# Patient Record
Sex: Female | Born: 1950 | Race: Black or African American | Hispanic: No | Smoking: Former smoker
Health system: Southern US, Community
[De-identification: ages and names within clinical notes are randomized; demographics above are authoritative.]

## PROBLEM LIST (undated history)

## (undated) DIAGNOSIS — M810 Age-related osteoporosis without current pathological fracture: Secondary | ICD-10-CM

## (undated) DIAGNOSIS — F329 Major depressive disorder, single episode, unspecified: Secondary | ICD-10-CM

## (undated) DIAGNOSIS — R29898 Other symptoms and signs involving the musculoskeletal system: Secondary | ICD-10-CM

## (undated) DIAGNOSIS — G8929 Other chronic pain: Secondary | ICD-10-CM

## (undated) DIAGNOSIS — N183 Chronic kidney disease, stage 3 unspecified: Secondary | ICD-10-CM

## (undated) DIAGNOSIS — D472 Monoclonal gammopathy: Secondary | ICD-10-CM

## (undated) DIAGNOSIS — R569 Unspecified convulsions: Secondary | ICD-10-CM

## (undated) DIAGNOSIS — R51 Headache: Secondary | ICD-10-CM

## (undated) DIAGNOSIS — J069 Acute upper respiratory infection, unspecified: Secondary | ICD-10-CM

## (undated) DIAGNOSIS — I10 Essential (primary) hypertension: Secondary | ICD-10-CM

## (undated) DIAGNOSIS — F32A Depression, unspecified: Secondary | ICD-10-CM

## (undated) DIAGNOSIS — J984 Other disorders of lung: Secondary | ICD-10-CM

## (undated) DIAGNOSIS — E785 Hyperlipidemia, unspecified: Secondary | ICD-10-CM

## (undated) DIAGNOSIS — G9340 Encephalopathy, unspecified: Secondary | ICD-10-CM

## (undated) HISTORY — DX: Monoclonal gammopathy: D47.2

## (undated) HISTORY — DX: Other chronic pain: G89.29

## (undated) HISTORY — PX: REDUCTION MAMMAPLASTY: SUR839

## (undated) HISTORY — PX: CHOLECYSTECTOMY: SHX55

## (undated) HISTORY — PX: BREAST REDUCTION SURGERY: SHX8

## (undated) HISTORY — DX: Depression, unspecified: F32.A

## (undated) HISTORY — DX: Headache: R51

## (undated) HISTORY — PX: LUNG LOBECTOMY: SHX167

## (undated) HISTORY — DX: Major depressive disorder, single episode, unspecified: F32.9

## (undated) HISTORY — DX: Acute upper respiratory infection, unspecified: J06.9

## (undated) HISTORY — PX: ABDOMINAL HYSTERECTOMY: SHX81

## (undated) HISTORY — DX: Age-related osteoporosis without current pathological fracture: M81.0

## (undated) HISTORY — PX: APPENDECTOMY: SHX54

## (undated) HISTORY — DX: Unspecified convulsions: R56.9

---

## 2000-11-28 HISTORY — PX: BACK SURGERY: SHX140

## 2010-03-04 ENCOUNTER — Encounter: Payer: Self-pay | Admitting: Cardiology

## 2010-04-12 ENCOUNTER — Encounter: Payer: Self-pay | Admitting: Cardiology

## 2010-04-15 ENCOUNTER — Encounter (INDEPENDENT_AMBULATORY_CARE_PROVIDER_SITE_OTHER): Payer: Self-pay | Admitting: *Deleted

## 2010-04-21 ENCOUNTER — Ambulatory Visit: Payer: Self-pay | Admitting: Cardiology

## 2010-04-21 DIAGNOSIS — R0600 Dyspnea, unspecified: Secondary | ICD-10-CM | POA: Insufficient documentation

## 2010-04-21 DIAGNOSIS — R079 Chest pain, unspecified: Secondary | ICD-10-CM | POA: Insufficient documentation

## 2010-04-21 DIAGNOSIS — I10 Essential (primary) hypertension: Secondary | ICD-10-CM | POA: Insufficient documentation

## 2010-04-21 DIAGNOSIS — R0609 Other forms of dyspnea: Secondary | ICD-10-CM

## 2010-04-22 ENCOUNTER — Telehealth (INDEPENDENT_AMBULATORY_CARE_PROVIDER_SITE_OTHER): Payer: Self-pay | Admitting: *Deleted

## 2010-04-22 LAB — CONVERTED CEMR LAB
BUN: 25 mg/dL — ABNORMAL HIGH (ref 6–23)
CO2: 19 meq/L (ref 19–32)
Calcium: 9.4 mg/dL (ref 8.4–10.5)
Chloride: 108 meq/L (ref 96–112)
Creatinine, Ser: 1.6 mg/dL — ABNORMAL HIGH (ref 0.4–1.2)
GFR calc non Af Amer: 35.1 mL/min (ref >60)
Glucose, Bld: 102 mg/dL — ABNORMAL HIGH (ref 70–99)
Potassium: 4.9 meq/L (ref 3.5–5.1)
Sodium: 136 meq/L (ref 135–145)

## 2010-04-27 LAB — CONVERTED CEMR LAB: Pro B Natriuretic peptide (BNP): 18.8 pg/mL (ref 0.0–100.0)

## 2010-04-30 ENCOUNTER — Ambulatory Visit: Payer: Self-pay | Admitting: Cardiology

## 2010-04-30 LAB — CONVERTED CEMR LAB
BUN: 27 mg/dL — ABNORMAL HIGH (ref 6–23)
CO2: 25 meq/L (ref 19–32)
Calcium: 9.2 mg/dL (ref 8.4–10.5)
Chloride: 109 meq/L (ref 96–112)
Creatinine, Ser: 1.3 mg/dL — ABNORMAL HIGH (ref 0.4–1.2)
GFR calc non Af Amer: 43.07 mL/min (ref >60)
Glucose, Bld: 85 mg/dL (ref 70–99)
Potassium: 4.7 meq/L (ref 3.5–5.1)
Sodium: 142 meq/L (ref 135–145)

## 2010-05-26 ENCOUNTER — Encounter (INDEPENDENT_AMBULATORY_CARE_PROVIDER_SITE_OTHER): Payer: Self-pay | Admitting: *Deleted

## 2010-12-19 ENCOUNTER — Encounter: Payer: Self-pay | Admitting: Cardiovascular Disease

## 2010-12-28 NOTE — Letter (Signed)
Summary: New Patient letter  [facility]  [place], [place] [address]   Phone: [phone]  Fax: [phone]       04/15/2010 MRN: [id number]  [place], [place]  [address]  Dear Ms. CLAYBROOK,  Welcome to the Gastroenterology Division at [facility].    You are scheduled to see Dr.  Ardis Hughs on  05-28-10 at [place] on the 3rd floor at [facility], [place] [employer].  We ask that you try to arrive at our office 15 minutes prior to your appointment time to allow for check-in.  We would like you to complete the enclosed self-administered evaluation form prior to your visit and bring it with you on the day of your appointment.  We will review it with you.  Also, please bring a complete list of all your medications or, if you prefer, bring the medication bottles and we will list them.  Please bring your insurance card so that we may make a copy of it.  If your insurance requires a referral to see a specialist, please bring your referral form from your primary care physician.  Co-payments are due at the time of your visit and may be paid by cash, check or credit card.     Your office visit will consist of a consult with your physician (includes a physical exam), any laboratory testing he/she may order, scheduling of any necessary diagnostic testing (e.g. x-ray, ultrasound, CT-scan), and scheduling of a procedure (e.g. Endoscopy, Colonoscopy) if required.  Please allow enough time on your schedule to allow for any/all of these possibilities.    If you cannot keep your appointment, please call [phone] to cancel or reschedule prior to your appointment date.  This allows [place] the opportunity to schedule an appointment for another patient in need of care.  If you do not cancel or reschedule by 5 p.m. the business day prior to your appointment date, you will be charged a $50.00 late cancellation/no-show fee.    Thank you for choosing [facility] for your medical needs.  We appreciate the opportunity to care for you.  Please visit [place] at our website  to learn more about our practice.                     Sincerely,                                                             The Gastroenterology Division

## 2010-12-28 NOTE — Progress Notes (Signed)
  Phone Note Outgoing Call   Call placed by: Joelyn Oms RN,  Apr 22, 2010 5:05 PM Call placed to: Patient Details for Reason: lab results  Follow-up for Phone Call        I spoke with Ms. [name] about lab results and Dr. Aundra Dubin orders to decrease Chlorthalidone to 25mg  daily.  She will repeat a BMET on Friday 6/3. Joelyn Oms RN

## 2010-12-28 NOTE — Assessment & Plan Note (Signed)
Summary: np6/angina/jml   Referring Provider:  Ronnald Collum Primary Provider:  Mercie Eon  CC:  new patient with chest pain and heaviness on chest.  .  History of Present Illness: 60 yo with history of HTN, hyperlipidemia, and CVA presented for evaluation of chest heaviness and exertional dyspnea.  Patient gets episodes of chest heaviness 3-4 times a week for the last 2 years.  It will "hit" her at random times though often is related to emotional stress.  Sometimes occurs while she is exerting herself but also sometimes occurs at rest.  Heaviness will resolve after 2-3 minutes of relaxation.  It is not associated with meals.  Paitent also has had significant dyspnea with exertion for around 2 years.  She gets some shortness of breath walking around her house.  She is very short of breath with steps.  She may have some orthopnea as she is not comfortable lying flat on her back (sleeps on side with head raised).  She does have some residual right-sided weakness from her stroke so she walks with a cane.   ECG: NSR, possible LAE  Labs (11/10): LDL 53, HDL 76  Current Medications (verified): 1)  Chlorthalidone 50 Mg Tabs (Chlorthalidone) .... Take One Tablet Once Daily 2)  Plavix 75 Mg Tabs (Clopidogrel Bisulfate) .... Once Daily 3)  Zolpidem Tartrate 10 Mg Tabs (Zolpidem Tartrate) .... Take One Tablet Once Daily 4)  Pantoprazole Sodium 40 Mg Tbec (Pantoprazole Sodium) .... Once Daily 5)  Simvastatin 10 Mg Tabs (Simvastatin) .... Once Daily 6)  Lorazepam 1 Mg Tabs (Lorazepam) .... Take One Tablet To 3 Tablets Daily As Needed For Anxiety 7)  Promethazine Hcl 25 Mg Tabs (Promethazine Hcl) .... As Needed For Nausea 8)  Aspirin 81 Mg Tabs (Aspirin) .... Once Daily 9)  Amlodipine Besylate 5 Mg Tabs (Amlodipine Besylate) .... Once Daily 10)  Vit D-2 1.25 Mg .... One Capsule Twice A Week 11)  Levetiracetam 250 Mg Tabs (Levetiracetam) .... Take One Tablet Two Times A Day 12)  Cymbalta 60 Mg Cpep  (Duloxetine Hcl) .... Take One Capsule Once Daily  Allergies (verified): 1)  ! Sulfa 2)  ! Pcn  Past History:  Past Medical History: 1. HTN 2. Seizures 3. Hyperlipidemia 4. Depression 5. CVA: Residual right-sided weakness 6. GERD 7. Partial right lung resection in 1978 in [place] for "cyst in lung."  8. History of back surgery, chronic back pain  Family History: CAD in mother (PCI in her 68s), father with "heart problems"  Social History: Disabled former Psychologist, counselling.  Quit tobacco around 2007 (1 ppd before).  Lives with her aunt.    Review of Systems       All systems reviewed and negative except as per HPI.   Vital Signs:  Patient profile:   60 year old female Height:      67 inches Weight:      209 pounds BMI:     32.85 Pulse rate:   93 / minute Pulse rhythm:   regular BP sitting:   124 / 88  (left arm) Cuff size:   regular  Vitals Entered By: Doug Sou CMA (Apr 21, 2010 3:00 PM)  Physical Exam  General:  Well developed, well nourished, in no acute distress. Head:  normocephalic and atraumatic Nose:  no deformity, discharge, inflammation, or lesions Mouth:  Teeth, gums and palate normal. Oral mucosa normal. Neck:  Neck supple, no JVD. No masses, thyromegaly or abnormal cervical nodes. Lungs:  Clear bilaterally to auscultation and  percussion. Heart:  Non-displaced PMI, chest non-tender; regular rate and rhythm, S1, S2 without rubs or gallops. 1/6 SEM upper sternal border.  Carotid upstroke normal, no bruit.  1+ DP on right, trace DP on left. No edema, no varicosities. Abdomen:  Bowel sounds positive; abdomen soft and non-tender without masses, organomegaly, or hernias noted. No hepatosplenomegaly. Extremities:  No clubbing or cyanosis. Neurologic:  Alert and oriented x 3. Skin:  Intact without lesions or rashes. Psych:  Normal affect.   Impression & Recommendations:  Problem # 1:  CHEST PAIN-UNSPECIFIED (ICD-786.50) Patient has atypical chest  pain that has been a chronic pattern.  She does have quite significant exertional dyspnea.  She has a number of risk factors for CAD including known vascular disease (history of CVA).  I will enroll her in the PROMISE study for coronary CTA or Lexiscan myoview.  Continue Plavix, ASA and statin given known vascular disease (history of stroke).    Problem # 2:  SHORTNESS OF BREATH (ICD-786.05) Patient gets exertional dyspnea with moderate activity.  She does not appear volume overloaded on exam.  ? obesity/deconditioning.  I will get an echocardiogram to assess LV systolic function and to look at the heart valves.  I will get a BNP.   Problem # 3:  HYPERTENSION, UNSPECIFIED (ICD-401.9) Good control.   Other Orders: Echocardiogram (Echo) TLB-BNP (B-Natriuretic Peptide) (83880-BNPR) Cardiac CTA (Cardiac CTA) TLB-BMP (Basic Metabolic Panel-BMET) (99991111)  Patient Instructions: 1)  Research Nurse will talk with you today about the Promise Study. 2)  Your physician has requested that you have an echocardiogram.  Echocardiography is a painless test that uses sound waves to create images of your heart. It provides your doctor with information about the size and shape of your heart and how well your heart's chambers and valves are working.  This procedure takes approximately one hour. There are no restrictions for this procedure. 3)  Your physician recommends that have lab today---BNP 786.09 4)  Your physician recommends that you schedule a follow-up appointment in about 3 weeks with Dr Aundra Dubin.   Appended Document: np6/angina/jml per Dr Orpah Greek to take Metoprolol 25mg  the night before the CCTA and 50mg  the morning of the CCTA--   Clinical Lists Changes  Medications: Added new medication of METOPROLOL TARTRATE 25 MG TABS (METOPROLOL TARTRATE) one tablet the night before the CTA and  two tablets the morning of the CTA - Signed Rx of METOPROLOL TARTRATE 25 MG TABS (METOPROLOL TARTRATE) one  tablet the night before the CTA and  two tablets the morning of the CTA;  #3 x 0;  Signed;  Entered by: Desiree Lucy, RN, BSN;  Authorized by: Loralie Champagne, MD;  Method used: Electronically to [facility] [place] [address], [place], [place]  [address], Ph: [phone] or [phone], Fax: [phone]    Prescriptions: METOPROLOL TARTRATE 25 MG TABS (METOPROLOL TARTRATE) one tablet the night before the CTA and  two tablets the morning of the CTA  #3 x 0   Entered by:   Desiree Lucy, RN, BSN   Authorized by:   Loralie Champagne, MD   Signed by:   Desiree Lucy, RN, BSN on 04/27/2010   Method used:   Electronically to        [facility] (retail)       [address]. [place], [place]  [address]       Ph: [phone] or [phone]       Fax: [phone]  [id number]

## 2010-12-28 NOTE — Progress Notes (Signed)
Summary: [name] Family Office Note  [facility] Family Office Note   Imported By: Sallee Provencal 05/07/2010 12:16:52  _____________________________________________________________________  External Attachment:    Type:   Image     Comment:   External Document

## 2010-12-28 NOTE — Letter (Signed)
Summary: New Patient letter  [facility]  [place], [place] [address]   Phone: [phone]  Fax: [phone]       05/26/2010 MRN: [id number]  [name] [place], [place]  [address]  Dear Ms. CLAYBROOK,  Welcome to the Gastroenterology Division at [facility].    You are scheduled to see Dr.  Ardis Hughs on 07-06-10 at 2:00p.m. on the 3rd floor at [facility], [place] [employer].  We ask that you try to arrive at our office 15 minutes prior to your appointment time to allow for check-in.  We would like you to complete the enclosed self-administered evaluation form prior to your visit and bring it with you on the day of your appointment.  We will review it with you.  Also, please bring a complete list of all your medications or, if you prefer, bring the medication bottles and we will list them.  Please bring your insurance card so that we may make a copy of it.  If your insurance requires a referral to see a specialist, please bring your referral form from your primary care physician.  Co-payments are due at the time of your visit and may be paid by cash, check or credit card.     Your office visit will consist of a consult with your physician (includes a physical exam), any laboratory testing he/she may order, scheduling of any necessary diagnostic testing (e.g. x-ray, ultrasound, CT-scan), and scheduling of a procedure (e.g. Endoscopy, Colonoscopy) if required.  Please allow enough time on your schedule to allow for any/all of these possibilities.    If you cannot keep your appointment, please call [phone] to cancel or reschedule prior to your appointment date.  This allows [place] the opportunity to schedule an appointment for another patient in need of care.  If you do not cancel or reschedule by 5 p.m. the business day prior to your appointment date, you will be charged a $50.00 late cancellation/no-show fee.    Thank you for choosing  [facility] for your medical needs.  We appreciate the opportunity to care for you.  Please visit [place] at our website  to learn more about our practice.                     Sincerely,                                                             The Gastroenterology Division

## 2012-01-31 ENCOUNTER — Ambulatory Visit: Payer: Medicare Other | Attending: Family Medicine | Admitting: Physical Therapy

## 2012-06-13 ENCOUNTER — Encounter: Payer: Self-pay | Admitting: Gastroenterology

## 2012-07-17 ENCOUNTER — Telehealth: Payer: Self-pay | Admitting: *Deleted

## 2012-07-17 NOTE — Telephone Encounter (Signed)
Pt no show for previsit.  No answer on home number.  Sent no show letter.

## 2012-07-26 ENCOUNTER — Encounter: Payer: Self-pay | Admitting: Gastroenterology

## 2012-07-26 ENCOUNTER — Other Ambulatory Visit: Payer: Self-pay | Admitting: Family Medicine

## 2012-07-26 DIAGNOSIS — N289 Disorder of kidney and ureter, unspecified: Secondary | ICD-10-CM

## 2012-07-31 ENCOUNTER — Ambulatory Visit (HOSPITAL_COMMUNITY)
Admission: RE | Admit: 2012-07-31 | Discharge: 2012-07-31 | Disposition: A | Discharge status: Home / Self Care | Payer: Medicare Other | Source: Ambulatory Visit | Attending: Family Medicine | Admitting: Family Medicine

## 2012-07-31 DIAGNOSIS — N289 Disorder of kidney and ureter, unspecified: Secondary | ICD-10-CM

## 2012-11-01 ENCOUNTER — Other Ambulatory Visit: Payer: Self-pay | Admitting: Family Medicine

## 2012-11-01 DIAGNOSIS — Z9889 Other specified postprocedural states: Secondary | ICD-10-CM

## 2012-11-01 DIAGNOSIS — L905 Scar conditions and fibrosis of skin: Secondary | ICD-10-CM

## 2012-11-05 ENCOUNTER — Encounter (HOSPITAL_COMMUNITY): Payer: Medicare Other | Attending: Oncology | Admitting: Oncology

## 2012-11-05 ENCOUNTER — Encounter (HOSPITAL_COMMUNITY): Payer: Self-pay | Admitting: Oncology

## 2012-11-05 VITALS — BP 116/78 | HR 58 | Temp 98.1°F | Resp 20 | Ht 66.0 in | Wt 185.0 lb

## 2012-11-05 DIAGNOSIS — I10 Essential (primary) hypertension: Secondary | ICD-10-CM | POA: Insufficient documentation

## 2012-11-05 DIAGNOSIS — E7800 Pure hypercholesterolemia, unspecified: Secondary | ICD-10-CM | POA: Insufficient documentation

## 2012-11-05 DIAGNOSIS — R803 Bence Jones proteinuria: Secondary | ICD-10-CM

## 2012-11-05 DIAGNOSIS — G40909 Epilepsy, unspecified, not intractable, without status epilepticus: Secondary | ICD-10-CM | POA: Insufficient documentation

## 2012-11-05 DIAGNOSIS — N289 Disorder of kidney and ureter, unspecified: Secondary | ICD-10-CM | POA: Insufficient documentation

## 2012-11-05 DIAGNOSIS — R809 Proteinuria, unspecified: Secondary | ICD-10-CM | POA: Insufficient documentation

## 2012-11-05 NOTE — Progress Notes (Signed)
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 Lowanda Foster. She is referred here for workup of Bence-Jones proteinuria. 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 This dates from 08/02/2012. She denies nausea or vomiting but has a history of worsening back pain for the last 2 years. She had back surgery 11 years ago with nice improvement for a number of years. She thinks she had an MRI of her back 3 years ago at [facility] when she was seen for evaluation of seizures. She started losing her appetite 11-10 months ago. She weighed approximately 250-270 pounds at that time. She desires no food. She is not having fevers, chills, etc. She is not aware of any lumps anywhere. She is due for mammography in the near future. She has 3 children, 2 sons and one daughter. One son she states has significant medical issues. Her parents are deceased. She is presently living with a cousin. She has been married but is divorced. She is on disability since her back operation uses a 4 pronged cane daily.  BP 116/78  Pulse 58  Temp 98.1 F (36.7 C) (Oral)  Resp 20  Ht 5\' 6"  (1.676 m)  Wt 185 lb (83.915 kg)  BMI 29.86 kg/m2  She is  alert and oriented. She is in no acute distress. She has no lymphadenopathy. Shows no thyromegaly. Breast surgical changes are obvious without masses. Right chest scar is well healed. Lungs are clear. She did smoke at one time for proximal be 22 years one half pack of cigarettes a day. Heart shows a regular rhythm and rate. There is no S3 gallop or murmur. Numerous teeth are missing. Tongue is normal in the midline. Pupils are equally round and reactive to light and she appears to have cataracts bilaterally. Abdomen is soft and nontender without hepatosplenomegaly. She does have a scar which is well-healed. Shows no leg edema no arm edema. She has no abnormal skin lesions. Pulses are 1+ and symmetrical. She is right handed. She follows all commands easily.  She needs blood work, repeat urine for 24-hours, bone survey, bone marrow aspiration and biopsy. We will see her back right after that.

## 2012-11-05 NOTE — Patient Instructions (Addendum)
[facility]  Discharge Instructions  RECOMMENDATIONS MADE BY THE CONSULTANT AND ANY TEST RESULTS WILL BE SENT TO YOUR REFERRING DOCTOR.   EXAM FINDINGS BY MD TODAY AND SIGNS AND SYMPTOMS TO REPORT TO CLINIC OR PRIMARY MD:  Exam today per Dr. Tressie Stalker  Bone marrow biopsy and aspirate to be done by interventional radiology. I will call you with the date and time  INSTRUCTIONS GIVEN AND DISCUSSED: You will need to collect your urine for 24 hrs then bring it in to [place] on Wednesday the 18th when you come for your mammogram. Come to 4th floor at 9am and have lab drawn then to xray for mammogram and bone survey.   SPECIAL INSTRUCTIONS/FOLLOW-UP: Dr. Tressie Stalker will see you back 2 weeks after the bone marrow biopsy.    I acknowledge that I have been informed and understand all the instructions given to me and received a copy. I do not have any more questions at this time, but understand that I may call the Specialty Clinic at [facility] at [phone] during business hours should I have any further questions or need assistance in obtaining follow-up care.    __________________________________________  _____________  __________ Signature of Patient or Authorized Representative            Date                   Time    __________________________________________ Nurse's Signature

## 2012-11-07 ENCOUNTER — Encounter (HOSPITAL_COMMUNITY): Payer: Self-pay | Admitting: Pharmacy Technician

## 2012-11-12 ENCOUNTER — Other Ambulatory Visit: Payer: Self-pay | Admitting: Radiology

## 2012-11-13 ENCOUNTER — Ambulatory Visit (HOSPITAL_COMMUNITY)
Admission: RE | Admit: 2012-11-13 | Discharge: 2012-11-13 | Disposition: A | Discharge status: Home / Self Care | Payer: Medicare Other | Source: Ambulatory Visit | Attending: Oncology | Admitting: Oncology

## 2012-11-13 ENCOUNTER — Other Ambulatory Visit (HOSPITAL_COMMUNITY): Payer: Self-pay | Admitting: Oncology

## 2012-11-13 ENCOUNTER — Encounter (HOSPITAL_COMMUNITY): Payer: Self-pay

## 2012-11-13 ENCOUNTER — Encounter (HOSPITAL_COMMUNITY): Payer: Medicare Other | Admitting: Oncology

## 2012-11-13 DIAGNOSIS — R803 Bence Jones proteinuria: Secondary | ICD-10-CM

## 2012-11-13 DIAGNOSIS — Z79899 Other long term (current) drug therapy: Secondary | ICD-10-CM | POA: Insufficient documentation

## 2012-11-13 DIAGNOSIS — I10 Essential (primary) hypertension: Secondary | ICD-10-CM | POA: Insufficient documentation

## 2012-11-13 DIAGNOSIS — R809 Proteinuria, unspecified: Secondary | ICD-10-CM | POA: Insufficient documentation

## 2012-11-13 LAB — CBC
HCT: 37.5 % (ref 36.0–46.0)
Hemoglobin: 12.1 g/dL (ref 12.0–15.0)
MCH: 30.6 pg (ref 26.0–34.0)
MCHC: 32.3 g/dL (ref 30.0–36.0)
MCV: 94.9 fL (ref 78.0–100.0)
Platelets: 374 10*3/uL (ref 150–400)
RBC: 3.95 MIL/uL (ref 3.87–5.11)
RDW: 12.5 % (ref 11.5–15.5)
WBC: 5.9 10*3/uL (ref 4.0–10.5)

## 2012-11-13 LAB — PROTIME-INR
INR: 1.09 (ref 0.00–1.49)
Prothrombin Time: 14 seconds (ref 11.6–15.2)

## 2012-11-13 LAB — APTT: aPTT: 40 seconds — ABNORMAL HIGH (ref 24–37)

## 2012-11-13 MED ORDER — SODIUM CHLORIDE 0.9 % IV SOLN
INTRAVENOUS | Status: DC
  Administered 2012-11-13: 09:00:00 via INTRAVENOUS

## 2012-11-13 MED ORDER — FENTANYL CITRATE 0.05 MG/ML IJ SOLN
INTRAMUSCULAR | Status: AC | PRN
  Administered 2012-11-13: 100 ug via INTRAVENOUS

## 2012-11-13 MED ORDER — MIDAZOLAM HCL 2 MG/2ML IJ SOLN
INTRAMUSCULAR | Status: AC | PRN
  Administered 2012-11-13: 2 mg via INTRAVENOUS

## 2012-11-13 MED ORDER — FENTANYL CITRATE 0.05 MG/ML IJ SOLN
INTRAMUSCULAR | Status: AC
  Filled 2012-11-13: qty 4

## 2012-11-13 MED ORDER — MIDAZOLAM HCL 2 MG/2ML IJ SOLN
INTRAMUSCULAR | Status: AC
  Filled 2012-11-13: qty 4

## 2012-11-13 NOTE — Procedures (Signed)
CT bone marrow Bx No comp

## 2012-11-13 NOTE — Progress Notes (Signed)
Patient ambulated in hallway and used the restroom. Patient denied SOB, pain and dizziness.

## 2012-11-13 NOTE — Procedures (Signed)
Successful 4Fr micropuncture IV to Rt brachial vein. Ready for use.

## 2012-11-13 NOTE — H&P (Signed)
Chief Complaint: "I'm here for a bone marrow biopsy" Referring Physician:Neijstrom HPI: [name] is an 61 y.o. female who is in process of workup after some abnormal blood work. She has Bence-Jones proteinuria and is referred for a bone marrow biopsy to rule out myeloma. She has never had one before. PMHx and meds reviewed as below.   Past Medical History:  Past Medical History  Diagnosis Date  . Renal insufficiency   . Seizures   . Chronic headaches   . Hypertension     Past Surgical History:  Past Surgical History  Procedure Date  . Breast reduction surgery   . Abdominal hysterectomy   . Lung lobectomy     rt lung-cyst   . Appendectomy   . Cholecystectomy   . Back surgery 2002    Family History: History reviewed. No pertinent family history.  Social History:  reports that she quit smoking today. Her smoking use included Cigarettes. She has a 30 pack-year smoking history. She has never used smokeless tobacco. She reports that she does not drink alcohol or use illicit drugs.  Allergies:  Allergies  Allergen Reactions  . Penicillins Hives  . Sulfonamide Derivatives Hives    Medications: amLODipine (NORVASC) 5 MG tablet (Taking) Sig - Route: Take 5 mg by mouth every morning. - Oral Class: Historical Med Number of times this order has been changed since signing: 3 [address] aspirin 81 MG tablet (Taking) Sig - Route: Take 81 mg by mouth daily. - Oral Class: Historical Med Number of times this order has been changed since signing: 2 [address] cholecalciferol (VITAMIN D) 1000 UNITS tablet (Taking) Sig - Route: Take 1,000 Units by mouth daily. - Oral Class: Historical Med Number of times this order has been changed since signing: 2 [address] DULoxetine (CYMBALTA) 30 MG capsule (Taking) Sig - Route: Take 90 mg by mouth every morning. - Oral Class: Historical Med Number of times this order has been changed since signing: 4 Order [address] levETIRAcetam  (KEPPRA) 250 MG tablet (Taking) Sig - Route: Take 250 mg by mouth every 12 (twelve) hours. - Oral Class: Historical Med Number of times this order has been changed since signing: 2 [address] pantoprazole (PROTONIX) 40 MG tablet (Taking) Sig - Route: Take 40 mg by mouth daily. - Oral Class: Historical Med Number of times this order has been changed since signing: 2 Order [address] simvastatin (ZOCOR) 10 MG tablet (Taking) Sig - Route: Take 10 mg by mouth at bedtime. - Oral Class: Historical Med Number of times this order has been changed since signing: 2 Order [address] topiramate (TOPAMAX) 100 MG tablet (Taking) Sig - Route: Take 100 mg by mouth 2 (two) times daily. - Oral Class: Historical Med Number of times this order has been changed since signing: 2 Order [address] traMADol (ULTRAM) 50 MG tablet (Taking) Sig - Route: Take 50 mg by mouth every 6 (six) hours as needed. - Oral Class: Historical Med Number of times this order has been changed since signing: 2 Order [address] zolpidem (AMBIEN) 10 MG tablet (Taking) Sig - Route: Take 10 mg by mouth at bedtime as needed. For sleep    Please HPI for pertinent positives, otherwise complete 10 system ROS negative.  Physical Exam: Blood pressure 129/58, pulse 78, temperature 98.4 F (36.9 C), temperature source Oral, resp. rate 16, height 5\' 6"  (1.676 m), weight 185 lb (83.915 kg), SpO2 99.00%. Body mass index is 29.86 kg/(m^2).  General Appearance:  Alert, cooperative, no distress, appears stated age  Head:  Normocephalic, without obvious abnormality, atraumatic  ENT: Unremarkable  Neck: Supple, symmetrical, trachea midline, no adenopathy, thyroid: not enlarged, symmetric, no tenderness/mass/nodules  Lungs:   Clear to auscultation bilaterally, no w/r/r, respirations unlabored without use of accessory muscles.  Chest Wall:  No tenderness or deformity  Heart:  Regular rate and rhythm, S1, S2 normal, no murmur, rub or gallop. Carotids 2+  without bruit.  Neurologic: Normal affect, no gross deficits.   No results found for this or any previous visit (from the past 48 hour(s)). No results found.  Assessment/Plan Bence-Jones proteinuria For CT guided BM biopsy with sedation Discussed procedure and risks Labs pending Consent signed in chart  Ascencion Dike PA-C 11/13/2012, 8:26 AM

## 2012-11-14 ENCOUNTER — Encounter (HOSPITAL_COMMUNITY): Payer: Medicare Other

## 2012-11-14 ENCOUNTER — Ambulatory Visit (HOSPITAL_COMMUNITY)
Admission: RE | Admit: 2012-11-14 | Discharge: 2012-11-14 | Disposition: A | Discharge status: Home / Self Care | Payer: Medicare Other | Source: Ambulatory Visit | Attending: Oncology | Admitting: Oncology

## 2012-11-14 ENCOUNTER — Other Ambulatory Visit (HOSPITAL_COMMUNITY): Payer: Self-pay | Admitting: Oncology

## 2012-11-14 ENCOUNTER — Encounter (HOSPITAL_BASED_OUTPATIENT_CLINIC_OR_DEPARTMENT_OTHER): Payer: Medicare Other

## 2012-11-14 DIAGNOSIS — M47817 Spondylosis without myelopathy or radiculopathy, lumbosacral region: Secondary | ICD-10-CM | POA: Insufficient documentation

## 2012-11-14 DIAGNOSIS — R803 Bence Jones proteinuria: Secondary | ICD-10-CM

## 2012-11-14 DIAGNOSIS — R809 Proteinuria, unspecified: Secondary | ICD-10-CM | POA: Insufficient documentation

## 2012-11-14 DIAGNOSIS — M47814 Spondylosis without myelopathy or radiculopathy, thoracic region: Secondary | ICD-10-CM | POA: Insufficient documentation

## 2012-11-14 DIAGNOSIS — M47812 Spondylosis without myelopathy or radiculopathy, cervical region: Secondary | ICD-10-CM | POA: Insufficient documentation

## 2012-11-14 LAB — COMPREHENSIVE METABOLIC PANEL
ALT: 18 U/L (ref 0–35)
AST: 15 U/L (ref 0–37)
Albumin: 3.8 g/dL (ref 3.5–5.2)
Alkaline Phosphatase: 127 U/L — ABNORMAL HIGH (ref 39–117)
BUN: 29 mg/dL — ABNORMAL HIGH (ref 6–23)
CO2: 22 mEq/L (ref 19–32)
Calcium: 9.2 mg/dL (ref 8.4–10.5)
Chloride: 107 mEq/L (ref 96–112)
Creatinine, Ser: 1.41 mg/dL — ABNORMAL HIGH (ref 0.50–1.10)
GFR calc Af Amer: 46 mL/min — ABNORMAL LOW (ref >90)
GFR calc non Af Amer: 39 mL/min — ABNORMAL LOW (ref >90)
Glucose, Bld: 110 mg/dL — ABNORMAL HIGH (ref 70–99)
Potassium: 4.5 mEq/L (ref 3.5–5.1)
Sodium: 137 mEq/L (ref 135–145)
Total Bilirubin: 0.2 mg/dL — ABNORMAL LOW (ref 0.3–1.2)
Total Protein: 7.7 g/dL (ref 6.0–8.3)

## 2012-11-14 LAB — CBC WITH DIFFERENTIAL/PLATELET
Basophils Absolute: 0.1 10*3/uL (ref 0.0–0.1)
Basophils Relative: 1 % (ref 0–1)
Eosinophils Absolute: 0.4 10*3/uL (ref 0.0–0.7)
Eosinophils Relative: 8 % — ABNORMAL HIGH (ref 0–5)
HCT: 37.1 % (ref 36.0–46.0)
Hemoglobin: 11.9 g/dL — ABNORMAL LOW (ref 12.0–15.0)
Lymphocytes Relative: 23 % (ref 12–46)
Lymphs Abs: 1.3 10*3/uL (ref 0.7–4.0)
MCH: 31.2 pg (ref 26.0–34.0)
MCHC: 32.1 g/dL (ref 30.0–36.0)
MCV: 97.4 fL (ref 78.0–100.0)
Monocytes Absolute: 0.3 10*3/uL (ref 0.1–1.0)
Monocytes Relative: 6 % (ref 3–12)
Neutro Abs: 3.5 10*3/uL (ref 1.7–7.7)
Neutrophils Relative %: 62 % (ref 43–77)
Platelets: 346 10*3/uL (ref 150–400)
RBC: 3.81 MIL/uL — ABNORMAL LOW (ref 3.87–5.11)
RDW: 12.8 % (ref 11.5–15.5)
WBC: 5.6 10*3/uL (ref 4.0–10.5)

## 2012-11-14 LAB — PROTEIN, URINE, 24 HOUR
Collection Interval-UPROT: 24 hours
Protein, 24H Urine: 60 mg/d (ref 50–100)
Protein, Urine: 8 mg/dL
Urine Total Volume-UPROT: 750 mL

## 2012-11-14 LAB — CREATININE CLEARANCE, URINE, 24 HOUR
Collection Interval-CRCL: 24 hours
Creatinine Clearance: 41 mL/min — ABNORMAL LOW (ref 75–115)
Creatinine, 24H Ur: 841 mg/d (ref 700–1800)
Creatinine, Urine: 112.09 mg/dL
Creatinine: 1.41 mg/dL — ABNORMAL HIGH (ref 0.50–1.10)
Urine Total Volume-CRCL: 750 mL

## 2012-11-14 MED ORDER — SODIUM CHLORIDE 0.9 % IJ SOLN
INTRAMUSCULAR | Status: AC
  Filled 2012-11-14: qty 20

## 2012-11-14 NOTE — Progress Notes (Signed)
[name] presented for midline catheter line lab draw and flush. No blood return noted from midline catheter in rt arm. Peripheral stick lt arm . Good blood return present. mid line flushed with 81ml NS and d/c ed per protocol. Procedure without incident. Patient tolerated procedure well.

## 2012-11-14 NOTE — Progress Notes (Signed)
Labs drawn today for Kllc,Visc,mm panel,B2Mic,cbc/diff,cmp,24 hour protein elect,crea clearance

## 2012-11-15 LAB — KAPPA/LAMBDA LIGHT CHAINS
Kappa free light chain: 5.16 mg/dL — ABNORMAL HIGH (ref 0.33–1.94)
Kappa, lambda light chain ratio: 1.91 — ABNORMAL HIGH (ref 0.26–1.65)
Lambda free light chains: 2.7 mg/dL — ABNORMAL HIGH (ref 0.57–2.63)

## 2012-11-15 LAB — BETA 2 MICROGLOBULIN, SERUM: Beta-2 Microglobulin: 3.13 mg/L — ABNORMAL HIGH (ref 1.01–1.73)

## 2012-11-16 LAB — UIFE/LIGHT CHAINS/TP QN, 24-HR UR
Albumin, U: DETECTED
Alpha 1, Urine: DETECTED — AB
Alpha 2, Urine: DETECTED — AB
Beta, Urine: DETECTED — AB
Free Kappa Lt Chains,Ur: 6.02 mg/dL — ABNORMAL HIGH (ref 0.14–2.42)
Free Kappa/Lambda Ratio: 12.54 ratio — ABNORMAL HIGH (ref 2.04–10.37)
Free Lambda Excretion/Day: 3.6 mg/d
Free Lambda Lt Chains,Ur: 0.48 mg/dL (ref 0.02–0.67)
Free Lt Chn Excr Rate: 45.15 mg/d
Gamma Globulin, Urine: DETECTED — AB
Time: 24 hours
Total Protein, Urine-Ur/day: 56 mg/d (ref 10–140)
Total Protein, Urine: 7.4 mg/dL
Volume, Urine: 750 mL

## 2012-11-16 LAB — VISCOSITY, SERUM: Viscosity, Serum: 1.6 rel to H2O (ref 1.5–1.9)

## 2012-11-19 LAB — MULTIPLE MYELOMA PANEL, SERUM
Albumin ELP: 54.1 % — ABNORMAL LOW (ref 55.8–66.1)
Alpha-1-Globulin: 4.5 % (ref 2.9–4.9)
Alpha-2-Globulin: 10.3 % (ref 7.1–11.8)
Beta 2: 5.9 % (ref 3.2–6.5)
Beta Globulin: 5.9 % (ref 4.7–7.2)
Gamma Globulin: 19.3 % — ABNORMAL HIGH (ref 11.1–18.8)
IgA: 279 mg/dL (ref 69–380)
IgG (Immunoglobin G), Serum: 1520 mg/dL (ref 690–1700)
IgM, Serum: 218 mg/dL (ref 52–322)
M-Spike, %: 0.52 g/dL
Total Protein: 7.3 g/dL (ref 6.0–8.3)

## 2012-11-23 ENCOUNTER — Ambulatory Visit (HOSPITAL_COMMUNITY)
Admission: RE | Admit: 2012-11-23 | Discharge: 2012-11-23 | Disposition: A | Discharge status: Home / Self Care | Payer: Medicare Other | Source: Ambulatory Visit | Attending: Family Medicine | Admitting: Family Medicine

## 2012-11-23 ENCOUNTER — Other Ambulatory Visit: Payer: Self-pay | Admitting: Family Medicine

## 2012-11-23 DIAGNOSIS — Z1231 Encounter for screening mammogram for malignant neoplasm of breast: Secondary | ICD-10-CM | POA: Insufficient documentation

## 2012-11-23 DIAGNOSIS — L905 Scar conditions and fibrosis of skin: Secondary | ICD-10-CM

## 2012-11-23 DIAGNOSIS — Z9889 Other specified postprocedural states: Secondary | ICD-10-CM

## 2012-11-30 ENCOUNTER — Ambulatory Visit (HOSPITAL_COMMUNITY): Payer: Medicare Other | Admitting: Oncology

## 2012-12-04 ENCOUNTER — Encounter (HOSPITAL_COMMUNITY): Payer: Medicare Other | Attending: Oncology | Admitting: Oncology

## 2012-12-04 VITALS — BP 124/62 | HR 57 | Temp 97.6°F | Resp 16 | Wt 188.8 lb

## 2012-12-04 DIAGNOSIS — D472 Monoclonal gammopathy: Secondary | ICD-10-CM

## 2012-12-04 NOTE — Progress Notes (Signed)
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 She had only 4% plasma cells with normal cytogenetics on her bone marrow aspiration and biopsy. She therefore just needs to be observed. We will see her 6 months for blood work and a 24 urine for protein and creatinine clearance protein electrophoresis She will not need x-rays until 1-2 years now my opinion. We will in detail over this disorder and her aunt was with her. I have asked her to keep her weight is at least stable or going down. She has trouble exercising she states with her back but I have asked her to discuss this with her back Dr. and her PCP. We will see her in 6 months after the lab work.

## 2012-12-04 NOTE — Patient Instructions (Signed)
[facility] Discharge Instructions  RECOMMENDATIONS MADE BY THE CONSULTANT AND ANY TEST RESULTS WILL BE SENT TO YOUR REFERRING PHYSICIAN.  EXAM FINDINGS BY THE PHYSICIAN TODAY AND SIGNS OR SYMPTOMS TO REPORT TO CLINIC OR PRIMARY PHYSICIAN: Exam findings as discussed by Dr. Tressie Stalker.  SPECIAL INSTRUCTIONS/FOLLOW-UP: 1.  Please keep your scheduled appointments for a 6 month return for labs and to see T. Kefalas, PA-C.  Call us sooner should you have questions.  Thank you for choosing [facility] to provide your oncology and hematology care.  To afford each patient quality time with our providers, please arrive at least 15 minutes before your scheduled appointment time.  With your help, our goal is to use those 15 minutes to complete the necessary work-up to ensure our physicians have the information they need to help with your evaluation and healthcare recommendations.    Effective January 1st, 2014, we ask that you re-schedule your appointment with our physicians should you arrive 10 or more minutes late for your appointment.  We strive to give you quality time with our providers, and arriving late affects you and other patients whose appointments are after yours.    Again, thank you for choosing [facility].  Our hope is that these requests will decrease the amount of time that you wait before being seen by our physicians.       _____________________________________________________________  Should you have questions after your visit to [facility], please contact our office at [phone] between the hours of 8:30 a.m. and 5:00 p.m.  Voicemails left after 4:30 p.m. will not be returned until the following business day.  For prescription refill requests, have your pharmacy contact our office with your prescription refill request.

## 2012-12-21 ENCOUNTER — Encounter: Payer: Self-pay | Admitting: Oncology

## 2012-12-28 ENCOUNTER — Encounter: Payer: Self-pay | Admitting: Oncology

## 2012-12-29 ENCOUNTER — Emergency Department (HOSPITAL_COMMUNITY)
Admission: EM | Admit: 2012-12-29 | Discharge: 2012-12-29 | Disposition: A | Discharge status: Home / Self Care | Payer: Medicare Other | Attending: Emergency Medicine | Admitting: Emergency Medicine

## 2012-12-29 ENCOUNTER — Emergency Department (HOSPITAL_COMMUNITY): Payer: Medicare Other

## 2012-12-29 ENCOUNTER — Encounter (HOSPITAL_COMMUNITY): Payer: Self-pay | Admitting: *Deleted

## 2012-12-29 DIAGNOSIS — M79609 Pain in unspecified limb: Secondary | ICD-10-CM | POA: Insufficient documentation

## 2012-12-29 DIAGNOSIS — Z87448 Personal history of other diseases of urinary system: Secondary | ICD-10-CM | POA: Insufficient documentation

## 2012-12-29 DIAGNOSIS — M545 Low back pain, unspecified: Secondary | ICD-10-CM | POA: Insufficient documentation

## 2012-12-29 DIAGNOSIS — Z7982 Long term (current) use of aspirin: Secondary | ICD-10-CM | POA: Insufficient documentation

## 2012-12-29 DIAGNOSIS — G40909 Epilepsy, unspecified, not intractable, without status epilepticus: Secondary | ICD-10-CM | POA: Insufficient documentation

## 2012-12-29 DIAGNOSIS — Z902 Acquired absence of lung [part of]: Secondary | ICD-10-CM | POA: Insufficient documentation

## 2012-12-29 DIAGNOSIS — R531 Weakness: Secondary | ICD-10-CM

## 2012-12-29 DIAGNOSIS — G8929 Other chronic pain: Secondary | ICD-10-CM | POA: Insufficient documentation

## 2012-12-29 DIAGNOSIS — I10 Essential (primary) hypertension: Secondary | ICD-10-CM | POA: Insufficient documentation

## 2012-12-29 DIAGNOSIS — Z79899 Other long term (current) drug therapy: Secondary | ICD-10-CM | POA: Insufficient documentation

## 2012-12-29 DIAGNOSIS — R51 Headache: Secondary | ICD-10-CM | POA: Insufficient documentation

## 2012-12-29 DIAGNOSIS — Z87891 Personal history of nicotine dependence: Secondary | ICD-10-CM | POA: Insufficient documentation

## 2012-12-29 DIAGNOSIS — M79606 Pain in leg, unspecified: Secondary | ICD-10-CM

## 2012-12-29 DIAGNOSIS — Z9889 Other specified postprocedural states: Secondary | ICD-10-CM | POA: Insufficient documentation

## 2012-12-29 DIAGNOSIS — Z9089 Acquired absence of other organs: Secondary | ICD-10-CM | POA: Insufficient documentation

## 2012-12-29 LAB — CBC WITH DIFFERENTIAL/PLATELET
Basophils Absolute: 0.1 10*3/uL (ref 0.0–0.1)
Basophils Relative: 2 % — ABNORMAL HIGH (ref 0–1)
Eosinophils Absolute: 0.2 10*3/uL (ref 0.0–0.7)
Eosinophils Relative: 5 % (ref 0–5)
HCT: 39.2 % (ref 36.0–46.0)
Hemoglobin: 12.8 g/dL (ref 12.0–15.0)
Lymphocytes Relative: 37 % (ref 12–46)
Lymphs Abs: 1.6 10*3/uL (ref 0.7–4.0)
MCH: 31.7 pg (ref 26.0–34.0)
MCHC: 32.7 g/dL (ref 30.0–36.0)
MCV: 97 fL (ref 78.0–100.0)
Monocytes Absolute: 0.3 10*3/uL (ref 0.1–1.0)
Monocytes Relative: 7 % (ref 3–12)
Neutro Abs: 2.1 10*3/uL (ref 1.7–7.7)
Neutrophils Relative %: 49 % (ref 43–77)
Platelets: 327 10*3/uL (ref 150–400)
RBC: 4.04 MIL/uL (ref 3.87–5.11)
RDW: 12.3 % (ref 11.5–15.5)
WBC: 4.2 10*3/uL (ref 4.0–10.5)

## 2012-12-29 MED ORDER — OXYCODONE-ACETAMINOPHEN 5-325 MG PO TABS: 1.0000 | ORAL_TABLET | Freq: Three times a day (TID) | ORAL | Status: DC | PRN

## 2012-12-29 MED ORDER — OXYCODONE-ACETAMINOPHEN 5-325 MG PO TABS
1.0000 | ORAL_TABLET | Freq: Once | ORAL | Status: AC
  Administered 2012-12-29: 1 via ORAL
  Filled 2012-12-29: qty 1

## 2012-12-29 MED ORDER — SODIUM CHLORIDE 0.9 % IV BOLUS (SEPSIS)
1000.0000 mL | Freq: Once | INTRAVENOUS | Status: AC
  Administered 2012-12-29: 1000 mL via INTRAVENOUS

## 2012-12-29 NOTE — ED Provider Notes (Signed)
History   This chart was scribed for Vanessa Muskrat, MD by Shona Needles, ED Scribe. The patient was seen in room APA07/APA07. Patient's care was started at 1248.  CSN: [id number]  Arrival date & time 12/29/12  1248   First MD Initiated Contact with Patient 12/29/12 1324      Chief Complaint  Patient presents with  . Fall  . Weakness    The history is provided by the patient. No language interpreter was used.    Machaela Gratton is a 62 y.o. female with a h/o leg length discrepancy, HTN, and renal insufficiency, who presents to the Emergency Department complaining of 2 months of new, gradual onset, progressive, constant, moderate right leg pain. Pain is 10/10, worse when bearing weight, and alleviated by nothing. Pt reports no relief to pain despite taking Tramadol. No loss sensation, incontinance, or rash. Surgical Hx includes back surgery, cholecystectomy, lung lobectomy,    Past Medical History  Diagnosis Date  . Renal insufficiency   . Seizures   . Chronic headaches   . Hypertension     Past Surgical History  Procedure Date  . Breast reduction surgery   . Abdominal hysterectomy   . Lung lobectomy     rt lung-cyst   . Appendectomy   . Cholecystectomy   . Back surgery 2002    No family history on file.  History  Substance Use Topics  . Smoking status: Former Smoker -- 1.0 packs/day for 30 years    Types: Cigarettes    Quit date: 11/13/2012  . Smokeless tobacco: Never Used  . Alcohol Use: No   Review of Systems  Constitutional:       Per HPI, otherwise negative  HENT:       Per HPI, otherwise negative  Eyes: Negative.   Respiratory:       Per HPI, otherwise negative  Cardiovascular:       Per HPI, otherwise negative  Gastrointestinal: Negative for vomiting.  Genitourinary: Negative.   Musculoskeletal:       Per HPI, otherwise negative  Skin: Negative.   Neurological: Positive for weakness. Negative for syncope.    Allergies  Penicillins and  Sulfonamide derivatives  Home Medications   Current Outpatient Rx  Name  Route  Sig  Dispense  Refill  . AMLODIPINE BESYLATE 5 MG PO TABS   Oral   Take 5 mg by mouth every morning.          . ASPIRIN 81 MG PO TABS   Oral   Take 81 mg by mouth daily.         . ATENOLOL 100 MG PO TABS   Oral   Take 100 mg by mouth at bedtime.         [name] VITAMIN D 1000 UNITS PO TABS   Oral   Take 1,000 Units by mouth daily.         . DULOXETINE HCL 30 MG PO CPEP   Oral   Take 90 mg by mouth every morning.          [name] LEVETIRACETAM 250 MG PO TABS   Oral   Take 250 mg by mouth every 12 (twelve) hours.         [name] LIDOCAINE HCL 4 % EX SOLN   Topical   Apply 2 mLs topically daily as needed. Apply a cotton ball amount to migraine area         . PANTOPRAZOLE SODIUM 40 MG PO TBEC   Oral  Take 40 mg by mouth daily.         [name] SIMVASTATIN 10 MG PO TABS   Oral   Take 10 mg by mouth at bedtime.         . TOPIRAMATE 100 MG PO TABS   Oral   Take 100 mg by mouth 2 (two) times daily.         . TRAMADOL HCL 50 MG PO TABS   Oral   Take 50 mg by mouth every 6 (six) hours as needed.         [name] ZOLPIDEM TARTRATE 10 MG PO TABS   Oral   Take 10 mg by mouth at bedtime as needed. For sleep           BP 114/51  Pulse 61  Temp 97.9 F (36.6 C) (Oral)  Resp 16  Ht 5\' 6"  (1.676 m)  Wt 187 lb (84.823 kg)  BMI 30.18 kg/m2  SpO2 100%  Physical Exam  Nursing note and vitals reviewed. Constitutional: She is oriented to person, place, and time. She appears well-developed and well-nourished. No distress.  HENT:  Head: Normocephalic and atraumatic.  Eyes: Conjunctivae normal and EOM are normal.  Cardiovascular: Normal rate and regular rhythm.   Pulmonary/Chest: Effort normal and breath sounds normal. No stridor. No respiratory distress.  Abdominal: She exhibits no distension.  Musculoskeletal: She exhibits no edema.       Right paraspinal, upper lumbar pain. Right hip  tenderness to palpation. Anterior interior iliac crest pain. Strength is 4/5 knee extension and flexion. Symmetric strength bilaterally. Good reflexes distally.  Neurological: She is alert and oriented to person, place, and time. No cranial nerve deficit.  Skin: Skin is warm and dry.  Psychiatric: She has a normal mood and affect.    ED Course  Procedures DIAGNOSTIC STUDIES: Oxygen Saturation is 100% on room air, normal by my interpretation.    COORDINATION OF CARE: 13:31- Evaluated Pt. Pt is awake, alert, and without distress. 13:37- Patient understand and agree with initial ED impression and plan with expectations set for ED visit.   Labs Reviewed - No data to display No results found.   No diagnosis found.  3:01 PM Patient informed of XR results.  On chart review, it seems as though the patient has myeloma - blood work ordered.  3:33 PM Patient now describes weakness throughout her R side for the past week, with no speech difficulty.  She also endorses HA for the same amount of time.  MDM   I personally performed the services described in this documentation, which was scribed in my presence. The recorded information has been reviewed and is accurate.   This pleasant elderly female with multiple medical problems, including recently diagnosed myeloma, early in evaluation, now presents with right leg pain, generalized weakness, and throughout this encounter has several other new complaints.  On exam the patient is mentating clearly, with no focal deficits.  Though she is deconditioned, and clearly has illness, her evaluation here was not remarkable for finding acute changes.  I discussed at length, with the patient, her hips, her daughter, all findings, the need for prompt followup for continued evaluation and management of her condition.  Vanessa Muskrat, MD 12/29/12 [phone]

## 2012-12-29 NOTE — ED Notes (Signed)
Pt states her right leg has been giving out a lot lately, causing falls. Pt states 5 falls in the past week. States weakness only to the leg. Also states lower back pain.

## 2013-02-14 ENCOUNTER — Emergency Department (HOSPITAL_COMMUNITY)
Admission: EM | Admit: 2013-02-14 | Discharge: 2013-02-14 | Disposition: A | Discharge status: Home / Self Care | Payer: Medicare Other | Attending: Emergency Medicine | Admitting: Emergency Medicine

## 2013-02-14 ENCOUNTER — Emergency Department (HOSPITAL_COMMUNITY): Payer: Medicare Other

## 2013-02-14 ENCOUNTER — Encounter (HOSPITAL_COMMUNITY): Payer: Self-pay | Admitting: *Deleted

## 2013-02-14 DIAGNOSIS — S0083XA Contusion of other part of head, initial encounter: Secondary | ICD-10-CM | POA: Insufficient documentation

## 2013-02-14 DIAGNOSIS — R5383 Other fatigue: Secondary | ICD-10-CM | POA: Insufficient documentation

## 2013-02-14 DIAGNOSIS — S0003XA Contusion of scalp, initial encounter: Secondary | ICD-10-CM | POA: Insufficient documentation

## 2013-02-14 DIAGNOSIS — Y9289 Other specified places as the place of occurrence of the external cause: Secondary | ICD-10-CM | POA: Insufficient documentation

## 2013-02-14 DIAGNOSIS — Y939 Activity, unspecified: Secondary | ICD-10-CM | POA: Insufficient documentation

## 2013-02-14 DIAGNOSIS — R5381 Other malaise: Secondary | ICD-10-CM | POA: Insufficient documentation

## 2013-02-14 DIAGNOSIS — T148XXA Other injury of unspecified body region, initial encounter: Secondary | ICD-10-CM

## 2013-02-14 DIAGNOSIS — Z79899 Other long term (current) drug therapy: Secondary | ICD-10-CM | POA: Insufficient documentation

## 2013-02-14 DIAGNOSIS — G8929 Other chronic pain: Secondary | ICD-10-CM | POA: Insufficient documentation

## 2013-02-14 DIAGNOSIS — I10 Essential (primary) hypertension: Secondary | ICD-10-CM | POA: Insufficient documentation

## 2013-02-14 DIAGNOSIS — Z87891 Personal history of nicotine dependence: Secondary | ICD-10-CM | POA: Insufficient documentation

## 2013-02-14 DIAGNOSIS — R296 Repeated falls: Secondary | ICD-10-CM | POA: Insufficient documentation

## 2013-02-14 DIAGNOSIS — R51 Headache: Secondary | ICD-10-CM

## 2013-02-14 DIAGNOSIS — R079 Chest pain, unspecified: Secondary | ICD-10-CM

## 2013-02-14 DIAGNOSIS — Z7982 Long term (current) use of aspirin: Secondary | ICD-10-CM | POA: Insufficient documentation

## 2013-02-14 DIAGNOSIS — S298XXA Other specified injuries of thorax, initial encounter: Secondary | ICD-10-CM | POA: Insufficient documentation

## 2013-02-14 DIAGNOSIS — Z87448 Personal history of other diseases of urinary system: Secondary | ICD-10-CM | POA: Insufficient documentation

## 2013-02-14 LAB — CBC WITH DIFFERENTIAL/PLATELET
Basophils Absolute: 0.1 10*3/uL (ref 0.0–0.1)
Basophils Relative: 1 % (ref 0–1)
Eosinophils Absolute: 0.2 10*3/uL (ref 0.0–0.7)
Eosinophils Relative: 5 % (ref 0–5)
HCT: 37 % (ref 36.0–46.0)
Hemoglobin: 11.8 g/dL — ABNORMAL LOW (ref 12.0–15.0)
Lymphocytes Relative: 35 % (ref 12–46)
Lymphs Abs: 1.7 10*3/uL (ref 0.7–4.0)
MCH: 30.9 pg (ref 26.0–34.0)
MCHC: 31.9 g/dL (ref 30.0–36.0)
MCV: 96.9 fL (ref 78.0–100.0)
Monocytes Absolute: 0.3 10*3/uL (ref 0.1–1.0)
Monocytes Relative: 7 % (ref 3–12)
Neutro Abs: 2.5 10*3/uL (ref 1.7–7.7)
Neutrophils Relative %: 52 % (ref 43–77)
Platelets: 199 10*3/uL (ref 150–400)
RBC: 3.82 MIL/uL — ABNORMAL LOW (ref 3.87–5.11)
RDW: 13 % (ref 11.5–15.5)
WBC: 4.8 10*3/uL (ref 4.0–10.5)

## 2013-02-14 LAB — TROPONIN I: Troponin I: <0.3 ng/mL (ref <0.30)

## 2013-02-14 LAB — BASIC METABOLIC PANEL
BUN: 19 mg/dL (ref 6–23)
CO2: 21 mEq/L (ref 19–32)
Calcium: 8.5 mg/dL (ref 8.4–10.5)
Chloride: 114 mEq/L — ABNORMAL HIGH (ref 96–112)
Creatinine, Ser: 1.26 mg/dL — ABNORMAL HIGH (ref 0.50–1.10)
GFR calc Af Amer: 52 mL/min — ABNORMAL LOW (ref >90)
GFR calc non Af Amer: 45 mL/min — ABNORMAL LOW (ref >90)
Glucose, Bld: 100 mg/dL — ABNORMAL HIGH (ref 70–99)
Potassium: 3.8 mEq/L (ref 3.5–5.1)
Sodium: 145 mEq/L (ref 135–145)

## 2013-02-14 MED ORDER — TRAMADOL HCL 50 MG PO TABS
50.0000 mg | ORAL_TABLET | Freq: Once | ORAL | Status: AC
  Administered 2013-02-14: 50 mg via ORAL
  Filled 2013-02-14: qty 1

## 2013-02-14 NOTE — ED Provider Notes (Signed)
History  This chart was scribed for Orpah Greek, MD by Roe Coombs, ED Scribe. The patient was seen in room APA05/APA05. Patient's care was started at 1511.   CSN: [id number]  Arrival date & time 02/14/13  1511     Chief Complaint  Patient presents with  . Fall    The history is provided by the patient. No language interpreter was used.     HPI Comments: [name] is a 62 y.o. female who presents to the Emergency Department complaining of fall that occurred immediately prior to arrival. There is associated diffuse, right-sided pain resulting from the impact of the fall. She states that she was visiting another patient upstairs when her right light leg gave out. Patient says that she has been having right leg weakness for the past few days. She denies head injury or loss of consciousness upon falling. She also complains of poorly characterized chest pain at this time. She states that she has had a persistent moderate headache for the past 1 week, but says that she has a history of recurrent headaches for which she is being treated by a neurologist. Patient's medical history includes seizures, hypertension, renal insufficiency and chronic headaches.   Past Medical History  Diagnosis Date  . Renal insufficiency   . Seizures   . Chronic headaches   . Hypertension     Past Surgical History  Procedure Laterality Date  . Breast reduction surgery    . Abdominal hysterectomy    . Lung lobectomy      rt lung-cyst   . Appendectomy    . Cholecystectomy    . Back surgery  2002    No family history on file.  History  Substance Use Topics  . Smoking status: Former Smoker -- 1.00 packs/day for 30 years    Types: Cigarettes    Quit date: 11/13/2012  . Smokeless tobacco: Never Used  . Alcohol Use: No    OB History   Grav Para Term Preterm Abortions TAB SAB Ect Mult Living                  Review of Systems  Cardiovascular: Positive for chest pain.   Neurological: Positive for weakness and headaches.  All other systems reviewed and are negative.    Allergies  Penicillins and Sulfonamide derivatives  Home Medications   Current Outpatient Rx  Name  Route  Sig  Dispense  Refill  . amLODipine (NORVASC) 5 MG tablet   Oral   Take 5 mg by mouth every morning.          [name] aspirin 81 MG tablet   Oral   Take 81 mg by mouth daily.         [name] atenolol (TENORMIN) 100 MG tablet   Oral   Take 100 mg by mouth at bedtime.         . cholecalciferol (VITAMIN D) 1000 UNITS tablet   Oral   Take 1,000 Units by mouth daily.         . DULoxetine (CYMBALTA) 30 MG capsule   Oral   Take 90 mg by mouth every morning.          . levETIRAcetam (KEPPRA) 250 MG tablet   Oral   Take 250 mg by mouth every 12 (twelve) hours.         . lidocaine (XYLOCAINE) 4 % external solution   Topical   Apply 2 mLs topically daily as needed. Apply a cotton ball  amount to migraine area         . oxyCODONE-acetaminophen (PERCOCET/ROXICET) 5-325 MG per tablet   Oral   Take 1 tablet by mouth every 8 (eight) hours as needed for pain.   20 tablet   0   . pantoprazole (PROTONIX) 40 MG tablet   Oral   Take 40 mg by mouth daily.         . simvastatin (ZOCOR) 10 MG tablet   Oral   Take 10 mg by mouth at bedtime.         . topiramate (TOPAMAX) 100 MG tablet   Oral   Take 100 mg by mouth 2 (two) times daily.         [name] zolpidem (AMBIEN) 10 MG tablet   Oral   Take 10 mg by mouth at bedtime as needed. For sleep           Triage Vitals: BP 131/55  Pulse 60  Temp(Src) 98.4 F (36.9 C) (Oral)  Resp 20  Ht 5\' 7"  (1.702 m)  Wt 180 lb (81.647 kg)  BMI 28.19 kg/m2  SpO2 100%  Physical Exam  Constitutional: She is oriented to person, place, and time. She appears well-developed and well-nourished. No distress.  HENT:  Head: Normocephalic and atraumatic.  Right Ear: Hearing normal.  Nose: Nose normal.  Mouth/Throat: Oropharynx is  clear and moist and mucous membranes are normal.  Eyes: Conjunctivae and EOM are normal. Pupils are equal, round, and reactive to light.  Neck: Normal range of motion. Neck supple.  Cardiovascular: Normal rate, regular rhythm, S1 normal and S2 normal.  Exam reveals no gallop and no friction rub.   No murmur heard. Pulmonary/Chest: Effort normal and breath sounds normal. No respiratory distress. She exhibits no tenderness.  Abdominal: Soft. Normal appearance and bowel sounds are normal. There is no hepatosplenomegaly. There is no tenderness. There is no rebound, no guarding, no tenderness at McBurney's point and negative Murphy's sign. No hernia.  Musculoskeletal: Normal range of motion.  Neurological: She is alert and oriented to person, place, and time. She has normal strength. No cranial nerve deficit or sensory deficit. Coordination normal. GCS eye subscore is 4. GCS verbal subscore is 5. GCS motor subscore is 6.  Skin: Skin is warm, dry and intact. No rash noted. No cyanosis.  Psychiatric: She has a normal mood and affect. Her speech is normal and behavior is normal. Thought content normal.    ED Course  Procedures (including critical care time) DIAGNOSTIC STUDIES: Oxygen Saturation is 100% on room air, normal by my interpretation.    EKG:  Date: 02/14/2013  Rate: 61  Rhythm: normal sinus rhythm  QRS Axis: normal  Intervals: normal  ST/T Wave abnormalities: nonspecific ST/T changes  Conduction Disutrbances:none  Narrative Interpretation:   Old EKG Reviewed: unchanged    COORDINATION OF CARE: 3:20 PM- Patient informed of current plan for treatment and evaluation and agrees with plan at this time.       Labs Reviewed  CBC WITH DIFFERENTIAL - Abnormal; Notable for the following:    RBC 3.82 (*)    Hemoglobin 11.8 (*)    All other components within normal limits  BASIC METABOLIC PANEL - Abnormal; Notable for the following:    Chloride 114 (*)    Glucose, Bld 100 (*)     Creatinine, Ser 1.26 (*)    GFR calc non Af Amer 45 (*)    GFR calc Af Amer 52 (*)    All  other components within normal limits  TROPONIN I  URINALYSIS, ROUTINE W REFLEX MICROSCOPIC   Dg Chest 2 View  02/14/2013  *RADIOLOGY REPORT*  Clinical Data: Chest pain and weakness  CHEST - 2 VIEW  Comparison: Bone metastatic survey 11/14/2012  Findings: Moderate elevation of the right hemidiaphragm appears stable compared to bone metastatic survey dated 11/14/2012.  The elevated right hemidiaphragm causes slight leftward shift of the heart/mediastinum.  Heart size is stable and appears within normal limits.  There is compressive atelectasis at the right lung base secondary to the elevated hemidiaphragm.  No airspace disease, pleural effusion, or pneumothorax.  The bones appear diffusely osteopenic.  There is minimal focal height loss of the superior endplate of the lower thoracic spine vertebral body, favored to be T11, for which a very mild endplate compression deformity cannot be excluded.  This is similar in appearance compared to the metastatic bone survey of December 2013.  Cholecystectomy clips noted.  The imaged bowel gas pattern is nonobstructive.  IMPRESSION:  1.  No acute cardiopulmonary disease identified.  Stable moderate elevation of the right hemidiaphragm with associated right basilar atelectasis. 2.  Question very mild superior endplate compression fracture of the T11 vertebral body.   Original Report Authenticated By: Curlene Dolphin, M.D.    Ct Head Wo Contrast  02/14/2013  *RADIOLOGY REPORT*  Clinical Data: Fall.  Right leg weakness  CT HEAD WITHOUT CONTRAST  Technique:  Contiguous axial images were obtained from the base of the skull through the vertex without contrast.  Comparison: 12/29/2012  Findings: Ventricle size is normal.  Negative for acute or chronic infarct.  Negative for hemorrhage or mass.  Negative for skull fracture.  IMPRESSION: No acute abnormality.   Original Report Authenticated  By: Carl Best, M.D.      Diagnosis: 1. Headache 2. Multiple contusions from fall 3. Chest pain    MDM  Patient comes to the ER from upstairs in the hospital after a fall. Patient reports that her leg gave out and she fell to the ground, landing on her right side. She did hit her head but there was no loss of consciousness. Patient reports that she has had a persistent headache for almost a week now. She has frequent and chronic headaches. She saw her neurologist for this headache yesterday and was prescribed a new medication. She denies that the headache is significantly different from her previous headaches. Because of the persistent headache and a fall with head injury, a CAT scan was ordered. CT did not show any acute abnormality. Patient to followup with her neurologist.  Patient was difficult patient to evaluate. Although she was here because of the fall, she had an essentially completely positive review of systems. Essentially an asymptomatic her about she endorsed. Among this was chest discomfort. She reports she been having frequent chest discomfort but she cannot really quantify or qualify it. It doesn't seem to be exertional. She thinks it's been present for at least a week. Her EKG did not show any ischemic changes or signs of infarct. Troponin was negative. With continuous pain for as long as she has been experiencing and negative workup here in the ER, I feel she can be followed up as an outpatient.       I personally performed the services described in this documentation, which was scribed in my presence. The recorded information has been reviewed and is accurate.    Orpah Greek, MD 02/14/13 [phone]

## 2013-02-14 NOTE — ED Notes (Signed)
Pt was upstairs visiting another pt when right leg "gave out" and pt fell.  Denies hitting head.  C/o headache "for a few days."  C/o right leg weakness "for a few days" also.

## 2013-02-22 ENCOUNTER — Other Ambulatory Visit: Payer: Self-pay | Admitting: *Deleted

## 2013-02-22 DIAGNOSIS — R803 Bence Jones proteinuria: Secondary | ICD-10-CM

## 2013-02-22 MED ORDER — LEVETIRACETAM 250 MG PO TABS: 250.0000 mg | ORAL_TABLET | Freq: Two times a day (BID) | ORAL | Status: DC

## 2013-02-25 ENCOUNTER — Other Ambulatory Visit: Payer: Self-pay | Admitting: Nurse Practitioner

## 2013-02-26 ENCOUNTER — Telehealth: Payer: Self-pay | Admitting: Nurse Practitioner

## 2013-02-26 NOTE — Telephone Encounter (Signed)
APPT MADE

## 2013-02-27 ENCOUNTER — Ambulatory Visit (INDEPENDENT_AMBULATORY_CARE_PROVIDER_SITE_OTHER): Payer: Medicaid Other | Admitting: Physician Assistant

## 2013-02-27 ENCOUNTER — Encounter: Payer: Self-pay | Admitting: Physician Assistant

## 2013-02-27 VITALS — BP 157/57 | HR 62 | Temp 97.9°F | Ht 67.0 in | Wt 189.4 lb

## 2013-02-27 DIAGNOSIS — M542 Cervicalgia: Secondary | ICD-10-CM

## 2013-02-27 MED ORDER — TRAMADOL HCL 50 MG PO TABS: 50.0000 mg | ORAL_TABLET | Freq: Three times a day (TID) | ORAL | Status: DC | PRN

## 2013-02-27 MED ORDER — METHOCARBAMOL 500 MG PO TABS: 500.0000 mg | ORAL_TABLET | Freq: Every day | ORAL | Status: DC

## 2013-02-27 NOTE — Progress Notes (Signed)
  Subjective:    Patient ID: [name], female    DOB: [birth year], 62 y.o.   MRN: [id number]  HPI [place] 02/18/13; right arm painful and weak, left arm, axillary pain    Review of Systems  Musculoskeletal: Positive for back pain.  Neurological: Positive for weakness.  All other systems reviewed and are negative.       Objective:   Physical Exam  Constitutional: She is oriented to person, place, and time. She appears well-developed and well-nourished.  HENT:  Head: Normocephalic and atraumatic.  Neck:  Left rotation 5 degrees; right rotation 15 degrees Extension; flexion decreases R>L trapezial muscle tightness  Musculoskeletal:  Right hand grip 3/5 Shoulders decreased ROM  Neurological: She is alert and oriented to person, place, and time.  Skin:  Left axilla no erythema          Assessment & Plan:  Cervicalgia - Plan: methocarbamol (ROBAXIN) 500 MG tablet, traMADol (ULTRAM) 50 MG tablet  Moist heat 10 min on, 45 min off

## 2013-03-20 ENCOUNTER — Ambulatory Visit: Payer: Self-pay | Admitting: Nurse Practitioner

## 2013-03-20 ENCOUNTER — Telehealth: Payer: Self-pay | Admitting: Nurse Practitioner

## 2013-03-21 ENCOUNTER — Other Ambulatory Visit: Payer: Self-pay | Admitting: *Deleted

## 2013-03-21 MED ORDER — LORAZEPAM 1 MG PO TABS: 1.0000 mg | ORAL_TABLET | Freq: Two times a day (BID) | ORAL | Status: DC | PRN

## 2013-03-21 NOTE — Telephone Encounter (Signed)
Called into pharmacy

## 2013-03-21 NOTE — Telephone Encounter (Signed)
fyi has appt on Monday with mmm

## 2013-03-21 NOTE — Telephone Encounter (Signed)
Has appt with you on Monday, last filled 01/25/13. Call in at [facility] [phone]

## 2013-03-21 NOTE — Telephone Encounter (Signed)
Please call in rx for lorazepam 0 refills

## 2013-03-21 NOTE — Telephone Encounter (Signed)
Mmm, pt wants a rf on lorazepam - she is already out [facility] [place]

## 2013-03-25 ENCOUNTER — Encounter: Payer: Self-pay | Admitting: Nurse Practitioner

## 2013-03-25 ENCOUNTER — Ambulatory Visit (INDEPENDENT_AMBULATORY_CARE_PROVIDER_SITE_OTHER): Payer: Medicare Other | Admitting: Nurse Practitioner

## 2013-03-25 VITALS — BP 130/73 | HR 68 | Temp 97.9°F | Ht 66.0 in | Wt 193.0 lb

## 2013-03-25 DIAGNOSIS — E785 Hyperlipidemia, unspecified: Secondary | ICD-10-CM

## 2013-03-25 DIAGNOSIS — R51 Headache: Secondary | ICD-10-CM

## 2013-03-25 DIAGNOSIS — D472 Monoclonal gammopathy: Secondary | ICD-10-CM | POA: Insufficient documentation

## 2013-03-25 DIAGNOSIS — F32A Depression, unspecified: Secondary | ICD-10-CM

## 2013-03-25 DIAGNOSIS — R803 Bence Jones proteinuria: Secondary | ICD-10-CM

## 2013-03-25 DIAGNOSIS — R809 Proteinuria, unspecified: Secondary | ICD-10-CM

## 2013-03-25 DIAGNOSIS — R569 Unspecified convulsions: Secondary | ICD-10-CM

## 2013-03-25 DIAGNOSIS — F3289 Other specified depressive episodes: Secondary | ICD-10-CM

## 2013-03-25 DIAGNOSIS — G4700 Insomnia, unspecified: Secondary | ICD-10-CM | POA: Insufficient documentation

## 2013-03-25 DIAGNOSIS — C9001 Multiple myeloma in remission: Secondary | ICD-10-CM

## 2013-03-25 DIAGNOSIS — F329 Major depressive disorder, single episode, unspecified: Secondary | ICD-10-CM

## 2013-03-25 HISTORY — DX: Monoclonal gammopathy: D47.2

## 2013-03-25 MED ORDER — KETOROLAC TROMETHAMINE 60 MG/2ML IM SOLN
60.0000 mg | Freq: Once | INTRAMUSCULAR | Status: AC
  Administered 2013-03-25: 60 mg via INTRAMUSCULAR

## 2013-03-25 MED ORDER — ZOLPIDEM TARTRATE 10 MG PO TABS: 10.0000 mg | ORAL_TABLET | Freq: Every evening | ORAL | Status: DC | PRN

## 2013-03-25 NOTE — Progress Notes (Signed)
  Subjective:    Patient ID: [name], female    DOB: [birth year], 62 y.o.   MRN: [id number]  HPI1. Patient in C/O headache. Started over the weekend. Rates 10/10. Nothing has helped. Patient sees neurologist for headaches. HAs used lidocaine gel but not helping. 2. Right hip pain with occasional "catch". Says when walking leg will just give way on her. This has been going off an don for years. Nothing helps. Patient has had xrays done many times an dcan finsd nothing wrong with hip lower back or leg.    Review of Systems  Constitutional: Negative for fever and chills.  HENT: Negative for congestion, rhinorrhea, neck pain, sinus pressure and ear discharge.   Respiratory: Negative for chest tightness.   Cardiovascular: Negative.   Gastrointestinal: Negative.   Neurological: Positive for headaches. Negative for dizziness, seizures, light-headedness and numbness.  Psychiatric/Behavioral: Negative.        Objective:   Physical Exam  Constitutional: She is oriented to person, place, and time. She appears well-developed and well-nourished.  HENT:  Head: Normocephalic.  Right Ear: External ear normal.  Left Ear: External ear normal.  Nose: Nose normal.  Mouth/Throat: Oropharynx is clear and moist.  Cardiovascular: Normal rate and normal heart sounds.   Pulmonary/Chest: Effort normal and breath sounds normal.  Musculoskeletal:  FROM of right hip and leg without pain.  Neurological: She is alert and oriented to person, place, and time. She has normal reflexes.  Psychiatric: She has a normal mood and affect. Her behavior is normal. Judgment and thought content normal.   BP 130/73  Pulse 68  Temp(Src) 97.9 F (36.6 C) (Oral)  Ht 5\' 6"  (1.676 m)  Wt 193 lb (87.544 kg)  BMI 31.17 kg/m2        Assessment & Plan:  1. Headache Rest Avoid Caffeine -Toradol 60mg  IM Now Keep F/U with neurologist  2. Right hip and leg pain -Toradol should help Moist heat to hip if  helps Rest  Mary-Margaret Hassell Done, FNP

## 2013-03-25 NOTE — Patient Instructions (Signed)

## 2013-04-02 ENCOUNTER — Other Ambulatory Visit: Payer: Self-pay | Admitting: *Deleted

## 2013-04-02 DIAGNOSIS — R803 Bence Jones proteinuria: Secondary | ICD-10-CM

## 2013-04-02 MED ORDER — DULOXETINE HCL 30 MG PO CPEP: 30.0000 mg | ORAL_CAPSULE | Freq: Every day | ORAL | Status: DC

## 2013-04-02 NOTE — Telephone Encounter (Signed)
LAST OV 1/14

## 2013-04-08 ENCOUNTER — Other Ambulatory Visit: Payer: Self-pay | Admitting: Family Medicine

## 2013-04-11 ENCOUNTER — Telehealth: Payer: Self-pay | Admitting: Nurse Practitioner

## 2013-04-11 ENCOUNTER — Ambulatory Visit: Payer: Medicare Other | Admitting: Nurse Practitioner

## 2013-04-11 NOTE — Telephone Encounter (Signed)
APPT MADE

## 2013-04-12 ENCOUNTER — Ambulatory Visit (INDEPENDENT_AMBULATORY_CARE_PROVIDER_SITE_OTHER): Payer: Medicare Other | Admitting: Family Medicine

## 2013-04-12 ENCOUNTER — Encounter: Payer: Self-pay | Admitting: Family Medicine

## 2013-04-12 ENCOUNTER — Other Ambulatory Visit: Payer: Self-pay | Admitting: Family Medicine

## 2013-04-12 ENCOUNTER — Ambulatory Visit (INDEPENDENT_AMBULATORY_CARE_PROVIDER_SITE_OTHER): Payer: Medicare Other

## 2013-04-12 VITALS — BP 152/69 | HR 57 | Temp 98.2°F | Ht 67.0 in | Wt 193.0 lb

## 2013-04-12 DIAGNOSIS — M25559 Pain in unspecified hip: Secondary | ICD-10-CM

## 2013-04-12 DIAGNOSIS — M5430 Sciatica, unspecified side: Secondary | ICD-10-CM

## 2013-04-12 DIAGNOSIS — R52 Pain, unspecified: Secondary | ICD-10-CM

## 2013-04-12 DIAGNOSIS — G8929 Other chronic pain: Secondary | ICD-10-CM

## 2013-04-12 MED ORDER — PREDNISONE 50 MG PO TABS: ORAL_TABLET | ORAL | Status: DC

## 2013-04-12 MED ORDER — SODIUM CHLORIDE 0.9 % IV SOLN: 125.0000 mg | Freq: Once | INTRAVENOUS | Status: DC

## 2013-04-12 MED ORDER — METHYLPREDNISOLONE ACETATE 40 MG/ML IJ SUSP
80.0000 mg | Freq: Once | INTRAMUSCULAR | Status: AC
  Administered 2013-04-12: 80 mg via INTRAMUSCULAR

## 2013-04-12 NOTE — Progress Notes (Signed)
  Subjective:    Patient ID: [name], female    DOB: 1951/01/31, 62 y.o.   MRN: [id number]  HPI Pt presents today with LBP and bilateral leg pain.  Has baseline hx/o LBP and sciatica s/p back surgery Having bilateral radicular sxs including pain, burning.  No bowel or bladder anesthesia.  Pain has kept her up at night.  Also having some leg cramping and pain in LEs with ambulation.  Has hx/o recurrent falls.  Ambulates with a cane currently.     Review of Systems  All other systems reviewed and are negative.       Objective:   Physical Exam  Constitutional: She appears well-developed and well-nourished.  HENT:  Head: Normocephalic and atraumatic.  Eyes: Conjunctivae are normal. Pupils are equal, round, and reactive to light.  Neck: Normal range of motion.  Cardiovascular: Normal rate and regular rhythm.   Pulmonary/Chest: Effort normal.  Abdominal: Soft.  Musculoskeletal:       Arms: + TTP in lumbar region  + pain with hip flexion bilaterally  + Pain with internal rotation of R hip  FABER + bilaterally  Neurovascularly intact distally    Skin: Skin is warm.    [facility] reading (PRIMARY) by  Dr. Ileana Roup         L spine xrays: noted post operative clips and degenerative changes diffusely R hip xray: mild intertrochanteric arthritis, no fracture                                   Assessment & Plan:  DDx includes worsening sciatica vs intermittent claudication vs. Electrolyte deficiency  ? Component of R hip OA vs. Radiculopathy. Will place on prednisone for inflammatory component.  Short course of vicodin for pain  Will set up for ABIs Check electrolytes. Consider sports medicine if sxs persist despite treatment and negative work up.      The patient and/or caregiver has been counseled thoroughly with regard to treatment plan and/or medications prescribed including dosage, schedule, interactions, rationale for use, and possible side effects and  they verbalize understanding. Diagnoses and expected course of recovery discussed and will return if not improved as expected or if the condition worsens. Patient and/or caregiver verbalized understanding.

## 2013-04-15 ENCOUNTER — Other Ambulatory Visit: Payer: Self-pay

## 2013-04-15 DIAGNOSIS — R803 Bence Jones proteinuria: Secondary | ICD-10-CM

## 2013-04-15 MED ORDER — DULOXETINE HCL 30 MG PO CPEP: 60.0000 mg | ORAL_CAPSULE | Freq: Every day | ORAL | Status: DC

## 2013-04-25 ENCOUNTER — Other Ambulatory Visit: Payer: Self-pay

## 2013-04-25 DIAGNOSIS — R803 Bence Jones proteinuria: Secondary | ICD-10-CM

## 2013-04-25 MED ORDER — ZOLPIDEM TARTRATE 10 MG PO TABS: 10.0000 mg | ORAL_TABLET | Freq: Every evening | ORAL | Status: DC | PRN

## 2013-04-25 NOTE — Telephone Encounter (Signed)
Last filled 03/25/13  Last seen 04/12/13   Phone in and have nurse call patient

## 2013-04-25 NOTE — Telephone Encounter (Signed)
Please call in RX for [employer]

## 2013-04-26 NOTE — Telephone Encounter (Signed)
RX called to pharmacy.

## 2013-04-30 ENCOUNTER — Other Ambulatory Visit: Payer: Self-pay

## 2013-04-30 MED ORDER — LORAZEPAM 1 MG PO TABS: 1.0000 mg | ORAL_TABLET | Freq: Two times a day (BID) | ORAL | Status: DC | PRN

## 2013-04-30 NOTE — Telephone Encounter (Signed)
Called to [facility] pharmacy

## 2013-04-30 NOTE — Telephone Encounter (Signed)
Last filled 03/21/13  Last visit 04/12/13   If approved call in and have nurse call patient to notify

## 2013-04-30 NOTE — Telephone Encounter (Signed)
Please call in rx for lorazepam with 1 refill

## 2013-05-01 ENCOUNTER — Other Ambulatory Visit: Payer: Self-pay

## 2013-05-01 NOTE — Telephone Encounter (Signed)
Need chart

## 2013-05-01 NOTE — Telephone Encounter (Signed)
Again need patient chart

## 2013-05-01 NOTE — Telephone Encounter (Signed)
ast seen 04/12/13 Dr. Ernestina Patches   If approved print and have nurse call patient to pick up

## 2013-05-02 NOTE — Telephone Encounter (Signed)
NTBS for pain meds

## 2013-05-03 NOTE — Telephone Encounter (Signed)
Patient aware she ntbs.

## 2013-05-13 ENCOUNTER — Other Ambulatory Visit: Payer: Self-pay | Admitting: *Deleted

## 2013-05-13 DIAGNOSIS — R803 Bence Jones proteinuria: Secondary | ICD-10-CM

## 2013-05-13 MED ORDER — LEVETIRACETAM 250 MG PO TABS: 250.0000 mg | ORAL_TABLET | Freq: Two times a day (BID) | ORAL | Status: DC

## 2013-05-13 NOTE — Telephone Encounter (Signed)
i do not see a keppra level on her chart?

## 2013-05-13 NOTE — Telephone Encounter (Signed)
Did patient need keppra rx?

## 2013-05-14 DIAGNOSIS — R809 Proteinuria, unspecified: Secondary | ICD-10-CM | POA: Insufficient documentation

## 2013-05-27 ENCOUNTER — Other Ambulatory Visit: Payer: Self-pay | Admitting: *Deleted

## 2013-05-27 DIAGNOSIS — R803 Bence Jones proteinuria: Secondary | ICD-10-CM

## 2013-05-27 MED ORDER — ZOLPIDEM TARTRATE 10 MG PO TABS: 10.0000 mg | ORAL_TABLET | Freq: Every evening | ORAL | Status: DC | PRN

## 2013-05-27 NOTE — Telephone Encounter (Signed)
Please call in rx for ambien with 2 refills

## 2013-05-27 NOTE — Telephone Encounter (Signed)
Pharm aware of rfs

## 2013-05-27 NOTE — Telephone Encounter (Signed)
LAST RF 04/26/13. [place] [phone]

## 2013-06-03 ENCOUNTER — Other Ambulatory Visit (HOSPITAL_COMMUNITY): Payer: Medicare Other

## 2013-06-03 ENCOUNTER — Encounter (HOSPITAL_COMMUNITY): Payer: Medicare Other | Attending: Internal Medicine

## 2013-06-03 DIAGNOSIS — D472 Monoclonal gammopathy: Secondary | ICD-10-CM | POA: Insufficient documentation

## 2013-06-03 LAB — CBC WITH DIFFERENTIAL/PLATELET
Basophils Absolute: 0.1 10*3/uL (ref 0.0–0.1)
Basophils Relative: 1 % (ref 0–1)
Eosinophils Absolute: 0.2 10*3/uL (ref 0.0–0.7)
Eosinophils Relative: 4 % (ref 0–5)
HCT: 39.8 % (ref 36.0–46.0)
Hemoglobin: 13 g/dL (ref 12.0–15.0)
Lymphocytes Relative: 38 % (ref 12–46)
Lymphs Abs: 1.9 10*3/uL (ref 0.7–4.0)
MCH: 31.6 pg (ref 26.0–34.0)
MCHC: 32.7 g/dL (ref 30.0–36.0)
MCV: 96.8 fL (ref 78.0–100.0)
Monocytes Absolute: 0.3 10*3/uL (ref 0.1–1.0)
Monocytes Relative: 7 % (ref 3–12)
Neutro Abs: 2.4 10*3/uL (ref 1.7–7.7)
Neutrophils Relative %: 49 % (ref 43–77)
Platelets: 340 10*3/uL (ref 150–400)
RBC: 4.11 MIL/uL (ref 3.87–5.11)
RDW: 12.3 % (ref 11.5–15.5)
WBC: 4.9 10*3/uL (ref 4.0–10.5)

## 2013-06-03 LAB — COMPREHENSIVE METABOLIC PANEL
ALT: 22 U/L (ref 0–35)
AST: 22 U/L (ref 0–37)
Albumin: 3.6 g/dL (ref 3.5–5.2)
Alkaline Phosphatase: 113 U/L (ref 39–117)
BUN: 21 mg/dL (ref 6–23)
CO2: 26 mEq/L (ref 19–32)
Calcium: 8.8 mg/dL (ref 8.4–10.5)
Chloride: 106 mEq/L (ref 96–112)
Creatinine, Ser: 1.4 mg/dL — ABNORMAL HIGH (ref 0.50–1.10)
GFR calc Af Amer: 46 mL/min — ABNORMAL LOW (ref >90)
GFR calc non Af Amer: 40 mL/min — ABNORMAL LOW (ref >90)
Glucose, Bld: 79 mg/dL (ref 70–99)
Potassium: 4.4 mEq/L (ref 3.5–5.1)
Sodium: 140 mEq/L (ref 135–145)
Total Bilirubin: 0.3 mg/dL (ref 0.3–1.2)
Total Protein: 7.3 g/dL (ref 6.0–8.3)

## 2013-06-03 NOTE — Progress Notes (Signed)
[name] presented for [employer]. Labs per MD order drawn via Peripheral Line 23 gauge needle inserted in right hand  Good blood return present. Procedure without incident.  Needle removed intact. Patient tolerated procedure well.

## 2013-06-04 LAB — KAPPA/LAMBDA LIGHT CHAINS
Kappa free light chain: 4.88 mg/dL — ABNORMAL HIGH (ref 0.33–1.94)
Kappa, lambda light chain ratio: 2.08 — ABNORMAL HIGH (ref 0.26–1.65)
Lambda free light chains: 2.35 mg/dL (ref 0.57–2.63)

## 2013-06-04 LAB — BETA 2 MICROGLOBULIN, SERUM: Beta-2 Microglobulin: 3.12 mg/L — ABNORMAL HIGH (ref 1.01–1.73)

## 2013-06-05 ENCOUNTER — Ambulatory Visit (HOSPITAL_COMMUNITY): Payer: Medicare Other | Admitting: Oncology

## 2013-06-05 LAB — PROTEIN, URINE, 24 HOUR
Collection Interval-UPROT: 24 hours
Protein, 24H Urine: 105 mg/d — ABNORMAL HIGH (ref 50–100)
Protein, Urine: 7 mg/dL
Urine Total Volume-UPROT: 1500 mL

## 2013-06-05 LAB — MULTIPLE MYELOMA PANEL, SERUM
Albumin ELP: 52.9 % — ABNORMAL LOW (ref 55.8–66.1)
Alpha-1-Globulin: 4.6 % (ref 2.9–4.9)
Alpha-2-Globulin: 10.6 % (ref 7.1–11.8)
Beta 2: 6.3 % (ref 3.2–6.5)
Beta Globulin: 6.4 % (ref 4.7–7.2)
Gamma Globulin: 19.2 % — ABNORMAL HIGH (ref 11.1–18.8)
IgA: 243 mg/dL (ref 69–380)
IgG (Immunoglobin G), Serum: 1300 mg/dL (ref 690–1700)
IgM, Serum: 200 mg/dL (ref 52–322)
M-Spike, %: 0.55 g/dL
Total Protein: 6.9 g/dL (ref 6.0–8.3)

## 2013-06-05 LAB — CREATININE CLEARANCE, URINE, 24 HOUR
Collection Interval-CRCL: 24 hours
Creatinine Clearance: 52 mL/min — ABNORMAL LOW (ref 75–115)
Creatinine, 24H Ur: 1055 mg/d (ref 700–1800)
Creatinine, Urine: 70.31 mg/dL
Creatinine: 1.4 mg/dL — ABNORMAL HIGH (ref 0.50–1.10)
Urine Total Volume-CRCL: 1500 mL

## 2013-06-05 NOTE — Addendum Note (Signed)
Addended byLolita Lenz J on: 06/05/2013 11:06 AM   Modules accepted: Orders

## 2013-06-06 ENCOUNTER — Other Ambulatory Visit: Payer: Self-pay

## 2013-06-06 ENCOUNTER — Telehealth (HOSPITAL_COMMUNITY): Payer: Self-pay

## 2013-06-06 MED ORDER — LORAZEPAM 1 MG PO TABS: 1.0000 mg | ORAL_TABLET | Freq: Two times a day (BID) | ORAL | Status: DC | PRN

## 2013-06-06 NOTE — Telephone Encounter (Signed)
Last seen 04/12/13  Dr Ernestina Patches   If approved phone in and have nurse notify patient

## 2013-06-06 NOTE — Telephone Encounter (Signed)
Rx called in 

## 2013-06-06 NOTE — Telephone Encounter (Signed)
Please call in lorazepam rx with 2 refills

## 2013-06-06 NOTE — Telephone Encounter (Signed)
Message copied by Mellissa Kohut on Thu Jun 06, 2013 12:54 PM ------      Message from: [facility]      Created: Thu Jun 06, 2013  9:23 AM       Stable.  Labs and return as scheduled ------

## 2013-06-07 LAB — UIFE/LIGHT CHAINS/TP QN, 24-HR UR
Albumin, U: DETECTED
Alpha 1, Urine: DETECTED — AB
Alpha 2, Urine: DETECTED — AB
Beta, Urine: DETECTED — AB
Free Kappa Lt Chains,Ur: 0.99 mg/dL (ref 0.14–2.42)
Free Kappa/Lambda Ratio: 9 ratio (ref 2.04–10.37)
Free Lambda Excretion/Day: 1.65 mg/d
Free Lambda Lt Chains,Ur: 0.11 mg/dL (ref 0.02–0.67)
Free Lt Chn Excr Rate: 14.85 mg/d
Gamma Globulin, Urine: DETECTED — AB
Time: 24 hours
Total Protein, Urine-Ur/day: 48 mg/d (ref 10–140)
Total Protein, Urine: 3.2 mg/dL
Volume, Urine: 1500 mL

## 2013-06-14 ENCOUNTER — Encounter (HOSPITAL_COMMUNITY): Payer: Self-pay | Admitting: Oncology

## 2013-06-14 ENCOUNTER — Ambulatory Visit (HOSPITAL_COMMUNITY): Payer: Medicare Other | Admitting: Oncology

## 2013-06-14 NOTE — Progress Notes (Signed)
-  Patient rescheduled, late for appointment-

## 2013-06-21 ENCOUNTER — Encounter (HOSPITAL_BASED_OUTPATIENT_CLINIC_OR_DEPARTMENT_OTHER): Payer: Medicare Other

## 2013-06-21 ENCOUNTER — Encounter (HOSPITAL_COMMUNITY): Payer: Self-pay

## 2013-06-21 VITALS — BP 160/84 | HR 68 | Temp 97.8°F | Resp 18 | Wt 200.8 lb

## 2013-06-21 DIAGNOSIS — D472 Monoclonal gammopathy: Secondary | ICD-10-CM

## 2013-06-21 NOTE — Patient Instructions (Addendum)
[facility] Discharge Instructions  RECOMMENDATIONS MADE BY THE CONSULTANT AND ANY TEST RESULTS WILL BE SENT TO YOUR REFERRING PHYSICIAN.  EXAM FINDINGS BY THE PHYSICIAN TODAY AND SIGNS OR SYMPTOMS TO REPORT TO CLINIC OR PRIMARY PHYSICIAN: Exam and discussion by Dr. Lynnell Jude.  MEDICATIONS PRESCRIBED:  none  INSTRUCTIONS GIVEN AND DISCUSSED: Will recheck your blood work in 6 months.  SPECIAL INSTRUCTIONS/FOLLOW-UP: Follow-up in 6 months with blood work and exam.  Thank you for choosing [facility] to provide your oncology and hematology care.  To afford each patient quality time with our providers, please arrive at least 15 minutes before your scheduled appointment time.  With your help, our goal is to use those 15 minutes to complete the necessary work-up to ensure our physicians have the information they need to help with your evaluation and healthcare recommendations.    Effective January 1st, 2014, we ask that you re-schedule your appointment with our physicians should you arrive 10 or more minutes late for your appointment.  We strive to give you quality time with our providers, and arriving late affects you and other patients whose appointments are after yours.    Again, thank you for choosing [facility].  Our hope is that these requests will decrease the amount of time that you wait before being seen by our physicians.       _____________________________________________________________  Should you have questions after your visit to [facility], please contact our office at [phone] between the hours of 8:30 a.m. and 5:00 p.m.  Voicemails left after 4:30 p.m. will not be returned until the following business day.  For prescription refill requests, have your pharmacy contact our office with your prescription refill request.

## 2013-06-21 NOTE — Progress Notes (Signed)
      [place] [phone]   [phone]  OFFICE PROGRESS NOTE  [name], [place] [address]  DIAGNOSIS:IgG kappa MGUS  INTERVAL HISTORY:   Alaina Pandolfo 62 y.o. female returns to the clinic today for scheduled follow up of MGUS.  She is being seen every 6 month.  She denies any symptoms specific for myeloma such as bone pain.  She does have a history of seizure and did state that she had a breakthrough episodes or seizure yesterday despite being compliant with her medications.  She reports that she recently saw her neurologist on 06/13/2013 and changes have been made to her antiseizure treatment.   MEDICAL HISTORY: Past Medical History  Diagnosis Date  . Renal insufficiency   . Seizures   . Chronic headaches   . Hypertension   . Osteoporosis   . Depression   . MGUS (monoclonal gammopathy of unknown significance) 03/25/2013     REVIEW OF SYSTEMS: as above otherwise negative.  PHYSICAL EXAMINATION:  Blood pressure 160/84, pulse 68, temperature 97.8 F (36.6 C), temperature source Oral, resp. rate 18, weight 200 lb 12.8 oz (91.082 kg). GENERAL: No distress. Obese. LUNGS: clear to auscultation , no crackles or wheezes HEART: regular rate & rhythm, no murmurs, no gallops, S1 normal and S2 normal  ABDOMEN: Abdomen soft, non-tender. EXTREMITIES: No edema, no skin discoloration or tenderness NEURO: alert & oriented      LABORATORY DATA: Lab Results  Component Value Date   WBC 4.9 06/03/2013   HGB 13.0 06/03/2013   HCT 39.8 06/03/2013   MCV 96.8 06/03/2013   PLT 340 06/03/2013      Chemistry      Component Value Date/Time   NA 140 06/03/2013 1415   K 4.4 06/03/2013 1415   CL 106 06/03/2013 1415   CO2 26 06/03/2013 1415   BUN 21 06/03/2013 1415   CREATININE 1.40* 06/05/2013 1103   CREATININE 1.40* 06/03/2013 1415      Component Value Date/Time   CALCIUM 8.8 06/03/2013 1415   ALKPHOS 113 06/03/2013 1415   AST 22 06/03/2013  1415   ALT 22 06/03/2013 1415   BILITOT 0.3 06/03/2013 1415     06/03/2013: IgG 1300, Kappa 4.88 , k/l ratio of 2.08 and M spike of 0.55.   ASSESSMENT:  IgG MGUS stable at this time without any evidence to suggest progression to multiple myeloma or smoldering myeloma. Patient is in the low intermediate risk category with absolute risk of  Progression over  20 years of about 21% (Rajkumar SV, et al ; Blood. 2005 Aug 1;106(3):812-7. Epub 2005 Apr 26) due to one risk factor of abnormal light chain ratio.    PLAN:  1. Return to clinic in 6 months we'll repeat electrophoresis, light chains and IgG,A and M at that time. 2. Follow up with neurology for seizure problem.     All questions were satisfactorily answered. Patient knows to call if  any concern arises.   Verlan Friends, MD FACP. Hematology/Oncology.

## 2013-06-26 ENCOUNTER — Other Ambulatory Visit: Payer: Self-pay | Admitting: *Deleted

## 2013-06-26 DIAGNOSIS — R803 Bence Jones proteinuria: Secondary | ICD-10-CM

## 2013-06-26 MED ORDER — PANTOPRAZOLE SODIUM 40 MG PO TBEC: 40.0000 mg | DELAYED_RELEASE_TABLET | Freq: Every day | ORAL | Status: DC

## 2013-07-18 ENCOUNTER — Encounter: Payer: Self-pay | Admitting: Oncology

## 2013-07-22 ENCOUNTER — Ambulatory Visit (INDEPENDENT_AMBULATORY_CARE_PROVIDER_SITE_OTHER): Payer: Medicare Other | Admitting: Nurse Practitioner

## 2013-07-22 ENCOUNTER — Encounter: Payer: Self-pay | Admitting: Nurse Practitioner

## 2013-07-22 VITALS — BP 185/79 | HR 69 | Temp 96.9°F | Ht 67.0 in | Wt 210.0 lb

## 2013-07-22 DIAGNOSIS — R51 Headache: Secondary | ICD-10-CM

## 2013-07-22 DIAGNOSIS — D472 Monoclonal gammopathy: Secondary | ICD-10-CM

## 2013-07-22 MED ORDER — HYDROCODONE-ACETAMINOPHEN 5-325 MG PO TABS: 1.0000 | ORAL_TABLET | Freq: Three times a day (TID) | ORAL | Status: DC | PRN

## 2013-07-22 NOTE — Progress Notes (Signed)
  Subjective:    Patient ID: [name], female    DOB: 1951-04-11, 61 y.o.   MRN: [id number]  HPI  patient in c/o frequent headaches- has seen specialist in the past and they wanted to try botox injections but patient was hesitant. Patient has tried everything and she just can't deal with these anymore.    Review of Systems  All other systems reviewed and are negative.       Objective:   Physical Exam  Constitutional: She appears well-developed and well-nourished.  Cardiovascular: Normal rate and normal heart sounds.   Pulmonary/Chest: Effort normal and breath sounds normal.  Neurological: She is alert. No cranial nerve deficit.  Psychiatric: She has a normal mood and affect. Her behavior is normal. Judgment and thought content normal.    BP 185/79  Pulse 69  Temp(Src) 96.9 F (36.1 C) (Oral)  Ht 5\' 7"  (1.702 m)  Wt 210 lb (95.255 kg)  BMI 32.88 kg/m2       Assessment & Plan:  1. Headache(784.0) Rest in dark room Keep follow up appointment with neurologist- encouraged to try botox injections - HYDROcodone-acetaminophen (NORCO/VICODIN) 5-325 MG per tablet; Take 1 tablet by mouth every 8 (eight) hours as needed for pain.  Dispense: 40 tablet; Refill: 0  Mary-Margaret Hassell Done, FNP

## 2013-07-22 NOTE — Patient Instructions (Signed)
Headache and Allergies The relationship between allergies and headaches is unclear. Many people with allergic or infectious nasal problems also have headaches (migraines or sinus headaches). However, sometimes allergies can cause pressure that feels like a headache, and sometimes headaches can cause allergy-like symptoms. It is not always clear whether your symptoms are caused by allergies or by a headache. CAUSES   Migraine: The cause of a migraine is not always known.  Sinus Headache: The cause of a sinus headache may be a sinus infection. Other conditions that may be related to sinus headaches include:  Hay fever (allergic rhinitis).  Deviation of the nasal septum.  Swelling or clogging of the nasal passages. SYMPTOMS  Migraine headache symptoms (which often last 4 to 72 hours) include:  Intense, throbbing pain on one or both sides of the head.  Nausea.  Vomiting.  Being extra sensitive to light.  Being extra sensitive to sound.  Nervous system reactions that appear similar to an allergic reaction:  Stuffy nose.  Runny nose.  Tearing. Sinus headaches are felt as facial pain or pressure.  DIAGNOSIS  Because there is some overlap in symptoms, sinus and migraine headaches are often misdiagnosed. For example, a person with migraines may also feel facial pressure. Likewise, many people with hay fever may get migraine headaches rather than sinus headaches. These migraines can be triggered by the histamine release during an allergic reaction. An antihistamine medicine can eliminate this pain. There are standard criteria that help clarify the difference between these headaches and related allergy or allergy-like symptoms. Your caregiver can use these criteria to determine the proper diagnosis and provide you the best care. TREATMENT  Migraine medicine may help people who have persistent migraine headaches even though their hay fever is controlled. For some people, anti-inflammatory  treatments do not work to relieve migraines. Medicines called triptans (such as sumatriptan) can be helpful for those people. Document Released: 02/04/2004 Document Revised: 02/06/2012 Document Reviewed: 02/26/2010 [facility] Patient Information 2014 [place], [place].

## 2013-08-13 ENCOUNTER — Other Ambulatory Visit: Payer: Self-pay

## 2013-08-13 DIAGNOSIS — R803 Bence Jones proteinuria: Secondary | ICD-10-CM

## 2013-08-13 MED ORDER — DULOXETINE HCL 30 MG PO CPEP: ORAL_CAPSULE | ORAL | Status: DC

## 2013-08-14 ENCOUNTER — Other Ambulatory Visit: Payer: Self-pay | Admitting: General Practice

## 2013-08-20 DIAGNOSIS — F449 Dissociative and conversion disorder, unspecified: Secondary | ICD-10-CM | POA: Insufficient documentation

## 2013-08-21 ENCOUNTER — Other Ambulatory Visit: Payer: Self-pay | Admitting: *Deleted

## 2013-08-21 DIAGNOSIS — R803 Bence Jones proteinuria: Secondary | ICD-10-CM

## 2013-08-21 MED ORDER — ZOLPIDEM TARTRATE 10 MG PO TABS: 10.0000 mg | ORAL_TABLET | Freq: Every evening | ORAL | Status: DC | PRN

## 2013-08-21 NOTE — Telephone Encounter (Signed)
Please call in [place] with 1 refill

## 2013-08-21 NOTE — Telephone Encounter (Signed)
LAST RF 07/22/13. [facility] [phone]. LAST OV 07/22/13.

## 2013-08-22 ENCOUNTER — Telehealth: Payer: Self-pay | Admitting: Nurse Practitioner

## 2013-08-22 NOTE — Telephone Encounter (Signed)
Called in.

## 2013-08-25 DIAGNOSIS — E875 Hyperkalemia: Secondary | ICD-10-CM | POA: Insufficient documentation

## 2013-09-02 ENCOUNTER — Other Ambulatory Visit: Payer: Self-pay

## 2013-09-02 MED ORDER — LORAZEPAM 1 MG PO TABS: 1.0000 mg | ORAL_TABLET | Freq: Two times a day (BID) | ORAL | Status: DC | PRN

## 2013-09-02 NOTE — Telephone Encounter (Signed)
Last seen 07/22/13  MMM   Last filled 06/06/13  If approve route to nurse to phone into [facility] pharmacy  [phone]

## 2013-09-02 NOTE — Telephone Encounter (Signed)
please refill ativan

## 2013-09-03 NOTE — Telephone Encounter (Signed)
Rx [facility] into [facility]

## 2013-09-04 ENCOUNTER — Encounter (INDEPENDENT_AMBULATORY_CARE_PROVIDER_SITE_OTHER): Payer: Self-pay

## 2013-09-04 ENCOUNTER — Encounter: Payer: Self-pay | Admitting: Nurse Practitioner

## 2013-09-04 ENCOUNTER — Ambulatory Visit (INDEPENDENT_AMBULATORY_CARE_PROVIDER_SITE_OTHER): Payer: Medicare Other | Admitting: Nurse Practitioner

## 2013-09-04 VITALS — BP 134/77 | HR 71 | Temp 98.8°F | Ht 67.0 in | Wt 215.0 lb

## 2013-09-04 DIAGNOSIS — E875 Hyperkalemia: Secondary | ICD-10-CM

## 2013-09-04 DIAGNOSIS — Z862 Personal history of diseases of the blood and blood-forming organs and certain disorders involving the immune mechanism: Secondary | ICD-10-CM

## 2013-09-04 DIAGNOSIS — Z09 Encounter for follow-up examination after completed treatment for conditions other than malignant neoplasm: Secondary | ICD-10-CM

## 2013-09-04 DIAGNOSIS — Z23 Encounter for immunization: Secondary | ICD-10-CM

## 2013-09-04 DIAGNOSIS — Z8639 Personal history of other endocrine, nutritional and metabolic disease: Secondary | ICD-10-CM

## 2013-09-04 NOTE — Patient Instructions (Signed)
Hyperkalemia Hyperkalemia is when you have too much potassium in your blood. This can be a life-threatening condition. Potassium is normally removed (excreted) from the body by the kidneys. CAUSES  The potassium level in your body can become too high for the following reasons:  You take in too much potassium. You can do this by:  Using salt substitutes. They contain large amounts of potassium.  Taking potassium supplements from your caregiver. The dose may be too high for you.  Eating foods or taking nutritional products with potassium.  You excrete too little potassium. This can happen if:  Your kidneys are not functioning properly. Kidney (renal) disease is a very common cause of hyperkalemia.  You are taking medicines that lower your excretion of potassium, such as certain diuretic medicines.  You have an adrenal gland disease called Addison's disease.  You have a urinary tract obstruction, such as kidney stones.  You are on treatment to mechanically clean your blood (dialysis) and you skip a treatment.  You release a high amount of potassium from your cells into your blood. You may have a condition that causes potassium to move from your cells to your bloodstream. This can happen with:  Injury to muscles or other tissues. Most potassium is stored in the muscles.  Severe burns or infections.  Acidic blood plasma (acidosis). Acidosis can result from many diseases, such as uncontrolled diabetes. SYMPTOMS  Usually, there are no symptoms unless the potassium is dangerously high or has risen very quickly. Symptoms may include:  Irregular or very slow heartbeat.  Feeling sick to your stomach (nauseous).  Tiredness (fatigue).  Nerve problems such as tingling of the skin, numbness of the hands or feet, weakness, or paralysis. DIAGNOSIS  A simple blood test can measure the amount of potassium in your body. An electrocardiogram test of the heart can also help make the diagnosis.  The heart may beat dangerously fast or slow down and stop beating with severe hyperkalemia.  TREATMENT  Treatment depends on how bad the condition is and on the underlying cause.  If the hyperkalemia is an emergency (causing heart problems or paralysis), many different medicines can be used alone or together to lower the potassium level briefly. This may include an insulin injection even if you are not diabetic. Emergency dialysis may be needed to remove potassium from the body.  If the hyperkalemia is less severe or dangerous, the underlying cause is treated. This can include taking medicines if needed. Your prescription medicines may be changed. You may also need to take a medicine to help your body get rid of potassium. You may need to eat a diet low in potassium. HOME CARE INSTRUCTIONS   Take medicines and supplements as directed by your caregiver.  Do not take any over-the-counter medicines, supplements, natural products, herbs, or vitamins without reviewing them with your caregiver. Certain supplements and natural food products can have high amounts of potassium. Other products (such as ibuprofen) can damage weak kidneys and raise your potassium.  You may be asked to do repeat lab tests. Be sure to follow these directions.  If you have kidney disease, you may need to follow a low potassium diet. SEEK MEDICAL CARE IF:   You notice an irregular or very slow heartbeat.  You feel lightheaded.  You develop weakness that is unusual for you. SEEK IMMEDIATE MEDICAL CARE IF:   You have shortness of breath.  You have chest discomfort.  You pass out (faint). MAKE SURE YOU:   Understand   You develop weakness that is unusual for you.  SEEK IMMEDIATE MEDICAL CARE IF:    You have shortness of breath.   You have chest discomfort.   You pass out (faint).  MAKE SURE YOU:    Understand these instructions.   Will watch your condition.   Will get help right away if you are not doing well or get worse.  Document Released: 11/04/2002 Document Revised: 02/06/2012 Document Reviewed: 04/21/2011  [employer] Patient Information 2014 [employer].

## 2013-09-04 NOTE — Progress Notes (Signed)
  Subjective:    Patient ID: [name], female    DOB: 10-26-1951, 62 y.o.   MRN: [id number]  HPI Patient here today for hospital follow-up- She was in hospital for 2 days with confusion- diagnosed with hyperkalemia- since discharge she has been doing well- only complaint sh ehas is increase in headache- has follow up with neurologist 09/24/13- thinking about starting botox injections for headaches. Other wise she is doing well.    Review of Systems  Constitutional: Negative.   Respiratory: Negative.   Cardiovascular: Negative.   Gastrointestinal: Negative.   Neurological: Positive for headaches. Negative for tremors, seizures, speech difficulty, weakness and numbness.       Objective:   Physical Exam  Constitutional: She is oriented to person, place, and time. She appears well-developed and well-nourished.  Cardiovascular: Normal rate, regular rhythm and normal heart sounds.   Pulmonary/Chest: Effort normal and breath sounds normal.  Neurological: She is alert and oriented to person, place, and time.  Skin: Skin is warm.  Psychiatric: She has a normal mood and affect. Her behavior is normal. Judgment and thought content normal.    BP 134/77  Pulse 71  Temp(Src) 98.8 F (37.1 C) (Oral)  Ht 5\' 7"  (1.702 m)  Wt 215 lb (97.523 kg)  BMI 33.67 kg/m2       Assessment & Plan:   1. Hospital discharge follow-up   2. H/O hyperkalemia   reveiwed hosp records Continue all meds Labs pending RTO in 2 months for regular follow up  [place], FNP

## 2013-09-05 LAB — CMP14+EGFR
ALT: 17 IU/L (ref 0–32)
AST: 14 IU/L (ref 0–40)
Albumin/Globulin Ratio: 1.4 (ref 1.1–2.5)
Albumin: 4 g/dL (ref 3.6–4.8)
Alkaline Phosphatase: 112 IU/L (ref 39–117)
BUN/Creatinine Ratio: 14 (ref 11–26)
BUN: 17 mg/dL (ref 8–27)
CO2: 27 mmol/L (ref 18–29)
Calcium: 9.1 mg/dL (ref 8.6–10.2)
Chloride: 102 mmol/L (ref 97–108)
Creatinine, Ser: 1.22 mg/dL — ABNORMAL HIGH (ref 0.57–1.00)
GFR calc Af Amer: 55 mL/min/{1.73_m2} — ABNORMAL LOW (ref >59)
GFR calc non Af Amer: 48 mL/min/{1.73_m2} — ABNORMAL LOW (ref >59)
Globulin, Total: 2.8 g/dL (ref 1.5–4.5)
Glucose: 82 mg/dL (ref 65–99)
Potassium: 4.9 mmol/L (ref 3.5–5.2)
Sodium: 143 mmol/L (ref 134–144)
Total Bilirubin: 0.3 mg/dL (ref 0.0–1.2)
Total Protein: 6.8 g/dL (ref 6.0–8.5)

## 2013-09-17 ENCOUNTER — Telehealth: Payer: Self-pay | Admitting: Nurse Practitioner

## 2013-09-27 DIAGNOSIS — R569 Unspecified convulsions: Secondary | ICD-10-CM

## 2013-09-27 DIAGNOSIS — N183 Chronic kidney disease, stage 3 unspecified: Secondary | ICD-10-CM

## 2013-09-27 DIAGNOSIS — IMO0001 Reserved for inherently not codable concepts without codable children: Secondary | ICD-10-CM

## 2013-09-27 DIAGNOSIS — I129 Hypertensive chronic kidney disease with stage 1 through stage 4 chronic kidney disease, or unspecified chronic kidney disease: Secondary | ICD-10-CM

## 2013-10-04 ENCOUNTER — Other Ambulatory Visit: Payer: Self-pay

## 2013-10-04 NOTE — Telephone Encounter (Signed)
Last lipid 06/08/12  MMM

## 2013-10-04 NOTE — Telephone Encounter (Signed)
No refills until seen to have blood work.

## 2013-10-14 ENCOUNTER — Other Ambulatory Visit: Payer: Self-pay | Admitting: *Deleted

## 2013-10-14 MED ORDER — LORAZEPAM 1 MG PO TABS: 1.0000 mg | ORAL_TABLET | Freq: Two times a day (BID) | ORAL | Status: DC | PRN

## 2013-10-14 NOTE — Telephone Encounter (Signed)
Called into pharmacy

## 2013-10-14 NOTE — Telephone Encounter (Signed)
LAST OV 09/04/13. LAST RF 09/03/13. [facility] [phone].

## 2013-10-14 NOTE — Telephone Encounter (Signed)
Please refill ativan

## 2013-10-17 ENCOUNTER — Other Ambulatory Visit: Payer: Medicare Other

## 2013-10-17 ENCOUNTER — Other Ambulatory Visit: Payer: Self-pay | Admitting: *Deleted

## 2013-10-17 MED ORDER — SIMVASTATIN 10 MG PO TABS: 10.0000 mg | ORAL_TABLET | Freq: Every day | ORAL | Status: DC

## 2013-10-17 NOTE — Telephone Encounter (Signed)
LAST LIPID 7/13. LAST OV 09/04/13. NTBS

## 2013-10-23 ENCOUNTER — Other Ambulatory Visit: Payer: Self-pay | Admitting: *Deleted

## 2013-10-23 DIAGNOSIS — R803 Bence Jones proteinuria: Secondary | ICD-10-CM

## 2013-10-23 MED ORDER — ZOLPIDEM TARTRATE 10 MG PO TABS: 10.0000 mg | ORAL_TABLET | Freq: Every evening | ORAL | Status: DC | PRN

## 2013-10-23 NOTE — Telephone Encounter (Signed)
Please call in ambien rx 

## 2013-10-23 NOTE — Telephone Encounter (Signed)
Last filled 09/20/13, last seen 09/04/13, [facility] [phone]

## 2013-10-23 NOTE — Telephone Encounter (Signed)
Done

## 2013-10-31 ENCOUNTER — Other Ambulatory Visit: Payer: Self-pay | Admitting: *Deleted

## 2013-10-31 DIAGNOSIS — R803 Bence Jones proteinuria: Secondary | ICD-10-CM

## 2013-10-31 MED ORDER — PANTOPRAZOLE SODIUM 40 MG PO TBEC: 40.0000 mg | DELAYED_RELEASE_TABLET | Freq: Every day | ORAL | Status: DC

## 2013-11-13 ENCOUNTER — Ambulatory Visit: Payer: Medicare Other | Admitting: Nurse Practitioner

## 2013-11-18 ENCOUNTER — Other Ambulatory Visit: Payer: Self-pay

## 2013-11-18 NOTE — Telephone Encounter (Signed)
Last lipid 06/08/12  MMM

## 2013-11-19 MED ORDER — SIMVASTATIN 10 MG PO TABS: 10.0000 mg | ORAL_TABLET | Freq: Every day | ORAL | Status: DC

## 2013-11-19 NOTE — Telephone Encounter (Signed)
NTBS.

## 2013-11-27 ENCOUNTER — Other Ambulatory Visit: Payer: Medicare Other

## 2013-11-29 ENCOUNTER — Other Ambulatory Visit: Payer: Self-pay | Admitting: Family Medicine

## 2013-12-09 ENCOUNTER — Ambulatory Visit (INDEPENDENT_AMBULATORY_CARE_PROVIDER_SITE_OTHER): Payer: Medicare Other | Admitting: Nurse Practitioner

## 2013-12-09 ENCOUNTER — Encounter: Payer: Self-pay | Admitting: Nurse Practitioner

## 2013-12-09 ENCOUNTER — Ambulatory Visit (INDEPENDENT_AMBULATORY_CARE_PROVIDER_SITE_OTHER): Payer: Medicare Other

## 2013-12-09 ENCOUNTER — Other Ambulatory Visit (HOSPITAL_COMMUNITY): Payer: Medicare Other

## 2013-12-09 VITALS — BP 186/87 | HR 84 | Temp 98.1°F | Ht 67.0 in | Wt 219.0 lb

## 2013-12-09 DIAGNOSIS — M25559 Pain in unspecified hip: Secondary | ICD-10-CM

## 2013-12-09 DIAGNOSIS — G4700 Insomnia, unspecified: Secondary | ICD-10-CM

## 2013-12-09 DIAGNOSIS — M25551 Pain in right hip: Secondary | ICD-10-CM

## 2013-12-09 DIAGNOSIS — F3289 Other specified depressive episodes: Secondary | ICD-10-CM

## 2013-12-09 DIAGNOSIS — E785 Hyperlipidemia, unspecified: Secondary | ICD-10-CM

## 2013-12-09 DIAGNOSIS — I10 Essential (primary) hypertension: Secondary | ICD-10-CM

## 2013-12-09 DIAGNOSIS — F32A Depression, unspecified: Secondary | ICD-10-CM

## 2013-12-09 DIAGNOSIS — F329 Major depressive disorder, single episode, unspecified: Secondary | ICD-10-CM

## 2013-12-09 DIAGNOSIS — R569 Unspecified convulsions: Secondary | ICD-10-CM

## 2013-12-09 MED ORDER — LORAZEPAM 1 MG PO TABS: 1.0000 mg | ORAL_TABLET | Freq: Two times a day (BID) | ORAL | Status: DC | PRN

## 2013-12-09 MED ORDER — DULOXETINE HCL 60 MG PO CPEP: 60.0000 mg | ORAL_CAPSULE | Freq: Every day | ORAL | Status: DC

## 2013-12-09 NOTE — Progress Notes (Signed)
Subjective:    Patient ID: [name], female    DOB: 1951-08-17, 63 y.o.   MRN: [id number]  [facility]- SHe says that her hip has been hurting for over a week and it gave way with her earlier today and she fell and landed on her tail bone. Denies hittong her head when she fail- Able to walk with minimal pain. Patient Active Problem List   Diagnosis Date Noted  . Insomnia 03/25/2013  . Depression 03/25/2013  . Seizures 03/25/2013  . Hyperlipidemia 03/25/2013  . 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 HYPERTENSION, UNSPECIFIED 04/21/2010  . SHORTNESS OF BREATH 04/21/2010  . CHEST PAIN-UNSPECIFIED 04/21/2010   Outpatient Encounter Prescriptions as of 12/09/2013  Medication Sig  . aspirin EC 81 MG tablet Take 81 mg by mouth daily.  [name] atenolol (TENORMIN) 100 MG tablet Take 50 mg by mouth at bedtime.   . baclofen (LIORESAL) 10 MG tablet   . cholecalciferol (VITAMIN D) 1000 UNITS tablet Take 1,000 Units by mouth daily.  . DULoxetine (CYMBALTA) 30 MG capsule Take one daily  . DULoxetine (CYMBALTA) 60 MG capsule Take 60 mg by mouth daily.  [name] gabapentin (NEURONTIN) 300 MG capsule   . LORazepam (ATIVAN) 1 MG tablet Take 1 tablet (1 mg total) by mouth 2 (two) times daily as needed for anxiety.  . pantoprazole (PROTONIX) 40 MG tablet Take 1 tablet (40 mg total) by mouth daily.  . simvastatin (ZOCOR) 10 MG tablet Take 1 tablet (10 mg total) by mouth daily.  . sodium bicarbonate 650 MG tablet Take 650 mg by mouth 3 (three) times daily.  [name] zolpidem (AMBIEN) 10 MG tablet Take 1 tablet (10 mg total) by mouth at bedtime as needed. For sleep  . [DISCONTINUED] DULoxetine (CYMBALTA) 30 MG capsule 60 mg. One tablet daily  . [DISCONTINUED] HYDROcodone-acetaminophen (NORCO/VICODIN) 5-325 MG per tablet Take 1 tablet by mouth every 8 (eight) hours as needed for pain.  . [DISCONTINUED] levETIRAcetam (KEPPRA) 250 MG tablet Take  500 mg by mouth every 12 (twelve) hours. 556m am and 1000 mg pm       Review of Systems  Constitutional: Negative.   HENT: Negative.   Respiratory: Negative.   Cardiovascular: Negative.   Gastrointestinal: Negative.   Musculoskeletal: Positive for arthralgias.  Neurological: Negative.   Hematological: Negative.   Psychiatric/Behavioral: Negative.   All other systems reviewed and are negative.       Objective:   Physical Exam  Constitutional: She is oriented to person, place, and time. She appears well-developed and well-nourished.  HENT:  Nose: Nose normal.  Mouth/Throat: Oropharynx is clear and moist.  Eyes: EOM are normal.  Neck: Trachea normal, normal range of motion and full passive range of motion without pain. Neck supple. No JVD present. Carotid bruit is not present. No thyromegaly present.  Cardiovascular: Normal rate, regular rhythm, normal heart sounds and intact distal pulses.  Exam reveals no gallop and no friction rub.   No murmur heard. Pulmonary/Chest: Effort normal and breath sounds normal.  Abdominal: Soft. Bowel sounds are normal. She exhibits no distension and no mass. There is no tenderness.  Musculoskeletal: Normal range of motion.  Lymphadenopathy:    She has no cervical adenopathy.  Neurological: She is alert and oriented to person, place, and time. She has normal reflexes.  Skin: Skin is warm and dry.  Psychiatric: She has a normal mood and affect. Her behavior is normal.  Judgment and thought content normal.   BP 186/87  Pulse 84  Temp(Src) 98.1 F (36.7 C) (Oral)  Ht _0  (1.702 m)  Wt 219 lb (99.338 kg)  BMI 34.29 kg/m2 Right hip x ray- normal-Preliminary reading by Ronnald Collum, FNP  [facility]        Assessment & Plan:    1. Insomnia   2. HYPERTENSION, UNSPECIFIED   3. Hyperlipidemia   4. Depression   5. Seizures   6. Right hip pain    Orders Placed This Encounter  Procedures  . DG Hip Complete Right    Standing Status: Future      Number of Occurrences: 1     Standing Expiration Date: 02/08/2015    Order Specific Question:  Reason for Exam (SYMPTOM  OR DIAGNOSIS REQUIRED)    Answer:  right hip pain    Order Specific Question:  Preferred imaging location?    Answer:  Internal  . CMP14+EGFR  . NMR, lipoprofile   Meds ordered this encounter  Medications  . DULoxetine (CYMBALTA) 60 MG capsule    Sig: Take 1 capsule (60 mg total) by mouth daily.    Dispense:  30 capsule    Refill:  5    Order Specific Question:  Supervising Provider    Answer:  Chipper Herb [[id number]]  . LORazepam (ATIVAN) 1 MG tablet    Sig: Take 1 tablet (1 mg total) by mouth 2 (two) times daily as needed for anxiety.    Dispense:  60 tablet    Refill:  0    Order Specific Question:  Supervising Provider    Answer:  Chipper Herb [[id number]]   Rest hip- heat if helps Continue all meds Labs pending Diet and exercise encouraged Health maintenance reviewed Follow up in 3 months  [place], FNP

## 2013-12-09 NOTE — Patient Instructions (Signed)
Hip Pain  The hips join the upper legs to the lower pelvis. The bones, cartilage, tendons, and muscles of the hip joint perform a lot of work each day holding your body weight and allowing you to move around.  Hip pain is a common symptom. It can range from a minor ache to severe pain on 1 or both hips. Pain may be felt on the inside of the hip joint near the groin, or the outside near the buttocks and upper thigh. There may be swelling or stiffness as well. It occurs more often when a person walks or performs activity. There are many reasons hip pain can develop.  CAUSES   It is important to work with your caregiver to identify the cause since many conditions can impact the bones, cartilage, muscles, and tendons of the hips. Causes for hip pain include:   Broken (fractured) bones.   Separation of the thighbone from the hip socket (dislocation).   Torn cartilage of the hip joint.   Swelling (inflammation) of a tendon (tendonitis), the sac within the hip joint (bursitis), or a joint.   A weakening in the abdominal wall (hernia), affecting the nerves to the hip.   Arthritis in the hip joint or lining of the hip joint.   Pinched nerves in the back, hip, or upper thigh.   A bulging disc in the spine (herniated disc).   Rarely, bone infection or cancer.  DIAGNOSIS   The location of your hip pain will help your caregiver understand what may be causing the pain. A diagnosis is based on your medical history, your symptoms, results from your physical exam, and results from diagnostic tests. Diagnostic tests may include X-ray exams, a computerized magnetic scan (magnetic resonance imaging, MRI), or bone scan.  TREATMENT   Treatment will depend on the cause of your hip pain. Treatment may include:   Limiting activities and resting until symptoms improve.   Crutches or other walking supports (a cane or brace).   Ice, elevation, and compression.   Physical therapy or home exercises.   Shoe inserts or special  shoes.   Losing weight.   Medications to reduce pain.   Undergoing surgery.  HOME CARE INSTRUCTIONS    Only take over-the-counter or prescription medicines for pain, discomfort, or fever as directed by your caregiver.   Put ice on the injured area:   Put ice in a plastic bag.   Place a towel between your skin and the bag.   Leave the ice on for 15-20 minutes at a time, 03-04 times a day.   Keep your leg raised (elevated) when possible to lessen swelling.   Avoid activities that cause pain.   Follow specific exercises as directed by your caregiver.   Sleep with a pillow between your legs on your most comfortable side.   Record how often you have hip pain, the location of the pain, and what it feels like. This information may be helpful to you and your caregiver.   Ask your caregiver about returning to work or sports and whether you should drive.   Follow up with your caregiver for further exams, therapy, or testing as directed.  SEEK MEDICAL CARE IF:    Your pain or swelling continues or worsens after 1 week.   You are feeling unwell or have chills.   You have increasing difficulty with walking.   You have a loss of sensation or other new symptoms.   You have questions or concerns.  SEEK   IMMEDIATE MEDICAL CARE IF:    You cannot put weight on the affected hip.   You have fallen.   You have a sudden increase in pain and swelling in your hip.   You have a fever.  MAKE SURE YOU:    Understand these instructions.   Will watch your condition.   Will get help right away if you are not doing well or get worse.  Document Released: 05/04/2010 Document Revised: 02/06/2012 Document Reviewed: 05/04/2010  [employer] Patient Information 2014 [employer].

## 2013-12-11 ENCOUNTER — Ambulatory Visit (HOSPITAL_COMMUNITY): Payer: Medicare Other

## 2013-12-23 ENCOUNTER — Other Ambulatory Visit (HOSPITAL_COMMUNITY): Payer: Medicare Other

## 2013-12-29 NOTE — Progress Notes (Signed)
Rescheduled

## 2013-12-30 ENCOUNTER — Ambulatory Visit (HOSPITAL_COMMUNITY): Payer: Medicare Other | Admitting: Oncology

## 2014-01-01 ENCOUNTER — Other Ambulatory Visit: Payer: Self-pay | Admitting: *Deleted

## 2014-01-01 DIAGNOSIS — R803 Bence Jones proteinuria: Secondary | ICD-10-CM

## 2014-01-01 MED ORDER — DULOXETINE HCL 30 MG PO CPEP
ORAL_CAPSULE | ORAL | Status: DC
Start: 2014-01-01 — End: 2014-02-24

## 2014-01-01 MED ORDER — ZOLPIDEM TARTRATE 10 MG PO TABS: 10.0000 mg | ORAL_TABLET | Freq: Every evening | ORAL | Status: DC | PRN

## 2014-01-01 NOTE — Telephone Encounter (Signed)
Please call in ambien with 1 refills 

## 2014-01-01 NOTE — Telephone Encounter (Signed)
Patient last seen in office on 12-09-13. Rx last filled on 10-23-13 with 1 rf. Please advise. If approved please route to Pool B so nurse can phone in to family pharmacy

## 2014-01-01 NOTE — Telephone Encounter (Signed)
Patient last seen in office on

## 2014-01-01 NOTE — Telephone Encounter (Signed)
rx called into pharmacy

## 2014-01-02 ENCOUNTER — Telehealth: Payer: Self-pay | Admitting: Nurse Practitioner

## 2014-01-02 NOTE — Progress Notes (Signed)
-  No show-  [name]  

## 2014-01-03 ENCOUNTER — Ambulatory Visit (HOSPITAL_COMMUNITY): Payer: Medicare Other | Admitting: Oncology

## 2014-01-03 ENCOUNTER — Other Ambulatory Visit (HOSPITAL_COMMUNITY): Payer: Medicare Other

## 2014-01-22 ENCOUNTER — Other Ambulatory Visit: Payer: Self-pay | Admitting: *Deleted

## 2014-01-22 MED ORDER — SIMVASTATIN 10 MG PO TABS: 10.0000 mg | ORAL_TABLET | Freq: Every day | ORAL | Status: DC

## 2014-01-28 ENCOUNTER — Other Ambulatory Visit: Payer: Self-pay | Admitting: Nurse Practitioner

## 2014-01-28 DIAGNOSIS — Z1231 Encounter for screening mammogram for malignant neoplasm of breast: Secondary | ICD-10-CM

## 2014-01-29 ENCOUNTER — Encounter: Payer: Self-pay | Admitting: *Deleted

## 2014-01-30 ENCOUNTER — Other Ambulatory Visit: Payer: Self-pay

## 2014-01-30 MED ORDER — LORAZEPAM 1 MG PO TABS: 1.0000 mg | ORAL_TABLET | Freq: Two times a day (BID) | ORAL | Status: DC | PRN

## 2014-01-30 NOTE — Telephone Encounter (Signed)
Called in.

## 2014-01-30 NOTE — Telephone Encounter (Signed)
Please call in ativan with 1 refills 

## 2014-01-30 NOTE — Telephone Encounter (Signed)
Last seen 1/12.15  MMM If approved route to nurse to call into [facility]   [phone]

## 2014-02-04 ENCOUNTER — Ambulatory Visit (HOSPITAL_COMMUNITY): Payer: Medicare Other | Attending: Nurse Practitioner

## 2014-02-17 NOTE — Progress Notes (Addendum)
[name], [name] [id number]  MGUS (monoclonal gammopathy of unknown significance) - Plan: CBC with Differential, Comprehensive metabolic panel, Lactate dehydrogenase, C-reactive protein, Beta 2 microglobuline, serum, Multiple myeloma panel, serum, Kappa/lambda light chains  CURRENT THERAPY: Observation  INTERVAL HISTORY: [name] 63 y.o. female returns for  regular  visit for followup of IgG Kappa MGUS.  I personally reviewed and went over laboratory results with the patient.  The results follow below.  I personally reviewed and went over radiographic studies with the patient.  Results follow below.   I provided the patient education regarding MGUS: MGUS is defined as a serum monoclonal protein (M-Protein) at a concentration of <3 g/dL, a bone marrow with < 10% monoclonal plasma cells, and absence of end-organ damage (lytic bone lesions, anemia, hypercalcemia, renal insufficiency, hyperviscosity) related to the proliferative process.  Non-IgM MGUS (IgG, IgA, or IgD MGUS is the most common subtype of MGUS and has the potential to progress to smoldering (asymptomatic) multiple myeloma and to symptomatic multiple myeloma.  Less frequently, these patients progress to AL amyloidosis, light chain deposition disease, or another lymphoproliferative disorder.  IgM MGUS accounts for approximately 15% of MGUS cases.  It is considered separately from non-IgM MGUS because it has the potential to progress to smoldering Waldenstrom macroglobinemia and to symptomatic Waldenstrom macroglobulinemia and less often to lymphoma or AL amyloidosis.  Infrequently, IgM MGUS can progress to IgM multiple myeloma.Light chain MGUS (LC-MGUS) is a unique subtype of MGUS in which the secreted monoclonal protein lacks the immunoglobulin heavy chain component. LC-MGUS may progress to idiopathic Bence Jones proteinemia and to light chain multiple myeloma, AL amyloidosis, or light chain deposition  disease.   We will monitor for any CRAB findings: C= Calcium increase of greater than 11.5 mg/dL R = Renal insufficiency with creatinine > 2 mg/dL A= Anemia with Hgb < 10 g/dL B= Bone disease with lytic lesions and/or osteopenia  She notes a headache which she has a history of.  She denies the headache being her worst headache ever.  She recently saw her neurologist in [place] for this.  She is receiving botox injections for her headaches.  I will defer management to her neurologist.   She denies any bone pains or new issues.  Hematologically, she denies any complaints and ROS questioning is negative.   Past Medical History  Diagnosis Date  . Renal insufficiency   . Seizures   . Chronic headaches   . Hypertension   . Osteoporosis   . Depression   . MGUS (monoclonal gammopathy of unknown significance) 03/25/2013    has HYPERTENSION, UNSPECIFIED; SHORTNESS OF BREATH; CHEST PAIN-UNSPECIFIED; Insomnia; Depression; Seizures; Hyperlipidemia; and MGUS (monoclonal gammopathy of unknown significance) on her problem list.     is allergic to asa; penicillins; sulfonamide derivatives; and tylenol with codeine #3.  Ms. Olejniczak does not currently have medications on file.  Past Surgical History  Procedure Laterality Date  . Breast reduction surgery    . Abdominal hysterectomy    . Lung lobectomy      rt lung-cyst   . Appendectomy    . Cholecystectomy    . Back surgery  2002    Denies any headaches, dizziness, double vision, fevers, chills, night sweats, nausea, vomiting, diarrhea, constipation, chest pain, heart palpitations, shortness of breath, blood in stool, black tarry stool, urinary pain, urinary burning, urinary frequency, hematuria.   PHYSICAL EXAMINATION  ECOG PERFORMANCE STATUS: 1 - Symptomatic but completely ambulatory  Filed Vitals:  02/19/14 1500  BP: 152/92  Pulse: 90  Temp: 98.1 F (36.7 C)  Resp: 20    GENERAL:alert, no distress, well nourished,  well developed, comfortable, cooperative, obese and smiling SKIN: skin color, texture, turgor are normal, no rashes or significant lesions HEAD: Normocephalic, No masses, lesions, tenderness or abnormalities EYES: normal, PERRLA, EOMI, Conjunctiva are pink and non-injected EARS: External ears normal OROPHARYNX:mucous membranes are moist  NECK: supple, no adenopathy, thyroid normal size, non-tender, without nodularity, no stridor, non-tender, trachea midline LYMPH:  no palpable lymphadenopathy BREAST:not examined LUNGS: clear to auscultation and percussion HEART: regular rate & rhythm, no murmurs and no gallops ABDOMEN:abdomen soft, non-tender and normal bowel sounds BACK: Back symmetric, no curvature. EXTREMITIES:less then 2 second capillary refill, no joint deformities, effusion, or inflammation, no edema, no skin discoloration, no clubbing, no cyanosis  NEURO: alert & oriented x 3 with fluent speech, no focal motor/sensory deficits   LABORATORY DATA: CBC    Component Value Date/Time   WBC 4.9 06/03/2013 1415   RBC 4.11 06/03/2013 1415   HGB 13.0 06/03/2013 1415   HCT 39.8 06/03/2013 1415   PLT 340 06/03/2013 1415   MCV 96.8 06/03/2013 1415   MCH 31.6 06/03/2013 1415   MCHC 32.7 06/03/2013 1415   RDW 12.3 06/03/2013 1415   LYMPHSABS 1.9 06/03/2013 1415   MONOABS 0.3 06/03/2013 1415   EOSABS 0.2 06/03/2013 1415   BASOSABS 0.1 06/03/2013 1415      Chemistry      Component Value Date/Time   NA 143 09/04/2013 1243   NA 140 06/03/2013 1415   K 4.9 09/04/2013 1243   CL 102 09/04/2013 1243   CO2 27 09/04/2013 1243   BUN 17 09/04/2013 1243   BUN 21 06/03/2013 1415   CREATININE 1.22* 09/04/2013 1243   CREATININE 1.40* 06/05/2013 1103      Component Value Date/Time   CALCIUM 9.1 09/04/2013 1243   ALKPHOS 112 09/04/2013 1243   AST 14 09/04/2013 1243   ALT 17 09/04/2013 1243   BILITOT 0.3 09/04/2013 1243     Results for ELESA, GARMAN (MRN [id number]) as of 12/29/2013 09:56  Ref. Range 06/05/2013 11:05   Immunofixation, Urine No range found (NOTE)  Time-UPE24 No range found 24  Volume, Urine-UPE24 No range found 1500  Total Protein, Urine-UPE24 No range found 3.2  Total Protein, Urine-Ur/day Latest Range: 10-140 mg/day 48  ALBUMIN, U Latest Range: DETECTED  DETECTED  Alpha 1, Urine Latest Range: NONE DETECTED  DETECTED (A)  Alpha 2, Urine Latest Range: NONE DETECTED  DETECTED (A)  Beta, Urine Latest Range: NONE DETECTED  DETECTED (A)  Gamma Globulin, Urine Latest Range: NONE DETECTED  DETECTED (A)  Free Kappa Lt Chains,Ur Latest Range: 0.14-2.42 mg/dL 0.99  Free Lt Chn Excr Rate No range found 14.85  Free Lambda Lt Chains,Ur Latest Range: 0.02-0.67 mg/dL 0.11  Free Lambda Excretion/Day No range found 1.65  Free Kappa/Lambda Ratio Latest Range: 2.04-10.37 ratio 9.00   Results for TAREN, DYMEK (MRN [id number]) as of 12/29/2013 09:56  Ref. Range 06/05/2013 11:05  Time-UPE24 No range found 24  Volume, Urine-UPE24 No range found 1500  Total Protein, Urine-UPE24 No range found 3.2  Total Protein, Urine-Ur/day Latest Range: 10-140 mg/day 48  ALBUMIN, U Latest Range: DETECTED  DETECTED  Alpha 1, Urine Latest Range: NONE DETECTED  DETECTED (A)  Alpha 2, Urine Latest Range: NONE DETECTED  DETECTED (A)  Beta, Urine Latest Range: NONE DETECTED  DETECTED (A)  Gamma Globulin, Urine Latest Range: NONE  DETECTED  DETECTED (A)  Free Kappa Lt Chains,Ur Latest Range: 0.14-2.42 mg/dL 0.99  Free Lt Chn Excr Rate No range found 14.85  Free Lambda Lt Chains,Ur Latest Range: 0.02-0.67 mg/dL 0.11  Free Lambda Excretion/Day No range found 1.65  Free Kappa/Lambda Ratio Latest Range: 2.04-10.37 ratio 9.00  Immunofixation, Urine No range found (NOTE)      RADIOGRAPHIC STUDIES:  12/10/2013  CLINICAL DATA: Right hip pain.  EXAM:  RIGHT HIP - COMPLETE 2+ VIEW  COMPARISON: Apr 12, 2013.  FINDINGS:  There is no evidence of hip fracture or dislocation. There is no  evidence of arthropathy or other focal  bone abnormality.  IMPRESSION:  Normal right hip.  Electronically Signed  By: Sabino Dick M.D.  On: 12/10/2013 07:53    ASSESSMENT:  1. IgG MGUS stable at this time without any evidence to suggest progression to multiple myeloma or smoldering myeloma. Patient is in the low intermediate risk category with absolute risk of Progression over 20 years of about 21% (Rajkumar SV, et al ; Blood. 2005 Aug 1;106(3):812-7. Epub 2005 Apr 26) due to one risk factor of abnormal light chain ratio. 2. Headaches, chronic.  Followed by Neurology in [facility]  Patient Active Problem List   Diagnosis Date Noted  . Insomnia 03/25/2013  . Depression 03/25/2013  . Seizures 03/25/2013  . Hyperlipidemia 03/25/2013  . 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 HYPERTENSION, UNSPECIFIED 04/21/2010  . SHORTNESS OF BREATH 04/21/2010  . CHEST PAIN-UNSPECIFIED 04/21/2010      PLAN:  1. I personally reviewed and went over laboratory results with the patient.  The results follow below. 2. I personally reviewed and went over radiographic studies with the patient.  Results follow below.  3. Patient education regarding MGUS 4. She is overdue for her Mammogram which as done last in 11/27/2012 per [facility].  She is reminded to have this annual screening test performed in the near future. 5. Labs today: CBC diff, CMET, B2M, and MM Panel 6. Labs in 6 months: CBC diff, CMET, B2M, LDH, CRP, and MM Panel 7. Return in 6 months for follow-up   THERAPY PLAN:  From a hematologic standpoint she seems stable.  We will perform labs today to confirm stability and we will monitor for CRAB findings.  All questions were answered. The patient knows to call the clinic with any problems, questions or concerns. We can certainly see the patient much sooner if necessary.  Patient and plan discussed with Dr. Farrel Gobble and he is in agreement with the aforementioned.   Josh Nicolosi 02/19/2014      Addendum: Labs reported.  Patient's creatinine level is >2.00, IgG is elevated, Ca is WNL with an albumin WNL, Kappa light chain and kappa lambda light chain ratio is increased, B2M is increased as well.    With this information, bone marrow aspiration and biopsy with flow and cytogenetics is indicated.  Order placed for interventional radiology to perform the procedure.   Aristides Luckey 02/21/2014

## 2014-02-19 ENCOUNTER — Encounter (HOSPITAL_COMMUNITY): Payer: Self-pay | Admitting: Oncology

## 2014-02-19 ENCOUNTER — Encounter (HOSPITAL_COMMUNITY): Payer: Medicare Other | Attending: Oncology | Admitting: Oncology

## 2014-02-19 VITALS — BP 152/92 | HR 90 | Temp 98.1°F | Resp 20 | Wt 219.3 lb

## 2014-02-19 DIAGNOSIS — I10 Essential (primary) hypertension: Secondary | ICD-10-CM | POA: Insufficient documentation

## 2014-02-19 DIAGNOSIS — E785 Hyperlipidemia, unspecified: Secondary | ICD-10-CM | POA: Insufficient documentation

## 2014-02-19 DIAGNOSIS — Z09 Encounter for follow-up examination after completed treatment for conditions other than malignant neoplasm: Secondary | ICD-10-CM | POA: Insufficient documentation

## 2014-02-19 DIAGNOSIS — G4700 Insomnia, unspecified: Secondary | ICD-10-CM | POA: Insufficient documentation

## 2014-02-19 DIAGNOSIS — F329 Major depressive disorder, single episode, unspecified: Secondary | ICD-10-CM | POA: Insufficient documentation

## 2014-02-19 DIAGNOSIS — R569 Unspecified convulsions: Secondary | ICD-10-CM | POA: Insufficient documentation

## 2014-02-19 DIAGNOSIS — M810 Age-related osteoporosis without current pathological fracture: Secondary | ICD-10-CM | POA: Insufficient documentation

## 2014-02-19 DIAGNOSIS — D472 Monoclonal gammopathy: Secondary | ICD-10-CM | POA: Insufficient documentation

## 2014-02-19 DIAGNOSIS — F3289 Other specified depressive episodes: Secondary | ICD-10-CM | POA: Insufficient documentation

## 2014-02-19 DIAGNOSIS — N289 Disorder of kidney and ureter, unspecified: Secondary | ICD-10-CM | POA: Insufficient documentation

## 2014-02-19 LAB — CBC WITH DIFFERENTIAL/PLATELET
Basophils Absolute: 0.1 10*3/uL (ref 0.0–0.1)
Basophils Relative: 1 % (ref 0–1)
Eosinophils Absolute: 0.2 10*3/uL (ref 0.0–0.7)
Eosinophils Relative: 3 % (ref 0–5)
HCT: 47.7 % — ABNORMAL HIGH (ref 36.0–46.0)
Hemoglobin: 15.1 g/dL — ABNORMAL HIGH (ref 12.0–15.0)
Lymphocytes Relative: 25 % (ref 12–46)
Lymphs Abs: 1.5 10*3/uL (ref 0.7–4.0)
MCH: 30.9 pg (ref 26.0–34.0)
MCHC: 31.7 g/dL (ref 30.0–36.0)
MCV: 97.7 fL (ref 78.0–100.0)
Monocytes Absolute: 0.3 10*3/uL (ref 0.1–1.0)
Monocytes Relative: 5 % (ref 3–12)
Neutro Abs: 4 10*3/uL (ref 1.7–7.7)
Neutrophils Relative %: 66 % (ref 43–77)
Platelets: 388 10*3/uL (ref 150–400)
RBC: 4.88 MIL/uL (ref 3.87–5.11)
RDW: 13.3 % (ref 11.5–15.5)
WBC: 6.1 10*3/uL (ref 4.0–10.5)

## 2014-02-19 LAB — COMPREHENSIVE METABOLIC PANEL
ALT: 17 U/L (ref 0–35)
AST: 31 U/L (ref 0–37)
Albumin: 3.9 g/dL (ref 3.5–5.2)
Alkaline Phosphatase: 141 U/L — ABNORMAL HIGH (ref 39–117)
BUN: 25 mg/dL — ABNORMAL HIGH (ref 6–23)
CO2: 27 mEq/L (ref 19–32)
Calcium: 9.8 mg/dL (ref 8.4–10.5)
Chloride: 102 mEq/L (ref 96–112)
Creatinine, Ser: 2.1 mg/dL — ABNORMAL HIGH (ref 0.50–1.10)
GFR calc Af Amer: 28 mL/min — ABNORMAL LOW (ref >90)
GFR calc non Af Amer: 24 mL/min — ABNORMAL LOW (ref >90)
Glucose, Bld: 105 mg/dL — ABNORMAL HIGH (ref 70–99)
Potassium: 4.7 mEq/L (ref 3.7–5.3)
Sodium: 142 mEq/L (ref 137–147)
Total Bilirubin: 0.5 mg/dL (ref 0.3–1.2)
Total Protein: 9.1 g/dL — ABNORMAL HIGH (ref 6.0–8.3)

## 2014-02-19 NOTE — Patient Instructions (Signed)
[facility] Discharge Instructions  Return in 6 months for office visit and lab work.  Thank you for choosing [facility] to provide your oncology and hematology care.  To afford each patient quality time with our providers, please arrive at least 15 minutes before your scheduled appointment time.  With your help, our goal is to use those 15 minutes to complete the necessary work-up to ensure our physicians have the information they need to help with your evaluation and healthcare recommendations.    Effective January 1st, 2014, we ask that you re-schedule your appointment with our physicians should you arrive 10 or more minutes late for your appointment.  We strive to give you quality time with our providers, and arriving late affects you and other patients whose appointments are after yours.    Again, thank you for choosing [facility].  Our hope is that these requests will decrease the amount of time that you wait before being seen by our physicians.       _____________________________________________________________  Should you have questions after your visit to [facility], please contact our office at [phone] between the hours of 8:30 a.m. and 5:00 p.m.  Voicemails left after 4:30 p.m. will not be returned until the following business day.  For prescription refill requests, have your pharmacy contact our office with your prescription refill request.

## 2014-02-19 NOTE — Progress Notes (Signed)
[name] presented for [employer]. Labs per MD order drawn via Peripheral Line 23 gauge needle inserted in right forearm.  Good blood return present. Procedure without incident.  Needle removed intact. Patient tolerated procedure well.

## 2014-02-20 LAB — KAPPA/LAMBDA LIGHT CHAINS
Kappa free light chain: 6.04 mg/dL — ABNORMAL HIGH (ref 0.33–1.94)
Kappa, lambda light chain ratio: 2.55 — ABNORMAL HIGH (ref 0.26–1.65)
Lambda free light chains: 2.37 mg/dL (ref 0.57–2.63)

## 2014-02-21 ENCOUNTER — Other Ambulatory Visit (HOSPITAL_COMMUNITY): Payer: Self-pay | Admitting: Oncology

## 2014-02-21 DIAGNOSIS — D472 Monoclonal gammopathy: Secondary | ICD-10-CM

## 2014-02-21 LAB — MULTIPLE MYELOMA PANEL, SERUM
Albumin ELP: 51.3 % — ABNORMAL LOW (ref 55.8–66.1)
Alpha-1-Globulin: 6.2 % — ABNORMAL HIGH (ref 2.9–4.9)
Alpha-2-Globulin: 11 % (ref 7.1–11.8)
Beta 2: 5.1 % (ref 3.2–6.5)
Beta Globulin: 7 % (ref 4.7–7.2)
Gamma Globulin: 19.4 % — ABNORMAL HIGH (ref 11.1–18.8)
IgA: 295 mg/dL (ref 69–380)
IgG (Immunoglobin G), Serum: 1630 mg/dL — ABNORMAL HIGH (ref 690–1700)
IgM, Serum: 256 mg/dL (ref 52–322)
M-Spike, %: 0.64 g/dL
Total Protein: 8.1 g/dL (ref 6.0–8.3)

## 2014-02-21 LAB — BETA 2 MICROGLOBULIN, SERUM: Beta-2 Microglobulin: 5.15 mg/L — ABNORMAL HIGH (ref <=2.51)

## 2014-02-21 NOTE — Addendum Note (Signed)
Addended by: Baird Cancer on: 02/21/2014 12:02 PM   Modules accepted: Level of Service

## 2014-02-24 ENCOUNTER — Telehealth (HOSPITAL_COMMUNITY): Payer: Self-pay

## 2014-02-24 ENCOUNTER — Other Ambulatory Visit: Payer: Self-pay | Admitting: Radiology

## 2014-02-24 ENCOUNTER — Encounter (HOSPITAL_COMMUNITY): Payer: Self-pay | Admitting: Pharmacy Technician

## 2014-02-24 NOTE — Telephone Encounter (Signed)
error 

## 2014-02-25 ENCOUNTER — Other Ambulatory Visit: Payer: Self-pay | Admitting: *Deleted

## 2014-02-25 DIAGNOSIS — R803 Bence Jones proteinuria: Secondary | ICD-10-CM

## 2014-02-25 MED ORDER — ZOLPIDEM TARTRATE 10 MG PO TABS: 10.0000 mg | ORAL_TABLET | Freq: Every evening | ORAL | Status: DC | PRN

## 2014-02-25 NOTE — Telephone Encounter (Signed)
Please call in ambien with 1 refills 

## 2014-02-25 NOTE — Telephone Encounter (Signed)
Called in.

## 2014-02-25 NOTE — Telephone Encounter (Signed)
Last ov 1/15. Last refill 01/27/14. Call in [place] [phone].

## 2014-02-27 ENCOUNTER — Ambulatory Visit (HOSPITAL_COMMUNITY)
Admission: RE | Admit: 2014-02-27 | Discharge: 2014-02-27 | Disposition: A | Discharge status: Home / Self Care | Payer: Medicare Other | Source: Ambulatory Visit | Attending: Oncology | Admitting: Oncology

## 2014-02-27 ENCOUNTER — Encounter (HOSPITAL_COMMUNITY): Payer: Self-pay

## 2014-02-27 DIAGNOSIS — D72822 Plasmacytosis: Secondary | ICD-10-CM | POA: Insufficient documentation

## 2014-02-27 DIAGNOSIS — D472 Monoclonal gammopathy: Secondary | ICD-10-CM | POA: Diagnosis present

## 2014-02-27 LAB — CBC
HCT: 37.7 % (ref 36.0–46.0)
Hemoglobin: 12.3 g/dL (ref 12.0–15.0)
MCH: 31.7 pg (ref 26.0–34.0)
MCHC: 32.6 g/dL (ref 30.0–36.0)
MCV: 97.2 fL (ref 78.0–100.0)
Platelets: 359 10*3/uL (ref 150–400)
RBC: 3.88 MIL/uL (ref 3.87–5.11)
RDW: 13.7 % (ref 11.5–15.5)
WBC: 8.6 10*3/uL (ref 4.0–10.5)

## 2014-02-27 LAB — BONE MARROW EXAM: Bone Marrow Exam: 234

## 2014-02-27 MED ORDER — FENTANYL CITRATE 0.05 MG/ML IJ SOLN
INTRAMUSCULAR | Status: AC
  Filled 2014-02-27: qty 4

## 2014-02-27 MED ORDER — SODIUM CHLORIDE 0.9 % IV SOLN
INTRAVENOUS | Status: DC
  Administered 2014-02-27: 250 mL via INTRAVENOUS

## 2014-02-27 MED ORDER — FENTANYL CITRATE 0.05 MG/ML IJ SOLN
INTRAMUSCULAR | Status: AC | PRN
  Administered 2014-02-27: 100 ug via INTRAVENOUS

## 2014-02-27 MED ORDER — MIDAZOLAM HCL 2 MG/2ML IJ SOLN
INTRAMUSCULAR | Status: AC
  Filled 2014-02-27: qty 4

## 2014-02-27 MED ORDER — MIDAZOLAM HCL 2 MG/2ML IJ SOLN
INTRAMUSCULAR | Status: AC | PRN
  Administered 2014-02-27: 1 mg via INTRAVENOUS

## 2014-02-27 NOTE — Discharge Instructions (Signed)
Bone Marrow Aspiration °This is an examination in which a needle is inserted into a bone and a portion of the marrow in the bone is removed. This test confirms the diagnosis of anemias, leukemia, or myelomas. It helps determine the cause of decreased blood cells and deficient iron stores. It documents the process of metastasis if there has been a tumor which has spread to bone. It also helps study tumor staging, especially in lymphomas. °There are two methods for sampling bone marrow, an aspirate and a biopsy. The bone marrow, a soft fatty tissue found inside the body's larger bones, is often described as being like a honeycomb  it is a fibrous network filled with a liquid containing cells in various stages of maturation, and "raw materials" such as iron, vitamin B12, and folate that are required for cell production. The bone marrow aspirate provides a liquid sample of cells that can be studied individually, and the biopsy collects a sample that preserves the marrow's structure and shows the relationships of bone marrow cells to one another and the relative amount of marrow cells compared to fat (cellularity).  °Red blood cells (RBCs), platelets, and five different types of white blood cells (WBCs) are produced in the marrow as needed, with the number and type of cell being created at any one time based on the use of cells, loss, and a continual replacement of old cells. °The bone marrow biopsy and aspiration provide information about the status of and capability for blood cell production. They are not routinely ordered and in fact the majority of people will never have one done. A marrow aspiration or biopsy may be ordered to help evaluate blood cell production, to help diagnose leukemia, to help diagnose a bone marrow disorder, to help diagnose and stage a variety of other types of cancer (to determine spread into the marrow), and to help determine whether a severe anemia is due to decreased RBC production,  increased loss, abnormal RBC production, or to a vitamin or mineral deficiency or excess. Conditions that affect the marrow can affect the number, mixture, and maturity of the cells, and can affect its fibrous structure. °PREPARATION FOR TEST °· No special preparation is needed. The bone marrow aspiration or biopsy procedure is performed by a caregiver. Both types of samples may be collected from the hip bone (pelvis), and marrow aspirations may be collected from the breastbone (sternum). In children, samples may also be collected from a vertebrae in the back or from the thigh bone (femur). °· The most common collection site is the top ridge of the hip bone (iliac crest). Before the procedure, your blood pressure, heart rate, and temperature are measured and evaluated to make sure that they are within normal limits. You may be given a mild sedative. You will be asked to lie down on your stomach or side. Your lower body is draped with cloths so that only the area surrounding the site is exposed. °· The site is cleaned with an antiseptic and injected with a local anesthetic. When the site is numb, the caregiver inserts a needle through the skin and into the bone. For an aspiration, the caregiver attaches a syringe to the needle and pulls back on the plunger. This creates vacuum pressure and pulls a small amount of marrow into the syringe. °· For a bone marrow biopsy, the caregiver uses a special needle that allows the collection of a sample of bone and marrow. Even though your skin has been numbed, you may feel   brief but uncomfortable pressure sensations during these procedures. After the needle has been withdrawn, a sterile bandage is placed over the site and pressure is applied. You will then usually be instructed to lie quietly until your blood pressure, heart rate, and temperature are normal. You may be instructed to keep the site dry and covered for 48 hours.  Complications from the bone marrow aspiration or  biopsy procedure are rare, but some individuals may have excessive bleeding at the site or develop an infection.  Tell your caregiver about any allergies you have and about any medications or supplements you are taking prior to the procedure. NORMAL FINDINGS Cell type / Range (%)  Myeloblasts / less than 5  Promyelocytes / 1-8  Myelocytes  Neutrophilic / 8-11  Eosinophilic / 5.7-2  Basophilic / less than 1  Metamyelocytes  Neutrophilic / 62-03  Eosinophilic / less than 1  Basophilic / less than 1  Mature myelocytes  Neutrophilic / 55-97  Eosinophilic / less than 5  Basophilic / less than 5  Mononuclear  Monocytes / less than 5  Lymphocytes / 3-20  Plasma cells / less than 1  Megakaryocytes / less than 5  M/E ratio / less than 4  Normoblasts / 25-50 Normal iron content is demonstrated by staining with Prussian blue. Ranges for normal findings may vary among different laboratories and hospitals. You should always check with your caregiver after having lab work or other tests done to discuss the meaning of your test results and whether your values are considered within normal limits. MEANING OF TEST  Your caregiver will go over the test results with you and discuss the importance and meaning of your results, as well as treatment options and the need for additional tests if necessary. OBTAINING THE TEST RESULTS It is your responsibility to obtain your test results. Ask the lab or department performing the test when and how you will get your results. Document Released: 12/07/2004 Document Revised: 02/06/2012 Document Reviewed: 10/21/2008 [facility] Patient Information 2014 [place], [place].  Bone Marrow Aspiration, Bone Marrow Biopsy Care After Read the instructions outlined below and refer to this sheet in the next few weeks. These discharge instructions provide you with general information on caring for yourself after you leave the hospital. Your caregiver may also  give you specific instructions. While your treatment has been planned according to the most current medical practices available, unavoidable complications occasionally occur. If you have any problems or questions after discharge, call your caregiver. FINDING OUT THE RESULTS OF YOUR TEST Not all test results are available during your visit. If your test results are not back during the visit, make an appointment with your caregiver to find out the results. Do not assume everything is normal if you have not heard from your caregiver or the medical facility. It is important for you to follow up on all of your test results.  HOME CARE INSTRUCTIONS  You have had sedation and may be sleepy or dizzy. Your thinking may not be as clear as usual. For the next 24 hours:  Only take over-the-counter or prescription medicines for pain, discomfort, and or fever as directed by your caregiver.  Do not drink alcohol.  Do not smoke.  Do not drive.  Do not make important legal decisions.  Do not operate heavy machinery.  Do not care for small children by yourself.  Keep your dressing clean and dry. You may replace dressing with a bandage after 24 hours.  You may take a  bath or shower after 24 hours. °· Use an ice pack for 20 minutes every 2 hours while awake for pain as needed. °SEEK MEDICAL CARE IF:  °· There is redness, swelling, or increasing pain at the biopsy site. °· There is pus coming from the biopsy site. °· There is drainage from a biopsy site lasting longer than one day. °· An unexplained oral temperature above 102° F (38.9° C) develops. °SEEK IMMEDIATE MEDICAL CARE IF:  °· You develop a rash. °· You have difficulty breathing. °· You develop any reaction or side effects to medications given. °Document Released: 06/03/2005 Document Revised: 02/06/2012 Document Reviewed: 11/11/2008 °ExitCare® Patient Information ©2014 [employer]. ° °Conscious Sedation, Adult, Care After °Refer to this sheet in the next  few weeks. These instructions provide you with information on caring for yourself after your procedure. Your health care provider may also give you more specific instructions. Your treatment has been planned according to current medical practices, but problems sometimes occur. Call your health care provider if you have any problems or questions after your procedure. °WHAT TO EXPECT AFTER THE PROCEDURE  °After your procedure: °· You may feel sleepy, clumsy, and have poor balance for several hours. °· Vomiting may occur if you eat too soon after the procedure. °HOME CARE INSTRUCTIONS °· Do not participate in any activities where you could become injured for at least 24 hours. Do not: °· Drive. °· Swim. °· Ride a bicycle. °· Operate heavy machinery. °· Cook. °· Use power tools. °· Climb ladders. °· Work from a high place. °· Do not make important decisions or sign legal documents until you are improved. °· If you vomit, drink water, juice, or soup when you can drink without vomiting. Make sure you have little or no nausea before eating solid foods. °· Only take over-the-counter or prescription medicines for pain, discomfort, or fever as directed by your health care provider. °· Make sure you and your family fully understand everything about the medicines given to you, including what side effects may occur. °· You should not drink alcohol, take sleeping pills, or take medicines that cause drowsiness for at least 24 hours. °· If you smoke, do not smoke without supervision. °· If you are feeling better, you may resume normal activities 24 hours after you were sedated. °· Keep all appointments with your health care provider. °SEEK MEDICAL CARE IF: °· Your skin is pale or bluish in color. °· You continue to feel nauseous or vomit. °· Your pain is getting worse and is not helped by medicine. °· You have bleeding or swelling. °· You are still sleepy or feeling clumsy after 24 hours. °SEEK IMMEDIATE MEDICAL CARE IF: °· You  develop a rash. °· You have difficulty breathing. °· You develop any type of allergic problem. °· You have a fever. °MAKE SURE YOU: °· Understand these instructions. °· Will watch your condition. °· Will get help right away if you are not doing well or get worse. °Document Released: 09/04/2013 Document Reviewed: 06/21/2013 °ExitCare® Patient Information ©2014 [employer]. ° °

## 2014-02-27 NOTE — Progress Notes (Signed)
Pt here for prep for Bone Marrow Biopsy in CT. Very difficult IV access and able to obtain only CBC with IV start but not the PT. Rowe Robert PA Radiology notified of this.

## 2014-02-27 NOTE — H&P (Signed)
[name] is an 63 y.o. female.   Chief Complaint: "I'm having a bone marrow biopsy" HPI: Patient with history of IgG kappa MGUS presents today for CT guided bone marrow biopsy.  Past Medical History  Diagnosis Date  . Renal insufficiency   . Seizures   . Chronic headaches   . Hypertension   . Osteoporosis   . Depression   . MGUS (monoclonal gammopathy of unknown significance) 03/25/2013    Past Surgical History  Procedure Laterality Date  . Breast reduction surgery    . Abdominal hysterectomy    . Lung lobectomy      rt lung-cyst   . Appendectomy    . Cholecystectomy    . Back surgery  2002    Family History  Problem Relation Age of Onset  . Hypertension Mother   . Heart disease Mother   . Diabetes Mother   . Kidney disease Mother   . Arthritis Mother   . Hypertension Father    Social History:  reports that she quit smoking about 15 months ago. Her smoking use included Cigarettes. She has a 30 pack-year smoking history. She has never used smokeless tobacco. She reports that she does not drink alcohol or use illicit drugs.  Allergies:  Allergies  Allergen Reactions  . Asa [Aspirin]     High dose Aspirin causes itching but can take 78m   . Penicillins Hives  . Sulfonamide Derivatives Hives  . Tylenol With Codeine #3 [Acetaminophen-Codeine] Itching    Current outpatient prescriptions:aspirin EC 81 MG tablet, Take 81 mg by mouth daily., Disp: , Rfl: ;  atenolol (TENORMIN) 100 MG tablet, Take 50 mg by mouth at bedtime. , Disp: , Rfl: ;  baclofen (LIORESAL) 10 MG tablet, Take 10 mg by mouth 3 (three) times daily. , Disp: , Rfl: ;  cholecalciferol (VITAMIN D) 1000 UNITS tablet, Take 1,000 Units by mouth daily., Disp: , Rfl:  DULoxetine (CYMBALTA) 30 MG capsule, Take 30 mg by mouth every morning., Disp: , Rfl: ;  DULoxetine (CYMBALTA) 60 MG capsule, Take 60 mg by mouth at bedtime., Disp: , Rfl: ;  ferrous sulfate 325 (65 FE) MG tablet, Take 325 mg by mouth daily with  breakfast., Disp: , Rfl: ;  gabapentin (NEURONTIN) 300 MG capsule, Take 300 mg by mouth 3 (three) times daily. , Disp: , Rfl:  pantoprazole (PROTONIX) 40 MG tablet, Take 1 tablet (40 mg total) by mouth daily., Disp: 30 tablet, Rfl: 3;  simvastatin (ZOCOR) 10 MG tablet, Take 1 tablet (10 mg total) by mouth daily., Disp: 30 tablet, Rfl: 4;  zolpidem (AMBIEN) 10 MG tablet, Take 1 tablet (10 mg total) by mouth at bedtime as needed. For sleep, Disp: 30 tablet, Rfl: 1 LORazepam (ATIVAN) 1 MG tablet, Take 1 tablet (1 mg total) by mouth 2 (two) times daily as needed for anxiety., Disp: 60 tablet, Rfl: 0 Current facility-administered medications:0.9 %  sodium chloride infusion, , Intravenous, Continuous, KAscencion Dike PA-C, Last Rate: 20 mL/hr at 02/27/14 0823, 250 mL at 02/27/14 0823;  fentaNYL (SUBLIMAZE) 0.05 MG/ML injection, , , , ;  midazolam (VERSED) 2 MG/2ML injection, , , ,    02/27/14 labs pending Review of Systems  Constitutional: Negative for fever and chills.  Respiratory: Positive for cough. Negative for hemoptysis and shortness of breath.   Cardiovascular: Negative for chest pain.  Gastrointestinal: Negative for nausea, vomiting and abdominal pain.  Musculoskeletal: Positive for back pain.  Neurological: Positive for headaches.  Vitals: BP 148/73  HR  73  R 20  TEMP 97.9  O2 SATS 97% RA Physical Exam  Constitutional: She is oriented to person, place, and time. She appears well-developed and well-nourished.  Cardiovascular: Normal rate and regular rhythm.   Respiratory: Effort normal.  Sl dim BS rt base, left clear  GI: Soft. Bowel sounds are normal. There is no tenderness.  Musculoskeletal: Normal range of motion. She exhibits no edema.  Neurological: She is alert and oriented to person, place, and time.     Assessment/Plan Patient with history of IgG kappa MGUS presents today for CT guided bone marrow biopsy. Details/risks of procedure d/w pt/daughter with their understanding and  consent.   ALLRED,D KEVIN 02/27/2014, 8:24 AM

## 2014-02-27 NOTE — Procedures (Signed)
CT guided bone marrow biopsy.  No immediate complication.

## 2014-03-03 ENCOUNTER — Ambulatory Visit (HOSPITAL_COMMUNITY): Payer: Medicare Other | Attending: Nurse Practitioner

## 2014-03-06 LAB — CHROMOSOME ANALYSIS, BONE MARROW

## 2014-03-12 ENCOUNTER — Ambulatory Visit: Payer: Medicare Other | Admitting: Nurse Practitioner

## 2014-03-14 ENCOUNTER — Ambulatory Visit (INDEPENDENT_AMBULATORY_CARE_PROVIDER_SITE_OTHER): Payer: Medicare Other | Admitting: Nurse Practitioner

## 2014-03-14 ENCOUNTER — Other Ambulatory Visit: Payer: Self-pay | Admitting: *Deleted

## 2014-03-14 ENCOUNTER — Encounter: Payer: Self-pay | Admitting: Nurse Practitioner

## 2014-03-14 VITALS — BP 186/102 | HR 96 | Temp 98.7°F | Ht 67.0 in | Wt 223.0 lb

## 2014-03-14 DIAGNOSIS — I10 Essential (primary) hypertension: Secondary | ICD-10-CM

## 2014-03-14 MED ORDER — CLONIDINE HCL 0.1 MG PO TABS: 0.1000 mg | ORAL_TABLET | Freq: Two times a day (BID) | ORAL | Status: DC

## 2014-03-14 MED ORDER — LORAZEPAM 1 MG PO TABS: 1.0000 mg | ORAL_TABLET | Freq: Two times a day (BID) | ORAL | Status: DC | PRN

## 2014-03-14 NOTE — Telephone Encounter (Signed)
Patient last seen in office on 12-09-13. Rx last filled on 01-30-14. Please advise. If approved please route to pool B so nurse can phone in to [facility] in [place]. [phone]

## 2014-03-14 NOTE — Addendum Note (Signed)
Addended by: Chevis Pretty on: 03/14/2014 12:59 PM   Modules accepted: Orders

## 2014-03-14 NOTE — Patient Instructions (Signed)

## 2014-03-14 NOTE — Progress Notes (Signed)
   Subjective:    Patient ID: Quinn Plowman, female    DOB: [birth year], 63 y.o.   MRN: [id number]  HPI Patient in c/o fatigue and all over body aches x1 week- she sees a hematologist and recently had a bone marrow biopsy done because of abnormal blood work. Since then she has felt bad. SHe sees her hematologist on Monday.    Review of Systems  Constitutional: Negative.   HENT: Negative.   Respiratory: Negative.   Cardiovascular: Negative.   Genitourinary: Negative.   Neurological: Negative.   Psychiatric/Behavioral: Negative.   All other systems reviewed and are negative.      Objective:   Physical Exam  Constitutional: She appears well-developed and well-nourished.  Cardiovascular: Normal rate, regular rhythm and normal heart sounds.   Pulmonary/Chest: Effort normal and breath sounds normal.  Neurological: She is alert.  Skin: Skin is warm and dry.  Psychiatric: She has a normal mood and affect. Her behavior is normal. Judgment and thought content normal.    BP 186/102  Pulse 96  Temp(Src) 98.7 F (37.1 C) (Oral)  Ht $R'5\' 7"'cM$  (1.702 m)  Wt 223 lb (101.152 kg)  BMI 34.92 kg/m2       Assessment & Plan:   1. Hypertension    Meds ordered this encounter  Medications  . cloNIDine (CATAPRES) 0.1 MG tablet    Sig: Take 1 tablet (0.1 mg total) by mouth 2 (two) times daily.    Dispense:  60 tablet    Refill:  3    Order Specific Question:  Supervising Provider    Answer:  Chipper Herb [[id number]]   Added clonidine to meds Patient will let me know how blood pressure is when goes to see hematologist Monday Force fluids  Rest rto PRN [facility] hematologist do labs  Mary-Margaret Hamlin, [place]

## 2014-03-17 ENCOUNTER — Telehealth: Payer: Self-pay | Admitting: Nurse Practitioner

## 2014-03-17 NOTE — Telephone Encounter (Signed)
Ok good

## 2014-04-02 ENCOUNTER — Ambulatory Visit: Payer: Medicare Other | Admitting: Nurse Practitioner

## 2014-04-17 ENCOUNTER — Ambulatory Visit: Payer: Medicare Other | Admitting: Nurse Practitioner

## 2014-04-24 ENCOUNTER — Other Ambulatory Visit: Payer: Self-pay

## 2014-04-24 DIAGNOSIS — R803 Bence Jones proteinuria: Secondary | ICD-10-CM

## 2014-04-24 MED ORDER — ZOLPIDEM TARTRATE 10 MG PO TABS: 10.0000 mg | ORAL_TABLET | Freq: Every evening | ORAL | Status: DC | PRN

## 2014-04-24 NOTE — Telephone Encounter (Signed)
Called in.

## 2014-04-24 NOTE — Telephone Encounter (Signed)
Last seen 03/14/14  MMM If approved route to nurse to call into [facility]  [phone]

## 2014-04-24 NOTE — Telephone Encounter (Signed)
Please call in [place] with 2 refills

## 2014-04-25 ENCOUNTER — Encounter: Payer: Self-pay | Admitting: Family Medicine

## 2014-04-25 ENCOUNTER — Ambulatory Visit (INDEPENDENT_AMBULATORY_CARE_PROVIDER_SITE_OTHER): Payer: Medicare Other

## 2014-04-25 ENCOUNTER — Ambulatory Visit (INDEPENDENT_AMBULATORY_CARE_PROVIDER_SITE_OTHER): Payer: Medicare Other | Admitting: Family Medicine

## 2014-04-25 VITALS — BP 182/103 | HR 67 | Temp 97.7°F | Ht 67.0 in | Wt 223.0 lb

## 2014-04-25 DIAGNOSIS — R05 Cough: Secondary | ICD-10-CM

## 2014-04-25 DIAGNOSIS — R0602 Shortness of breath: Secondary | ICD-10-CM

## 2014-04-25 DIAGNOSIS — R059 Cough, unspecified: Secondary | ICD-10-CM

## 2014-04-25 DIAGNOSIS — I10 Essential (primary) hypertension: Secondary | ICD-10-CM

## 2014-04-25 DIAGNOSIS — J209 Acute bronchitis, unspecified: Secondary | ICD-10-CM

## 2014-04-25 LAB — POCT CBC
Granulocyte percent: 61.6 %G (ref 37–80)
HCT, POC: 39.1 % (ref 37.7–47.9)
Hemoglobin: 13.3 g/dL (ref 12.2–16.2)
Lymph, poc: 1.8 (ref 0.6–3.4)
MCH, POC: 32.6 pg — AB (ref 27–31.2)
MCHC: 34 g/dL (ref 31.8–35.4)
MCV: 95.8 fL (ref 80–97)
MPV: 8.4 fL (ref 0–99.8)
POC Granulocyte: 3.4 (ref 2–6.9)
POC LYMPH PERCENT: 32.9 %L (ref 10–50)
Platelet Count, POC: 322 10*3/uL (ref 142–424)
RBC: 4.1 M/uL (ref 4.04–5.48)
RDW, POC: 12.8 %
WBC: 5.6 10*3/uL (ref 4.6–10.2)

## 2014-04-25 MED ORDER — METHYLPREDNISOLONE ACETATE 80 MG/ML IJ SUSP: 80.0000 mg | Freq: Once | INTRAMUSCULAR | Status: DC

## 2014-04-25 MED ORDER — LEVOFLOXACIN 500 MG PO TABS: 500.0000 mg | ORAL_TABLET | Freq: Every day | ORAL | Status: DC

## 2014-04-25 MED ORDER — CLONIDINE HCL 0.3 MG/24HR TD PTWK: 0.3000 mg | MEDICATED_PATCH | TRANSDERMAL | Status: DC

## 2014-04-25 MED ORDER — BENZONATATE 100 MG PO CAPS: 100.0000 mg | ORAL_CAPSULE | Freq: Three times a day (TID) | ORAL | Status: DC | PRN

## 2014-04-25 NOTE — Progress Notes (Signed)
   Subjective:    Patient ID: [name], female    DOB: 20-Oct-1951, 63 y.o.   MRN: [id number]  HPI This 63 y.o. female presents for evaluation of hypertension, shortness of breath, uri sx's, and fatigue. She has hx of MGUS and she has hx of right thoracotomy.  She sees Heme/Onc for MGUS. She states she has DOE.   Review of Systems C/o uri sx's and SOB No chest pain,  HA, dizziness, vision change, N/V, diarrhea, constipation, dysuria, urinary urgency or frequency, myalgias, arthralgias or rash.     Objective:   Physical Exam Vital signs noted  Well developed well nourished female.  HEENT - Head atraumatic Normocephalic                Eyes - PERRLA, Conjuctiva - clear Sclera- Clear EOMI                Ears - EAC's Wnl TM's Wnl Gross Hearing WNL                Nose - Nares patent                 Throat - oropharanx wnl Respiratory - Lungs CTA bilateral Cardiac - RRR S1 and S2 without murmur GI - Abdomen soft Nontender and bowel sounds active x 4 Extremities - No edema. Neuro - Grossly intact.   CXR - Right lung field diminished due to s/p thoracotomy. Prelimnary reading by Iverson Alamin    Assessment & Plan:  Cough - Plan: POCT CBC, DG Chest 2 View, benzonatate (TESSALON PERLES) 100 MG capsule  SOB (shortness of breath) - Plan: POCT CBC, DG Chest 2 View, benzonatate (TESSALON PERLES) 100 MG capsule  Essential hypertension, benign - Plan: cloNIDine (CATAPRES - DOSED IN MG/24 HR) 0.3 mg/24hr patch  Acute bronchitis - Plan: levofloxacin (LEVAQUIN) 500 MG tablet, methylPREDNISolone acetate (DEPO-MEDROL) injection 80 mg  Follow up in one week.  Lysbeth Penner FNP

## 2014-04-28 ENCOUNTER — Telehealth: Payer: Self-pay | Admitting: Family Medicine

## 2014-04-28 NOTE — Telephone Encounter (Signed)
Appt given per patient request 

## 2014-05-01 ENCOUNTER — Telehealth: Payer: Self-pay | Admitting: Family Medicine

## 2014-05-02 ENCOUNTER — Ambulatory Visit: Payer: Medicare Other | Admitting: Family Medicine

## 2014-05-02 NOTE — Telephone Encounter (Signed)
Appt rescheduled. Patient aware.

## 2014-05-07 ENCOUNTER — Other Ambulatory Visit: Payer: Self-pay | Admitting: *Deleted

## 2014-05-07 DIAGNOSIS — R803 Bence Jones proteinuria: Secondary | ICD-10-CM

## 2014-05-07 MED ORDER — PANTOPRAZOLE SODIUM 40 MG PO TBEC: 40.0000 mg | DELAYED_RELEASE_TABLET | Freq: Every day | ORAL | Status: DC

## 2014-05-09 ENCOUNTER — Encounter: Payer: Self-pay | Admitting: Family Medicine

## 2014-05-09 ENCOUNTER — Ambulatory Visit (INDEPENDENT_AMBULATORY_CARE_PROVIDER_SITE_OTHER): Payer: Medicare Other | Admitting: Family Medicine

## 2014-05-09 VITALS — BP 144/79 | HR 75 | Temp 98.2°F | Ht 67.0 in | Wt 220.0 lb

## 2014-05-09 DIAGNOSIS — J449 Chronic obstructive pulmonary disease, unspecified: Secondary | ICD-10-CM

## 2014-05-09 DIAGNOSIS — R0602 Shortness of breath: Secondary | ICD-10-CM

## 2014-05-09 DIAGNOSIS — I10 Essential (primary) hypertension: Secondary | ICD-10-CM

## 2014-05-09 DIAGNOSIS — R51 Headache: Secondary | ICD-10-CM

## 2014-05-09 MED ORDER — HYDROCODONE-ACETAMINOPHEN 5-325 MG PO TABS: 1.0000 | ORAL_TABLET | Freq: Three times a day (TID) | ORAL | Status: DC | PRN

## 2014-05-09 MED ORDER — IPRATROPIUM-ALBUTEROL 20-100 MCG/ACT IN AERS: INHALATION_SPRAY | RESPIRATORY_TRACT | Status: DC

## 2014-05-09 NOTE — Progress Notes (Signed)
   Subjective:    Patient ID: [name], female    DOB: 10/19/1951, 63 y.o.   MRN: [id number]  HPI This 63 y.o. female presents for evaluation of shortness of breath.  She has hx of copd and right lung thoracotomy.  She c/o SOB at rest.  She was seen a few weeks ago and tx'd for bronchitis and she is feeling better but still SOB.  She states in the past she was heavy smoker.  She has used combivent MDI for copd she states.  She used to see pulmonary when she lived in [place].  She has hypertension and recently was rx'd clonidine TTS patch and her blood pressure is doing better. She Sees oncology for MGUS.    Review of Systems No chest pain, SOB, HA, dizziness, vision change, N/V, diarrhea, constipation, dysuria, urinary urgency or frequency, myalgias, arthralgias or rash.     Objective:   Physical Exam Vital signs noted  Well developed well nourished female.  HEENT - Head atraumatic Normocephalic                Eyes - PERRLA, Conjuctiva - clear Sclera- Clear EOMI                Ears - EAC's Wnl TM's Wnl Gross Hearing WNL                Throat - oropharanx wnl Respiratory - Lungs CTA bilateral diminshed right chest Cardiac - RRR S1 and S2 without murmur GI - Abdomen soft Nontender and bowel sounds active x 4        Assessment & Plan:  Headache(784.0) - Plan: HYDROcodone-acetaminophen (NORCO/VICODIN) 5-325 MG per tablet  Essential hypertension, benign - Plan: HYDROcodone-acetaminophen (NORCO/VICODIN) 5-325 MG per tablet  Shortness of breath - Plan: Ambulatory referral to Pulmonology, Ipratropium-Albuterol (COMBIVENT RESPIMAT) 20-100 MCG/ACT AERS respimat  COPD (chronic obstructive pulmonary disease) - Plan: Ambulatory referral to Pulmonology, Ipratropium-Albuterol (COMBIVENT RESPIMAT) 20-100 MCG/ACT [place] FNP

## 2014-05-19 ENCOUNTER — Other Ambulatory Visit: Payer: Self-pay | Admitting: *Deleted

## 2014-05-19 NOTE — Telephone Encounter (Signed)
Last seen 05/09/14, last filled 04/11/14. Route to pool B to be called into [facility] [phone]

## 2014-05-23 ENCOUNTER — Other Ambulatory Visit: Payer: Self-pay | Admitting: Family Medicine

## 2014-05-23 MED ORDER — LORAZEPAM 1 MG PO TABS: 1.0000 mg | ORAL_TABLET | Freq: Two times a day (BID) | ORAL | Status: DC | PRN

## 2014-06-03 ENCOUNTER — Other Ambulatory Visit: Payer: Self-pay | Admitting: *Deleted

## 2014-06-03 DIAGNOSIS — R803 Bence Jones proteinuria: Secondary | ICD-10-CM

## 2014-06-03 MED ORDER — ZOLPIDEM TARTRATE 10 MG PO TABS: 10.0000 mg | ORAL_TABLET | Freq: Every evening | ORAL | Status: DC | PRN

## 2014-06-03 NOTE — Telephone Encounter (Signed)
Last ov 05/09/14. Last refill 04/24/14. Call to [facility] if approved.

## 2014-06-16 ENCOUNTER — Other Ambulatory Visit: Payer: Self-pay | Admitting: Nurse Practitioner

## 2014-06-16 ENCOUNTER — Telehealth: Payer: Self-pay | Admitting: Family Medicine

## 2014-06-16 NOTE — Telephone Encounter (Signed)
Called in.

## 2014-06-19 ENCOUNTER — Institutional Professional Consult (permissible substitution): Payer: Medicare Other | Admitting: Pulmonary Disease

## 2014-07-15 ENCOUNTER — Ambulatory Visit (INDEPENDENT_AMBULATORY_CARE_PROVIDER_SITE_OTHER): Payer: Medicare Other | Admitting: Pulmonary Disease

## 2014-07-15 ENCOUNTER — Encounter: Payer: Self-pay | Admitting: Pulmonary Disease

## 2014-07-15 VITALS — BP 120/80 | HR 84 | Temp 98.5°F | Ht 67.0 in | Wt 223.4 lb

## 2014-07-15 DIAGNOSIS — J986 Disorders of diaphragm: Secondary | ICD-10-CM | POA: Insufficient documentation

## 2014-07-15 DIAGNOSIS — R0600 Dyspnea, unspecified: Secondary | ICD-10-CM

## 2014-07-15 DIAGNOSIS — R0989 Other specified symptoms and signs involving the circulatory and respiratory systems: Secondary | ICD-10-CM

## 2014-07-15 DIAGNOSIS — R0602 Shortness of breath: Secondary | ICD-10-CM

## 2014-07-15 DIAGNOSIS — R0609 Other forms of dyspnea: Secondary | ICD-10-CM

## 2014-07-15 NOTE — Assessment & Plan Note (Signed)
The patient has significant dyspnea on exertion that I suspect is multifactorial. She has a long history of tobacco use, and may have underlying COPD. I will schedule her for pulmonary function studies for further evaluation, and she may become a more aggressive bronchodilator regimen. 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 The patient tells me that she actually had her right upper lobe taken out, but questions whether this is accurate.  Finally, she clearly has significant debility, deconditioning, and is overweight. Will see her back after she has had PFTs, and also a sniff test to evaluate her right hemidiaphragm.

## 2014-07-15 NOTE — Patient Instructions (Signed)
Will schedule for breathing studies to evaluate your lung function and to see if you have copd. Will image your right diaphragm to see if it is functioning or if it is paralyzed. Will arrange followup with me on the same day as your breathing studies to review.

## 2014-07-15 NOTE — Progress Notes (Signed)
   Subjective:    Patient ID: [name], female    DOB: Mar 26, 1951, 63 y.o.   MRN: [id number]  HPI The patient is a 63 year old female who I've been asked to see for dyspnea on exertion. She tells me that she has had breathing issues for years, but feels that it is getting worse. She also feels that her mobility is greatly limited by her severe back issues which affects her right lower extremity. She has a long history of tobacco use for 30 years, but quit in 2013. She has not had pulmonary function studies, and currently is being treated with Combivent twice a day which she feels has helped. He she has had a recent chest x-ray that shows clear lung fields, but an elevated right hemidiaphragm with concomitant basilar atelectasis. This dates back at least a year. The patient describes a less than one block dyspnea on exertion at a moderate pace on flat ground, and will also get winded bringing one bag of groceries into the house. She also has difficulty walking up one flight of stairs because of shortness of breath and her leg issues. 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 She denies any other trauma to her right chest. She has a mild cough at times with rare nonpurulent mucus production. She also has intermittent lower extremity edema.   Review of Systems  Constitutional: Negative for fever and unexpected weight change.  HENT: Positive for sneezing and sore throat. Negative for congestion, dental problem, ear pain, nosebleeds, postnasal drip, rhinorrhea, sinus pressure and trouble swallowing.   Eyes: Negative for redness and itching.  Respiratory: Positive for cough and shortness of breath. Negative for chest tightness and wheezing.   Cardiovascular: Positive for leg swelling. Negative for palpitations.  Gastrointestinal: Positive for abdominal pain. Negative for nausea and vomiting.  Genitourinary: Negative for dysuria.    Musculoskeletal: Negative for joint swelling.  Skin: Negative for rash.  Neurological: Positive for headaches.  Hematological: Does not bruise/bleed easily.  Psychiatric/Behavioral: Positive for dysphoric mood. The patient is nervous/anxious.        Objective:   Physical Exam Constitutional:  Obese female, no acute distress  HENT:  Nares patent without discharge  Oropharynx without exudate, palate and uvula are elongated.  Eyes:  Perrla, eomi, no scleral icterus  Neck:  No JVD, no TMG  Cardiovascular:  Normal rate, regular rhythm, no rubs or gallops.  No murmurs        Intact distal pulses but diminished.  Pulmonary :  decreased breath sounds on the right, no stridor or respiratory distress   No rales, rhonchi, or wheezing  Abdominal:  Soft, nondistended, bowel sounds present.  No tenderness noted.   Musculoskeletal:  minimal lower extremity edema noted.  Lymph Nodes:  No cervical lymphadenopathy noted  Skin:  No cyanosis noted  Neurologic:  Alert, appropriate, moves all 4 extremities without obvious deficit.         Assessment & Plan:

## 2014-07-18 ENCOUNTER — Ambulatory Visit (HOSPITAL_COMMUNITY): Admission: RE | Admit: 2014-07-18 | Payer: Medicare Other | Source: Ambulatory Visit

## 2014-07-21 ENCOUNTER — Ambulatory Visit (HOSPITAL_COMMUNITY)
Admission: RE | Admit: 2014-07-21 | Discharge: 2014-07-21 | Disposition: A | Discharge status: Home / Self Care | Payer: Medicare Other | Source: Ambulatory Visit | Attending: Pulmonary Disease | Admitting: Pulmonary Disease

## 2014-07-21 ENCOUNTER — Other Ambulatory Visit: Payer: Self-pay | Admitting: Nurse Practitioner

## 2014-07-21 DIAGNOSIS — R0609 Other forms of dyspnea: Secondary | ICD-10-CM | POA: Insufficient documentation

## 2014-07-21 DIAGNOSIS — R0600 Dyspnea, unspecified: Secondary | ICD-10-CM

## 2014-07-21 DIAGNOSIS — R0989 Other specified symptoms and signs involving the circulatory and respiratory systems: Principal | ICD-10-CM | POA: Insufficient documentation

## 2014-07-21 DIAGNOSIS — J986 Disorders of diaphragm: Secondary | ICD-10-CM

## 2014-08-12 ENCOUNTER — Other Ambulatory Visit: Payer: Self-pay | Admitting: Family Medicine

## 2014-08-13 ENCOUNTER — Ambulatory Visit (INDEPENDENT_AMBULATORY_CARE_PROVIDER_SITE_OTHER): Payer: Medicare Other | Admitting: Family Medicine

## 2014-08-13 ENCOUNTER — Encounter: Payer: Self-pay | Admitting: Family Medicine

## 2014-08-13 VITALS — BP 145/82 | HR 72 | Temp 97.2°F | Ht 67.0 in | Wt 227.0 lb

## 2014-08-13 DIAGNOSIS — M1990 Unspecified osteoarthritis, unspecified site: Secondary | ICD-10-CM

## 2014-08-13 DIAGNOSIS — R51 Headache: Secondary | ICD-10-CM

## 2014-08-13 DIAGNOSIS — M129 Arthropathy, unspecified: Secondary | ICD-10-CM

## 2014-08-13 DIAGNOSIS — R809 Proteinuria, unspecified: Secondary | ICD-10-CM

## 2014-08-13 DIAGNOSIS — R803 Bence Jones proteinuria: Secondary | ICD-10-CM

## 2014-08-13 DIAGNOSIS — I10 Essential (primary) hypertension: Secondary | ICD-10-CM

## 2014-08-13 MED ORDER — ZOLPIDEM TARTRATE 10 MG PO TABS: 10.0000 mg | ORAL_TABLET | Freq: Every evening | ORAL | Status: DC | PRN

## 2014-08-13 MED ORDER — HYDROCODONE-ACETAMINOPHEN 5-325 MG PO TABS: 1.0000 | ORAL_TABLET | Freq: Three times a day (TID) | ORAL | Status: DC | PRN

## 2014-08-13 NOTE — Telephone Encounter (Signed)
Seen today, last lipid labs 11/2012

## 2014-08-13 NOTE — Progress Notes (Signed)
   Subjective:    Patient ID: [name], female    DOB: [birth year], 63 y.o.   MRN: [id number]  HPI This 63 y.o. female presents for evaluation of routine follow up.  She has hx of MGUS and sees hematology.  She sees pulmonary for copd and elevated right hemidiaphragm.  She has hx of  Arthritis and headaches and uses hydrocodone on occasion.  She has hx of insomnia and needs refills on ambien.   Review of Systems C/o headache No chest pain, SOB, HA, dizziness, vision change, N/V, diarrhea, constipation, dysuria, urinary urgency or frequency, myalgias, arthralgias or rash.     Objective:   Physical Exam Vital signs noted  Well developed well nourished female.  HEENT - Head atraumatic Normocephalic                Eyes - PERRLA, Conjuctiva - clear Sclera- Clear EOMI                Ears - EAC's Wnl TM's Wnl Gross Hearing WNL                Nose - Nares patent                 Throat - oropharanx wnl Respiratory - Lungs CTA bilateral Cardiac - RRR S1 and S2 without murmur GI - Abdomen soft Nontender and bowel sounds active x 4 Extremities - No edema. Neuro - Grossly intact.       Assessment & Plan:  Bence Jones protein - Plan: zolpidem (AMBIEN) 10 MG tablet, DISCONTINUED: zolpidem (AMBIEN) 10 MG tablet.  Follow up with hematology  Headache(784.0) - Plan: HYDROcodone-acetaminophen (NORCO/VICODIN) 5-325 MG per tablet  Essential hypertension, benign - Plan: HYDROcodone-acetaminophen (NORCO/VICODIN) 5-325 MG per tablet  Arthritis - Plan: HYDROcodone-acetaminophen (NORCO/VICODIN) 5-325 MG per tablet  Follow up in 3 months  Lysbeth Penner FNP

## 2014-08-14 ENCOUNTER — Encounter: Payer: Self-pay | Admitting: Pulmonary Disease

## 2014-08-14 ENCOUNTER — Ambulatory Visit (INDEPENDENT_AMBULATORY_CARE_PROVIDER_SITE_OTHER): Payer: Medicare Other | Admitting: Pulmonary Disease

## 2014-08-14 ENCOUNTER — Encounter (INDEPENDENT_AMBULATORY_CARE_PROVIDER_SITE_OTHER): Payer: Self-pay

## 2014-08-14 VITALS — BP 140/90 | HR 84 | Temp 97.9°F | Ht 66.0 in | Wt 228.0 lb

## 2014-08-14 DIAGNOSIS — R0609 Other forms of dyspnea: Secondary | ICD-10-CM

## 2014-08-14 DIAGNOSIS — R0989 Other specified symptoms and signs involving the circulatory and respiratory systems: Secondary | ICD-10-CM

## 2014-08-14 DIAGNOSIS — R0602 Shortness of breath: Secondary | ICD-10-CM

## 2014-08-14 DIAGNOSIS — R0600 Dyspnea, unspecified: Secondary | ICD-10-CM

## 2014-08-14 DIAGNOSIS — J986 Disorders of diaphragm: Secondary | ICD-10-CM

## 2014-08-14 NOTE — Assessment & Plan Note (Signed)
The patient's pulmonary function studies surprisingly shows very little airflow obstruction, and also severe restriction. Her diffusion capacity totally corrects with alveolar volume adjustment. 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 There is really no pulmonary intervention that is going to make her breathing significantly better, but I have encouraged her to work aggressively on weight loss and also her conditioning. I would be happy to see her back as needed.

## 2014-08-14 NOTE — Patient Instructions (Signed)
Your breathing studies do not show significant copd.  You can use your combivent inhaler as needed if you have shortness of breath that does not resolve with sitting down Work on weight loss as much as you can, as well as conditioning.   followup with me as needed.

## 2014-08-14 NOTE — Progress Notes (Signed)
   Subjective:    Patient ID: [name], female    DOB: 11-27-1951, 63 y.o.   MRN: [id number]  HPI The patient comes in today for followup after her PFTs, as part of a workup for dyspnea on exertion. 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 She does have severe restriction as expected with her prior lobectomy, paralyzed diaphragm, and centripetal obesity. Her diffusion capacity was severely reduced, but corrected back to normal without a lower volume adjustment. I have reviewed her study with her in detail, and answered all of her questions.   Review of Systems  Constitutional: Negative for fever and unexpected weight change.  HENT: Positive for congestion, postnasal drip, rhinorrhea and sinus pressure. Negative for dental problem, ear pain, nosebleeds, sneezing, sore throat and trouble swallowing.   Eyes: Negative for redness and itching.  Respiratory: Positive for cough, chest tightness, shortness of breath and wheezing.   Cardiovascular: Negative for palpitations and leg swelling.  Gastrointestinal: Negative for nausea and vomiting.  Genitourinary: Negative for dysuria.  Musculoskeletal: Negative for joint swelling.  Skin: Negative for rash.  Neurological: Positive for headaches.  Hematological: Does not bruise/bleed easily.  Psychiatric/Behavioral: Negative for dysphoric mood. The patient is not nervous/anxious.        Objective:   Physical Exam Morbidly obese female in no acute distress Nose without purulence or discharge noted Neck without lymphadenopathy or thyromegaly Lower extremities with edema, no cyanosis Alert and oriented, moves all 4 extremities.       Assessment & Plan:

## 2014-08-14 NOTE — Assessment & Plan Note (Signed)
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 Unfortunately there is not a lot to do for this at this time.

## 2014-08-14 NOTE — Progress Notes (Signed)
PFT done today. 

## 2014-08-20 ENCOUNTER — Other Ambulatory Visit: Payer: Self-pay | Admitting: Family Medicine

## 2014-08-21 ENCOUNTER — Ambulatory Visit (HOSPITAL_COMMUNITY): Payer: Medicare Other | Admitting: Oncology

## 2014-08-21 ENCOUNTER — Ambulatory Visit (HOSPITAL_COMMUNITY): Payer: Medicare Other

## 2014-09-01 ENCOUNTER — Encounter (HOSPITAL_COMMUNITY): Payer: Self-pay

## 2014-09-04 LAB — PULMONARY FUNCTION TEST
DL/VA % pred: 114 %
DL/VA: 5.75 ml/min/mmHg/L
DLCO unc % pred: 47 %
DLCO unc: 12.88 ml/min/mmHg
FEF 25-75 Post: 1.61 L/sec
FEF 25-75 Pre: 1.37 L/sec
FEF2575-%Change-Post: 17 %
FEF2575-%Pred-Post: 76 %
FEF2575-%Pred-Pre: 65 %
FEV1-%Change-Post: 2 %
FEV1-%Pred-Post: 53 %
FEV1-%Pred-Pre: 52 %
FEV1-Post: 1.18 L
FEV1-Pre: 1.15 L
FEV1FVC-%Change-Post: 4 %
FEV1FVC-%Pred-Pre: 106 %
FEV6-%Change-Post: 0 %
FEV6-%Pred-Post: 49 %
FEV6-%Pred-Pre: 49 %
FEV6-Post: 1.34 L
FEV6-Pre: 1.35 L
FEV6FVC-%Change-Post: 0 %
FEV6FVC-%Pred-Post: 104 %
FEV6FVC-%Pred-Pre: 103 %
FVC-%Change-Post: -1 %
FVC-%Pred-Post: 47 %
FVC-%Pred-Pre: 48 %
FVC-Post: 1.34 L
Post FEV1/FVC ratio: 88 %
Post FEV6/FVC ratio: 100 %
Pre FEV1/FVC ratio: 84 %
Pre FEV6/FVC Ratio: 100 %
RV % pred: 54 %
RV: 1.17 L
TLC % pred: 49 %
TLC: 2.64 L

## 2014-09-07 NOTE — Progress Notes (Signed)
-  No show-  [name],[name]  

## 2014-09-11 ENCOUNTER — Ambulatory Visit (HOSPITAL_COMMUNITY): Payer: Medicare Other | Admitting: Oncology

## 2014-09-15 ENCOUNTER — Telehealth: Payer: Self-pay | Admitting: Family Medicine

## 2014-09-15 NOTE — Telephone Encounter (Signed)
Pt wanted appt today but no appts available. Pt went to urgent care last week for cough. Pt c/o of SOB and weak. Advised to go to ER now. Pt verbalized understanding.

## 2014-09-19 ENCOUNTER — Other Ambulatory Visit: Payer: Self-pay | Admitting: Nurse Practitioner

## 2014-09-23 ENCOUNTER — Other Ambulatory Visit: Payer: Self-pay | Admitting: Family Medicine

## 2014-09-24 NOTE — Telephone Encounter (Signed)
Last ov 08/13/14. Last refill 08/25/14. Route to nurse pool. If approved call to [facility] pharmacy [phone].

## 2014-09-26 ENCOUNTER — Encounter (HOSPITAL_COMMUNITY): Payer: Medicare Other | Attending: Hematology and Oncology

## 2014-09-26 ENCOUNTER — Encounter (HOSPITAL_COMMUNITY): Payer: Self-pay

## 2014-09-26 VITALS — BP 150/76 | HR 91 | Temp 98.2°F | Resp 16 | Wt 229.1 lb

## 2014-09-26 DIAGNOSIS — Z23 Encounter for immunization: Secondary | ICD-10-CM

## 2014-09-26 DIAGNOSIS — G40909 Epilepsy, unspecified, not intractable, without status epilepticus: Secondary | ICD-10-CM

## 2014-09-26 DIAGNOSIS — R51 Headache: Secondary | ICD-10-CM

## 2014-09-26 DIAGNOSIS — G43909 Migraine, unspecified, not intractable, without status migrainosus: Secondary | ICD-10-CM

## 2014-09-26 DIAGNOSIS — J986 Disorders of diaphragm: Secondary | ICD-10-CM

## 2014-09-26 DIAGNOSIS — D472 Monoclonal gammopathy: Secondary | ICD-10-CM

## 2014-09-26 MED ORDER — INFLUENZA VAC SPLIT QUAD 0.5 ML IM SUSY
0.5000 mL | PREFILLED_SYRINGE | Freq: Once | INTRAMUSCULAR | Status: AC
  Administered 2014-09-26: 0.5 mL via INTRAMUSCULAR
  Filled 2014-09-26: qty 0.5

## 2014-09-26 NOTE — Progress Notes (Signed)
[facility]  OFFICE PROGRESS NOTE  [name], [place] [address]  DIAGNOSIS: MGUS (monoclonal gammopathy of unknown significance)  Migraine syndrome  Seizure disorder  Paralysis of diaphragm  Chief Complaint  Patient presents with  . MGUS    CURRENT THERAPY: Observation and surveillance  INTERVAL HISTORY: [name] 63 y.o. 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 She suffers with chronic right hip pain after having undergone back surgery in 2002. She is fatigued and short of breath on mild exertion. She does get night sweats but with regular bowel movements and no vaginal bleeding or discharge. She denies a sore throat, cough, ENT, orthopnea, palpitations, and does get Botox injections every 3 months for migraine syndrome.    MEDICAL HISTORY: Past Medical History  Diagnosis Date  . Renal insufficiency   . Seizures   . Chronic headaches   . Hypertension   . Osteoporosis   . Depression   . 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 URI (upper respiratory infection) 08/2014    early October    INTERIM HISTORY: has HYPERTENSION, UNSPECIFIED; DOE (dyspnea on exertion); CHEST PAIN-UNSPECIFIED; Insomnia; Depression; Seizures; Hyperlipidemia; MGUS (monoclonal gammopathy of unknown significance); and Diaphragm paralysis on her problem list.    ALLERGIES:  is allergic to asa; penicillins; sulfonamide derivatives; and tylenol with codeine #3.  MEDICATIONS: has a current medication list which includes the following prescription(s): aspirin ec, baclofen, cholecalciferol, clonidine, clonidine, duloxetine, duloxetine, duloxetine, ferrous sulfate, gabapentin, hydrocodone-acetaminophen, ipratropium-albuterol, lidocaine, lorazepam, pantoprazole, simvastatin, and  zolpidem.  SURGICAL HISTORY:  Past Surgical History  Procedure Laterality Date  . Breast reduction surgery    . Abdominal hysterectomy    . Lung lobectomy      rt lung-cyst   . Appendectomy    . Cholecystectomy    . Back surgery  2002    FAMILY HISTORY: family history includes Arthritis in her mother; Diabetes in her mother; Heart disease in her mother; Hypertension in her father and mother; Kidney disease in her mother.  SOCIAL HISTORY:  reports that she quit smoking about 22 months ago. Her smoking use included Cigarettes. She has a 30 pack-year smoking history. She has never used smokeless tobacco. She reports that she does not drink alcohol or use illicit drugs.  REVIEW OF SYSTEMS:  Other than that discussed above is noncontributory.  PHYSICAL EXAMINATION: ECOG PERFORMANCE STATUS: 1 - Symptomatic but completely ambulatory  Blood pressure 150/76, pulse 91, temperature 98.2 F (36.8 C), temperature source Oral, resp. rate 16, weight 229 lb 1.6 oz (103.919 kg), SpO2 99.00%.  GENERAL:alert, no distress and comfortable. Morbidly obese. SKIN: skin color, texture, turgor are normal, no rashes or significant lesions EYES: PERLA; Conjunctiva are pink and non-injected, sclera clear SINUSES: No redness or tenderness over maxillary or ethmoid sinuses OROPHARYNX:no exudate, no erythema on lips, buccal mucosa, or tongue. NECK: supple, thyroid normal size, non-tender, without nodularity. No masses CHEST: Decreased diaphragmatic excursion on the right. No breast masses. LYMPH:  no palpable lymphadenopathy in the cervical, axillary or inguinal LUNGS: clear to auscultation and percussion with normal breathing effort. No breath sounds on the right. HEART: regular rate & rhythm and no murmurs. ABDOMEN:abdomen soft, non-tender and normal bowel sounds MUSCULOSKELETAL:no cyanosis of digits and no clubbing. Range of motion normal.  NEURO: alert & oriented x 3  with fluent speech, no focal  motor/sensory deficits   LABORATORY DATA: No visits with results within 30 Day(s) from this visit. Latest known visit with results is:  Clinical Support on 08/14/2014  Component Date Value Ref Range Status  . FVC-%Pred-Pre 08/14/2014 48   Final  . FVC-Post 08/14/2014 1.34   Final  . FVC-%Pred-Post 08/14/2014 47   Final  . FVC-%Change-Post 08/14/2014 -1   Final  . FEV1-Pre 08/14/2014 1.15   Final  . FEV1-%Pred-Pre 08/14/2014 52   Final  . FEV1-Post 08/14/2014 1.18   Final  . FEV1-%Pred-Post 08/14/2014 53   Final  . FEV1-%Change-Post 08/14/2014 2   Final  . FEV6-Pre 08/14/2014 1.35   Final  . FEV6-%Pred-Pre 08/14/2014 49   Final  . FEV6-Post 08/14/2014 1.34   Final  . FEV6-%Pred-Post 08/14/2014 49   Final  . FEV6-%Change-Post 08/14/2014 0   Final  . Pre FEV1/FVC ratio 08/14/2014 84   Final  . FEV1FVC-%Pred-Pre 08/14/2014 106   Final  . Post FEV1/FVC ratio 08/14/2014 88   Final  . FEV1FVC-%Change-Post 08/14/2014 4   Final  . Pre FEV6/FVC Ratio 08/14/2014 100   Final  . FEV6FVC-%Pred-Pre 08/14/2014 103   Final  . Post FEV6/FVC ratio 08/14/2014 100   Final  . FEV6FVC-%Pred-Post 08/14/2014 104   Final  . FEV6FVC-%Change-Post 08/14/2014 0   Final  . FEF 25-75 Pre 08/14/2014 1.37   Final  . FEF2575-%Pred-Pre 08/14/2014 65   Final  . FEF 25-75 Post 08/14/2014 1.61   Final  . FEF2575-%Pred-Post 08/14/2014 76   Final  . FEF2575-%Change-Post 08/14/2014 17   Final  . RV 08/14/2014 1.17   Final  . RV % pred 08/14/2014 54   Final  . TLC 08/14/2014 2.64   Final  . TLC % pred 08/14/2014 49   Final  . DLCO unc 08/14/2014 12.88   Final  . DLCO unc % pred 08/14/2014 47   Final  . DL/VA 08/14/2014 5.75   Final  . DL/VA % pred 08/14/2014 114   Final    PATHOLOGY: No new pathology.  Urinalysis No results found for this basename: colorurine,  appearanceur,  labspec,  phurine,  glucoseu,  hgbur,  bilirubinur,  ketonesur,  proteinur,  urobilinogen,  nitrite,  leukocytesur    RADIOGRAPHIC  STUDIES: No results found.  ASSESSMENT:  1. IgG MGUS stable at this time without any evidence to suggest progression to multiple myeloma or smoldering myeloma. Patient is in the low intermediate risk category with absolute risk of Progression over 20 years of about 21% (Rajkumar SV, et al ; Blood. 2005 Aug 1;106(3):812-7. Epub 2005 Apr 26) due to one risk factor of abnormal light chain ratio.  2. Headaches, chronic. Followed by Neurology in [place] 3. Paralyzed right side of diaphragm   PLAN:  #1. Influenza virus vaccine was given today. #2. Follow-up in 6 months with CBC, chem profile, LDH, beta-2 microglobulin, myeloma panel, light chains. Patient will be telephoned if any abnormalities are noted on today's lab work that would require further intervention. She was again reassured that 80% of the time, MGUS remains as such and people die with it, not of it.   All questions were answered. The patient knows to call the clinic with any problems, questions or concerns. We can certainly see the patient much sooner if necessary.   I spent 25 minutes counseling the patient face to face. The total time spent in the appointment was 30 minutes.    Doroteo Bradford, MD  09/27/2014 1:01 PM  DISCLAIMER:  This note was dictated with voice recognition software.  Similar sounding words can inadvertently be transcribed inaccurately and may not be corrected upon review.

## 2014-09-26 NOTE — Progress Notes (Signed)
[name] presents today for injection per MD orders. Flu Vaccine administered IM in left deltoid. Administration without incident. Patient tolerated well.   [name] presented for [employer]. Unable to obtain blood and 2 venipunctures by 1 RN and 2 venipunctures by a different Rn Patient will call and schedule lab visit next week.  Encouraged to drink extra fluid when she comes for blood work

## 2014-09-26 NOTE — Patient Instructions (Signed)
[facility] Discharge Instructions  RECOMMENDATIONS MADE BY THE CONSULTANT AND ANY TEST RESULTS WILL BE SENT TO YOUR REFERRING PHYSICIAN.  EXAM FINDINGS BY THE PHYSICIAN TODAY AND SIGNS OR SYMPTOMS TO REPORT TO CLINIC OR PRIMARY PHYSICIAN: Exam and findings as discussed by Dr. Barnet Glasgow.  Will give you a flu vaccine today.  Your fatigue is due to your decreased lung function.  Will check labs and will contact you with any concerns.    INSTRUCTIONS/FOLLOW-UP: Follow-up in 6 months with labs and office visit.  Thank you for choosing [facility] to provide your oncology and hematology care.  To afford each patient quality time with our providers, please arrive at least 15 minutes before your scheduled appointment time.  With your help, our goal is to use those 15 minutes to complete the necessary work-up to ensure our physicians have the information they need to help with your evaluation and healthcare recommendations.    Effective January 1st, 2014, we ask that you re-schedule your appointment with our physicians should you arrive 10 or more minutes late for your appointment.  We strive to give you quality time with our providers, and arriving late affects you and other patients whose appointments are after yours.    Again, thank you for choosing [facility].  Our hope is that these requests will decrease the amount of time that you wait before being seen by our physicians.       _____________________________________________________________  Should you have questions after your visit to [facility], please contact our office at [phone] between the hours of 8:30 a.m. and 4:30 p.m.  Voicemails left after 4:30 p.m. will not be returned until the following business day.  For prescription refill requests, have your pharmacy contact our office with your prescription refill request.     _______________________________________________________________  We hope that we have given you very good care.  You may receive a patient satisfaction survey in the mail, please complete it and return it as soon as possible.  We value your feedback!  _______________________________________________________________  Have you asked about our STAR program?  STAR stands for Survivorship Training and Rehabilitation, and this is a nationally recognized cancer care program that focuses on survivorship and rehabilitation.  Cancer and cancer treatments may cause problems, such as, pain, making you feel tired and keeping you from doing the things that you need or want to do. Cancer rehabilitation can help. Our goal is to reduce these troubling effects and help you have the best quality of life possible.  You may receive a survey from a nurse that asks questions about your current state of health.  Based on the survey results, all eligible patients will be referred to the [facility] program for an evaluation so we can better serve you!  A frequently asked questions sheet is available upon request.

## 2014-10-02 ENCOUNTER — Encounter (HOSPITAL_COMMUNITY): Payer: Medicare Other | Attending: Hematology and Oncology

## 2014-10-02 DIAGNOSIS — D472 Monoclonal gammopathy: Secondary | ICD-10-CM | POA: Insufficient documentation

## 2014-10-02 LAB — CBC WITH DIFFERENTIAL/PLATELET
Basophils Absolute: 0 10*3/uL (ref 0.0–0.1)
Basophils Relative: 0 % (ref 0–1)
Eosinophils Absolute: 0.1 10*3/uL (ref 0.0–0.7)
Eosinophils Relative: 1 % (ref 0–5)
HCT: 42.5 % (ref 36.0–46.0)
Hemoglobin: 13.9 g/dL (ref 12.0–15.0)
Lymphocytes Relative: 22 % (ref 12–46)
Lymphs Abs: 2.7 10*3/uL (ref 0.7–4.0)
MCH: 31.6 pg (ref 26.0–34.0)
MCHC: 32.7 g/dL (ref 30.0–36.0)
MCV: 96.6 fL (ref 78.0–100.0)
Monocytes Absolute: 0.7 10*3/uL (ref 0.1–1.0)
Monocytes Relative: 5 % (ref 3–12)
Neutro Abs: 8.8 10*3/uL — ABNORMAL HIGH (ref 1.7–7.7)
Neutrophils Relative %: 72 % (ref 43–77)
Platelets: 376 10*3/uL (ref 150–400)
RBC: 4.4 MIL/uL (ref 3.87–5.11)
RDW: 12.8 % (ref 11.5–15.5)
WBC: 12.2 10*3/uL — ABNORMAL HIGH (ref 4.0–10.5)

## 2014-10-02 LAB — COMPREHENSIVE METABOLIC PANEL
ALT: 16 U/L (ref 0–35)
AST: 16 U/L (ref 0–37)
Albumin: 3.6 g/dL (ref 3.5–5.2)
Alkaline Phosphatase: 116 U/L (ref 39–117)
Anion gap: 12 (ref 5–15)
BUN: 25 mg/dL — ABNORMAL HIGH (ref 6–23)
CO2: 26 mEq/L (ref 19–32)
Calcium: 9.6 mg/dL (ref 8.4–10.5)
Chloride: 99 mEq/L (ref 96–112)
Creatinine, Ser: 1.49 mg/dL — ABNORMAL HIGH (ref 0.50–1.10)
GFR calc Af Amer: 42 mL/min — ABNORMAL LOW (ref >90)
GFR calc non Af Amer: 36 mL/min — ABNORMAL LOW (ref >90)
Glucose, Bld: 95 mg/dL (ref 70–99)
Potassium: 4.1 mEq/L (ref 3.7–5.3)
Sodium: 137 mEq/L (ref 137–147)
Total Bilirubin: 0.5 mg/dL (ref 0.3–1.2)
Total Protein: 7.6 g/dL (ref 6.0–8.3)

## 2014-10-02 LAB — LACTATE DEHYDROGENASE: LDH: 197 U/L (ref 94–250)

## 2014-10-02 LAB — C-REACTIVE PROTEIN: CRP: 0.9 mg/dL — ABNORMAL HIGH (ref <0.60)

## 2014-10-02 NOTE — Progress Notes (Signed)
LABS FOR CRP,LDH,KLLC,MM,B2M,CBCD,CMP

## 2014-10-03 LAB — KAPPA/LAMBDA LIGHT CHAINS
Kappa free light chain: 3.58 mg/dL — ABNORMAL HIGH (ref 0.33–1.94)
Kappa, lambda light chain ratio: 2.52 — ABNORMAL HIGH (ref 0.26–1.65)
Lambda free light chains: 1.42 mg/dL (ref 0.57–2.63)

## 2014-10-06 LAB — MULTIPLE MYELOMA PANEL, SERUM
Albumin ELP: 50.5 % — ABNORMAL LOW (ref 55.8–66.1)
Alpha-1-Globulin: 4.9 % (ref 2.9–4.9)
Alpha-2-Globulin: 11.8 % (ref 7.1–11.8)
Beta 2: 7.2 % — ABNORMAL HIGH (ref 3.2–6.5)
Beta Globulin: 6.6 % (ref 4.7–7.2)
Gamma Globulin: 19 % — ABNORMAL HIGH (ref 11.1–18.8)
IgA: 282 mg/dL (ref 69–380)
IgG (Immunoglobin G), Serum: 1390 mg/dL (ref 690–1700)
IgM, Serum: 205 mg/dL (ref 52–322)
M-Spike, %: 0.43 g/dL
Total Protein: 6.9 g/dL (ref 6.0–8.3)

## 2014-10-06 LAB — BETA 2 MICROGLOBULIN, SERUM: Beta-2 Microglobulin: 4.24 mg/L — ABNORMAL HIGH (ref <=2.51)

## 2014-11-04 ENCOUNTER — Other Ambulatory Visit: Payer: Self-pay | Admitting: Family Medicine

## 2014-11-05 NOTE — Telephone Encounter (Signed)
Last ov 9/15. Last refill 09/25/14. If approved call to [facility] [phone]

## 2014-11-10 ENCOUNTER — Other Ambulatory Visit: Payer: Self-pay | Admitting: Family Medicine

## 2014-11-10 NOTE — Telephone Encounter (Signed)
RX called in to [facility] Pt notified

## 2014-11-14 ENCOUNTER — Ambulatory Visit (INDEPENDENT_AMBULATORY_CARE_PROVIDER_SITE_OTHER): Payer: Medicare Other

## 2014-11-14 ENCOUNTER — Ambulatory Visit (INDEPENDENT_AMBULATORY_CARE_PROVIDER_SITE_OTHER): Payer: Medicare Other | Admitting: Family Medicine

## 2014-11-14 ENCOUNTER — Encounter: Payer: Self-pay | Admitting: Family Medicine

## 2014-11-14 VITALS — BP 186/101 | HR 99 | Temp 98.2°F | Wt 230.0 lb

## 2014-11-14 DIAGNOSIS — M5441 Lumbago with sciatica, right side: Secondary | ICD-10-CM

## 2014-11-14 MED ORDER — OXYCODONE-ACETAMINOPHEN 5-325 MG PO TABS: 1.0000 | ORAL_TABLET | Freq: Four times a day (QID) | ORAL | Status: DC | PRN

## 2014-11-14 MED ORDER — PREDNISONE 10 MG PO TABS: ORAL_TABLET | ORAL | Status: DC

## 2014-11-14 NOTE — Progress Notes (Signed)
   Subjective:    Patient ID: [name], female    DOB: 1951-07-01, 63 y.o.   MRN: [id number]  HPI Patient is having back pain on her right side radiating down her right leg.  She has hx of DDD of the LS spine and has had surgery in past.  Review of Systems  Constitutional: Negative for fever.  HENT: Negative for ear pain.   Eyes: Negative for discharge.  Respiratory: Negative for cough.   Cardiovascular: Negative for chest pain.  Gastrointestinal: Negative for abdominal distention.  Endocrine: Negative for polyuria.  Genitourinary: Negative for difficulty urinating.  Musculoskeletal: Positive for back pain and gait problem. Negative for neck pain.  Skin: Negative for color change and rash.  Neurological: Negative for speech difficulty and headaches.  Psychiatric/Behavioral: Negative for agitation.       Objective:    BP 186/101 mmHg  Pulse 99  Temp(Src) 98.2 F (36.8 C) (Oral)  Wt 230 lb (104.327 kg) Physical Exam  Constitutional: She is oriented to person, place, and time. She appears well-developed and well-nourished.  HENT:  Head: Normocephalic and atraumatic.  Mouth/Throat: Oropharynx is clear and moist.  Eyes: Pupils are equal, round, and reactive to light.  Neck: Normal range of motion. Neck supple.  Cardiovascular: Normal rate and regular rhythm.   No murmur heard. Pulmonary/Chest: Effort normal and breath sounds normal.  Abdominal: Soft. Bowel sounds are normal. There is no tenderness.  Musculoskeletal:  TTP right LS muscles  Neurological: She is alert and oriented to person, place, and time.  Skin: Skin is warm and dry.  Psychiatric: She has a normal mood and affect.          Assessment & Plan:     ICD-9-CM ICD-10-CM   1. Right-sided low back pain with right-sided sciatica 724.3 M54.41 oxyCODONE-acetaminophen (PERCOCET/ROXICET) 5-325 MG per tablet     DG Lumbar Spine 2-3 Views     predniSONE (DELTASONE) 10 MG tablet     No Follow-up on  file.  Lysbeth Penner FNP

## 2014-11-27 ENCOUNTER — Other Ambulatory Visit: Payer: Self-pay | Admitting: Family Medicine

## 2014-12-04 ENCOUNTER — Other Ambulatory Visit: Payer: Self-pay | Admitting: Family Medicine

## 2014-12-04 NOTE — Telephone Encounter (Signed)
Last seen 11/14/14, last filled 11/03/14. Call into [facility] [phone]

## 2014-12-05 NOTE — Telephone Encounter (Signed)
[facility]

## 2014-12-11 ENCOUNTER — Other Ambulatory Visit: Payer: Self-pay | Admitting: Family Medicine

## 2014-12-11 NOTE — Telephone Encounter (Signed)
Last seen 11/14/14 [facility] [place]  If approved route to nurse to call into [facility]  [phone]

## 2014-12-12 NOTE — Telephone Encounter (Signed)
Refilled called to pharmacy 

## 2015-01-05 ENCOUNTER — Other Ambulatory Visit (HOSPITAL_COMMUNITY): Payer: Self-pay

## 2015-01-08 ENCOUNTER — Other Ambulatory Visit (HOSPITAL_COMMUNITY): Payer: Medicaid Other

## 2015-01-08 ENCOUNTER — Ambulatory Visit (HOSPITAL_COMMUNITY): Payer: Medicaid Other | Admitting: Oncology

## 2015-01-13 ENCOUNTER — Other Ambulatory Visit: Payer: Self-pay | Admitting: Family Medicine

## 2015-01-13 DIAGNOSIS — G43709 Chronic migraine without aura, not intractable, without status migrainosus: Secondary | ICD-10-CM | POA: Diagnosis not present

## 2015-01-13 NOTE — Telephone Encounter (Signed)
Please call in ativan with 1 refills 

## 2015-01-13 NOTE — Telephone Encounter (Signed)
Last seen 11/14/14 [facility] [place]  If approved route to nurse to call into [place]  [phone]

## 2015-01-14 NOTE — Telephone Encounter (Signed)
[facility]

## 2015-01-22 ENCOUNTER — Ambulatory Visit (HOSPITAL_COMMUNITY): Payer: Medicaid Other | Admitting: Oncology

## 2015-01-22 ENCOUNTER — Other Ambulatory Visit (HOSPITAL_COMMUNITY): Payer: Medicaid Other

## 2015-01-28 ENCOUNTER — Other Ambulatory Visit: Payer: Self-pay | Admitting: Family Medicine

## 2015-01-28 ENCOUNTER — Telehealth: Payer: Self-pay | Admitting: Family Medicine

## 2015-01-28 NOTE — Telephone Encounter (Signed)
Pt given appt with MMM 3/10 at 9:45.

## 2015-01-29 NOTE — Telephone Encounter (Signed)
Last seen 11/14/14 [facility] [place]  If approved route to nurse to call into [facility] [phone]

## 2015-01-29 NOTE — Telephone Encounter (Signed)
Please call in ambien with 1 refills 

## 2015-01-29 NOTE — Telephone Encounter (Signed)
Called to pharmacy 

## 2015-02-05 ENCOUNTER — Ambulatory Visit (INDEPENDENT_AMBULATORY_CARE_PROVIDER_SITE_OTHER): Payer: Medicare Other

## 2015-02-05 ENCOUNTER — Encounter: Payer: Self-pay | Admitting: Nurse Practitioner

## 2015-02-05 ENCOUNTER — Ambulatory Visit (INDEPENDENT_AMBULATORY_CARE_PROVIDER_SITE_OTHER): Payer: Medicare Other | Admitting: Nurse Practitioner

## 2015-02-05 VITALS — BP 194/111 | HR 105 | Temp 96.6°F | Ht 66.0 in | Wt 234.0 lb

## 2015-02-05 DIAGNOSIS — E785 Hyperlipidemia, unspecified: Secondary | ICD-10-CM

## 2015-02-05 DIAGNOSIS — M25561 Pain in right knee: Secondary | ICD-10-CM | POA: Diagnosis not present

## 2015-02-05 DIAGNOSIS — I10 Essential (primary) hypertension: Secondary | ICD-10-CM | POA: Diagnosis not present

## 2015-02-05 DIAGNOSIS — Z23 Encounter for immunization: Secondary | ICD-10-CM

## 2015-02-05 MED ORDER — CLONIDINE HCL 0.1 MG PO TABS: 0.1000 mg | ORAL_TABLET | Freq: Two times a day (BID) | ORAL | Status: DC

## 2015-02-05 MED ORDER — MELOXICAM 15 MG PO TABS: 15.0000 mg | ORAL_TABLET | Freq: Every day | ORAL | Status: DC

## 2015-02-05 NOTE — Addendum Note (Signed)
Addended by: Rolena Infante on: 02/05/2015 11:52 AM   Modules accepted: Orders

## 2015-02-05 NOTE — Patient Instructions (Signed)
RICE: Routine Care for Injuries The routine care of many injuries includes Rest, Ice, Compression, and Elevation (RICE). HOME CARE INSTRUCTIONS  Rest is needed to allow your body to heal. Routine activities can usually be resumed when comfortable. Injured tendons and bones can take up to 6 weeks to heal. Tendons are the cord-like structures that attach muscle to bone.  Ice following an injury helps keep the swelling down and reduces pain.  Put ice in a plastic bag.  Place a towel between your skin and the bag.  Leave the ice on for 15-20 minutes, 3-4 times a day, or as directed by your health care provider. Do this while awake, for the first 24 to 48 hours. After that, continue as directed by your caregiver.  Compression helps keep swelling down. It also gives support and helps with discomfort. If an elastic bandage has been applied, it should be removed and reapplied every 3 to 4 hours. It should not be applied tightly, but firmly enough to keep swelling down. Watch fingers or toes for swelling, bluish discoloration, coldness, numbness, or excessive pain. If any of these problems occur, remove the bandage and reapply loosely. Contact your caregiver if these problems continue.  Elevation helps reduce swelling and decreases pain. With extremities, such as the arms, hands, legs, and feet, the injured area should be placed near or above the level of the heart, if possible. SEEK IMMEDIATE MEDICAL CARE IF:  You have persistent pain and swelling.  You develop redness, numbness, or unexpected weakness.  Your symptoms are getting worse rather than improving after several days. These symptoms may indicate that further evaluation or further X-rays are needed. Sometimes, X-rays may not show a small broken bone (fracture) until 1 week or 10 days later. Make a follow-up appointment with your caregiver. Ask when your X-ray results will be ready. Make sure you get your X-ray results. Document Released:  02/26/2001 Document Revised: 11/19/2013 Document Reviewed: 04/15/2011 ExitCare Patient Information 2015 [employer]. This information is not intended to replace advice given to you by your health care provider. Make sure you discuss any questions you have with your health care provider.  

## 2015-02-05 NOTE — Progress Notes (Signed)
Subjective:    Patient ID: [name], female    DOB: September 06, 1951, 64 y.o.   MRN: [id number]   Patient is actually in today with c/o right knee pain, but has not been seen for follow up in almost a year. SHe says that her blood pressure has been elevated and she has been having headaches.   Hypertension This is a chronic problem. The current episode started more than 1 year ago. The problem has been gradually worsening since onset. The problem is uncontrolled. Associated symptoms include headaches. Pertinent negatives include no palpitations, peripheral edema or sweats. Risk factors for coronary artery disease include dyslipidemia, obesity, post-menopausal state and sedentary lifestyle. Past treatments include beta blockers, central alpha agonists and angiotensin blockers. The current treatment provides mild improvement. Compliance problems include diet and exercise.  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  Knee Pain  The incident occurred more than 1 week ago. There was no injury mechanism. The pain is present in the right knee. The quality of the pain is described as shooting. The pain is at a severity of 10/10. The pain is severe. The pain has been intermittent since onset. Associated symptoms include a loss of motion. Pertinent negatives include no muscle weakness, numbness or tingling. The symptoms are aggravated by weight bearing. She has tried heat, ice and acetaminophen for the symptoms. The treatment provided no relief.  Hyperlipidemia This is a chronic problem. The current episode started more than 1 year ago. The problem is uncontrolled. Recent lipid tests were reviewed and are variable. Exacerbating diseases include obesity. She has no history of diabetes or hypothyroidism. Current antihyperlipidemic treatment includes statins. The current treatment provides mild improvement of lipids. Compliance problems include adherence to diet and adherence to exercise.  Risk  factors for coronary artery disease include dyslipidemia, hypertension, obesity and post-menopausal.     Review of Systems  Constitutional: Negative.   Eyes: Negative.   Respiratory: Negative.   Cardiovascular: Negative for palpitations.  Endocrine: Negative.   Musculoskeletal: Positive for arthralgias (right knee).  Neurological: Positive for headaches. Negative for tingling and numbness.  All other systems reviewed and are negative.      Objective:   Physical Exam  Constitutional: She is oriented to person, place, and time. She appears well-developed and well-nourished.  HENT:  Nose: Nose normal.  Mouth/Throat: Oropharynx is clear and moist.  Eyes: EOM are normal.  Neck: Trachea normal, normal range of motion and full passive range of motion without pain. Neck supple. No JVD present. Carotid bruit is not present. No thyromegaly present.  Cardiovascular: Normal rate, regular rhythm, normal heart sounds and intact distal pulses.  Exam reveals no gallop and no friction rub.   No murmur heard. Pulmonary/Chest: Effort normal and breath sounds normal.  Abdominal: Soft. Bowel sounds are normal. She exhibits no distension and no mass. There is no tenderness.  Musculoskeletal: Normal range of motion.  FROM of right knee with pain on  Flexion and extension No effusion All ligaments intact  Lymphadenopathy:    She has no cervical adenopathy.  Neurological: She is alert and oriented to person, place, and time. She has normal reflexes.  Skin: Skin is warm and dry.  Psychiatric: She has a normal mood and affect. Her behavior is normal. Judgment and thought content normal.    BP 194/111 mmHg  Pulse 105  Temp(Src) 96.6 F (35.9 C) (Oral)  Ht 5' 6" (1.676 m)  Wt 234 lb (106.142 kg)  BMI 37.79 kg/m2  Right knee xray- mild joint space narrowing otherwise normal-Preliminary reading by Ronnald Collum, FNP  [facility]       Assessment & Plan:  1. Essential hypertension Do not add salt to  diet Clonidine tablets BID - CMP14+EGFR  2. Hyperlipidemia Low fat diet - NMR, lipoprofile  3. Right knee pain Rest  Ice as needed Compression wrap when walking Elevate when sitting - DG Knee 1-2 Views Right; Future - meloxicam (MOBIC) 15 MG tablet; Take 1 tablet (15 mg total) by mouth daily.  Dispense: 30 tablet; Refill: 3    Labs pending Health maintenance reviewed Diet and exercise encouraged Continue all meds Follow up  In 3 months   [place], FNP

## 2015-02-17 ENCOUNTER — Telehealth: Payer: Self-pay | Admitting: Nurse Practitioner

## 2015-02-17 DIAGNOSIS — I10 Essential (primary) hypertension: Secondary | ICD-10-CM | POA: Diagnosis not present

## 2015-02-17 DIAGNOSIS — D649 Anemia, unspecified: Secondary | ICD-10-CM | POA: Diagnosis not present

## 2015-02-17 DIAGNOSIS — M25561 Pain in right knee: Secondary | ICD-10-CM

## 2015-02-17 DIAGNOSIS — E559 Vitamin D deficiency, unspecified: Secondary | ICD-10-CM | POA: Diagnosis not present

## 2015-02-17 DIAGNOSIS — R809 Proteinuria, unspecified: Secondary | ICD-10-CM | POA: Diagnosis not present

## 2015-02-17 DIAGNOSIS — N183 Chronic kidney disease, stage 3 (moderate): Secondary | ICD-10-CM | POA: Diagnosis not present

## 2015-02-17 NOTE — Telephone Encounter (Signed)
What is making her fall- her knee pain or what- Is weakness?

## 2015-02-17 NOTE — Telephone Encounter (Signed)
Patient states that she is falling from the pain and her right leg is weak due to the pain

## 2015-02-17 NOTE — Telephone Encounter (Signed)
Patient was seen last week and that she is still falling and her right leg is not improving and that you stated that she may need to see orthopedic provider. Please advise

## 2015-02-17 NOTE — Telephone Encounter (Signed)
Referral made to ortho

## 2015-02-17 NOTE — Telephone Encounter (Signed)
Attempted to call patient to find out what is going on. Note says that she sounds confused- does she need to go to ER.

## 2015-02-17 NOTE — Telephone Encounter (Signed)
I spoke with patient and she did not sound confused and she is wanting advise on what her next step is in regards to do about the falling and leg pain

## 2015-02-18 DIAGNOSIS — E872 Acidosis: Secondary | ICD-10-CM | POA: Diagnosis not present

## 2015-02-18 DIAGNOSIS — I639 Cerebral infarction, unspecified: Secondary | ICD-10-CM | POA: Diagnosis not present

## 2015-02-18 DIAGNOSIS — I10 Essential (primary) hypertension: Secondary | ICD-10-CM | POA: Diagnosis not present

## 2015-02-18 DIAGNOSIS — R51 Headache: Secondary | ICD-10-CM | POA: Diagnosis not present

## 2015-02-18 DIAGNOSIS — R410 Disorientation, unspecified: Secondary | ICD-10-CM | POA: Diagnosis not present

## 2015-02-18 DIAGNOSIS — R569 Unspecified convulsions: Secondary | ICD-10-CM | POA: Diagnosis not present

## 2015-02-18 DIAGNOSIS — N179 Acute kidney failure, unspecified: Secondary | ICD-10-CM | POA: Insufficient documentation

## 2015-02-18 DIAGNOSIS — F319 Bipolar disorder, unspecified: Secondary | ICD-10-CM | POA: Diagnosis not present

## 2015-02-18 DIAGNOSIS — G9340 Encephalopathy, unspecified: Secondary | ICD-10-CM | POA: Diagnosis not present

## 2015-02-18 DIAGNOSIS — G9341 Metabolic encephalopathy: Secondary | ICD-10-CM | POA: Insufficient documentation

## 2015-02-18 DIAGNOSIS — R4020 Unspecified coma: Secondary | ICD-10-CM | POA: Diagnosis not present

## 2015-02-18 DIAGNOSIS — R401 Stupor: Secondary | ICD-10-CM | POA: Diagnosis not present

## 2015-02-18 DIAGNOSIS — J9811 Atelectasis: Secondary | ICD-10-CM | POA: Diagnosis not present

## 2015-02-18 DIAGNOSIS — R079 Chest pain, unspecified: Secondary | ICD-10-CM | POA: Diagnosis not present

## 2015-02-18 DIAGNOSIS — R4182 Altered mental status, unspecified: Secondary | ICD-10-CM | POA: Diagnosis not present

## 2015-02-18 DIAGNOSIS — F329 Major depressive disorder, single episode, unspecified: Secondary | ICD-10-CM | POA: Diagnosis not present

## 2015-02-18 DIAGNOSIS — E875 Hyperkalemia: Secondary | ICD-10-CM | POA: Diagnosis not present

## 2015-02-24 ENCOUNTER — Telehealth: Payer: Self-pay | Admitting: Nurse Practitioner

## 2015-02-25 NOTE — Telephone Encounter (Signed)
Pt given appt tomorrow with MMM at 4:15.

## 2015-02-26 ENCOUNTER — Ambulatory Visit (INDEPENDENT_AMBULATORY_CARE_PROVIDER_SITE_OTHER): Payer: Medicare Other | Admitting: Nurse Practitioner

## 2015-02-26 ENCOUNTER — Other Ambulatory Visit: Payer: Self-pay | Admitting: Nurse Practitioner

## 2015-02-26 ENCOUNTER — Encounter: Payer: Self-pay | Admitting: Nurse Practitioner

## 2015-02-26 VITALS — BP 121/63 | HR 94 | Temp 98.3°F | Ht 66.0 in | Wt 229.8 lb

## 2015-02-26 DIAGNOSIS — R7989 Other specified abnormal findings of blood chemistry: Secondary | ICD-10-CM

## 2015-02-26 DIAGNOSIS — F32A Depression, unspecified: Secondary | ICD-10-CM

## 2015-02-26 DIAGNOSIS — E876 Hypokalemia: Secondary | ICD-10-CM

## 2015-02-26 DIAGNOSIS — R748 Abnormal levels of other serum enzymes: Secondary | ICD-10-CM

## 2015-02-26 DIAGNOSIS — E870 Hyperosmolality and hypernatremia: Secondary | ICD-10-CM | POA: Diagnosis not present

## 2015-02-26 DIAGNOSIS — Z09 Encounter for follow-up examination after completed treatment for conditions other than malignant neoplasm: Secondary | ICD-10-CM | POA: Diagnosis not present

## 2015-02-26 DIAGNOSIS — F329 Major depressive disorder, single episode, unspecified: Secondary | ICD-10-CM | POA: Diagnosis not present

## 2015-02-26 MED ORDER — SERTRALINE HCL 50 MG PO TABS: 50.0000 mg | ORAL_TABLET | Freq: Every day | ORAL | Status: DC

## 2015-02-26 NOTE — Telephone Encounter (Signed)
Patient has appointment today with Ronnald Collum, FNP.

## 2015-02-26 NOTE — Progress Notes (Signed)
   Subjective:    Patient ID: [name], female    DOB: June 26, 1951, 64 y.o.   MRN: [id number]  [facility]- SH ehad originally gone to hospital because she kept falling. They kept her over night and gave her fluids. SHe says she feel much better now except she feels depressed. Scored 18 on depression sceen. SHe ius currently on cymbalta $RemoveBef'90mg'ilCCNnvzFF$  daily which is not helping with depression but helps her with her  Chronic pain.    Review of Systems  Constitutional: Negative.   HENT: Negative.   Respiratory: Negative.   Cardiovascular: Negative.   Genitourinary: Negative.   Neurological: Negative.   Psychiatric/Behavioral: Negative.   All other systems reviewed and are negative.      Objective:   Physical Exam  Constitutional: She is oriented to person, place, and time. She appears well-developed and well-nourished.  Cardiovascular: Normal rate, regular rhythm and normal heart sounds.   Pulmonary/Chest: Effort normal and breath sounds normal.  Neurological: She is alert and oriented to person, place, and time.  Skin: Skin is warm.  Psychiatric: Her behavior is normal. Judgment and thought content normal.  Sad affect- good eye contact- answers all questions appropriately.   BP 121/63 mmHg  Pulse 94  Temp(Src) 98.3 F (36.8 C) (Oral)  Ht $R'5\' 6"'yy$  (1.676 m)  Wt 229 lb 12.8 oz (104.237 kg)  BMI 37.11 kg/m2        Assessment & Plan:  1. Hospital discharge follow-up Hospital records reviewed  2. Hypernatremia 3. Hypokalemia - CMP14+EGFR  4. Depression *stress management Medication side effects discussed RTO in 3 weeks follow up - sertraline (ZOLOFT) 50 MG tablet; Take 1 tablet (50 mg total) by mouth daily.  Dispense: 30 tablet; Refill: [place], FNP

## 2015-02-26 NOTE — Patient Instructions (Signed)
Stress and Stress Management Stress is a normal reaction to life events. It is what you feel when life demands more than you are used to or more than you can handle. Some stress can be useful. For example, the stress reaction can help you catch the last bus of the day, study for a test, or meet a deadline at work. But stress that occurs too often or for too long can cause problems. It can affect your emotional health and interfere with relationships and normal daily activities. Too much stress can weaken your immune system and increase your risk for physical illness. If you already have a medical problem, stress can make it worse. CAUSES  All sorts of life events may cause stress. An event that causes stress for one person may not be stressful for another person. Major life events commonly cause stress. These may be positive or negative. Examples include losing your job, moving into a new home, getting married, having a baby, or losing a loved one. Less obvious life events may also cause stress, especially if they occur day after day or in combination. Examples include working long hours, driving in traffic, caring for children, being in debt, or being in a difficult relationship. SIGNS AND SYMPTOMS Stress may cause emotional symptoms including, the following:  Anxiety. This is feeling worried, afraid, on edge, overwhelmed, or out of control.  Anger. This is feeling irritated or impatient.  Depression. This is feeling sad, down, helpless, or guilty.  Difficulty focusing, remembering, or making decisions. Stress may cause physical symptoms, including the following:   Aches and pains. These may affect your head, neck, back, stomach, or other areas of your body.  Tight muscles or clenched jaw.  Low energy or trouble sleeping. Stress may cause unhealthy behaviors, including the following:   Eating to feel better (overeating) or skipping meals.  Sleeping too little, too much, or both.  Working  too much or putting off tasks (procrastination).  Smoking, drinking alcohol, or using drugs to feel better. DIAGNOSIS  Stress is diagnosed through an assessment by your health care provider. Your health care provider will ask questions about your symptoms and any stressful life events.Your health care provider will also ask about your medical history and may order blood tests or other tests. Certain medical conditions and medicine can cause physical symptoms similar to stress. Mental illness can cause emotional symptoms and unhealthy behaviors similar to stress. Your health care provider may refer you to a mental health professional for further evaluation.  TREATMENT  Stress management is the recommended treatment for stress.The goals of stress management are reducing stressful life events and coping with stress in healthy ways.  Techniques for reducing stressful life events include the following:  Stress identification. Self-monitor for stress and identify what causes stress for you. These skills may help you to avoid some stressful events.  Time management. Set your priorities, keep a calendar of events, and learn to say "no." These tools can help you avoid making too many commitments. Techniques for coping with stress include the following:  Rethinking the problem. Try to think realistically about stressful events rather than ignoring them or overreacting. Try to find the positives in a stressful situation rather than focusing on the negatives.  Exercise. Physical exercise can release both physical and emotional tension. The key is to find a form of exercise you enjoy and do it regularly.  Relaxation techniques. These relax the body and mind. Examples include yoga, meditation, tai chi, biofeedback, deep  breathing, progressive muscle relaxation, listening to music, being out in nature, journaling, and other hobbies. Again, the key is to find one or more that you enjoy and can do  regularly.  Healthy lifestyle. Eat a balanced diet, get plenty of sleep, and do not smoke. Avoid using alcohol or drugs to relax.  Strong support network. Spend time with family, friends, or other people you enjoy being around.Express your feelings and talk things over with someone you trust. Counseling or talktherapy with a mental health professional may be helpful if you are having difficulty managing stress on your own. Medicine is typically not recommended for the treatment of stress.Talk to your health care provider if you think you need medicine for symptoms of stress. HOME CARE INSTRUCTIONS  Keep all follow-up visits as directed by your health care provider.  Take all medicines as directed by your health care provider. SEEK MEDICAL CARE IF:  Your symptoms get worse or you start having new symptoms.  You feel overwhelmed by your problems and can no longer manage them on your own. SEEK IMMEDIATE MEDICAL CARE IF:  You feel like hurting yourself or someone else. Document Released: 05/10/2001 Document Revised: 03/31/2014 Document Reviewed: 07/09/2013 ExitCare Patient Information 2015 [employer]. This information is not intended to replace advice given to you by your health care provider. Make sure you discuss any questions you have with your health care provider.  

## 2015-02-27 DIAGNOSIS — F319 Bipolar disorder, unspecified: Secondary | ICD-10-CM | POA: Diagnosis not present

## 2015-02-27 DIAGNOSIS — N183 Chronic kidney disease, stage 3 (moderate): Secondary | ICD-10-CM | POA: Diagnosis not present

## 2015-02-27 DIAGNOSIS — I129 Hypertensive chronic kidney disease with stage 1 through stage 4 chronic kidney disease, or unspecified chronic kidney disease: Secondary | ICD-10-CM | POA: Diagnosis not present

## 2015-02-27 DIAGNOSIS — G40209 Localization-related (focal) (partial) symptomatic epilepsy and epileptic syndromes with complex partial seizures, not intractable, without status epilepticus: Secondary | ICD-10-CM | POA: Diagnosis not present

## 2015-02-27 LAB — CMP14+EGFR
ALT: 26 IU/L (ref 0–32)
AST: 19 IU/L (ref 0–40)
Albumin/Globulin Ratio: 1.3 (ref 1.1–2.5)
Albumin: 3.5 g/dL — ABNORMAL LOW (ref 3.6–4.8)
Alkaline Phosphatase: 115 IU/L (ref 39–117)
BUN/Creatinine Ratio: 22 (ref 11–26)
BUN: 41 mg/dL — ABNORMAL HIGH (ref 8–27)
Bilirubin Total: 0.2 mg/dL (ref 0.0–1.2)
CO2: 20 mmol/L (ref 18–29)
Calcium: 9.1 mg/dL (ref 8.7–10.3)
Chloride: 104 mmol/L (ref 97–108)
Creatinine, Ser: 1.85 mg/dL — ABNORMAL HIGH (ref 0.57–1.00)
GFR calc Af Amer: 33 mL/min/{1.73_m2} — ABNORMAL LOW (ref >59)
GFR calc non Af Amer: 29 mL/min/{1.73_m2} — ABNORMAL LOW (ref >59)
Globulin, Total: 2.8 g/dL (ref 1.5–4.5)
Glucose: 98 mg/dL (ref 65–99)
Potassium: 4.9 mmol/L (ref 3.5–5.2)
Sodium: 142 mmol/L (ref 134–144)
Total Protein: 6.3 g/dL (ref 6.0–8.5)

## 2015-02-27 NOTE — Addendum Note (Signed)
Addended by: Thana Ates on: 02/27/2015 10:13 AM   Modules accepted: Orders

## 2015-03-03 DIAGNOSIS — F319 Bipolar disorder, unspecified: Secondary | ICD-10-CM | POA: Diagnosis not present

## 2015-03-03 DIAGNOSIS — I129 Hypertensive chronic kidney disease with stage 1 through stage 4 chronic kidney disease, or unspecified chronic kidney disease: Secondary | ICD-10-CM | POA: Diagnosis not present

## 2015-03-03 DIAGNOSIS — N183 Chronic kidney disease, stage 3 (moderate): Secondary | ICD-10-CM | POA: Diagnosis not present

## 2015-03-03 DIAGNOSIS — G40209 Localization-related (focal) (partial) symptomatic epilepsy and epileptic syndromes with complex partial seizures, not intractable, without status epilepticus: Secondary | ICD-10-CM | POA: Diagnosis not present

## 2015-03-09 DIAGNOSIS — F319 Bipolar disorder, unspecified: Secondary | ICD-10-CM | POA: Diagnosis not present

## 2015-03-09 DIAGNOSIS — G40209 Localization-related (focal) (partial) symptomatic epilepsy and epileptic syndromes with complex partial seizures, not intractable, without status epilepticus: Secondary | ICD-10-CM | POA: Diagnosis not present

## 2015-03-09 DIAGNOSIS — I129 Hypertensive chronic kidney disease with stage 1 through stage 4 chronic kidney disease, or unspecified chronic kidney disease: Secondary | ICD-10-CM | POA: Diagnosis not present

## 2015-03-09 DIAGNOSIS — N183 Chronic kidney disease, stage 3 (moderate): Secondary | ICD-10-CM | POA: Diagnosis not present

## 2015-03-10 ENCOUNTER — Other Ambulatory Visit: Payer: Self-pay | Admitting: Nurse Practitioner

## 2015-03-10 ENCOUNTER — Telehealth: Payer: Self-pay | Admitting: Nurse Practitioner

## 2015-03-10 ENCOUNTER — Other Ambulatory Visit: Payer: Self-pay | Admitting: Family Medicine

## 2015-03-10 MED ORDER — DULOXETINE HCL 30 MG PO CPEP: 30.0000 mg | ORAL_CAPSULE | Freq: Every morning | ORAL | Status: DC

## 2015-03-10 MED ORDER — LORAZEPAM 1 MG PO TABS: 1.0000 mg | ORAL_TABLET | Freq: Two times a day (BID) | ORAL | Status: DC

## 2015-03-10 NOTE — Telephone Encounter (Signed)
Stp and pt given a later appt on Friday due to transportation, new appt time is 9:45.

## 2015-03-10 NOTE — Telephone Encounter (Signed)
Ativan 1mg  1 po BID #60 0 refills- please call in

## 2015-03-11 DIAGNOSIS — G40209 Localization-related (focal) (partial) symptomatic epilepsy and epileptic syndromes with complex partial seizures, not intractable, without status epilepticus: Secondary | ICD-10-CM | POA: Diagnosis not present

## 2015-03-11 DIAGNOSIS — F319 Bipolar disorder, unspecified: Secondary | ICD-10-CM | POA: Diagnosis not present

## 2015-03-11 DIAGNOSIS — I129 Hypertensive chronic kidney disease with stage 1 through stage 4 chronic kidney disease, or unspecified chronic kidney disease: Secondary | ICD-10-CM | POA: Diagnosis not present

## 2015-03-11 DIAGNOSIS — N183 Chronic kidney disease, stage 3 (moderate): Secondary | ICD-10-CM | POA: Diagnosis not present

## 2015-03-11 NOTE — Telephone Encounter (Signed)
Meds called to pharmacy. Patient notified

## 2015-03-13 ENCOUNTER — Ambulatory Visit: Payer: Medicare Other | Admitting: Nurse Practitioner

## 2015-03-13 ENCOUNTER — Encounter: Payer: Self-pay | Admitting: Nurse Practitioner

## 2015-03-13 ENCOUNTER — Ambulatory Visit (INDEPENDENT_AMBULATORY_CARE_PROVIDER_SITE_OTHER): Payer: Medicare Other | Admitting: Nurse Practitioner

## 2015-03-13 VITALS — BP 132/84 | HR 72 | Temp 97.1°F | Ht 66.0 in | Wt 236.0 lb

## 2015-03-13 DIAGNOSIS — G629 Polyneuropathy, unspecified: Secondary | ICD-10-CM | POA: Diagnosis not present

## 2015-03-13 DIAGNOSIS — E785 Hyperlipidemia, unspecified: Secondary | ICD-10-CM

## 2015-03-13 DIAGNOSIS — R569 Unspecified convulsions: Secondary | ICD-10-CM | POA: Diagnosis not present

## 2015-03-13 DIAGNOSIS — F32A Depression, unspecified: Secondary | ICD-10-CM

## 2015-03-13 DIAGNOSIS — K219 Gastro-esophageal reflux disease without esophagitis: Secondary | ICD-10-CM | POA: Diagnosis not present

## 2015-03-13 DIAGNOSIS — F329 Major depressive disorder, single episode, unspecified: Secondary | ICD-10-CM | POA: Diagnosis not present

## 2015-03-13 DIAGNOSIS — G4700 Insomnia, unspecified: Secondary | ICD-10-CM

## 2015-03-13 DIAGNOSIS — I10 Essential (primary) hypertension: Secondary | ICD-10-CM | POA: Diagnosis not present

## 2015-03-13 MED ORDER — LOSARTAN POTASSIUM 25 MG PO TABS: 25.0000 mg | ORAL_TABLET | Freq: Every day | ORAL | Status: DC

## 2015-03-13 MED ORDER — CLONIDINE HCL 0.3 MG/24HR TD PTWK: 0.3000 mg | MEDICATED_PATCH | TRANSDERMAL | Status: DC

## 2015-03-13 MED ORDER — DULOXETINE HCL 30 MG PO CPEP: 30.0000 mg | ORAL_CAPSULE | Freq: Every morning | ORAL | Status: DC

## 2015-03-13 MED ORDER — METOPROLOL TARTRATE 50 MG PO TABS: 50.0000 mg | ORAL_TABLET | Freq: Two times a day (BID) | ORAL | Status: DC

## 2015-03-13 MED ORDER — ZOLPIDEM TARTRATE 10 MG PO TABS: ORAL_TABLET | ORAL | Status: DC

## 2015-03-13 MED ORDER — GABAPENTIN 300 MG PO CAPS
300.0000 mg | ORAL_CAPSULE | Freq: Three times a day (TID) | ORAL | Status: DC
Start: 2015-03-13 — End: 2015-05-31

## 2015-03-13 MED ORDER — DULOXETINE HCL 60 MG PO CPEP: 60.0000 mg | ORAL_CAPSULE | Freq: Every day | ORAL | Status: DC

## 2015-03-13 MED ORDER — AMLODIPINE BESYLATE 5 MG PO TABS: 5.0000 mg | ORAL_TABLET | Freq: Every day | ORAL | Status: DC

## 2015-03-13 MED ORDER — PANTOPRAZOLE SODIUM 40 MG PO TBEC: 40.0000 mg | DELAYED_RELEASE_TABLET | Freq: Every day | ORAL | Status: DC

## 2015-03-13 NOTE — Progress Notes (Signed)
Subjective:    Patient ID: [name], female    DOB: 03-30-1951, 64 y.o.   MRN: [id number]   Patient here today for follow up of chronic medical problems.  Hypertension This is a chronic problem. The current episode started more than 1 year ago. The problem has been gradually worsening since onset. The problem is uncontrolled. Associated symptoms include headaches. Pertinent negatives include no palpitations, peripheral edema or sweats. Risk factors for coronary artery disease include dyslipidemia, obesity, post-menopausal state and sedentary lifestyle. Past treatments include beta blockers, central alpha agonists and angiotensin blockers. The current treatment provides mild improvement. Compliance problems include diet and exercise.  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  Hyperlipidemia This is a chronic problem. The current episode started more than 1 year ago. The problem is uncontrolled. Recent lipid tests were reviewed and are variable. Exacerbating diseases include obesity. She has no history of diabetes or hypothyroidism. Current antihyperlipidemic treatment includes statins. The current treatment provides mild improvement of lipids. Compliance problems include adherence to diet and adherence to exercise.  Risk factors for coronary artery disease include dyslipidemia, hypertension, obesity and post-menopausal.  depression Patient has been struggling with this for several months- tries to stay busy and that seems to help- currently on zoloft 50 mg daily and cymbalta 57m daily- thinks she is doing some better. insomnia ambien to sleep- wakes up at night but is able to go back to sleep. seizures On keppra- Last seizure was  In February- sees neurologist every 6 months. GERD protonix working well to keep symptoms under control Neuropathy bil feet On gabapentin which helps a lot with burning and pain- right foot worse then left      Review of Systems    Constitutional: Negative.   Eyes: Negative.   Respiratory: Negative.   Cardiovascular: Negative for palpitations.  Endocrine: Negative.   Musculoskeletal: Positive for arthralgias (right knee).  Neurological: Positive for headaches.  Psychiatric/Behavioral: Positive for sleep disturbance and dysphoric mood.  All other systems reviewed and are negative.      Objective:   Physical Exam  Constitutional: She is oriented to person, place, and time. She appears well-developed and well-nourished.  HENT:  Nose: Nose normal.  Mouth/Throat: Oropharynx is clear and moist.  Eyes: EOM are normal.  Neck: Trachea normal, normal range of motion and full passive range of motion without pain. Neck supple. No JVD present. Carotid bruit is not present. No thyromegaly present.  Cardiovascular: Normal rate, regular rhythm, normal heart sounds and intact distal pulses.  Exam reveals no gallop and no friction rub.   No murmur heard. Pulmonary/Chest: Effort normal and breath sounds normal.  Diminished breath sounds bil bases  Abdominal: Soft. Bowel sounds are normal. She exhibits no distension and no mass. There is no tenderness.  Musculoskeletal: Normal range of motion. She exhibits no edema.  Lymphadenopathy:    She has no cervical adenopathy.  Neurological: She is alert and oriented to person, place, and time. She has normal reflexes.  Skin: Skin is warm and dry.  Psychiatric: She has a normal mood and affect. Her behavior is normal. Judgment and thought content normal.   BP 132/84 mmHg  Pulse 72  Temp(Src) 97.1 F (36.2 C) (Oral)  Ht 5' 6" (1.676 m)  Wt 236 lb (107.049 kg)  BMI 38.11 kg/m2           Assessment & Plan:   1. Essential hypertension Do not add slat to diet - CMP14+EGFR - metoprolol (LOPRESSOR)  50 MG tablet; Take 1 tablet (50 mg total) by mouth 2 (two) times daily.  Dispense: 60 tablet; Refill: 5 - losartan (COZAAR) 25 MG tablet; Take 1 tablet (25 mg total) by mouth  daily.  Dispense: 30 tablet; Refill: 5 - cloNIDine (CATAPRES - DOSED IN MG/24 HR) 0.3 mg/24hr patch; Place 1 patch (0.3 mg total) onto the skin once a week.  Dispense: 4 patch; Refill: 5 - amLODipine (NORVASC) 5 MG tablet; Take 1 tablet (5 mg total) by mouth daily.  Dispense: 30 tablet; Refill: 5  2. Seizures Keep follow up appointments with neurologist  3. Insomnia Bedtime ritual - zolpidem (AMBIEN) 10 MG tablet; TAKE 1 TABLET BY MOUTH ONCE DAILY AT BEDTIME AS NEEDED FOR SLEEP  Dispense: 30 tablet; Refill: 1  4. Hyperlipidemia Low fat diet - NMR, lipoprofile  5. Depression Stress management  6. Neuropathy - gabapentin (NEURONTIN) 300 MG capsule; Take 1 capsule (300 mg total) by mouth 3 (three) times daily.  Dispense: 30 capsule; Refill: 5 - DULoxetine (CYMBALTA) 60 MG capsule; Take 1 capsule (60 mg total) by mouth at bedtime.  Dispense: 30 capsule; Refill: 5 - DULoxetine (CYMBALTA) 30 MG capsule; Take 1 capsule (30 mg total) by mouth every morning.  Dispense: 30 capsule; Refill: 2  7. Gastroesophageal reflux disease without esophagitis Avoid spicy foods Do not eat 2 hours prior to bedtime - pantoprazole (PROTONIX) 40 MG tablet; Take 1 tablet (40 mg total) by mouth daily.  Dispense: 30 tablet; Refill: 5    Labs pending Health maintenance reviewed Diet and exercise encouraged Continue all meds Follow up  In 3 months    Mary-Margaret , FNP     

## 2015-03-13 NOTE — Patient Instructions (Signed)

## 2015-03-15 LAB — NMR, LIPOPROFILE
Cholesterol: 147 mg/dL (ref 100–199)
HDL Cholesterol by NMR: 58 mg/dL (ref >39)
HDL Particle Number: 30.6 umol/L (ref >=30.5)
LDL Particle Number: 328 nmol/L (ref <1000)
LDL Size: 21.1 nm (ref >20.5)
LDL-C: 63 mg/dL (ref 0–99)
LP-IR Score: <25 (ref <=45)
Small LDL Particle Number: <90 nmol/L (ref <=527)
Triglycerides by NMR: 132 mg/dL (ref 0–149)

## 2015-03-15 LAB — CMP14+EGFR
ALT: 12 IU/L (ref 0–32)
AST: 13 IU/L (ref 0–40)
Albumin/Globulin Ratio: 1.3 (ref 1.1–2.5)
Albumin: 3.7 g/dL (ref 3.6–4.8)
Alkaline Phosphatase: 116 IU/L (ref 39–117)
BUN/Creatinine Ratio: 13 (ref 11–26)
BUN: 20 mg/dL (ref 8–27)
Bilirubin Total: 0.2 mg/dL (ref 0.0–1.2)
CO2: 22 mmol/L (ref 18–29)
Calcium: 8.4 mg/dL — ABNORMAL LOW (ref 8.7–10.3)
Chloride: 106 mmol/L (ref 97–108)
Creatinine, Ser: 1.56 mg/dL — ABNORMAL HIGH (ref 0.57–1.00)
GFR calc Af Amer: 40 mL/min/{1.73_m2} — ABNORMAL LOW (ref >59)
GFR calc non Af Amer: 35 mL/min/{1.73_m2} — ABNORMAL LOW (ref >59)
Globulin, Total: 2.8 g/dL (ref 1.5–4.5)
Glucose: 86 mg/dL (ref 65–99)
Potassium: 5.1 mmol/L (ref 3.5–5.2)
Sodium: 143 mmol/L (ref 134–144)
Total Protein: 6.5 g/dL (ref 6.0–8.5)

## 2015-03-16 DIAGNOSIS — I129 Hypertensive chronic kidney disease with stage 1 through stage 4 chronic kidney disease, or unspecified chronic kidney disease: Secondary | ICD-10-CM | POA: Diagnosis not present

## 2015-03-16 DIAGNOSIS — N183 Chronic kidney disease, stage 3 (moderate): Secondary | ICD-10-CM | POA: Diagnosis not present

## 2015-03-16 DIAGNOSIS — G40209 Localization-related (focal) (partial) symptomatic epilepsy and epileptic syndromes with complex partial seizures, not intractable, without status epilepticus: Secondary | ICD-10-CM | POA: Diagnosis not present

## 2015-03-16 DIAGNOSIS — F319 Bipolar disorder, unspecified: Secondary | ICD-10-CM | POA: Diagnosis not present

## 2015-03-17 ENCOUNTER — Telehealth: Payer: Self-pay | Admitting: Nurse Practitioner

## 2015-03-17 NOTE — Telephone Encounter (Signed)
Patient aware of results.

## 2015-03-18 DIAGNOSIS — M7061 Trochanteric bursitis, right hip: Secondary | ICD-10-CM | POA: Diagnosis not present

## 2015-03-20 DIAGNOSIS — I129 Hypertensive chronic kidney disease with stage 1 through stage 4 chronic kidney disease, or unspecified chronic kidney disease: Secondary | ICD-10-CM | POA: Diagnosis not present

## 2015-03-20 DIAGNOSIS — F319 Bipolar disorder, unspecified: Secondary | ICD-10-CM | POA: Diagnosis not present

## 2015-03-20 DIAGNOSIS — G40209 Localization-related (focal) (partial) symptomatic epilepsy and epileptic syndromes with complex partial seizures, not intractable, without status epilepticus: Secondary | ICD-10-CM | POA: Diagnosis not present

## 2015-03-20 DIAGNOSIS — N183 Chronic kidney disease, stage 3 (moderate): Secondary | ICD-10-CM | POA: Diagnosis not present

## 2015-03-23 DIAGNOSIS — G40209 Localization-related (focal) (partial) symptomatic epilepsy and epileptic syndromes with complex partial seizures, not intractable, without status epilepticus: Secondary | ICD-10-CM | POA: Diagnosis not present

## 2015-03-23 DIAGNOSIS — N183 Chronic kidney disease, stage 3 (moderate): Secondary | ICD-10-CM | POA: Diagnosis not present

## 2015-03-23 DIAGNOSIS — F319 Bipolar disorder, unspecified: Secondary | ICD-10-CM | POA: Diagnosis not present

## 2015-03-23 DIAGNOSIS — I129 Hypertensive chronic kidney disease with stage 1 through stage 4 chronic kidney disease, or unspecified chronic kidney disease: Secondary | ICD-10-CM | POA: Diagnosis not present

## 2015-03-27 ENCOUNTER — Encounter (HOSPITAL_COMMUNITY): Payer: Medicare Other | Attending: Hematology & Oncology

## 2015-03-27 ENCOUNTER — Encounter (HOSPITAL_COMMUNITY): Payer: Self-pay | Admitting: Hematology & Oncology

## 2015-03-27 ENCOUNTER — Ambulatory Visit (HOSPITAL_COMMUNITY): Payer: Medicare Other | Admitting: Hematology & Oncology

## 2015-03-27 ENCOUNTER — Encounter (HOSPITAL_BASED_OUTPATIENT_CLINIC_OR_DEPARTMENT_OTHER): Payer: Medicare Other | Admitting: Hematology & Oncology

## 2015-03-27 VITALS — BP 140/74 | HR 57 | Temp 97.9°F | Resp 16 | Wt 229.6 lb

## 2015-03-27 DIAGNOSIS — D472 Monoclonal gammopathy: Secondary | ICD-10-CM | POA: Insufficient documentation

## 2015-03-27 DIAGNOSIS — N189 Chronic kidney disease, unspecified: Secondary | ICD-10-CM

## 2015-03-27 LAB — CBC WITH DIFFERENTIAL/PLATELET
Basophils Absolute: 0.1 10*3/uL (ref 0.0–0.1)
Basophils Relative: 1 % (ref 0–1)
Eosinophils Absolute: 0.2 10*3/uL (ref 0.0–0.7)
Eosinophils Relative: 3 % (ref 0–5)
HCT: 42.1 % (ref 36.0–46.0)
Hemoglobin: 13.3 g/dL (ref 12.0–15.0)
Lymphocytes Relative: 23 % (ref 12–46)
Lymphs Abs: 2.2 10*3/uL (ref 0.7–4.0)
MCH: 31 pg (ref 26.0–34.0)
MCHC: 31.6 g/dL (ref 30.0–36.0)
MCV: 98.1 fL (ref 78.0–100.0)
Monocytes Absolute: 0.5 10*3/uL (ref 0.1–1.0)
Monocytes Relative: 5 % (ref 3–12)
Neutro Abs: 6.4 10*3/uL (ref 1.7–7.7)
Neutrophils Relative %: 68 % (ref 43–77)
Platelets: 419 10*3/uL — ABNORMAL HIGH (ref 150–400)
RBC: 4.29 MIL/uL (ref 3.87–5.11)
RDW: 12.8 % (ref 11.5–15.5)
WBC: 9.3 10*3/uL (ref 4.0–10.5)

## 2015-03-27 LAB — COMPREHENSIVE METABOLIC PANEL
ALT: 28 U/L (ref 0–35)
AST: 20 U/L (ref 0–37)
Albumin: 3.6 g/dL (ref 3.5–5.2)
Alkaline Phosphatase: 123 U/L — ABNORMAL HIGH (ref 39–117)
Anion gap: 8 (ref 5–15)
BUN: 57 mg/dL — ABNORMAL HIGH (ref 6–23)
CO2: 24 mmol/L (ref 19–32)
Calcium: 8.8 mg/dL (ref 8.4–10.5)
Chloride: 106 mmol/L (ref 96–112)
Creatinine, Ser: 2.16 mg/dL — ABNORMAL HIGH (ref 0.50–1.10)
GFR calc Af Amer: 27 mL/min — ABNORMAL LOW (ref >90)
GFR calc non Af Amer: 23 mL/min — ABNORMAL LOW (ref >90)
Glucose, Bld: 100 mg/dL — ABNORMAL HIGH (ref 70–99)
Potassium: 5.1 mmol/L (ref 3.5–5.1)
Sodium: 138 mmol/L (ref 135–145)
Total Bilirubin: 0.4 mg/dL (ref 0.3–1.2)
Total Protein: 7.4 g/dL (ref 6.0–8.3)

## 2015-03-27 LAB — LACTATE DEHYDROGENASE: LDH: 137 U/L (ref 94–250)

## 2015-03-27 NOTE — Patient Instructions (Signed)
[place] at [facility] Discharge Instructions  RECOMMENDATIONS MADE BY THE CONSULTANT AND ANY TEST RESULTS WILL BE SENT TO YOUR REFERRING PHYSICIAN.  Exam and discussion by Dr. Whitney Muse. Report fevers, night sweats, or other concerns.  Follow-up in 6 months with labs and office visit.  Thank you for choosing [place] at [facility] to provide your oncology and hematology care.  To afford each patient quality time with our provider, please arrive at least 15 minutes before your scheduled appointment time.    You need to re-schedule your appointment should you arrive 10 or more minutes late.  We strive to give you quality time with our providers, and arriving late affects you and other patients whose appointments are after yours.  Also, if you no show three or more times for appointments you may be dismissed from the clinic at the providers discretion.     Again, thank you for choosing [facility].  Our hope is that these requests will decrease the amount of time that you wait before being seen by our physicians.       _____________________________________________________________  Should you have questions after your visit to [facility], please contact our office at [phone] between the hours of 8:30 a.m. and 4:30 p.m.  Voicemails left after 4:30 p.m. will not be returned until the following business day.  For prescription refill requests, have your pharmacy contact our office.

## 2015-03-27 NOTE — Progress Notes (Addendum)
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 female returns for follow up.  Reports depression and fluctuating potassium levels. Dr. Hassell Done is her PCP. Has mammogram every year. Never had a colonoscopy, replied "what is it?"   Has nerve damage on her right side. She uses a cane for ambulation She doesn't sleep well. She has questions about her bone marrow biopsy done in April of last year.    MEDICAL HISTORY: Past Medical History  Diagnosis Date  . Renal insufficiency   . Seizures   . Chronic headaches   . Hypertension   . Osteoporosis   . Depression   . 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 URI (upper respiratory infection) 08/2014    early October    has Essential hypertension; DOE (dyspnea on exertion); CHEST PAIN-UNSPECIFIED; Insomnia; Depression; Seizures; Hyperlipidemia; MGUS (monoclonal gammopathy of unknown significance); Diaphragm paralysis; Gastroesophageal reflux disease without esophagitis; and Neuropathy on her problem list.     is allergic to asa; penicillins; sulfonamide derivatives; and tylenol with codeine #3.  [name] had no medications administered during this visit.   SURGICAL HISTORY: Past Surgical History  Procedure Laterality Date  . Breast reduction surgery    . Abdominal hysterectomy    . Lung lobectomy      rt lung-cyst   . Appendectomy    . Cholecystectomy    . Back surgery  2002    SOCIAL HISTORY: History   Social History  . Marital Status: Widowed    Spouse Name: N/A  . Number of Children: N/A  . Years of Education: N/A   Occupational History  . unemployed    Social History Main Topics  . Smoking status: Former Smoker -- 1.00 packs/day for 30 years    Types: Cigarettes    Quit date:  11/13/2012  . Smokeless tobacco: Never Used  . Alcohol Use: No  . Drug Use: No  . Sexual Activity: Not on file   Other Topics Concern  . Not on file   Social History Narrative    FAMILY HISTORY: Family History  Problem Relation Age of Onset  . Hypertension Mother   . Heart disease Mother   . Diabetes Mother   . Kidney disease Mother   . Arthritis Mother   . Hypertension Father     Review of Systems  Constitutional: Negative for fever, chills and weight loss.  HENT: Negative for hearing loss.   Eyes: Negative for blurred vision and double vision.  Respiratory: Negative for cough.   Cardiovascular: Negative for chest pain and palpitations.  Gastrointestinal: Negative for nausea, vomiting, abdominal pain, diarrhea, constipation and blood in stool.  Genitourinary: Negative for dysuria, urgency, frequency and hematuria.  Musculoskeletal: Positive for joint pain. Negative for myalgias and back pain.       Joint pain mostly in hips and right leg  Skin: Negative for rash.  Neurological: Negative for dizziness, tingling and headaches.  Psychiatric/Behavioral: Negative for depression.  14 point review of systems was performed and is negative except as detailed under history of present illness and above   PHYSICAL EXAMINATION  ECOG PERFORMANCE STATUS: 1 - Symptomatic but completely ambulatory  Filed Vitals:   03/27/15 1234  BP: 140/74  Pulse: 57  Temp: 97.9 F (36.6 C)  Resp: 16    Physical  Exam  Constitutional: She is oriented to person, place, and time and well-developed, well-nourished, and in no distress.  HENT:  Head: Normocephalic and atraumatic.  Nose: Nose normal.  Mouth/Throat: Oropharynx is clear and moist. No oropharyngeal exudate.  Eyes: Conjunctivae and EOM are normal. Pupils are equal, round, and reactive to light. Right eye exhibits no discharge. Left eye exhibits no discharge. No scleral icterus.  Neck: Normal range of motion. Neck supple. No tracheal  deviation present. No thyromegaly present.  Cardiovascular: Normal rate, regular rhythm and normal heart sounds.  Exam reveals no gallop and no friction rub.   No murmur heard. Occasional ectopy  Pulmonary/Chest: Effort normal and breath sounds normal. She has no wheezes. She has no rales.  Abdominal: Soft. Bowel sounds are normal. She exhibits no distension and no mass. There is no tenderness. There is no rebound and no guarding.  Musculoskeletal: Normal range of motion. She exhibits no edema.  Lymphadenopathy:    She has no cervical adenopathy.  Neurological: She is alert and oriented to person, place, and time. She has normal reflexes. No cranial nerve deficit. Gait normal. Coordination normal.  Skin: Skin is warm and dry. No rash noted.  Psychiatric: Mood, memory, affect and judgment normal.  Nursing note and vitals reviewed.   LABORATORY DATA:  CBC Results for Vanessa, Richardson (MRN [id number])   Ref. Range 03/27/2015 12:51  Sodium Latest Ref Range: 135-145 mmol/L 138  Potassium Latest Ref Range: 3.5-5.1 mmol/L 5.1  Chloride Latest Ref Range: 96-112 mmol/L 106  CO2 Latest Ref Range: 19-32 mmol/L 24  BUN Latest Ref Range: 6-23 mg/dL 57 (H)  Creatinine Latest Ref Range: 0.50-1.10 mg/dL 2.16 (H)  Calcium Latest Ref Range: 8.4-10.5 mg/dL 8.8  EGFR (Non-African Amer.) Latest Ref Range: >90 mL/min 23 (L)  EGFR (African American) Latest Ref Range: >90 mL/min 27 (L)  Glucose Latest Ref Range: 70-99 mg/dL 100 (H)  Anion gap Latest Ref Range: 5-15  8  Alkaline Phosphatase Latest Ref Range: 39-117 U/L 123 (H)  Albumin Latest Ref Range: 3.5-5.2 g/dL 3.6  AST Latest Ref Range: 0-37 U/L 20  ALT Latest Ref Range: 0-35 U/L 28  Total Protein Latest Ref Range: 6.0-8.3 g/dL 7.4  Total Bilirubin Latest Ref Range: 0.3-1.2 mg/dL 0.4  LDH Latest Ref Range: 94-250 U/L 137  Beta-2 Microglobulin Latest Ref Range: 0.6-2.4 mg/L 3.8 (H)  CRP Latest Ref Range: <0.60 mg/dL 0.9 (H)  Kappa free light  chain Latest Ref Range: 3.30-19.40 mg/L 88.08 (H)  Lamda free light chains Latest Ref Range: 5.71-26.30 mg/L 25.27  Kappa, lamda light chain ratio Latest Ref Range: 0.26-1.65  3.49 (H)  WBC Latest Ref Range: 4.0-10.5 K/uL 9.3  RBC Latest Ref Range: 3.87-5.11 MIL/uL 4.29  Hemoglobin Latest Ref Range: 12.0-15.0 g/dL 13.3  HCT Latest Ref Range: 36.0-46.0 % 42.1  MCV Latest Ref Range: 78.0-100.0 fL 98.1  MCH Latest Ref Range: 26.0-34.0 pg 31.0  MCHC Latest Ref Range: 30.0-36.0 g/dL 31.6  RDW Latest Ref Range: 11.5-15.5 % 12.8  Platelets Latest Ref Range: 150-400 K/uL 419 (H)  Neutrophils Latest Ref Range: 43-77 % 68  Lymphocytes Latest Ref Range: 12-46 % 23  Monocytes Relative Latest Ref Range: 3-12 % 5  Eosinophil Latest Ref Range: 0-5 % 3  Basophil Latest Ref Range: 0-1 % 1  NEUT# Latest Ref Range: 1.7-7.7 K/uL 6.4  Lymphocyte # Latest Ref Range: 0.7-4.0 K/uL 2.2  Monocyte # Latest Ref Range: 0.1-1.0 K/uL 0.5  Eosinophils Absolute Latest Ref Range: 0.0-0.7 K/uL 0.2  Basophils Absolute Latest Ref Range: 0.0-0.1 K/uL 0.1    PATHOLOGY: Diagnosis Bone Marrow, Aspirate,Biopsy, and Clot, right iliac - NORMOCELLULAR MARROW WITH MILD POLYTYPIC PLASMACYTOSIS (5%). - SEE COMMENT. PERIPHERAL BLOOD: - UNREMARKABLE. Diagnosis Note The bone marrow is overall normocellular. There is a mild plasmacytosis (approximately 5%) which is polytypic by light chain immunohistochemistry. These findings are non-specific and are not diagnostic for a plasma cell neoplasm. Vicente Males MD Pathologist, Electronic Signature (Case signed 03/03/2014) GROSS AND MICROSCOPIC INFORMATION Specimen Clinical Information Evaluate for multiple myeloma, MGUS (je) Source Bone Marrow, Aspirate,Biopsy, and Clot, right iliac Microscopic LAB DATA: CBC performed on 02/27/2014 shows: WBC 8.6 K/ul Neutrophils 68% HB 12.3 g/dl Lymphocytes 24% HCT 37.7 % Monocytes 3% MCV 97.2 fL Eosinophils 5% RDW 13.7 % Basophils 0% PLT  359 K/ul 1 of 3 FINAL for Tift, Lavene ([id number]) Microscopic(continued) PERIPHERAL BLOOD SMEAR: Red blood cell morphology is relatively unremarkable. There is no rouleaux formation. Leukocytes are present in normal numbers with unremarkable morphology. There are no circulating plasma cells. Platelets are present in normal numbers. BONE MARROW ASPIRATE: Spicular and cellular. Erythroid precursors: Mild relative decrease. Orderly maturation without significant dysplasia. Granulocytic precursors: Mild relative increased. Orderly maturation without significant dysplasia. No increase in blasts. Megakaryocytes: Quantitatively and qualitatively unremarkable. Lymphocytes/plasma cells: There is a mild increase in plasma cells (8% by manual differential counts). Lymphocytes are not increased. TOUCH PREPARATIONS: Similar to aspirate smears. CLOT and BIOPSY: The core biopsy is overall normocellular for age (40%). There are no large sheets or clusters of plasma cells. Immunohistochemistry for CD138 (block 1A and 1B) reveal scattered and perivascular plasma cells (approximately 5%). By immunohistochemistry (block 1A and 1B) the plasma cells are polytypic for kappa and lambda. There is trilineage hematopoiesis with maturation. Myeloid elements are slightly increased relative to lymphoid elements. Megakaryocytes displasy a spectrum of maturation without clustering. There are a few benign appearing lymphoid aggregates. The clot section has similar findings. IRON STAIN: Iron stains are performed on a bone marrow aspirate smear and section of clot. The controls stained appropriately. Storage Iron: Present. Ringed Sideroblasts: Absent. ADDITIONAL DATA / TESTING: Cytogenetic studies were ordered and will be reported separately. Specimen Table Bone Marrow count performed on 500 cells shows: Blasts: 3% Myeloid 59% Promyelocyts: 4% Myelocytes: 9% Erythroid 13% Metamyelocyts: 3% Bands: 14% Lymphocytes:  18% Neutrophils: 21% Eosinophils: 5% Plasma Cells: 8% Basophils: 0% Monocytes: 2% M:E ratio: 4.64 Gross The specimen is received in [employer] and consists of a 5.0 x 5.0 x 2.0 mm aggregate of red brown clotted blood. The specimen is entirely submitted in cassette 1A. The specimen is received in [employer]in[employer] and consists of a 1.7 cm in length x 0.2 cm in diameter core of tan brown bone. The specimen is entirely submitted in cassette 1B following decalcification. (KL:kh 02-27-14) 2 of 3 FINAL for SAQUOIA, SIANEZ [phone]) Stain(s) Used in Diagnosis The following stain(s) were used in diagnosing the case: KAPPA, LAMBDA, CD 138, CD 3, CD 20, Iron Stain. The control(s) stained appropriately. Report signed out from following location(s) Technical Component performed at [facility] [address],[place] [address].[id number]., Technical Component performed at [address], [place], [place] [address]. CLIA #: [id number], Interpretation performed at [place], [place], [place] [address]. CLIA #: [id number], 3 of    ASSESSMENT and THERAPY PLAN:  IgG kappa MGUS CKD BMBX 02/2014  I discussed with the patient that given her bone marrow biopsy results she does not have multiple myeloma.  I recommended ongoing observation. She follows closely with her primary care physician for depression and other chronic medical issues. She is overdue for a myeloma survey and does agree to have one. We will order this today. We will continue with ongoing observation. If she has interim concerns, questions or problems I advised her to call the clinic. Her chronic kidney disease is most likely from her long-standing hypertension.   All questions were answered. The patient knows to call the clinic with any problems, questions or concerns. We can certainly see the patient much sooner if necessary. This note was  electronically signed. I have reviewed the above documentation for accuracy and completeness and I agree with the above.  This document serves as a record of services personally performed by Ancil Linsey, MD. It was created on her behalf by Pearlie Oyster, a trained medical scribe. The creation of this record is based on the scribe's personal observations and the provider's statements to them. This document has been checked and approved by the attending provider.    Molli Hazard, MD

## 2015-03-28 LAB — C-REACTIVE PROTEIN: CRP: 0.9 mg/dL — ABNORMAL HIGH (ref <0.60)

## 2015-03-29 LAB — KAPPA/LAMBDA LIGHT CHAINS
Kappa free light chain: 88.08 mg/L — ABNORMAL HIGH (ref 3.30–19.40)
Kappa, lambda light chain ratio: 3.49 — ABNORMAL HIGH (ref 0.26–1.65)
Lambda free light chains: 25.27 mg/L (ref 5.71–26.30)

## 2015-03-30 LAB — BETA 2 MICROGLOBULIN, SERUM: Beta-2 Microglobulin: 3.8 mg/L — ABNORMAL HIGH (ref 0.6–2.4)

## 2015-03-30 NOTE — Progress Notes (Signed)
Labs drawn

## 2015-03-31 ENCOUNTER — Other Ambulatory Visit: Payer: Self-pay | Admitting: Nurse Practitioner

## 2015-03-31 LAB — MULTIPLE MYELOMA PANEL, SERUM
Albumin SerPl Elph-Mcnc: 3.3 g/dL (ref 3.2–5.6)
Albumin/Glob SerPl: 1 (ref 0.7–2.0)
Alpha 1: 0.2 g/dL (ref 0.1–0.4)
Alpha2 Glob SerPl Elph-Mcnc: 0.8 g/dL (ref 0.4–1.2)
B-Globulin SerPl Elph-Mcnc: 1.1 g/dL (ref 0.6–1.3)
Gamma Glob SerPl Elph-Mcnc: 1.5 g/dL (ref 0.5–1.6)
Globulin, Total: 3.6 g/dL (ref 2.0–4.5)
IgA: 300 mg/dL (ref 87–352)
IgG (Immunoglobin G), Serum: 1368 mg/dL (ref 700–1600)
IgM, Serum: 225 mg/dL — ABNORMAL HIGH (ref 26–217)
M Protein SerPl Elph-Mcnc: 0.5 g/dL — ABNORMAL HIGH
Total Protein ELP: 6.9 g/dL (ref 6.0–8.5)

## 2015-04-03 DIAGNOSIS — I129 Hypertensive chronic kidney disease with stage 1 through stage 4 chronic kidney disease, or unspecified chronic kidney disease: Secondary | ICD-10-CM | POA: Diagnosis not present

## 2015-04-03 DIAGNOSIS — F319 Bipolar disorder, unspecified: Secondary | ICD-10-CM | POA: Diagnosis not present

## 2015-04-03 DIAGNOSIS — N183 Chronic kidney disease, stage 3 (moderate): Secondary | ICD-10-CM | POA: Diagnosis not present

## 2015-04-03 DIAGNOSIS — G40209 Localization-related (focal) (partial) symptomatic epilepsy and epileptic syndromes with complex partial seizures, not intractable, without status epilepticus: Secondary | ICD-10-CM | POA: Diagnosis not present

## 2015-04-07 ENCOUNTER — Other Ambulatory Visit: Payer: Self-pay | Admitting: Nurse Practitioner

## 2015-04-07 DIAGNOSIS — F319 Bipolar disorder, unspecified: Secondary | ICD-10-CM | POA: Diagnosis not present

## 2015-04-07 DIAGNOSIS — N183 Chronic kidney disease, stage 3 (moderate): Secondary | ICD-10-CM | POA: Diagnosis not present

## 2015-04-07 DIAGNOSIS — I129 Hypertensive chronic kidney disease with stage 1 through stage 4 chronic kidney disease, or unspecified chronic kidney disease: Secondary | ICD-10-CM | POA: Diagnosis not present

## 2015-04-07 DIAGNOSIS — G40209 Localization-related (focal) (partial) symptomatic epilepsy and epileptic syndromes with complex partial seizures, not intractable, without status epilepticus: Secondary | ICD-10-CM | POA: Diagnosis not present

## 2015-04-07 MED ORDER — LORAZEPAM 1 MG PO TABS: 1.0000 mg | ORAL_TABLET | Freq: Two times a day (BID) | ORAL | Status: DC

## 2015-04-09 ENCOUNTER — Telehealth: Payer: Self-pay | Admitting: Nurse Practitioner

## 2015-04-10 ENCOUNTER — Ambulatory Visit (INDEPENDENT_AMBULATORY_CARE_PROVIDER_SITE_OTHER): Payer: Medicare Other | Admitting: Family Medicine

## 2015-04-10 ENCOUNTER — Inpatient Hospital Stay (HOSPITAL_COMMUNITY)
Admission: EM | Admit: 2015-04-10 | Discharge: 2015-04-13 | DRG: 682 | Disposition: A | Discharge status: Home Health | Payer: Medicare Other | Attending: Internal Medicine | Admitting: Internal Medicine

## 2015-04-10 ENCOUNTER — Encounter (HOSPITAL_COMMUNITY): Payer: Self-pay | Admitting: *Deleted

## 2015-04-10 ENCOUNTER — Encounter: Payer: Self-pay | Admitting: Family Medicine

## 2015-04-10 VITALS — BP 110/80 | HR 100 | Temp 96.6°F | Ht 66.0 in | Wt 218.0 lb

## 2015-04-10 DIAGNOSIS — G629 Polyneuropathy, unspecified: Secondary | ICD-10-CM | POA: Diagnosis not present

## 2015-04-10 DIAGNOSIS — E860 Dehydration: Secondary | ICD-10-CM | POA: Diagnosis not present

## 2015-04-10 DIAGNOSIS — R634 Abnormal weight loss: Secondary | ICD-10-CM | POA: Diagnosis not present

## 2015-04-10 DIAGNOSIS — R531 Weakness: Secondary | ICD-10-CM

## 2015-04-10 DIAGNOSIS — Z7982 Long term (current) use of aspirin: Secondary | ICD-10-CM

## 2015-04-10 DIAGNOSIS — K219 Gastro-esophageal reflux disease without esophagitis: Secondary | ICD-10-CM | POA: Diagnosis present

## 2015-04-10 DIAGNOSIS — Z87891 Personal history of nicotine dependence: Secondary | ICD-10-CM | POA: Diagnosis not present

## 2015-04-10 DIAGNOSIS — E872 Acidosis, unspecified: Secondary | ICD-10-CM | POA: Diagnosis present

## 2015-04-10 DIAGNOSIS — R1084 Generalized abdominal pain: Secondary | ICD-10-CM | POA: Diagnosis not present

## 2015-04-10 DIAGNOSIS — Z6834 Body mass index (BMI) 34.0-34.9, adult: Secondary | ICD-10-CM | POA: Diagnosis not present

## 2015-04-10 DIAGNOSIS — I129 Hypertensive chronic kidney disease with stage 1 through stage 4 chronic kidney disease, or unspecified chronic kidney disease: Secondary | ICD-10-CM | POA: Diagnosis not present

## 2015-04-10 DIAGNOSIS — Z833 Family history of diabetes mellitus: Secondary | ICD-10-CM | POA: Diagnosis not present

## 2015-04-10 DIAGNOSIS — R000 Tachycardia, unspecified: Secondary | ICD-10-CM

## 2015-04-10 DIAGNOSIS — Z8249 Family history of ischemic heart disease and other diseases of the circulatory system: Secondary | ICD-10-CM

## 2015-04-10 DIAGNOSIS — N289 Disorder of kidney and ureter, unspecified: Secondary | ICD-10-CM

## 2015-04-10 DIAGNOSIS — R109 Unspecified abdominal pain: Secondary | ICD-10-CM

## 2015-04-10 DIAGNOSIS — R112 Nausea with vomiting, unspecified: Secondary | ICD-10-CM

## 2015-04-10 DIAGNOSIS — R7989 Other specified abnormal findings of blood chemistry: Secondary | ICD-10-CM

## 2015-04-10 DIAGNOSIS — N184 Chronic kidney disease, stage 4 (severe): Secondary | ICD-10-CM

## 2015-04-10 DIAGNOSIS — R197 Diarrhea, unspecified: Secondary | ICD-10-CM | POA: Diagnosis present

## 2015-04-10 DIAGNOSIS — N179 Acute kidney failure, unspecified: Principal | ICD-10-CM | POA: Diagnosis present

## 2015-04-10 DIAGNOSIS — E785 Hyperlipidemia, unspecified: Secondary | ICD-10-CM | POA: Diagnosis present

## 2015-04-10 DIAGNOSIS — J449 Chronic obstructive pulmonary disease, unspecified: Secondary | ICD-10-CM | POA: Diagnosis present

## 2015-04-10 DIAGNOSIS — A09 Infectious gastroenteritis and colitis, unspecified: Secondary | ICD-10-CM | POA: Diagnosis not present

## 2015-04-10 DIAGNOSIS — J45909 Unspecified asthma, uncomplicated: Secondary | ICD-10-CM | POA: Diagnosis present

## 2015-04-10 DIAGNOSIS — E43 Unspecified severe protein-calorie malnutrition: Secondary | ICD-10-CM | POA: Diagnosis present

## 2015-04-10 DIAGNOSIS — A084 Viral intestinal infection, unspecified: Secondary | ICD-10-CM

## 2015-04-10 DIAGNOSIS — M810 Age-related osteoporosis without current pathological fracture: Secondary | ICD-10-CM | POA: Diagnosis not present

## 2015-04-10 DIAGNOSIS — R748 Abnormal levels of other serum enzymes: Secondary | ICD-10-CM

## 2015-04-10 DIAGNOSIS — R1110 Vomiting, unspecified: Secondary | ICD-10-CM | POA: Diagnosis present

## 2015-04-10 LAB — I-STAT CHEM 8, ED
BUN: 26 mg/dL — ABNORMAL HIGH (ref 6–20)
Calcium, Ion: 0.82 mmol/L — ABNORMAL LOW (ref 1.13–1.30)
Chloride: 120 mmol/L — ABNORMAL HIGH (ref 101–111)
Creatinine, Ser: 1.4 mg/dL — ABNORMAL HIGH (ref 0.44–1.00)
Glucose, Bld: 66 mg/dL (ref 65–99)
HCT: 27 % — ABNORMAL LOW (ref 36.0–46.0)
Hemoglobin: 9.2 g/dL — ABNORMAL LOW (ref 12.0–15.0)
Potassium: 2.8 mmol/L — ABNORMAL LOW (ref 3.5–5.1)
Sodium: 149 mmol/L — ABNORMAL HIGH (ref 135–145)
TCO2: 10 mmol/L (ref 0–100)

## 2015-04-10 LAB — POCT CBC
Granulocyte percent: 77.4 %G (ref 37–80)
HCT, POC: 47.9 % (ref 37.7–47.9)
Hemoglobin: 14.4 g/dL (ref 12.2–16.2)
Lymph, poc: 1.6 (ref 0.6–3.4)
MCH, POC: 28.8 pg (ref 27–31.2)
MCHC: 30.1 g/dL — AB (ref 31.8–35.4)
MCV: 95.8 fL (ref 80–97)
MPV: 8.2 fL (ref 0–99.8)
POC Granulocyte: 7.2 — AB (ref 2–6.9)
POC LYMPH PERCENT: 17.6 %L (ref 10–50)
Platelet Count, POC: 438 10*3/uL — AB (ref 142–424)
RBC: 5 M/uL (ref 4.04–5.48)
RDW, POC: 13.4 %
WBC: 9.3 10*3/uL (ref 4.6–10.2)

## 2015-04-10 LAB — CBC WITH DIFFERENTIAL/PLATELET
Basophils Absolute: 0.1 10*3/uL (ref 0.0–0.1)
Basophils Absolute: 0.1 10*3/uL (ref 0.0–0.1)
Basophils Relative: 1 % (ref 0–1)
Basophils Relative: 1 % (ref 0–1)
Eosinophils Absolute: 0.1 10*3/uL (ref 0.0–0.7)
Eosinophils Absolute: 0.1 10*3/uL (ref 0.0–0.7)
Eosinophils Relative: 1 % (ref 0–5)
Eosinophils Relative: 1 % (ref 0–5)
HCT: 48.5 % — ABNORMAL HIGH (ref 36.0–46.0)
HCT: 40.9 % (ref 36.0–46.0)
Hemoglobin: 15.9 g/dL — ABNORMAL HIGH (ref 12.0–15.0)
Hemoglobin: 13.2 g/dL (ref 12.0–15.0)
Lymphocytes Relative: 21 % (ref 12–46)
Lymphocytes Relative: 24 % (ref 12–46)
Lymphs Abs: 2.2 10*3/uL (ref 0.7–4.0)
Lymphs Abs: 2.2 10*3/uL (ref 0.7–4.0)
MCH: 32.2 pg (ref 26.0–34.0)
MCH: 31.3 pg (ref 26.0–34.0)
MCHC: 32.8 g/dL (ref 30.0–36.0)
MCHC: 32.3 g/dL (ref 30.0–36.0)
MCV: 98.2 fL (ref 78.0–100.0)
MCV: 96.9 fL (ref 78.0–100.0)
Monocytes Absolute: 0.5 10*3/uL (ref 0.1–1.0)
Monocytes Absolute: 0.6 10*3/uL (ref 0.1–1.0)
Monocytes Relative: 5 % (ref 3–12)
Monocytes Relative: 7 % (ref 3–12)
Neutro Abs: 7.6 10*3/uL (ref 1.7–7.7)
Neutro Abs: 6.1 10*3/uL (ref 1.7–7.7)
Neutrophils Relative %: 72 % (ref 43–77)
Neutrophils Relative %: 67 % (ref 43–77)
Platelets: 386 10*3/uL (ref 150–400)
Platelets: 383 10*3/uL (ref 150–400)
RBC: 4.94 MIL/uL (ref 3.87–5.11)
RBC: 4.22 MIL/uL (ref 3.87–5.11)
RDW: 13.3 % (ref 11.5–15.5)
RDW: 13.3 % (ref 11.5–15.5)
WBC: 10.4 10*3/uL (ref 4.0–10.5)
WBC: 9.1 10*3/uL (ref 4.0–10.5)

## 2015-04-10 LAB — COMPREHENSIVE METABOLIC PANEL
ALT: 45 U/L (ref 14–54)
AST: 40 U/L (ref 15–41)
Albumin: 4 g/dL (ref 3.5–5.0)
Alkaline Phosphatase: 134 U/L — ABNORMAL HIGH (ref 38–126)
Anion gap: 12 (ref 5–15)
BUN: 49 mg/dL — ABNORMAL HIGH (ref 6–20)
CO2: 14 mmol/L — ABNORMAL LOW (ref 22–32)
Calcium: 10.1 mg/dL (ref 8.9–10.3)
Chloride: 110 mmol/L (ref 101–111)
Creatinine, Ser: 2.78 mg/dL — ABNORMAL HIGH (ref 0.44–1.00)
GFR calc Af Amer: 20 mL/min — ABNORMAL LOW (ref >60)
GFR calc non Af Amer: 17 mL/min — ABNORMAL LOW (ref >60)
Glucose, Bld: 102 mg/dL — ABNORMAL HIGH (ref 65–99)
Potassium: 5.8 mmol/L — ABNORMAL HIGH (ref 3.5–5.1)
Sodium: 136 mmol/L (ref 135–145)
Total Bilirubin: 0.8 mg/dL (ref 0.3–1.2)
Total Protein: 8.2 g/dL — ABNORMAL HIGH (ref 6.5–8.1)

## 2015-04-10 LAB — BASIC METABOLIC PANEL
Anion gap: 9 (ref 5–15)
BUN: 48 mg/dL — ABNORMAL HIGH (ref 6–20)
CO2: 17 mmol/L — ABNORMAL LOW (ref 22–32)
Calcium: 8.9 mg/dL (ref 8.9–10.3)
Chloride: 110 mmol/L (ref 101–111)
Creatinine, Ser: 2.39 mg/dL — ABNORMAL HIGH (ref 0.44–1.00)
GFR calc Af Amer: 24 mL/min — ABNORMAL LOW (ref >60)
GFR calc non Af Amer: 20 mL/min — ABNORMAL LOW (ref >60)
Glucose, Bld: 108 mg/dL — ABNORMAL HIGH (ref 65–99)
Potassium: 5.1 mmol/L (ref 3.5–5.1)
Sodium: 136 mmol/L (ref 135–145)

## 2015-04-10 LAB — I-STAT CG4 LACTIC ACID, ED: Lactic Acid, Venous: 0.58 mmol/L (ref 0.5–2.0)

## 2015-04-10 LAB — LIPASE, BLOOD: Lipase: 37 U/L (ref 22–51)

## 2015-04-10 MED ORDER — ONDANSETRON HCL 4 MG/2ML IJ SOLN
4.0000 mg | Freq: Once | INTRAMUSCULAR | Status: AC
  Administered 2015-04-10: 4 mg via INTRAVENOUS
  Filled 2015-04-10: qty 2

## 2015-04-10 MED ORDER — SODIUM CHLORIDE 0.9 % IV SOLN: INTRAVENOUS | Status: DC

## 2015-04-10 MED ORDER — SODIUM CHLORIDE 0.9 % IV BOLUS (SEPSIS)
500.0000 mL | Freq: Once | INTRAVENOUS | Status: AC
  Administered 2015-04-10: 500 mL via INTRAVENOUS

## 2015-04-10 NOTE — ED Notes (Addendum)
Vomiting and diarrhea, sent by Dr Laurance Flatten.  Family reports 20 lb wt loss

## 2015-04-10 NOTE — Progress Notes (Signed)
Subjective:    Patient ID: [name], female    DOB: 03/23/1951, 64 y.o.   MRN: [id number]  HPI Patient here today for nausea, vomiting and diarrhea that has been off and on for 10 days. She is not seeing any bright red blood in the stool but she has had some dark bowel movements and says that she is on iron. She is having 4 loose stools daily and she continues to have vomiting and increasing weakness. She denies shortness of breath or voiding symptoms. She also indicates that over the past couple weeks that she has lost 20 pounds of weight and she has not had any changes in her medications. She was hospitalized recently for an increase potassium and this was around the end of April when she passed out and had to be hospitalized on an emergency basis in [place]. She comes to the office today being brought by her son. She has chronic kidney disease and is followed by Dr. Hinda Lenis     Patient Active Problem List   Diagnosis Date Noted  . Gastroesophageal reflux disease without esophagitis 03/13/2015  . Neuropathy 03/13/2015  . 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 Insomnia 03/25/2013  . Depression 03/25/2013  . Seizures 03/25/2013  . Hyperlipidemia 03/25/2013  . 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 Essential hypertension 04/21/2010  . DOE (dyspnea on exertion) 04/21/2010  . CHEST PAIN-UNSPECIFIED 04/21/2010   Outpatient Encounter Prescriptions as of 04/10/2015  Medication Sig  . amLODipine (NORVASC) 5 MG tablet Take 1 tablet (5 mg total) by mouth daily.  [name] aspirin EC 81 MG tablet Take 81 mg by mouth daily.  . calcium acetate (PHOSLO) 667 MG capsule Take 1 capsule by mouth 3 (three) times daily.  . cholecalciferol (VITAMIN D) 1000 UNITS tablet Take 1,000 Units by mouth daily.  . cloNIDine (CATAPRES - DOSED IN MG/24 HR) 0.3 mg/24hr patch Place 1 patch (0.3 mg total) onto the skin once a week.  . DULoxetine (CYMBALTA) 30 MG capsule Take 1 capsule (30  mg total) by mouth every morning.  . DULoxetine (CYMBALTA) 60 MG capsule Take 1 capsule (60 mg total) by mouth at bedtime.  . ferrous sulfate 325 (65 FE) MG tablet Take 325 mg by mouth daily with breakfast.  . gabapentin (NEURONTIN) 300 MG capsule Take 1 capsule (300 mg total) by mouth 3 (three) times daily.  . Ipratropium-Albuterol (COMBIVENT RESPIMAT) 20-100 MCG/ACT AERS respimat 2 puffs twice a day  . levETIRAcetam (KEPPRA) 500 MG tablet TAKE 1 TABLET BY MOUTH TWICE DAILY  . lidocaine (XYLOCAINE) 4 % external solution Apply topically as needed.   [name] LORazepam (ATIVAN) 1 MG tablet Take 1 tablet (1 mg total) by mouth 2 (two) times daily.  [name] losartan (COZAAR) 25 MG tablet Take 1 tablet (25 mg total) by mouth daily.  . meloxicam (MOBIC) 15 MG tablet Take 1 tablet (15 mg total) by mouth daily.  . metoprolol (LOPRESSOR) 50 MG tablet Take 1 tablet (50 mg total) by mouth 2 (two) times daily.  . pantoprazole (PROTONIX) 40 MG tablet Take 1 tablet (40 mg total) by mouth daily.  . QUEtiapine (SEROQUEL) 50 MG tablet Take 50 mg by mouth at bedtime.  . simvastatin (ZOCOR) 10 MG tablet TAKE 1 TABLET BY MOUTH ONCE DAILY  . zolpidem (AMBIEN) 10 MG tablet TAKE 1 TABLET BY MOUTH ONCE DAILY AT BEDTIME AS NEEDED FOR SLEEP  . [DISCONTINUED] calcitRIOL (ROCALTROL) 0.5 MCG capsule Take 1 capsule by mouth daily.  No facility-administered encounter medications on file as of 04/10/2015.      Review of Systems  Constitutional: Positive for fatigue.  HENT: Negative.   Eyes: Negative.   Respiratory: Negative.   Cardiovascular: Negative.   Gastrointestinal: Positive for nausea (some abd cramping), vomiting and diarrhea.  Endocrine: Negative.   Genitourinary: Negative.   Musculoskeletal: Negative.   Skin: Negative.   Allergic/Immunologic: Negative.   Neurological: Positive for weakness.  Hematological: Negative.   Psychiatric/Behavioral: Negative.        Objective:   Physical Exam  Constitutional: She is  oriented to person, place, and time. She appears distressed.  HENT:  Head: Normocephalic and atraumatic.  Right Ear: External ear normal.  Left Ear: External ear normal.  Nose: Nose normal.  Mouth/Throat: Oropharynx is clear and moist. No oropharyngeal exudate.  Appears well hydrated  Eyes: Conjunctivae and EOM are normal. Pupils are equal, round, and reactive to light. Right eye exhibits no discharge. Left eye exhibits no discharge. No scleral icterus.  Neck: Normal range of motion. Neck supple. No thyromegaly present.  No anterior cervical nodes  Cardiovascular: Regular rhythm and normal heart sounds.   No murmur heard. At 120/m  Pulmonary/Chest: Effort normal and breath sounds normal. No respiratory distress. She has no wheezes. She has no rales. She exhibits no tenderness.  Abdominal: Soft. Bowel sounds are normal. She exhibits no mass. There is tenderness. There is no rebound and no guarding.  Generalized abdominal tenderness  Musculoskeletal: Normal range of motion. She exhibits no edema or tenderness.  Lymphadenopathy:    She has no cervical adenopathy.  Neurological: She is alert and oriented to person, place, and time.  Skin: Skin is warm and dry. No rash noted. No erythema. No pallor.  Psychiatric: She has a normal mood and affect. Her behavior is normal. Judgment and thought content normal.  The affect is somewhat flat  Nursing note and vitals reviewed.  BP 110/80 mmHg  Pulse 100  Temp(Src) 96.6 F (35.9 C) (Oral)  Ht _0  (1.676 m)  Wt 218 lb (98.884 kg)  BMI 35.20 kg/m2  Results for orders placed or performed in visit on 04/10/15  POCT CBC  Result Value Ref Range   WBC 9.3 4.6 - 10.2 K/uL   Lymph, poc 1.6 0.6 - 3.4   POC LYMPH PERCENT 17.6 10 - 50 %L   MID (cbc)  0 - 0.9   POC MID %  0 - 12 %M   POC Granulocyte 7.2 (A) 2 - 6.9   Granulocyte percent 77.4 37 - 80 %G   RBC 5.00 4.04 - 5.48 M/uL   Hemoglobin 14.4 12.2 - 16.2 g/dL   HCT, POC 47.9 37.7 - 47.9 %     MCV 95.8 80 - 97 fL   MCH, POC 28.8 27 - 31.2 pg   MCHC 30.1 (A) 31.8 - 35.4 g/dL   RDW, POC 13.4 %   Platelet Count, POC 438 (A) 142 - 424 K/uL   MPV 8.2 0 - 99.8 fL         Assessment & Plan:  1. Nausea and vomiting, vomiting of unspecified type -This is been going on for approximately 10 days with not a lot of relief and continuing to have both vomiting and diarrhea. - POCT CBC - BMP8+EGFR - Hepatic function panel  2. Diarrhea -This is an ongoing problem in light of the chronic kidney disease and elevated creatinine that was done the end of April - POCT CBC - BMP8+EGFR -  Hepatic function panel  3. Elevated serum creatinine -The last creatinine done the end of April was 2.16.  4. Chronic kidney disease (CKD), stage IV (severe) -Last GFR was 27  5. Tachycardia with 100 - 120 beats per minute  6. Generalized abdominal pain -The patient has no local tenderness it seems to be epigastric. Umbilical and left lower quadrant predominantly  7. Weakness generalized  8. Viral gastroenteritis -Because of this persistence of vomiting diarrhea and chronic kidney disease we will try to get her admitted to the hospital to monitor her electrolytes more closely and rehydrate her as well as following kidney function in the process  Patient Instructions  Because of the persistent diarrhea and vomiting and abdominal pain and a history of chronic kidney disease we will try to get you admitted to the hospital for fluids and to rest your intestinal tract and monitor your electrolytes at the same time.   Arrie Senate MD

## 2015-04-10 NOTE — ED Provider Notes (Signed)
CSN: [id number]     Arrival date & time 04/10/15  1454 History   First MD Initiated Contact with Patient 04/10/15 1731     Chief Complaint  Patient presents with  . Emesis     (Consider location/radiation/quality/duration/timing/severity/associated sxs/prior Treatment) HPI   [name] is a 64 y.o. female who presents for evaluation of ongoing nausea, vomiting, diarrhea, for 10 days. During this time. She has lost 20 pounds. No known sick contacts, or abnormal food ingestions. She saw her PCP today, who did some preliminary blood work, and suggested that she come here to be evaluated for admission. The patient has history of renal insufficiency and some type of electrolyte disturbance. Patient denies fever, cough, chest pain, back pain or headache. She is apparently taking her usual medications. There are no other known modifying factors.   Past Medical History  Diagnosis Date  . Seizures   . Chronic headaches   . Hypertension   . Osteoporosis   . Depression   . 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 URI (upper respiratory infection) 08/2014    early October  . Renal insufficiency    Past Surgical History  Procedure Laterality Date  . Breast reduction surgery    . Abdominal hysterectomy    . Lung lobectomy      rt lung-cyst   . Appendectomy    . Cholecystectomy    . Back surgery  2002   Family History  Problem Relation Age of Onset  . Hypertension Mother   . Heart disease Mother   . Diabetes Mother   . Kidney disease Mother   . Arthritis Mother   . Hypertension Father    History  Substance Use Topics  . Smoking status: Former Smoker -- 1.00 packs/day for 30 years    Types: Cigarettes    Quit date: 11/13/2012  . Smokeless tobacco: Never Used  . Alcohol Use: No   OB History    No data available     Review of Systems  All other systems reviewed and are negative.     Allergies  Asa; Penicillins; Sulfonamide derivatives;  and Tylenol with codeine #3  Home Medications   Prior to Admission medications   Medication Sig Start Date End Date Taking? Authorizing Provider  amLODipine (NORVASC) 5 MG tablet Take 1 tablet (5 mg total) by mouth daily. 03/13/15 03/12/16 Yes Mary-Margaret Hassell Done, FNP  aspirin EC 81 MG tablet Take 81 mg by mouth daily.   Yes Historical Provider, MD  calcium acetate (PHOSLO) 667 MG capsule Take 1 capsule by mouth 3 (three) times daily. 03/12/15  Yes Historical Provider, MD  cholecalciferol (VITAMIN D) 1000 UNITS tablet Take 1,000 Units by mouth daily.   Yes Historical Provider, MD  cloNIDine (CATAPRES - DOSED IN MG/24 HR) 0.3 mg/24hr patch Place 1 patch (0.3 mg total) onto the skin once a week. 03/13/15  Yes Mary-Margaret Hassell Done, FNP  DULoxetine (CYMBALTA) 30 MG capsule Take 1 capsule (30 mg total) by mouth every morning. 03/13/15  Yes Mary-Margaret Hassell Done, FNP  DULoxetine (CYMBALTA) 60 MG capsule Take 1 capsule (60 mg total) by mouth at bedtime. 03/13/15  Yes Mary-Margaret Hassell Done, FNP  ferrous sulfate 325 (65 FE) MG tablet Take 325 mg by mouth daily with breakfast.   Yes Historical Provider, MD  gabapentin (NEURONTIN) 300 MG capsule Take 1 capsule (300 mg total) by mouth 3 (three) times daily. 03/13/15  Yes Mary-Margaret Hassell Done, FNP  Ipratropium-Albuterol (COMBIVENT RESPIMAT) 20-100 MCG/ACT AERS respimat 2  puffs twice a day Patient taking differently: Inhale 2 puffs into the lungs 2 (two) times daily.  05/09/14  Yes Lysbeth Penner, FNP  levETIRAcetam (KEPPRA) 500 MG tablet TAKE 1 TABLET BY MOUTH TWICE DAILY 04/01/15  Yes Mary-Margaret Hassell Done, FNP  lidocaine (XYLOCAINE) 4 % external solution Apply topically as needed for mild pain or moderate pain (for headache pain).  05/08/14  Yes Historical Provider, MD  LORazepam (ATIVAN) 1 MG tablet Take 1 tablet (1 mg total) by mouth 2 (two) times daily. 04/07/15  Yes Mary-Margaret Hassell Done, FNP  losartan (COZAAR) 25 MG tablet Take 1 tablet (25 mg total) by mouth  daily. 03/13/15  Yes Mary-Margaret Hassell Done, FNP  meloxicam (MOBIC) 15 MG tablet Take 1 tablet (15 mg total) by mouth daily. 02/05/15  Yes Mary-Margaret Hassell Done, FNP  metoprolol (LOPRESSOR) 50 MG tablet Take 1 tablet (50 mg total) by mouth 2 (two) times daily. 03/13/15  Yes Mary-Margaret Hassell Done, FNP  pantoprazole (PROTONIX) 40 MG tablet Take 1 tablet (40 mg total) by mouth daily. 03/13/15  Yes Mary-Margaret Hassell Done, FNP  QUEtiapine (SEROQUEL) 50 MG tablet Take 50 mg by mouth at bedtime. 03/25/15 04/24/15 Yes Historical Provider, MD  simvastatin (ZOCOR) 10 MG tablet TAKE 1 TABLET BY MOUTH ONCE DAILY 08/13/14  Yes Lysbeth Penner, FNP  zolpidem (AMBIEN) 10 MG tablet TAKE 1 TABLET BY MOUTH ONCE DAILY AT BEDTIME AS NEEDED FOR SLEEP 03/13/15  Yes Mary-Margaret Hassell Done, FNP   BP 144/77 mmHg  Pulse 61  Temp(Src) 97.8 F (36.6 C) (Oral)  Resp 18  SpO2 98% Physical Exam  Constitutional: She is oriented to person, place, and time. She appears well-developed.  She appears older than stated age.  HENT:  Head: Normocephalic and atraumatic.  Right Ear: External ear normal.  Left Ear: External ear normal.  Eyes: Conjunctivae and EOM are normal. Pupils are equal, round, and reactive to light.  Neck: Normal range of motion and phonation normal. Neck supple.  Cardiovascular: Normal rate, regular rhythm and normal heart sounds.   Pulmonary/Chest: Effort normal and breath sounds normal. She exhibits no bony tenderness.  Abdominal: Soft. There is no tenderness.  Musculoskeletal: Normal range of motion.  Neurological: She is alert and oriented to person, place, and time. No cranial nerve deficit or sensory deficit. She exhibits normal muscle tone. Coordination normal.  Skin: Skin is warm, dry and intact.  Psychiatric: She has a normal mood and affect. Her behavior is normal. Judgment and thought content normal.  Nursing note and vitals reviewed.   ED Course  Procedures (including critical care time)  Medications   0.9 %  sodium chloride infusion (not administered)  amLODipine (NORVASC) tablet 5 mg (not administered)  aspirin EC tablet 81 mg (not administered)  calcium acetate (PHOSLO) capsule 667 mg (not administered)  cloNIDine (CATAPRES - Dosed in mg/24 hr) patch 0.3 mg (not administered)  DULoxetine (CYMBALTA) DR capsule 30 mg (not administered)  gabapentin (NEURONTIN) capsule 300 mg (not administered)  levETIRAcetam (KEPPRA) tablet 500 mg (not administered)  Ipratropium-Albuterol (COMBIVENT) respimat 2 puff (not administered)  pantoprazole (PROTONIX) EC tablet 40 mg (not administered)  QUEtiapine (SEROQUEL) tablet 50 mg (not administered)  simvastatin (ZOCOR) tablet 10 mg (not administered)  metoprolol (LOPRESSOR) tablet 50 mg (not administered)  losartan (COZAAR) tablet 25 mg (not administered)  sodium chloride 0.9 % bolus 500 mL (500 mLs Intravenous New Bag/Given 04/10/15 1848)  ondansetron (ZOFRAN) injection 4 mg (4 mg Intravenous Given 04/10/15 2316)    Patient Vitals for the past 24  hrs:  BP Temp Temp src Pulse Resp SpO2  04/10/15 2259 144/77 mmHg - - 61 18 98 %  04/10/15 2010 139/88 mmHg - - 107 18 95 %  04/10/15 1506 121/65 mmHg 97.8 F (36.6 C) Oral (!) 126 20 99 %   Repeat blood testing done, to verify initial hyperkalemia. Secondary testing with i-STAT technology was markedly different than the BMET. This necessitated a third blood draw to assess her metabolic status.   Nursing of phlebotomy were unable to get blood for repeat testing.  Right femoral vein, venipuncture, by me, with 18-gauge needle using aseptic technique- removed. 10 cc of blood, without complication.  12:02 AM Reevaluation with update and discussion. After initial assessment and treatment, an updated evaluation reveals she is tolerating some oral fluids, and is hungry. Repeat blood testing, is reassuring.Daleen Bo L    Labs Review Labs Reviewed  CBC WITH DIFFERENTIAL/PLATELET - Abnormal; Notable for  the following:    Hemoglobin 15.9 (*)    HCT 48.5 (*)    All other components within normal limits  COMPREHENSIVE METABOLIC PANEL - Abnormal; Notable for the following:    Potassium 5.8 (*)    CO2 14 (*)    Glucose, Bld 102 (*)    BUN 49 (*)    Creatinine, Ser 2.78 (*)    Total Protein 8.2 (*)    Alkaline Phosphatase 134 (*)    GFR calc non Af Amer 17 (*)    GFR calc Af Amer 20 (*)    All other components within normal limits  BASIC METABOLIC PANEL - Abnormal; Notable for the following:    CO2 17 (*)    Glucose, Bld 108 (*)    BUN 48 (*)    Creatinine, Ser 2.39 (*)    GFR calc non Af Amer 20 (*)    GFR calc Af Amer 24 (*)    All other components within normal limits  I-STAT CHEM 8, ED - Abnormal; Notable for the following:    Sodium 149 (*)    Potassium 2.8 (*)    Chloride 120 (*)    BUN 26 (*)    Creatinine, Ser 1.40 (*)    Calcium, Ion 0.82 (*)    Hemoglobin 9.2 (*)    HCT 27.0 (*)    All other components within normal limits  LIPASE, BLOOD  CBC WITH DIFFERENTIAL/PLATELET  I-STAT CG4 LACTIC ACID, ED  I-STAT CG4 LACTIC ACID, ED  I-STAT CHEM 8, ED  I-STAT CG4 LACTIC ACID, ED    Imaging Review No results found.   EKG Interpretation None      MDM   Final diagnoses:  Nausea vomiting and diarrhea  Weight loss  Renal insufficiency    Nonspecific, nausea, vomiting and diarrhea. Associated weight loss, is nonspecific. Prolonged ED evaluation required multiple blood draws to assess her true status. She remained hemodynamically stable. She was able to tolerate oral intake.  Nursing Notes Reviewed/ Care Coordinated, and agree without changes. Applicable Imaging Reviewed.  Interpretation of Laboratory Data incorporated into ED treatment  Plan: Admit   Daleen Bo, MD 04/11/15 [phone]

## 2015-04-10 NOTE — Patient Instructions (Signed)
Because of the persistent diarrhea and vomiting and abdominal pain and a history of chronic kidney disease we will try to get you admitted to the hospital for fluids and to rest your intestinal tract and monitor your electrolytes at the same time.

## 2015-04-10 NOTE — ED Notes (Signed)
Right femoral stick by Dr. Eulis Foster, labs drawn, sent to lab

## 2015-04-11 ENCOUNTER — Inpatient Hospital Stay (HOSPITAL_COMMUNITY): Payer: Medicare Other

## 2015-04-11 ENCOUNTER — Observation Stay (HOSPITAL_COMMUNITY): Payer: Medicare Other

## 2015-04-11 DIAGNOSIS — J45909 Unspecified asthma, uncomplicated: Secondary | ICD-10-CM | POA: Diagnosis present

## 2015-04-11 DIAGNOSIS — Z452 Encounter for adjustment and management of vascular access device: Secondary | ICD-10-CM | POA: Diagnosis not present

## 2015-04-11 DIAGNOSIS — E872 Acidosis, unspecified: Secondary | ICD-10-CM | POA: Diagnosis present

## 2015-04-11 DIAGNOSIS — J986 Disorders of diaphragm: Secondary | ICD-10-CM | POA: Diagnosis not present

## 2015-04-11 DIAGNOSIS — R197 Diarrhea, unspecified: Secondary | ICD-10-CM | POA: Diagnosis not present

## 2015-04-11 DIAGNOSIS — N179 Acute kidney failure, unspecified: Secondary | ICD-10-CM | POA: Diagnosis present

## 2015-04-11 DIAGNOSIS — J449 Chronic obstructive pulmonary disease, unspecified: Secondary | ICD-10-CM | POA: Diagnosis present

## 2015-04-11 DIAGNOSIS — M810 Age-related osteoporosis without current pathological fracture: Secondary | ICD-10-CM | POA: Diagnosis present

## 2015-04-11 DIAGNOSIS — Z8249 Family history of ischemic heart disease and other diseases of the circulatory system: Secondary | ICD-10-CM | POA: Diagnosis not present

## 2015-04-11 DIAGNOSIS — K219 Gastro-esophageal reflux disease without esophagitis: Secondary | ICD-10-CM | POA: Diagnosis present

## 2015-04-11 DIAGNOSIS — D7389 Other diseases of spleen: Secondary | ICD-10-CM | POA: Diagnosis not present

## 2015-04-11 DIAGNOSIS — Z833 Family history of diabetes mellitus: Secondary | ICD-10-CM | POA: Diagnosis not present

## 2015-04-11 DIAGNOSIS — Z6834 Body mass index (BMI) 34.0-34.9, adult: Secondary | ICD-10-CM | POA: Diagnosis not present

## 2015-04-11 DIAGNOSIS — I129 Hypertensive chronic kidney disease with stage 1 through stage 4 chronic kidney disease, or unspecified chronic kidney disease: Secondary | ICD-10-CM | POA: Diagnosis present

## 2015-04-11 DIAGNOSIS — E785 Hyperlipidemia, unspecified: Secondary | ICD-10-CM | POA: Diagnosis present

## 2015-04-11 DIAGNOSIS — D179 Benign lipomatous neoplasm, unspecified: Secondary | ICD-10-CM | POA: Diagnosis not present

## 2015-04-11 DIAGNOSIS — E43 Unspecified severe protein-calorie malnutrition: Secondary | ICD-10-CM | POA: Diagnosis present

## 2015-04-11 DIAGNOSIS — G629 Polyneuropathy, unspecified: Secondary | ICD-10-CM | POA: Diagnosis present

## 2015-04-11 DIAGNOSIS — N184 Chronic kidney disease, stage 4 (severe): Secondary | ICD-10-CM | POA: Diagnosis present

## 2015-04-11 DIAGNOSIS — R112 Nausea with vomiting, unspecified: Secondary | ICD-10-CM | POA: Diagnosis present

## 2015-04-11 DIAGNOSIS — A09 Infectious gastroenteritis and colitis, unspecified: Secondary | ICD-10-CM | POA: Diagnosis present

## 2015-04-11 DIAGNOSIS — Z7982 Long term (current) use of aspirin: Secondary | ICD-10-CM | POA: Diagnosis not present

## 2015-04-11 DIAGNOSIS — R1110 Vomiting, unspecified: Secondary | ICD-10-CM | POA: Diagnosis present

## 2015-04-11 DIAGNOSIS — Z87891 Personal history of nicotine dependence: Secondary | ICD-10-CM | POA: Diagnosis not present

## 2015-04-11 DIAGNOSIS — E860 Dehydration: Secondary | ICD-10-CM | POA: Diagnosis present

## 2015-04-11 LAB — COMPREHENSIVE METABOLIC PANEL
ALT: 31 U/L (ref 14–54)
AST: 20 U/L (ref 15–41)
Albumin: 3.4 g/dL — ABNORMAL LOW (ref 3.5–5.0)
Alkaline Phosphatase: 113 U/L (ref 38–126)
Anion gap: 7 (ref 5–15)
BUN: 42 mg/dL — ABNORMAL HIGH (ref 6–20)
CO2: 21 mmol/L — ABNORMAL LOW (ref 22–32)
Calcium: 8.9 mg/dL (ref 8.9–10.3)
Chloride: 109 mmol/L (ref 101–111)
Creatinine, Ser: 2 mg/dL — ABNORMAL HIGH (ref 0.44–1.00)
GFR calc Af Amer: 29 mL/min — ABNORMAL LOW (ref >60)
GFR calc non Af Amer: 25 mL/min — ABNORMAL LOW (ref >60)
Glucose, Bld: 135 mg/dL — ABNORMAL HIGH (ref 65–99)
Potassium: 4.6 mmol/L (ref 3.5–5.1)
Sodium: 137 mmol/L (ref 135–145)
Total Bilirubin: 0.6 mg/dL (ref 0.3–1.2)
Total Protein: 7 g/dL (ref 6.5–8.1)

## 2015-04-11 LAB — BMP8+EGFR
BUN/Creatinine Ratio: 21 (ref 11–26)
BUN: 46 mg/dL — ABNORMAL HIGH (ref 8–27)
CO2: 14 mmol/L — ABNORMAL LOW (ref 18–29)
Calcium: 10.1 mg/dL (ref 8.7–10.3)
Chloride: 104 mmol/L (ref 97–108)
Creatinine, Ser: 2.22 mg/dL — ABNORMAL HIGH (ref 0.57–1.00)
GFR calc Af Amer: 26 mL/min/{1.73_m2} — ABNORMAL LOW (ref >59)
GFR calc non Af Amer: 23 mL/min/{1.73_m2} — ABNORMAL LOW (ref >59)
Glucose: 129 mg/dL — ABNORMAL HIGH (ref 65–99)
Potassium: 5.3 mmol/L — ABNORMAL HIGH (ref 3.5–5.2)
Sodium: 136 mmol/L (ref 134–144)

## 2015-04-11 LAB — HEPATIC FUNCTION PANEL
ALT: 41 IU/L — ABNORMAL HIGH (ref 0–32)
AST: 22 IU/L (ref 0–40)
Albumin: 4.1 g/dL (ref 3.6–4.8)
Alkaline Phosphatase: 142 IU/L — ABNORMAL HIGH (ref 39–117)
Bilirubin Total: 0.2 mg/dL (ref 0.0–1.2)
Bilirubin, Direct: 0.06 mg/dL (ref 0.00–0.40)
Total Protein: 7.5 g/dL (ref 6.0–8.5)

## 2015-04-11 LAB — CLOSTRIDIUM DIFFICILE BY PCR: Toxigenic C. Difficile by PCR: NEGATIVE

## 2015-04-11 MED ORDER — SODIUM CHLORIDE 0.9 % IJ SOLN: 10.0000 mL | INTRAMUSCULAR | Status: DC | PRN

## 2015-04-11 MED ORDER — SODIUM CHLORIDE 0.9 % IJ SOLN
10.0000 mL | Freq: Two times a day (BID) | INTRAMUSCULAR | Status: DC
  Administered 2015-04-12 – 2015-04-13 (×3): 10 mL

## 2015-04-11 MED ORDER — IPRATROPIUM-ALBUTEROL 0.5-2.5 (3) MG/3ML IN SOLN
3.0000 mL | Freq: Two times a day (BID) | RESPIRATORY_TRACT | Status: DC
  Administered 2015-04-11 – 2015-04-13 (×4): 3 mL via RESPIRATORY_TRACT
  Filled 2015-04-11 (×4): qty 3

## 2015-04-11 MED ORDER — SIMVASTATIN 10 MG PO TABS
10.0000 mg | ORAL_TABLET | Freq: Every day | ORAL | Status: DC
  Administered 2015-04-11 – 2015-04-13 (×3): 10 mg via ORAL
  Filled 2015-04-11 (×3): qty 1

## 2015-04-11 MED ORDER — IPRATROPIUM-ALBUTEROL 0.5-2.5 (3) MG/3ML IN SOLN
3.0000 mL | RESPIRATORY_TRACT | Status: DC
  Administered 2015-04-11: 3 mL via RESPIRATORY_TRACT
  Filled 2015-04-11: qty 3

## 2015-04-11 MED ORDER — LEVETIRACETAM 500 MG PO TABS
500.0000 mg | ORAL_TABLET | Freq: Two times a day (BID) | ORAL | Status: DC
Start: 2015-04-11 — End: 2015-04-13
  Administered 2015-04-11 – 2015-04-13 (×5): 500 mg via ORAL
  Filled 2015-04-11 (×5): qty 1

## 2015-04-11 MED ORDER — IPRATROPIUM-ALBUTEROL 20-100 MCG/ACT IN AERS: 2.0000 | INHALATION_SPRAY | Freq: Two times a day (BID) | RESPIRATORY_TRACT | Status: DC

## 2015-04-11 MED ORDER — ONDANSETRON HCL 4 MG/2ML IJ SOLN
4.0000 mg | Freq: Four times a day (QID) | INTRAMUSCULAR | Status: DC | PRN
  Administered 2015-04-11 – 2015-04-12 (×4): 4 mg via INTRAVENOUS
  Filled 2015-04-11 (×4): qty 2

## 2015-04-11 MED ORDER — METOPROLOL TARTRATE 50 MG PO TABS
50.0000 mg | ORAL_TABLET | Freq: Two times a day (BID) | ORAL | Status: DC
  Administered 2015-04-11 – 2015-04-13 (×5): 50 mg via ORAL
  Filled 2015-04-11 (×5): qty 1

## 2015-04-11 MED ORDER — CIPROFLOXACIN IN D5W 400 MG/200ML IV SOLN
400.0000 mg | Freq: Two times a day (BID) | INTRAVENOUS | Status: DC
  Administered 2015-04-11 – 2015-04-12 (×3): 400 mg via INTRAVENOUS
  Filled 2015-04-11 (×3): qty 200

## 2015-04-11 MED ORDER — GABAPENTIN 300 MG PO CAPS
300.0000 mg | ORAL_CAPSULE | Freq: Three times a day (TID) | ORAL | Status: DC
  Administered 2015-04-11 – 2015-04-13 (×7): 300 mg via ORAL
  Filled 2015-04-11 (×7): qty 1

## 2015-04-11 MED ORDER — HEPARIN SODIUM (PORCINE) 5000 UNIT/ML IJ SOLN
5000.0000 [IU] | Freq: Three times a day (TID) | INTRAMUSCULAR | Status: DC
  Administered 2015-04-11 – 2015-04-13 (×7): 5000 [IU] via SUBCUTANEOUS
  Filled 2015-04-11 (×7): qty 1

## 2015-04-11 MED ORDER — ONDANSETRON HCL 4 MG PO TABS: 4.0000 mg | ORAL_TABLET | Freq: Four times a day (QID) | ORAL | Status: DC | PRN

## 2015-04-11 MED ORDER — AMLODIPINE BESYLATE 5 MG PO TABS
5.0000 mg | ORAL_TABLET | Freq: Every day | ORAL | Status: DC
  Administered 2015-04-11 – 2015-04-13 (×3): 5 mg via ORAL
  Filled 2015-04-11 (×3): qty 1

## 2015-04-11 MED ORDER — PANTOPRAZOLE SODIUM 40 MG PO TBEC
40.0000 mg | DELAYED_RELEASE_TABLET | Freq: Every day | ORAL | Status: DC
  Administered 2015-04-11 – 2015-04-13 (×3): 40 mg via ORAL
  Filled 2015-04-11 (×3): qty 1

## 2015-04-11 MED ORDER — DULOXETINE HCL 30 MG PO CPEP
30.0000 mg | ORAL_CAPSULE | Freq: Every morning | ORAL | Status: DC
  Administered 2015-04-11 – 2015-04-13 (×3): 30 mg via ORAL
  Filled 2015-04-11 (×3): qty 1

## 2015-04-11 MED ORDER — ZOLPIDEM TARTRATE 5 MG PO TABS
5.0000 mg | ORAL_TABLET | Freq: Every evening | ORAL | Status: DC | PRN
  Administered 2015-04-12 (×2): 5 mg via ORAL
  Filled 2015-04-11 (×2): qty 1

## 2015-04-11 MED ORDER — CETYLPYRIDINIUM CHLORIDE 0.05 % MT LIQD
7.0000 mL | Freq: Two times a day (BID) | OROMUCOSAL | Status: DC
  Administered 2015-04-11 – 2015-04-13 (×5): 7 mL via OROMUCOSAL

## 2015-04-11 MED ORDER — ASPIRIN EC 81 MG PO TBEC
81.0000 mg | DELAYED_RELEASE_TABLET | Freq: Every day | ORAL | Status: DC
  Administered 2015-04-11 – 2015-04-13 (×3): 81 mg via ORAL
  Filled 2015-04-11 (×3): qty 1

## 2015-04-11 MED ORDER — SODIUM CHLORIDE 0.9 % IJ SOLN
3.0000 mL | Freq: Two times a day (BID) | INTRAMUSCULAR | Status: DC
  Administered 2015-04-11 – 2015-04-12 (×3): 3 mL via INTRAVENOUS

## 2015-04-11 MED ORDER — QUETIAPINE FUMARATE 25 MG PO TABS
50.0000 mg | ORAL_TABLET | Freq: Every day | ORAL | Status: DC
  Administered 2015-04-12: 50 mg via ORAL
  Filled 2015-04-11 (×2): qty 2

## 2015-04-11 MED ORDER — METRONIDAZOLE IN NACL 5-0.79 MG/ML-% IV SOLN
500.0000 mg | Freq: Three times a day (TID) | INTRAVENOUS | Status: DC
  Administered 2015-04-11 – 2015-04-12 (×5): 500 mg via INTRAVENOUS
  Filled 2015-04-11 (×5): qty 100

## 2015-04-11 MED ORDER — LORAZEPAM 1 MG PO TABS
1.0000 mg | ORAL_TABLET | Freq: Two times a day (BID) | ORAL | Status: DC | PRN
  Administered 2015-04-11 – 2015-04-13 (×4): 1 mg via ORAL
  Filled 2015-04-11 (×4): qty 1

## 2015-04-11 MED ORDER — CALCIUM ACETATE (PHOS BINDER) 667 MG PO CAPS
667.0000 mg | ORAL_CAPSULE | Freq: Three times a day (TID) | ORAL | Status: DC
Start: 2015-04-11 — End: 2015-04-13
  Administered 2015-04-11 – 2015-04-13 (×6): 667 mg via ORAL
  Filled 2015-04-11 (×6): qty 1

## 2015-04-11 MED ORDER — IOHEXOL 300 MG/ML  SOLN
50.0000 mL | Freq: Once | INTRAMUSCULAR | Status: AC | PRN
  Administered 2015-04-11: 50 mL via ORAL

## 2015-04-11 MED ORDER — SODIUM CHLORIDE 0.9 % IV SOLN
INTRAVENOUS | Status: DC
  Administered 2015-04-11: 02:00:00 via INTRAVENOUS
  Administered 2015-04-11: 100 mL/h via INTRAVENOUS

## 2015-04-11 MED ORDER — CLONIDINE HCL 0.3 MG/24HR TD PTWK: 0.3000 mg | MEDICATED_PATCH | TRANSDERMAL | Status: DC

## 2015-04-11 MED ORDER — BOOST / RESOURCE BREEZE PO LIQD
1.0000 | Freq: Two times a day (BID) | ORAL | Status: DC
  Administered 2015-04-11 – 2015-04-13 (×5): 1 via ORAL

## 2015-04-11 MED ORDER — LOSARTAN POTASSIUM 50 MG PO TABS
25.0000 mg | ORAL_TABLET | Freq: Every day | ORAL | Status: DC
  Filled 2015-04-11 (×3): qty 1

## 2015-04-11 MED ORDER — ENSURE ENLIVE PO LIQD: 237.0000 mL | Freq: Two times a day (BID) | ORAL | Status: DC

## 2015-04-11 NOTE — Progress Notes (Signed)
[facility] PROGRESS NOTE  LARIKA TIEDEMAN [id number] DOB: May 07, 1951 DOA: 04/10/2015 PCP: Chevis Pretty, FNP  Assessment/Plan: 1. Nausea/vomiting/diarrhea -Patient presenting with complaints of multiple episodes of nausea/vomiting and diarrhea, presenting dehydrated with electrolyte abnormalities. She was worked up with a CT scan of abdomen and pelvis which not reveal acute abdominal pathology. It is possible symptoms may be related to infectious gastroenteritis/viral syndrome. Pending stool for Cdiff. On enteric precautions.  -This morning she complains of ongoing nausea has not had emesis however. Will continue IV fluid resuscitation, empiric antimicrobial therapy with ciprofloxacin and Flagyl. -Monitor BMP, provide supportive care.   2.  Metabolic acidosis  - suspect related to GI illness, initial lab work showed bicarbonate of 14, trending up to 17  -Continue IV fluid resuscitation, supportive care.  3.  Stage III/IV chronic kidney disease, acute on chronic renal failure -Patient having baseline creatinine near 1.5 - 1.8, with initial labs revealing Creatinine of 2.78. -A related to prerenal azotemia/hypovolemia resulted from GI losses -Continue IV fluid resuscitation with normal saline. Discontinue Cozaar.   4. Hypertension -Blood pressure stable, continue amlodipine 5 mg by mouth daily and metoprolol 50 g by mouth twice a day  -Will hold Cozaar due to acute kidney injury   5.  History of seizure disorder. -Stable, continue Keppra 500 mg by mouth twice a daily  Code Status: Full code  Family Communication:  Disposition Plan: Continue IV fluids, supportive care   Antibiotics:  Ciprofloxacin    Flagyl  HPI/Subjective: Patient is a pleasant 64 year old female with a past medical history of MGUS, GERD, hypertension, history of seizures, admitted to medicine service on 04/10/2015 presenting with complaints of nausea vomiting associated with diarrhea. Workup  included a CT scan of abdomen and pelvis without IV contrast which showed diverticulosis of sigmoid colon, no evidence of bowel obstruction or inflammation. Symptoms attributed to infectious source.  patient was started on IV fluid resuscitation along with empiric IV antimicrobial therapy with ciprofloxacin and Flagyl.   Objective: Filed Vitals:   04/11/15 0631  BP: 142/57  Pulse: 111  Temp: 98.7 F (37.1 C)  Resp: 20   No intake or output data in the 24 hours ending 04/11/15 0955 Filed Weights   04/11/15 0107  Weight: 99.61 kg (219 lb 9.6 oz)    Exam:   General:  Ill-appearing, states feeling nauseous, no acute distress awake and alert oriented  Cardiovascular: 3-6 systolic ejection murmur rubs or gallops no extremity edema   Respiratory: normal respiratory effort no wheezing rhonchi or rales  Abdomen: on abdominal exam, soft, nontender nondistended overall having benign abdomen   Musculoskeletal:  no extremity edema   Data Reviewed: Basic Metabolic Panel:  Recent Labs Lab 04/10/15 1240 04/10/15 1745 04/10/15 2047 04/10/15 2253  NA 136 136 149* 136  K 5.3* 5.8* 2.8* 5.1  CL 104 110 120* 110  CO2 14* 14*  --  17*  GLUCOSE 129* 102* 66 108*  BUN 46* 49* 26* 48*  CREATININE 2.22* 2.78* 1.40* 2.39*  CALCIUM 10.1 10.1  --  8.9   Liver Function Tests:  Recent Labs Lab 04/10/15 1240 04/10/15 1745  AST 22 40  ALT 41* 45  ALKPHOS 142* 134*  BILITOT 0.2 0.8  PROT 7.5 8.2*  ALBUMIN  --  4.0    Recent Labs Lab 04/10/15 1745  LIPASE 37   No results for input(s): AMMONIA in the last 168 hours. CBC:  Recent Labs Lab 04/10/15 1244 04/10/15 1745 04/10/15 2047 04/10/15 2253  WBC  9.3 10.4  --  9.1  NEUTROABS  --  7.6  --  6.1  HGB 14.4 15.9* 9.2* 13.2  HCT 47.9 48.5* 27.0* 40.9  MCV 95.8 98.2  --  96.9  PLT  --  386  --  383   Cardiac Enzymes: No results for input(s): CKTOTAL, CKMB, CKMBINDEX, TROPONINI in the last 168 hours. BNP (last 3 results) No  results for input(s): BNP in the last 8760 hours.  ProBNP (last 3 results) No results for input(s): PROBNP in the last 8760 hours.  CBG: No results for input(s): GLUCAP in the last 168 hours.  No results found for this or any previous visit (from the past 240 hour(s)).   Studies: Ct Abdomen Pelvis Wo Contrast  04/11/2015   CLINICAL DATA:  Intractable nausea and vomiting, diarrhea, bone about like acidosis. Fever and chills.  EXAM: CT ABDOMEN AND PELVIS WITHOUT CONTRAST  TECHNIQUE: Multidetector CT imaging of the abdomen and pelvis was performed following the standard protocol without IV contrast.  COMPARISON:  None.  FINDINGS: Atelectasis in the lung bases. Calcified lymph nodes in the mediastinum.  Elevation of the right hemidiaphragm. Surgical absence of the gallbladder. Calcified granulomas in the spleen. Duodenum diverticulum. The unenhanced appearance of the liver, pancreas, adrenal glands, kidneys, abdominal aorta, inferior vena cava, and retroperitoneal lymph nodes is unremarkable. Stomach, small bowel, and colon are decompressed. Contrast material flows through to the rectum without evidence of large or small bowel obstruction. Lipoma at the ileocecal valve.No free air or free fluid in the abdomen.  Pelvis: Diverticulosis of the sigmoid colon without evidence of diverticulitis. No free or loculated pelvic fluid collections. Bladder wall is not thickened. Uterus appears surgically absent. No pelvic mass or degenerative changes in the spine and shoulders. No destructive bone lesions appreciated. Lymphadenopathy.  IMPRESSION: Elevation of the right hemidiaphragm with atelectasis in the lung bases. Calcified granulomas in the spleen. Duodenum diverticulum. Lipoma in the ileocecal valve. Diverticulosis of the sigmoid colon. No evidence of bowel obstruction or inflammation.   Electronically Signed   By: Lucienne Capers M.D.   On: 04/11/2015 05:55    Scheduled Meds: . amLODipine  5 mg Oral Daily   . antiseptic oral rinse  7 mL Mouth Rinse BID  . aspirin EC  81 mg Oral Daily  . calcium acetate  667 mg Oral TID WC  . ciprofloxacin  400 mg Intravenous Q12H  . DULoxetine  30 mg Oral q morning - 10a  . feeding supplement (RESOURCE BREEZE)  1 Container Oral BID BM  . gabapentin  300 mg Oral TID  . heparin  5,000 Units Subcutaneous 3 times per day  . ipratropium-albuterol  3 mL Nebulization BID  . levETIRAcetam  500 mg Oral BID  . losartan  25 mg Oral Daily  . metoprolol  50 mg Oral BID  . metronidazole  500 mg Intravenous Q8H  . pantoprazole  40 mg Oral Daily  . QUEtiapine  50 mg Oral QHS  . simvastatin  10 mg Oral Daily  . sodium chloride  3 mL Intravenous Q12H   Continuous Infusions: . sodium chloride    . sodium chloride 75 mL/hr at 04/11/15 0226    Active Problems:   Vomiting   Metabolic acidosis   Diarrhea   Acute on chronic kidney failure    Time spent: 35 min    Kelvin Cellar  [facility] Pager [phone] 7PM-7AM, please contact night-coverage at [email or web address], password [facility] 04/11/2015, 9:55 AM

## 2015-04-11 NOTE — Progress Notes (Signed)
Peripherally Inserted Central Catheter/Midline Placement  The IV Nurse has discussed with the patient and/or persons authorized to consent for the patient, the purpose of this procedure and the potential benefits and risks involved with this procedure.  The benefits include less needle sticks, lab draws from the catheter and patient may be discharged home with the catheter.  Risks include, but not limited to, infection, bleeding, blood clot (thrombus formation), and puncture of an artery; nerve damage and irregular heat beat.  Alternatives to this procedure were also discussed.  PICC/Midline Placement Documentation  PICC / Midline Single Lumen [id number] PICC Right Basilic 43 cm 1 cm (Active)  Indication for Insertion or Continuance of Line Limited venous access - need for IV therapy >5 days (PICC only) 04/11/2015  7:00 PM  Exposed Catheter (cm) 1 cm 04/11/2015  7:00 PM  Site Assessment Clean;Dry;Intact 04/11/2015  7:00 PM  Line Status Flushed;Saline locked;Blood return noted 04/11/2015  7:00 PM  Dressing Type Transparent 04/11/2015  7:00 PM  Dressing Status Clean;Dry;Intact 04/11/2015  7:00 PM  Line Care Connections checked and tightened 04/11/2015  7:00 PM  Dressing Intervention New dressing 04/11/2015  7:00 PM  Dressing Change Due 04/18/15 04/11/2015  7:00 PM       Hillery Jacks 04/11/2015, 7:50 PM

## 2015-04-11 NOTE — Progress Notes (Addendum)
INITIAL NUTRITION ASSESSMENT Pt meets criteria for SEVERE MALNUTRITION in the context of ACUTE ILLNESS as evidenced by Eating less than 50% estimated needs for >5 days and loss of 4% bw in 2 weeks. DOCUMENTATION CODES Per approved criteria  -Severe malnutrition in the context of acute illness or injury -Obesity (unspecified)   INTERVENTION: Resource Breeze po BID, each supplement provides 250 kcal and 9 grams of protein  Monitor oral intake and change supplements/add snacks as warranted  NUTRITION DIAGNOSIS: Inadequate oral intake related to intractable nausea and vomiting as evidenced by loss of 10#s (4%) in 2 weeks.   Goal: Pt to meet >/= 90% of their estimated nutrition needs   Monitor:  Oral intake, fluid status, weight,  labs  Reason for Assessment: MST  64 y.o. female  Admitting Dx: <principal problem not specified>  ASSESSMENT: 64 y.o. year old female with significant past medical history of HTN, renal insufficiency (stage 4), seizure disorder presenting with intractable nausea and vomiting, diarrhea, metabolic acidosis  -Per notes- Intractable N/V x1-10 days week-Pt very little PO intake d/t symptoms  Pt claims 20 lb weight loss. Epic records show atleast 10 lbs  Height: Ht Readings from Last 1 Encounters:  04/11/15 5\' 7"  (1.702 m)    Weight: Wt Readings from Last 1 Encounters:  04/11/15 219 lb 9.6 oz (99.61 kg)    Ideal Body Weight: 135 lbs  % Ideal Body Weight: 162%  Wt Readings from Last 10 Encounters:  04/11/15 219 lb 9.6 oz (99.61 kg)  04/10/15 218 lb (98.884 kg)  03/27/15 229 lb 9.6 oz (104.146 kg)  03/13/15 236 lb (107.049 kg)  02/26/15 229 lb 12.8 oz (104.237 kg)  02/05/15 234 lb (106.142 kg)  11/14/14 230 lb (104.327 kg)  09/26/14 229 lb 1.6 oz (103.919 kg)  08/14/14 228 lb (103.42 kg)  08/13/14 227 lb (102.967 kg)  Loss of 10 lbs in 2 weeks  Usual Body Weight: ~227 lbs-235 lbs  % Usual Body Weight: 96%  BMI:  Body mass index is 34.39  kg/(m^2).  Estimated Nutritional Needs: Kcal: 1500-1600 kcals (15-16 kcals/kg) Protein: 61-73 kcal/kg Fluid: 1.5-1.6 liters+enough to replace fluid lost from diarrhea  Skin: WDL  Diet Order: Diet Heart Room service appropriate?: Yes; Fluid consistency:: Thin  EDUCATION NEEDS: -No education needs identified at this time  No intake or output data in the 24 hours ending 04/11/15 0902  Last BM: 5/13- diarrhea   Labs:   Recent Labs Lab 04/10/15 1240 04/10/15 1745 04/10/15 2047 04/10/15 2253  NA 136 136 149* 136  K 5.3* 5.8* 2.8* 5.1  CL 104 110 120* 110  CO2 14* 14*  --  17*  BUN 46* 49* 26* 48*  CREATININE 2.22* 2.78* 1.40* 2.39*  CALCIUM 10.1 10.1  --  8.9  GLUCOSE 129* 102* 66 108*    CBG (last 3)  No results for input(s): GLUCAP in the last 72 hours.  Scheduled Meds: . amLODipine  5 mg Oral Daily  . antiseptic oral rinse  7 mL Mouth Rinse BID  . aspirin EC  81 mg Oral Daily  . calcium acetate  667 mg Oral TID WC  . ciprofloxacin  400 mg Intravenous Q12H  . DULoxetine  30 mg Oral q morning - 10a  . feeding supplement (ENSURE ENLIVE)  237 mL Oral BID BM  . gabapentin  300 mg Oral TID  . heparin  5,000 Units Subcutaneous 3 times per day  . ipratropium-albuterol  3 mL Nebulization BID  . levETIRAcetam  500 mg Oral BID  . losartan  25 mg Oral Daily  . metoprolol  50 mg Oral BID  . metronidazole  500 mg Intravenous Q8H  . pantoprazole  40 mg Oral Daily  . QUEtiapine  50 mg Oral QHS  . simvastatin  10 mg Oral Daily  . sodium chloride  3 mL Intravenous Q12H    Continuous Infusions: . sodium chloride    . sodium chloride 75 mL/hr at 04/11/15 [id number]    Past Medical History  Diagnosis Date  . Seizures   . Chronic headaches   . Hypertension   . Osteoporosis   . Depression   . 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 URI (upper respiratory infection) 08/2014    early October  . Renal insufficiency     Past Surgical History   Procedure Laterality Date  . Breast reduction surgery    . Abdominal hysterectomy    . Lung lobectomy      rt lung-cyst   . Appendectomy    . Cholecystectomy    . Back surgery  2002    Burtis Junes RD, LDN Nutrition Pager: [phone] 04/11/2015 9:02 AM

## 2015-04-11 NOTE — H&P (Signed)
Hospitalist Admission History and Physical  Patient name: [name] Medical record number: [id number] Date of birth: 07-Oct-1951 Age: 64 y.o. Gender: female  Primary Care Provider: Chevis Pretty, FNP  Chief Complaint: intractable nausea and vomiting, metabolic acidosis   History of Present Illness:This is a 64 y.o. year old female with significant past medical history of HTN, renal insufficiency, seizure disorder presenting with intractable nausea and vomiting, diarrhea, metabolic acidosis. Pt states that she has had persistent nausea and vomiting for the past week. Subjective fevers and chills. Also with watery diarrhea. Mild abdominal pain. No known sick contacts. Has had very little by mouth intake secondary to symptoms. Presented to the ER afebrile, hemodynamically stable. CBC within normal lives. There is some initial chemistry irregularities on first BMP and follow-up i-STAT. Second BMP consistent with the first. Predominant finding is a creatinine around 2.3, bicarbonate 17. Lactic acid within normal limits.  Assessment and Plan:  Active Problems:   Vomiting   Metabolic acidosis   Diarrhea   1- Vomiting and diarrhea -unclear etiology w/ concern for possible infectious source -CT abd and pelvis pending given mild abd pain -stool studies, c diff -IV cipro and flagyl  -dry on exam -hydrate  2-Metabolic Acidosis -likely secondary to above -AG WNL -lactate WNL  -noted baseline stage 4 CKD -hydrate pt  -follow -IV bicarb if not improved on follow up   3-CKD  -stage 4 chronically  -Acute exacerbation in setting of above -may benefit from renal consult if renal function fails to improve   4-HTN -BP stable  -cont home regimen  5-Asthma -stable  -cont home regimen   FEN/GI: heart healthy diet as tolerated. PPI  Prophylaxis: sub q heparin  Disposition: pending further evaluation  Code Status:Full Code    Patient Active Problem List   Diagnosis  Date Noted  . Vomiting 04/11/2015  . Gastroesophageal reflux disease without esophagitis 03/13/2015  . Neuropathy 03/13/2015  . 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 Insomnia 03/25/2013  . Depression 03/25/2013  . Seizures 03/25/2013  . Hyperlipidemia 03/25/2013  . 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 Essential hypertension 04/21/2010  . DOE (dyspnea on exertion) 04/21/2010  . CHEST PAIN-UNSPECIFIED 04/21/2010   Past Medical History: Past Medical History  Diagnosis Date  . Seizures   . Chronic headaches   . Hypertension   . Osteoporosis   . Depression   . 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 URI (upper respiratory infection) 08/2014    early October  . Renal insufficiency     Past Surgical History: Past Surgical History  Procedure Laterality Date  . Breast reduction surgery    . Abdominal hysterectomy    . Lung lobectomy      rt lung-cyst   . Appendectomy    . Cholecystectomy    . Back surgery  2002    Social History: History   Social History  . Marital Status: Widowed    Spouse Name: N/A  . Number of Children: N/A  . Years of Education: N/A   Occupational History  . unemployed    Social History Main Topics  . Smoking status: Former Smoker -- 1.00 packs/day for 30 years    Types: Cigarettes    Quit date: 11/13/2012  . Smokeless tobacco: Never Used  . Alcohol Use: No  . Drug Use: No  . Sexual Activity: Not on file   Other Topics Concern  . None   Social History Narrative    Family History:  Family History  Problem Relation Age of Onset  . Hypertension Mother   . Heart disease Mother   . Diabetes Mother   . Kidney disease Mother   . Arthritis Mother   . Hypertension Father     Allergies: Allergies  Allergen Reactions  . Asa [Aspirin]     High dose Aspirin causes itching but can take 81mg    . Penicillins Hives  . Sulfonamide Derivatives Hives  . Tylenol With Codeine #3  [Acetaminophen-Codeine] Itching    Current Facility-Administered Medications  Medication Dose Route Frequency Provider Last Rate Last Dose  . 0.9 %  sodium chloride infusion   Intravenous Continuous Daleen Bo, MD      . 0.9 %  sodium chloride infusion   Intravenous Continuous Deneise Lever, MD      . amLODipine (NORVASC) tablet 5 mg  5 mg Oral Daily Deneise Lever, MD      . aspirin EC tablet 81 mg  81 mg Oral Daily Deneise Lever, MD      . calcium acetate (PHOSLO) capsule 667 mg  667 mg Oral TID Deneise Lever, MD      . cloNIDine (CATAPRES - Dosed in mg/24 hr) patch 0.3 mg  0.3 mg Transdermal Weekly Deneise Lever, MD      . DULoxetine (CYMBALTA) DR capsule 30 mg  30 mg Oral q morning - 10a Deneise Lever, MD      . gabapentin (NEURONTIN) capsule 300 mg  300 mg Oral TID Deneise Lever, MD      . heparin injection 5,000 Units  5,000 Units Subcutaneous 3 times per day Deneise Lever, MD      . Ipratropium-Albuterol (COMBIVENT) respimat 2 puff  2 puff Inhalation BID Deneise Lever, MD      . levETIRAcetam (KEPPRA) tablet 500 mg  500 mg Oral BID Deneise Lever, MD      . losartan (COZAAR) tablet 25 mg  25 mg Oral Daily Deneise Lever, MD      . metoprolol (LOPRESSOR) tablet 50 mg  50 mg Oral BID Deneise Lever, MD      . ondansetron [facility]) tablet 4 mg  4 mg Oral Q6H PRN Deneise Lever, MD       Or  . ondansetron [facility]) injection 4 mg  4 mg Intravenous Q6H PRN Deneise Lever, MD      . pantoprazole (PROTONIX) EC tablet 40 mg  40 mg Oral Daily Deneise Lever, MD      . QUEtiapine (SEROQUEL) tablet 50 mg  50 mg Oral QHS Deneise Lever, MD      . simvastatin (ZOCOR) tablet 10 mg  10 mg Oral Daily Deneise Lever, MD      . sodium chloride 0.9 % injection 3 mL  3 mL Intravenous Q12H Deneise Lever, MD       Current Outpatient Prescriptions  Medication Sig Dispense Refill  . amLODipine (NORVASC) 5 MG tablet Take 1 tablet (5 mg total) by mouth daily. 30 tablet 5  .  aspirin EC 81 MG tablet Take 81 mg by mouth daily.    . calcium acetate (PHOSLO) 667 MG capsule Take 1 capsule by mouth 3 (three) times daily.    . cholecalciferol (VITAMIN D) 1000 UNITS tablet Take 1,000 Units by mouth daily.    . cloNIDine (CATAPRES - DOSED IN MG/24 HR) 0.3 mg/24hr patch Place 1 patch (0.3 mg total) onto the skin  once a week. 4 patch 5  . DULoxetine (CYMBALTA) 30 MG capsule Take 1 capsule (30 mg total) by mouth every morning. 30 capsule 2  . DULoxetine (CYMBALTA) 60 MG capsule Take 1 capsule (60 mg total) by mouth at bedtime. 30 capsule 5  . ferrous sulfate 325 (65 FE) MG tablet Take 325 mg by mouth daily with breakfast.    . gabapentin (NEURONTIN) 300 MG capsule Take 1 capsule (300 mg total) by mouth 3 (three) times daily. 30 capsule 5  . Ipratropium-Albuterol (COMBIVENT RESPIMAT) 20-100 MCG/ACT AERS respimat 2 puffs twice a day (Patient taking differently: Inhale 2 puffs into the lungs 2 (two) times daily. ) 1 Inhaler 11  . levETIRAcetam (KEPPRA) 500 MG tablet TAKE 1 TABLET BY MOUTH TWICE DAILY 60 tablet 1  . lidocaine (XYLOCAINE) 4 % external solution Apply topically as needed for mild pain or moderate pain (for headache pain).     . LORazepam (ATIVAN) 1 MG tablet Take 1 tablet (1 mg total) by mouth 2 (two) times daily. 60 tablet 1  . losartan (COZAAR) 25 MG tablet Take 1 tablet (25 mg total) by mouth daily. 30 tablet 5  . meloxicam (MOBIC) 15 MG tablet Take 1 tablet (15 mg total) by mouth daily. 30 tablet 3  . metoprolol (LOPRESSOR) 50 MG tablet Take 1 tablet (50 mg total) by mouth 2 (two) times daily. 60 tablet 5  . pantoprazole (PROTONIX) 40 MG tablet Take 1 tablet (40 mg total) by mouth daily. 30 tablet 5  . QUEtiapine (SEROQUEL) 50 MG tablet Take 50 mg by mouth at bedtime.    . simvastatin (ZOCOR) 10 MG tablet TAKE 1 TABLET BY MOUTH ONCE DAILY 30 tablet 11  . zolpidem (AMBIEN) 10 MG tablet TAKE 1 TABLET BY MOUTH ONCE DAILY AT BEDTIME AS NEEDED FOR SLEEP 30 tablet 1    Review Of Systems: 12 point ROS negative except as noted above in HPI.  Physical Exam: Filed Vitals:   04/10/15 2259  BP: 144/77  Pulse: 61  Temp:   Resp: 18    General: alert and cooperative HEENT: PERRLA, extra ocular movement intact and dry oral mucosa Heart: S1, S2 normal, no murmur, rub or gallop, regular rate and rhythm Lungs: clear to auscultation, no wheezes or rales and unlabored breathing Abdomen: + bowel sounds, mild lower abd TTP  Extremities: extremities normal, atraumatic, no cyanosis or edema Skin:no rashes, no ecchymoses Neurology: normal without focal findings  Labs and Imaging: Lab Results  Component Value Date/Time   NA 136 04/10/2015 10:53 PM   NA 143 03/13/2015 10:45 AM   K 5.1 04/10/2015 10:53 PM   CL 110 04/10/2015 10:53 PM   CO2 17* 04/10/2015 10:53 PM   BUN 48* 04/10/2015 10:53 PM   BUN 20 03/13/2015 10:45 AM   CREATININE 2.39* 04/10/2015 10:53 PM   CREATININE 1.40* 06/05/2013 11:03 AM   GLUCOSE 108* 04/10/2015 10:53 PM   GLUCOSE 86 03/13/2015 10:45 AM   Lab Results  Component Value Date   WBC 9.1 04/10/2015   HGB 13.2 04/10/2015   HCT 40.9 04/10/2015   MCV 96.9 04/10/2015   PLT 383 04/10/2015    No results found.         Shanda Howells MD  Pager: [phone]

## 2015-04-11 NOTE — Progress Notes (Signed)
Richarda Osmond, RN notified that PICC was ready for use. Confirmed by xray. Will proceed to run ordered IV fluids. Will continue to monitor site and patient.

## 2015-04-12 DIAGNOSIS — N189 Chronic kidney disease, unspecified: Secondary | ICD-10-CM

## 2015-04-12 DIAGNOSIS — R197 Diarrhea, unspecified: Secondary | ICD-10-CM

## 2015-04-12 DIAGNOSIS — N179 Acute kidney failure, unspecified: Principal | ICD-10-CM

## 2015-04-12 DIAGNOSIS — E872 Acidosis: Secondary | ICD-10-CM

## 2015-04-12 DIAGNOSIS — R112 Nausea with vomiting, unspecified: Secondary | ICD-10-CM

## 2015-04-12 LAB — COMPREHENSIVE METABOLIC PANEL
ALT: 24 U/L (ref 14–54)
ALT: 25 U/L (ref 14–54)
AST: 15 U/L (ref 15–41)
AST: 17 U/L (ref 15–41)
Albumin: 3.1 g/dL — ABNORMAL LOW (ref 3.5–5.0)
Albumin: 3.3 g/dL — ABNORMAL LOW (ref 3.5–5.0)
Alkaline Phosphatase: 100 U/L (ref 38–126)
Alkaline Phosphatase: 100 U/L (ref 38–126)
Anion gap: 3 — ABNORMAL LOW (ref 5–15)
Anion gap: 5 (ref 5–15)
BUN: 28 mg/dL — ABNORMAL HIGH (ref 6–20)
BUN: 33 mg/dL — ABNORMAL HIGH (ref 6–20)
CO2: 22 mmol/L (ref 22–32)
CO2: 22 mmol/L (ref 22–32)
Calcium: 8.6 mg/dL — ABNORMAL LOW (ref 8.9–10.3)
Calcium: 9.2 mg/dL (ref 8.9–10.3)
Chloride: 113 mmol/L — ABNORMAL HIGH (ref 101–111)
Chloride: 113 mmol/L — ABNORMAL HIGH (ref 101–111)
Creatinine, Ser: 1.75 mg/dL — ABNORMAL HIGH (ref 0.44–1.00)
Creatinine, Ser: 1.79 mg/dL — ABNORMAL HIGH (ref 0.44–1.00)
GFR calc Af Amer: 34 mL/min — ABNORMAL LOW (ref >60)
GFR calc Af Amer: 35 mL/min — ABNORMAL LOW (ref >60)
GFR calc non Af Amer: 29 mL/min — ABNORMAL LOW (ref >60)
GFR calc non Af Amer: 30 mL/min — ABNORMAL LOW (ref >60)
Glucose, Bld: 105 mg/dL — ABNORMAL HIGH (ref 65–99)
Glucose, Bld: 146 mg/dL — ABNORMAL HIGH (ref 65–99)
Potassium: 4.4 mmol/L (ref 3.5–5.1)
Potassium: 4.9 mmol/L (ref 3.5–5.1)
Sodium: 138 mmol/L (ref 135–145)
Sodium: 140 mmol/L (ref 135–145)
Total Bilirubin: 0.2 mg/dL — ABNORMAL LOW (ref 0.3–1.2)
Total Bilirubin: 0.4 mg/dL (ref 0.3–1.2)
Total Protein: 6.5 g/dL (ref 6.5–8.1)
Total Protein: 7 g/dL (ref 6.5–8.1)

## 2015-04-12 MED ORDER — SODIUM CHLORIDE 0.9 % IV SOLN
INTRAVENOUS | Status: DC
  Administered 2015-04-13: via INTRAVENOUS

## 2015-04-12 MED ORDER — METRONIDAZOLE 500 MG PO TABS
500.0000 mg | ORAL_TABLET | Freq: Three times a day (TID) | ORAL | Status: DC
  Administered 2015-04-12 – 2015-04-13 (×3): 500 mg via ORAL
  Filled 2015-04-12 (×3): qty 1

## 2015-04-12 MED ORDER — CIPROFLOXACIN HCL 250 MG PO TABS
500.0000 mg | ORAL_TABLET | Freq: Two times a day (BID) | ORAL | Status: DC
  Administered 2015-04-12 – 2015-04-13 (×3): 500 mg via ORAL
  Filled 2015-04-12 (×3): qty 2

## 2015-04-12 NOTE — Progress Notes (Signed)
[facility] PROGRESS NOTE  [name] [id number] DOB: March 07, 1951 DOA: 04/10/2015 PCP: Chevis Pretty, FNP  Assessment/Plan: 1. Nausea/vomiting/diarrhea -Patient presenting with complaints of multiple episodes of nausea/vomiting and diarrhea, presenting dehydrated with electrolyte abnormalities. She was worked up with a CT scan of abdomen and pelvis which not reveal acute abdominal pathology. It is possible symptoms may be related to infectious gastroenteritis/viral syndrome.  -Stool for C.diff coming back negative.  -Patient reporting feeling better today, denies N/V had 1 loose BM overnight.  -Will transition to oral antibiotics, continue IV fluids for now, anticipate discharge in the next 24 hours if she continues to improve  2.  Metabolic acidosis  - suspect related to GI illness, initial lab work showed bicarbonate of 14, am labs showing improvement at 22  3.  Stage III/IV chronic kidney disease, acute on chronic renal failure -Patient having baseline creatinine near 1.5 - 1.8, with initial labs revealing Creatinine of 2.78. -A related to prerenal azotemia/hypovolemia resulted from GI losses -Will continue IV fluids for 1 more day  4. Hypertension -Blood pressure stable, continue amlodipine 5 mg by mouth daily and metoprolol 50 g by mouth twice a day  -Held Cozaar due to acute kidney injury   5.  History of seizure disorder. -Stable, continue Keppra 500 mg by mouth twice a daily  Code Status: Full code  Family Communication:  Disposition Plan: Anticipate discharge in the next 24 hours   Antibiotics:  Ciprofloxacin    Flagyl  HPI/Subjective: Patient is a pleasant 64 year old female with a past medical history of MGUS, GERD, hypertension, history of seizures, admitted to medicine service on 04/10/2015 presenting with complaints of nausea vomiting associated with diarrhea. Workup included a CT scan of abdomen and pelvis without IV contrast which showed  diverticulosis of sigmoid colon, no evidence of bowel obstruction or inflammation. Symptoms attributed to infectious source.  patient was started on IV fluid resuscitation along with empiric IV antimicrobial therapy with ciprofloxacin and Flagyl.   Objective: Filed Vitals:   04/12/15 0849  BP: 125/55  Pulse: 84  Temp:   Resp:     Intake/Output Summary (Last 24 hours) at 04/12/15 0902 Last data filed at 04/12/15 0840  Gross per 24 hour  Intake 2686.67 ml  Output      3 ml  Net 2683.67 ml   Filed Weights   04/11/15 0107  Weight: 99.61 kg (219 lb 9.6 oz)    Exam:   General: Patient reporting feeling better today, she is awake and alert  Cardiovascular: 3-6 systolic ejection murmur rubs or gallops no extremity edema   Respiratory: normal respiratory effort no wheezing rhonchi or rales  Abdomen: on abdominal exam, soft, nontender nondistended overall having benign abdomen   Musculoskeletal:  no extremity edema   Data Reviewed: Basic Metabolic Panel:  Recent Labs Lab 04/10/15 1240 04/10/15 1745 04/10/15 2047 04/10/15 2253 04/11/15 1217 04/12/15 0011  NA 136 136 149* 136 137 140  K 5.3* 5.8* 2.8* 5.1 4.6 4.9  CL 104 110 120* 110 109 113*  CO2 14* 14*  --  17* 21* 22  GLUCOSE 129* 102* 66 108* 135* 105*  BUN 46* 49* 26* 48* 42* 33*  CREATININE 2.22* 2.78* 1.40* 2.39* 2.00* 1.75*  CALCIUM 10.1 10.1  --  8.9 8.9 9.2   Liver Function Tests:  Recent Labs Lab 04/10/15 1240 04/10/15 1745 04/11/15 1217 04/12/15 0011  AST 22 40 20 15  ALT 41* 45 31 25  ALKPHOS 142* 134* 113 100  BILITOT 0.2 0.8 0.6 0.4  PROT 7.5 8.2* 7.0 6.5  ALBUMIN  --  4.0 3.4* 3.1*    Recent Labs Lab 04/10/15 1745  LIPASE 37   No results for input(s): AMMONIA in the last 168 hours. CBC:  Recent Labs Lab 04/10/15 1244 04/10/15 1745 04/10/15 2047 04/10/15 2253  WBC 9.3 10.4  --  9.1  NEUTROABS  --  7.6  --  6.1  HGB 14.4 15.9* 9.2* 13.2  HCT 47.9 48.5* 27.0* 40.9  MCV 95.8  98.2  --  96.9  PLT  --  386  --  383   Cardiac Enzymes: No results for input(s): CKTOTAL, CKMB, CKMBINDEX, TROPONINI in the last 168 hours. BNP (last 3 results) No results for input(s): BNP in the last 8760 hours.  ProBNP (last 3 results) No results for input(s): PROBNP in the last 8760 hours.  CBG: No results for input(s): GLUCAP in the last 168 hours.  Recent Results (from the past 240 hour(s))  Clostridium Difficile by PCR     Status: None   Collection Time: 04/11/15  6:00 PM  Result Value Ref Range Status   C difficile by pcr NEGATIVE NEGATIVE Final     Studies: Ct Abdomen Pelvis Wo Contrast  04/11/2015   CLINICAL DATA:  Intractable nausea and vomiting, diarrhea, bone about like acidosis. Fever and chills.  EXAM: CT ABDOMEN AND PELVIS WITHOUT CONTRAST  TECHNIQUE: Multidetector CT imaging of the abdomen and pelvis was performed following the standard protocol without IV contrast.  COMPARISON:  None.  FINDINGS: Atelectasis in the lung bases. Calcified lymph nodes in the mediastinum.  Elevation of the right hemidiaphragm. Surgical absence of the gallbladder. Calcified granulomas in the spleen. Duodenum diverticulum. The unenhanced appearance of the liver, pancreas, adrenal glands, kidneys, abdominal aorta, inferior vena cava, and retroperitoneal lymph nodes is unremarkable. Stomach, small bowel, and colon are decompressed. Contrast material flows through to the rectum without evidence of large or small bowel obstruction. Lipoma at the ileocecal valve.No free air or free fluid in the abdomen.  Pelvis: Diverticulosis of the sigmoid colon without evidence of diverticulitis. No free or loculated pelvic fluid collections. Bladder wall is not thickened. Uterus appears surgically absent. No pelvic mass or degenerative changes in the spine and shoulders. No destructive bone lesions appreciated. Lymphadenopathy.  IMPRESSION: Elevation of the right hemidiaphragm with atelectasis in the lung bases.  Calcified granulomas in the spleen. Duodenum diverticulum. Lipoma in the ileocecal valve. Diverticulosis of the sigmoid colon. No evidence of bowel obstruction or inflammation.   Electronically Signed   By: Lucienne Capers M.D.   On: 04/11/2015 05:55   Dg Chest Port 1 View  04/11/2015   CLINICAL DATA:  Central line placement.  Initial encounter.  EXAM: PORTABLE CHEST - 1 VIEW  COMPARISON:  Chest radiograph from 02/14/2013  FINDINGS: There is increased elevation of the right hemidiaphragm, with underlying atelectasis. Pulmonary vascularity is at the upper limits of normal. No pleural effusion or pneumothorax is seen.  The patient's right PICC is noted ending about the mid to distal SVC.  The cardiomediastinal silhouette is borderline normal in size, shifted to the left due to elevation of the right hemidiaphragm. No acute osseous abnormalities are identified.  IMPRESSION: 1. Right PICC noted ending about the mid to distal SVC. 2. Increased elevation of the right hemidiaphragm, with underlying atelectasis. Associated leftward shift of the mediastinum.   Electronically Signed   By: Garald Balding M.D.   On: 04/11/2015 20:38  Scheduled Meds: . amLODipine  5 mg Oral Daily  . antiseptic oral rinse  7 mL Mouth Rinse BID  . aspirin EC  81 mg Oral Daily  . calcium acetate  667 mg Oral TID WC  . ciprofloxacin  400 mg Intravenous Q12H  . DULoxetine  30 mg Oral q morning - 10a  . feeding supplement (RESOURCE BREEZE)  1 Container Oral BID BM  . gabapentin  300 mg Oral TID  . heparin  5,000 Units Subcutaneous 3 times per day  . ipratropium-albuterol  3 mL Nebulization BID  . levETIRAcetam  500 mg Oral BID  . metoprolol  50 mg Oral BID  . metronidazole  500 mg Intravenous Q8H  . pantoprazole  40 mg Oral Daily  . QUEtiapine  50 mg Oral QHS  . simvastatin  10 mg Oral Daily  . sodium chloride  10-40 mL Intracatheter Q12H  . sodium chloride  3 mL Intravenous Q12H   Continuous Infusions: . sodium chloride  75 mL/hr at 04/12/15 0849    Active Problems:   Vomiting   Metabolic acidosis   Diarrhea   Acute on chronic kidney failure   Nausea vomiting and diarrhea    Time spent: 25 min    Kelvin Cellar  [facility] Pager [phone] 7PM-7AM, please contact night-coverage at [email or web address], password [facility] 04/12/2015, 9:02 AM  LOS: 1 day

## 2015-04-13 LAB — COMPREHENSIVE METABOLIC PANEL
ALT: 20 U/L (ref 14–54)
AST: 14 U/L — ABNORMAL LOW (ref 15–41)
Albumin: 2.8 g/dL — ABNORMAL LOW (ref 3.5–5.0)
Alkaline Phosphatase: 88 U/L (ref 38–126)
Anion gap: 4 — ABNORMAL LOW (ref 5–15)
BUN: 25 mg/dL — ABNORMAL HIGH (ref 6–20)
CO2: 24 mmol/L (ref 22–32)
Calcium: 8.2 mg/dL — ABNORMAL LOW (ref 8.9–10.3)
Chloride: 114 mmol/L — ABNORMAL HIGH (ref 101–111)
Creatinine, Ser: 1.77 mg/dL — ABNORMAL HIGH (ref 0.44–1.00)
GFR calc Af Amer: 34 mL/min — ABNORMAL LOW (ref >60)
GFR calc non Af Amer: 29 mL/min — ABNORMAL LOW (ref >60)
Glucose, Bld: 123 mg/dL — ABNORMAL HIGH (ref 65–99)
Potassium: 4.5 mmol/L (ref 3.5–5.1)
Sodium: 142 mmol/L (ref 135–145)
Total Bilirubin: 0.2 mg/dL — ABNORMAL LOW (ref 0.3–1.2)
Total Protein: 5.8 g/dL — ABNORMAL LOW (ref 6.5–8.1)

## 2015-04-13 LAB — BASIC METABOLIC PANEL
Anion gap: 5 (ref 5–15)
BUN: 25 mg/dL — ABNORMAL HIGH (ref 6–20)
CO2: 24 mmol/L (ref 22–32)
Calcium: 8.2 mg/dL — ABNORMAL LOW (ref 8.9–10.3)
Chloride: 115 mmol/L — ABNORMAL HIGH (ref 101–111)
Creatinine, Ser: 1.81 mg/dL — ABNORMAL HIGH (ref 0.44–1.00)
GFR calc Af Amer: 33 mL/min — ABNORMAL LOW (ref >60)
GFR calc non Af Amer: 29 mL/min — ABNORMAL LOW (ref >60)
Glucose, Bld: 104 mg/dL — ABNORMAL HIGH (ref 65–99)
Potassium: 4.6 mmol/L (ref 3.5–5.1)
Sodium: 144 mmol/L (ref 135–145)

## 2015-04-13 LAB — CBC
HCT: 34.3 % — ABNORMAL LOW (ref 36.0–46.0)
Hemoglobin: 10.6 g/dL — ABNORMAL LOW (ref 12.0–15.0)
MCH: 30.8 pg (ref 26.0–34.0)
MCHC: 30.9 g/dL (ref 30.0–36.0)
MCV: 99.7 fL (ref 78.0–100.0)
Platelets: 354 10*3/uL (ref 150–400)
RBC: 3.44 MIL/uL — ABNORMAL LOW (ref 3.87–5.11)
RDW: 13.8 % (ref 11.5–15.5)
WBC: 8.5 10*3/uL (ref 4.0–10.5)

## 2015-04-13 MED ORDER — ONDANSETRON 4 MG PO TBDP: 4.0000 mg | ORAL_TABLET | Freq: Three times a day (TID) | ORAL | Status: DC | PRN

## 2015-04-13 NOTE — Clinical Documentation Improvement (Signed)
Registered dietitian documents - "Pt meets criteria for SEVERE MALNUTRITION in the context of ACUTE ILLNESS as evidenced by Eating less than 50% estimated needs for >5 days and loss of 4% bw in 2 weeks.  DOCUMENTATION CODES Per approved criteria  -Severe malnutrition in the context of acute illness or injury -Obesity (unspecified) "  Would you please document if there is agreement with RD documentation?  Thank You, Margretta Sidle ,RN Clinical Documentation Specialist:    [place] [facility] [phone] Cell - [phone]

## 2015-04-13 NOTE — Progress Notes (Signed)
Patient discharged with instructions, prescription, and care notes.  Verbalized understanding via teach back.  IV was removed and the site was WNL. Patient voiced no further complaints or concerns at the time of discharge.  Appointments scheduled per instructions.  Patient left the floor via w/c with staff and family in stable condition.  Notified MD due to patients request for Zofran prescription for home.  MD called medication in to her pharmacy.  Patient was notified of the prescription prior to drop off.

## 2015-04-13 NOTE — Discharge Summary (Addendum)
Physician Discharge Summary  [name] [id number] DOB: 1951-04-08 DOA: 04/10/2015  PCP: Chevis Pretty, FNP  Admit date: 04/10/2015 Discharge date: 04/24/2015  Time spent: 35 minutes  Recommendations for Outpatient Follow-up:  1. Please follow up on BMP and CBC on hospital follow up 2. Prior to discharge she was set up with home health services for home PT  Discharge Diagnoses:  Active Problems:   Vomiting   Metabolic acidosis   Diarrhea   Acute on chronic kidney failure   Nausea vomiting and diarrhea   Protein-calorie malnutrition, severe   Discharge Condition: Stable/Improved  Diet recommendation: Heart Healthy  Filed Weights   04/11/15 0107  Weight: 99.61 kg (219 lb 9.6 oz)    History of present illness:  This is a 64 y.o. year old female with significant past medical history of HTN, renal insufficiency, seizure disorder presenting with intractable nausea and vomiting, diarrhea, metabolic acidosis. Pt states that she has had persistent nausea and vomiting for the past week. Subjective fevers and chills. Also with watery diarrhea. Mild abdominal pain. No known sick contacts. Has had very little by mouth intake secondary to symptoms. Presented to the ER afebrile, hemodynamically stable. CBC within normal lives. There is some initial chemistry irregularities on first BMP and follow-up i-STAT. Second BMP consistent with the first. Predominant finding is a creatinine around 2.3, bicarbonate 17. Lactic acid within normal limits.  Hospital Course:  Patient is a pleasant 64 year old female with a past medical history of MGUS, GERD, hypertension, history of seizures, admitted to medicine service on 04/10/2015 presenting with complaints of nausea vomiting associated with diarrhea. Workup included a CT scan of abdomen and pelvis without IV contrast which showed diverticulosis of sigmoid colon, no evidence of bowel obstruction or inflammation. Symptoms attributed to  infectious source. patient was started on IV fluid resuscitation along with empiric IV antimicrobial therapy with ciprofloxacin and Flagyl. She showed clinical improvement and by 04/13/2015 she was tolerating PO intake. Labs showed resolution to acute on chronic renal failure. She was discharged home on home health PT.   1. Nausea/vomiting/diarrhea -Patient presenting with complaints of multiple episodes of nausea/vomiting and diarrhea, presenting dehydrated with electrolyte abnormalities. She was worked up with a CT scan of abdomen and pelvis which not reveal acute abdominal pathology. It is possible symptoms may be related to infectious gastroenteritis/viral syndrome.  -Stool for C.diff coming back negative.  -By 04/13/2015 she reported feeling much better and was tolerating PO intake.    2. Metabolic acidosis  - suspect related to GI illness, initial lab work showed bicarbonate of 14 -resolved with am labs showing bicarb of 24  3. Stage III/IV chronic kidney disease, acute on chronic renal failure -Patient having baseline creatinine near 1.5 - 1.8, with initial labs revealing Creatinine of 2.78. -A related to prerenal azotemia/hypovolemia resulted from GI losses -Am labs showing creatinine of 1.8 which appears to be at her baseline.    Discharge Exam: Filed Vitals:   04/13/15 0539  BP: 133/49  Pulse: 76  Temp: 98.1 F (36.7 C)  Resp: 18    General: Patient appears better, she ambulated with me down the hallway Cardiovascular: RRR normal S1S2, no edema Respiratory: normal inspiratory effort Abdomen: soft nontender nondistended  Discharge Instructions   Discharge Instructions    Call MD for:  difficulty breathing, headache or visual disturbances    Complete by:  As directed      Call MD for:  extreme fatigue    Complete by:  As directed  Call MD for:  hives    Complete by:  As directed      Call MD for:  persistant dizziness or light-headedness    Complete by:  As  directed      Call MD for:  persistant nausea and vomiting    Complete by:  As directed      Call MD for:  redness, tenderness, or signs of infection (pain, swelling, redness, odor or green/yellow discharge around incision site)    Complete by:  As directed      Call MD for:  severe uncontrolled pain    Complete by:  As directed      Call MD for:  temperature >100.4    Complete by:  As directed      Diet - low sodium heart healthy    Complete by:  As directed      Increase activity slowly    Complete by:  As directed           Discharge Medication List as of 04/13/2015 10:02 AM    CONTINUE these medications which have NOT CHANGED   Details  amLODipine (NORVASC) 5 MG tablet Take 1 tablet (5 mg total) by mouth daily., Starting 03/13/2015, Until Sat 03/12/16, Normal    aspirin EC 81 MG tablet Take 81 mg by mouth daily., Until Discontinued, Historical Med    calcium acetate (PHOSLO) 667 MG capsule Take 1 capsule by mouth 3 (three) times daily., Starting 03/12/2015, Until Discontinued, Historical Med    cholecalciferol (VITAMIN D) 1000 UNITS tablet Take 1,000 Units by mouth daily., Until Discontinued, Historical Med    cloNIDine (CATAPRES - DOSED IN MG/24 HR) 0.3 mg/24hr patch Place 1 patch (0.3 mg total) onto the skin once a week., Starting 03/13/2015, Until Discontinued, Normal    DULoxetine (CYMBALTA) 30 MG capsule Take 1 capsule (30 mg total) by mouth every morning., Starting 03/13/2015, Until Discontinued, Normal    ferrous sulfate 325 (65 FE) MG tablet Take 325 mg by mouth daily with breakfast., Until Discontinued, Historical Med    gabapentin (NEURONTIN) 300 MG capsule Take 1 capsule (300 mg total) by mouth 3 (three) times daily., Starting 03/13/2015, Until Discontinued, Normal    Ipratropium-Albuterol (COMBIVENT RESPIMAT) 20-100 MCG/ACT AERS respimat 2 puffs twice a day, Normal    levETIRAcetam (KEPPRA) 500 MG tablet TAKE 1 TABLET BY MOUTH TWICE DAILY, Normal    lidocaine  (XYLOCAINE) 4 % external solution Apply topically as needed for mild pain or moderate pain (for headache pain). , Starting 05/08/2014, Until Discontinued, Historical Med    LORazepam (ATIVAN) 1 MG tablet Take 1 tablet (1 mg total) by mouth 2 (two) times daily., Starting 04/07/2015, Until Discontinued, Phone In    losartan (COZAAR) 25 MG tablet Take 1 tablet (25 mg total) by mouth daily., Starting 03/13/2015, Until Discontinued, Normal    metoprolol (LOPRESSOR) 50 MG tablet Take 1 tablet (50 mg total) by mouth 2 (two) times daily., Starting 03/13/2015, Until Discontinued, Normal    pantoprazole (PROTONIX) 40 MG tablet Take 1 tablet (40 mg total) by mouth daily., Starting 03/13/2015, Until Discontinued, Normal    QUEtiapine (SEROQUEL) 50 MG tablet Take 50 mg by mouth at bedtime., Starting 03/25/2015, Until Fri 04/24/15, Historical Med    simvastatin (ZOCOR) 10 MG tablet TAKE 1 TABLET BY MOUTH ONCE DAILY, Normal      STOP taking these medications     meloxicam (MOBIC) 15 MG tablet      zolpidem (AMBIEN) 10 MG tablet  Allergies  Allergen Reactions  . Asa [Aspirin]     High dose Aspirin causes itching but can take 81mg    . Penicillins Hives  . Sulfonamide Derivatives Hives  . Tylenol With Codeine #3 [Acetaminophen-Codeine] Itching   Follow-up Information    Follow up with Chevis Pretty, FNP On 04/22/2015.   Specialty:  Nurse Practitioner   Why:  at 8:00 am   Contact information:   [place] [address] [phone]       Follow up with [facility].   Contact information:   [address] [place] [address] [phone]        The results of significant diagnostics from this hospitalization (including imaging, microbiology, ancillary and laboratory) are listed below for reference.    Significant Diagnostic Studies: Ct Abdomen Pelvis Wo Contrast  04/11/2015   CLINICAL DATA:  Intractable nausea and vomiting, diarrhea,  bone about like acidosis. Fever and chills.  EXAM: CT ABDOMEN AND PELVIS WITHOUT CONTRAST  TECHNIQUE: Multidetector CT imaging of the abdomen and pelvis was performed following the standard protocol without IV contrast.  COMPARISON:  None.  FINDINGS: Atelectasis in the lung bases. Calcified lymph nodes in the mediastinum.  Elevation of the right hemidiaphragm. Surgical absence of the gallbladder. Calcified granulomas in the spleen. Duodenum diverticulum. The unenhanced appearance of the liver, pancreas, adrenal glands, kidneys, abdominal aorta, inferior vena cava, and retroperitoneal lymph nodes is unremarkable. Stomach, small bowel, and colon are decompressed. Contrast material flows through to the rectum without evidence of large or small bowel obstruction. Lipoma at the ileocecal valve.No free air or free fluid in the abdomen.  Pelvis: Diverticulosis of the sigmoid colon without evidence of diverticulitis. No free or loculated pelvic fluid collections. Bladder wall is not thickened. Uterus appears surgically absent. No pelvic mass or degenerative changes in the spine and shoulders. No destructive bone lesions appreciated. Lymphadenopathy.  IMPRESSION: Elevation of the right hemidiaphragm with atelectasis in the lung bases. Calcified granulomas in the spleen. Duodenum diverticulum. Lipoma in the ileocecal valve. Diverticulosis of the sigmoid colon. No evidence of bowel obstruction or inflammation.   Electronically Signed   By: Lucienne Capers M.D.   On: 04/11/2015 05:55   Dg Chest Port 1 View  04/11/2015   CLINICAL DATA:  Central line placement.  Initial encounter.  EXAM: PORTABLE CHEST - 1 VIEW  COMPARISON:  Chest radiograph from 02/14/2013  FINDINGS: There is increased elevation of the right hemidiaphragm, with underlying atelectasis. Pulmonary vascularity is at the upper limits of normal. No pleural effusion or pneumothorax is seen.  The patient's right PICC is noted ending about the mid to distal SVC.  The  cardiomediastinal silhouette is borderline normal in size, shifted to the left due to elevation of the right hemidiaphragm. No acute osseous abnormalities are identified.  IMPRESSION: 1. Right PICC noted ending about the mid to distal SVC. 2. Increased elevation of the right hemidiaphragm, with underlying atelectasis. Associated leftward shift of the mediastinum.   Electronically Signed   By: Garald Balding M.D.   On: 04/11/2015 20:38    Microbiology: No results found for this or any previous visit (from the past 240 hour(s)).   Labs: Basic Metabolic Panel: No results for input(s): NA, K, CL, CO2, GLUCOSE, BUN, CREATININE, CALCIUM, MG, PHOS in the last 168 hours. Liver Function Tests: No results for input(s): AST, ALT, ALKPHOS, BILITOT, PROT, ALBUMIN in the last 168 hours. No results for input(s): LIPASE, AMYLASE in the last 168  hours. No results for input(s): AMMONIA in the last 168 hours. CBC: No results for input(s): WBC, NEUTROABS, HGB, HCT, MCV, PLT in the last 168 hours. Cardiac Enzymes: No results for input(s): CKTOTAL, CKMB, CKMBINDEX, TROPONINI in the last 168 hours. BNP: BNP (last 3 results) No results for input(s): BNP in the last 8760 hours.  ProBNP (last 3 results) No results for input(s): PROBNP in the last 8760 hours.  CBG: No results for input(s): GLUCAP in the last 168 hours.     SignedKelvin Cellar  [facility] 04/24/2015, 4:40 PM

## 2015-04-13 NOTE — Care Management Note (Signed)
Case Management Note  Patient Details  Name: [name] MRN: [id number] Date of Birth: July 22, 1951  Subjective/Objective:                  Pt admitted from home with nausea and vomiting. Pt lives with family and will return home at discharge. Pt does use a quad cane for home use. Pt is active with [facility] PT and would like to continue those services at discharge.  Action/Plan: Resumption of [facility] PT arranged per pts choice. Romualdo Bolk of [facility] is aware and will collect the pts information from the chart. [facility] services to resume within 48 hours of discharge. Pt also requested a new rolling walker. Script given to pts son who stated he will go by [facility] office in [place] to pick up walker. Pt and pts nurse aware of discharge arrangements.  Expected Discharge Date:                  Expected Discharge Plan:  [place]  In-House Referral:  NA  Discharge planning Services  CM Consult  Post Acute Care Choice:  Resumption of Svcs/PTA Provider Choice offered to:  Patient  DME Arranged:    DME Agency:     HH Arranged:  PT [place]:  [place]  Status of Service:  Completed, signed off  Medicare Important Message Given:  N/A - LOS <3 / Initial given by admissions Date Medicare IM Given:    Medicare IM give by:    Date Additional Medicare IM Given:    Additional Medicare Important Message give by:     If discussed at [place] of Stay Meetings, dates discussed:    Additional Comments:  Joylene Draft, RN 04/13/2015, 10:17 AM

## 2015-04-14 LAB — OVA AND PARASITE EXAMINATION: Ova and parasites: NONE SEEN

## 2015-04-14 LAB — FECAL LACTOFERRIN, QUANT: Fecal Lactoferrin: POSITIVE

## 2015-04-15 LAB — STOOL CULTURE

## 2015-04-16 ENCOUNTER — Encounter: Payer: Self-pay | Admitting: *Deleted

## 2015-04-22 ENCOUNTER — Ambulatory Visit: Payer: Medicare Other | Admitting: Nurse Practitioner

## 2015-04-24 DIAGNOSIS — E43 Unspecified severe protein-calorie malnutrition: Secondary | ICD-10-CM | POA: Diagnosis present

## 2015-05-06 DIAGNOSIS — I10 Essential (primary) hypertension: Secondary | ICD-10-CM | POA: Diagnosis not present

## 2015-05-06 DIAGNOSIS — E872 Acidosis: Secondary | ICD-10-CM | POA: Diagnosis not present

## 2015-05-06 DIAGNOSIS — N183 Chronic kidney disease, stage 3 (moderate): Secondary | ICD-10-CM | POA: Diagnosis not present

## 2015-05-06 DIAGNOSIS — Z79899 Other long term (current) drug therapy: Secondary | ICD-10-CM | POA: Diagnosis not present

## 2015-05-06 DIAGNOSIS — N179 Acute kidney failure, unspecified: Secondary | ICD-10-CM | POA: Diagnosis not present

## 2015-05-18 ENCOUNTER — Other Ambulatory Visit (HOSPITAL_COMMUNITY): Payer: Self-pay | Admitting: Oncology

## 2015-05-18 DIAGNOSIS — D472 Monoclonal gammopathy: Secondary | ICD-10-CM

## 2015-05-21 ENCOUNTER — Ambulatory Visit (HOSPITAL_COMMUNITY)
Admission: RE | Admit: 2015-05-21 | Discharge: 2015-05-21 | Disposition: A | Discharge status: Home / Self Care | Payer: Medicare Other | Source: Ambulatory Visit | Attending: Hematology & Oncology | Admitting: Hematology & Oncology

## 2015-05-21 DIAGNOSIS — D472 Monoclonal gammopathy: Secondary | ICD-10-CM | POA: Insufficient documentation

## 2015-05-26 ENCOUNTER — Other Ambulatory Visit: Payer: Self-pay | Admitting: Nurse Practitioner

## 2015-05-26 NOTE — Telephone Encounter (Signed)
Please call in ambien with 1 refills 

## 2015-05-26 NOTE — Telephone Encounter (Signed)
Last seen 03/13/15 MMM If approved route to nurse to call into [facility]  [phone]

## 2015-05-26 NOTE — Telephone Encounter (Signed)
Refill called to pharmacy.

## 2015-05-27 NOTE — Telephone Encounter (Signed)
Please review and advise.

## 2015-05-28 ENCOUNTER — Ambulatory Visit (INDEPENDENT_AMBULATORY_CARE_PROVIDER_SITE_OTHER): Payer: Medicare Other | Admitting: Nurse Practitioner

## 2015-05-28 ENCOUNTER — Emergency Department (HOSPITAL_COMMUNITY): Payer: Medicare Other

## 2015-05-28 ENCOUNTER — Encounter: Payer: Self-pay | Admitting: Nurse Practitioner

## 2015-05-28 ENCOUNTER — Encounter (HOSPITAL_COMMUNITY): Payer: Self-pay | Admitting: Emergency Medicine

## 2015-05-28 ENCOUNTER — Inpatient Hospital Stay (HOSPITAL_COMMUNITY)
Admission: EM | Admit: 2015-05-28 | Discharge: 2015-05-31 | DRG: 682 | Disposition: A | Discharge status: Home Health | Payer: Medicare Other | Attending: Internal Medicine | Admitting: Internal Medicine

## 2015-05-28 VITALS — BP 116/66 | HR 102 | Temp 97.4°F | Ht 66.0 in | Wt 211.0 lb

## 2015-05-28 DIAGNOSIS — Z7409 Other reduced mobility: Secondary | ICD-10-CM | POA: Diagnosis not present

## 2015-05-28 DIAGNOSIS — Z6834 Body mass index (BMI) 34.0-34.9, adult: Secondary | ICD-10-CM

## 2015-05-28 DIAGNOSIS — Z833 Family history of diabetes mellitus: Secondary | ICD-10-CM

## 2015-05-28 DIAGNOSIS — R0902 Hypoxemia: Secondary | ICD-10-CM | POA: Diagnosis present

## 2015-05-28 DIAGNOSIS — G4730 Sleep apnea, unspecified: Secondary | ICD-10-CM

## 2015-05-28 DIAGNOSIS — R079 Chest pain, unspecified: Secondary | ICD-10-CM | POA: Diagnosis not present

## 2015-05-28 DIAGNOSIS — R197 Diarrhea, unspecified: Secondary | ICD-10-CM | POA: Diagnosis not present

## 2015-05-28 DIAGNOSIS — J986 Disorders of diaphragm: Secondary | ICD-10-CM | POA: Diagnosis not present

## 2015-05-28 DIAGNOSIS — R413 Other amnesia: Secondary | ICD-10-CM

## 2015-05-28 DIAGNOSIS — Z8249 Family history of ischemic heart disease and other diseases of the circulatory system: Secondary | ICD-10-CM

## 2015-05-28 DIAGNOSIS — I129 Hypertensive chronic kidney disease with stage 1 through stage 4 chronic kidney disease, or unspecified chronic kidney disease: Secondary | ICD-10-CM | POA: Diagnosis present

## 2015-05-28 DIAGNOSIS — T428X5A Adverse effect of antiparkinsonism drugs and other central muscle-tone depressants, initial encounter: Secondary | ICD-10-CM | POA: Diagnosis present

## 2015-05-28 DIAGNOSIS — N179 Acute kidney failure, unspecified: Secondary | ICD-10-CM | POA: Diagnosis present

## 2015-05-28 DIAGNOSIS — T426X5A Adverse effect of other antiepileptic and sedative-hypnotic drugs, initial encounter: Secondary | ICD-10-CM | POA: Diagnosis present

## 2015-05-28 DIAGNOSIS — I10 Essential (primary) hypertension: Secondary | ICD-10-CM

## 2015-05-28 DIAGNOSIS — R569 Unspecified convulsions: Secondary | ICD-10-CM | POA: Diagnosis not present

## 2015-05-28 DIAGNOSIS — F329 Major depressive disorder, single episode, unspecified: Secondary | ICD-10-CM | POA: Diagnosis present

## 2015-05-28 DIAGNOSIS — G9340 Encephalopathy, unspecified: Secondary | ICD-10-CM | POA: Diagnosis present

## 2015-05-28 DIAGNOSIS — G40909 Epilepsy, unspecified, not intractable, without status epilepticus: Secondary | ICD-10-CM | POA: Diagnosis present

## 2015-05-28 DIAGNOSIS — F32A Depression, unspecified: Secondary | ICD-10-CM | POA: Diagnosis present

## 2015-05-28 DIAGNOSIS — N183 Chronic kidney disease, stage 3 unspecified: Secondary | ICD-10-CM

## 2015-05-28 DIAGNOSIS — G0490 Encephalitis and encephalomyelitis, unspecified: Secondary | ICD-10-CM | POA: Diagnosis present

## 2015-05-28 DIAGNOSIS — G9341 Metabolic encephalopathy: Secondary | ICD-10-CM | POA: Diagnosis present

## 2015-05-28 DIAGNOSIS — R51 Headache: Secondary | ICD-10-CM | POA: Diagnosis not present

## 2015-05-28 DIAGNOSIS — E785 Hyperlipidemia, unspecified: Secondary | ICD-10-CM | POA: Diagnosis present

## 2015-05-28 DIAGNOSIS — T424X5A Adverse effect of benzodiazepines, initial encounter: Secondary | ICD-10-CM | POA: Diagnosis present

## 2015-05-28 DIAGNOSIS — R488 Other symbolic dysfunctions: Secondary | ICD-10-CM

## 2015-05-28 DIAGNOSIS — R519 Headache, unspecified: Secondary | ICD-10-CM

## 2015-05-28 DIAGNOSIS — R410 Disorientation, unspecified: Secondary | ICD-10-CM | POA: Diagnosis present

## 2015-05-28 DIAGNOSIS — Z87891 Personal history of nicotine dependence: Secondary | ICD-10-CM | POA: Diagnosis not present

## 2015-05-28 DIAGNOSIS — R0789 Other chest pain: Secondary | ICD-10-CM | POA: Diagnosis not present

## 2015-05-28 DIAGNOSIS — E440 Moderate protein-calorie malnutrition: Secondary | ICD-10-CM | POA: Diagnosis present

## 2015-05-28 DIAGNOSIS — D472 Monoclonal gammopathy: Secondary | ICD-10-CM | POA: Diagnosis present

## 2015-05-28 DIAGNOSIS — R0602 Shortness of breath: Secondary | ICD-10-CM | POA: Diagnosis not present

## 2015-05-28 DIAGNOSIS — R4182 Altered mental status, unspecified: Secondary | ICD-10-CM | POA: Diagnosis not present

## 2015-05-28 DIAGNOSIS — R482 Apraxia: Secondary | ICD-10-CM

## 2015-05-28 HISTORY — DX: Chronic kidney disease, stage 3 unspecified: N18.30

## 2015-05-28 HISTORY — DX: Chronic kidney disease, stage 3 (moderate): N18.3

## 2015-05-28 HISTORY — DX: Essential (primary) hypertension: I10

## 2015-05-28 HISTORY — DX: Other disorders of lung: J98.4

## 2015-05-28 HISTORY — DX: Hyperlipidemia, unspecified: E78.5

## 2015-05-28 LAB — URINALYSIS, ROUTINE W REFLEX MICROSCOPIC
Bilirubin Urine: NEGATIVE
Glucose, UA: NEGATIVE mg/dL
Hgb urine dipstick: NEGATIVE
Ketones, ur: NEGATIVE mg/dL
Leukocytes, UA: NEGATIVE
Nitrite: NEGATIVE
Protein, ur: NEGATIVE mg/dL
Specific Gravity, Urine: 1.02 (ref 1.005–1.030)
Urobilinogen, UA: 0.2 mg/dL (ref 0.0–1.0)
pH: 5 (ref 5.0–8.0)

## 2015-05-28 LAB — COMPREHENSIVE METABOLIC PANEL
ALT: 13 U/L — ABNORMAL LOW (ref 14–54)
AST: 16 U/L (ref 15–41)
Albumin: 4.2 g/dL (ref 3.5–5.0)
Alkaline Phosphatase: 81 U/L (ref 38–126)
Anion gap: 9 (ref 5–15)
BUN: 43 mg/dL — ABNORMAL HIGH (ref 6–20)
CO2: 17 mmol/L — ABNORMAL LOW (ref 22–32)
Calcium: 8.4 mg/dL — ABNORMAL LOW (ref 8.9–10.3)
Chloride: 113 mmol/L — ABNORMAL HIGH (ref 101–111)
Creatinine, Ser: 2.76 mg/dL — ABNORMAL HIGH (ref 0.44–1.00)
GFR calc Af Amer: 20 mL/min — ABNORMAL LOW (ref >60)
GFR calc non Af Amer: 17 mL/min — ABNORMAL LOW (ref >60)
Glucose, Bld: 95 mg/dL (ref 65–99)
Potassium: 4.7 mmol/L (ref 3.5–5.1)
Sodium: 139 mmol/L (ref 135–145)
Total Bilirubin: 0.3 mg/dL (ref 0.3–1.2)
Total Protein: 8 g/dL (ref 6.5–8.1)

## 2015-05-28 LAB — BLOOD GAS, ARTERIAL
Acid-base deficit: 7.5 mmol/L — ABNORMAL HIGH (ref 0.0–2.0)
Bicarbonate: 18.2 mEq/L — ABNORMAL LOW (ref 20.0–24.0)
Drawn by: 25788
O2 Saturation: 89.9 %
TCO2: 16.8 mmol/L (ref 0–100)
pCO2 arterial: 39.4 mmHg (ref 35.0–45.0)
pH, Arterial: 7.286 — ABNORMAL LOW (ref 7.350–7.450)
pO2, Arterial: 64.6 mmHg — ABNORMAL LOW (ref 80.0–100.0)

## 2015-05-28 LAB — CBC WITH DIFFERENTIAL/PLATELET
Basophils Absolute: 0.1 10*3/uL (ref 0.0–0.1)
Basophils Relative: 1 % (ref 0–1)
Eosinophils Absolute: 0.2 10*3/uL (ref 0.0–0.7)
Eosinophils Relative: 2 % (ref 0–5)
HCT: 44.7 % (ref 36.0–46.0)
Hemoglobin: 14 g/dL (ref 12.0–15.0)
Lymphocytes Relative: 27 % (ref 12–46)
Lymphs Abs: 2.1 10*3/uL (ref 0.7–4.0)
MCH: 32 pg (ref 26.0–34.0)
MCHC: 31.3 g/dL (ref 30.0–36.0)
MCV: 102.3 fL — ABNORMAL HIGH (ref 78.0–100.0)
Monocytes Absolute: 0.6 10*3/uL (ref 0.1–1.0)
Monocytes Relative: 7 % (ref 3–12)
Neutro Abs: 5 10*3/uL (ref 1.7–7.7)
Neutrophils Relative %: 63 % (ref 43–77)
Platelets: 332 10*3/uL (ref 150–400)
RBC: 4.37 MIL/uL (ref 3.87–5.11)
RDW: 13.9 % (ref 11.5–15.5)
WBC: 8 10*3/uL (ref 4.0–10.5)

## 2015-05-28 LAB — TROPONIN I: Troponin I: 0.08 ng/mL — ABNORMAL HIGH (ref <0.031)

## 2015-05-28 LAB — BRAIN NATRIURETIC PEPTIDE: B Natriuretic Peptide: 38 pg/mL (ref 0.0–100.0)

## 2015-05-28 LAB — SEDIMENTATION RATE: Sed Rate: 13 mm/hr (ref 0–22)

## 2015-05-28 LAB — I-STAT CG4 LACTIC ACID, ED: Lactic Acid, Venous: 2.36 mmol/L (ref 0.5–2.0)

## 2015-05-28 LAB — AMMONIA: Ammonia: 30 umol/L (ref 9–35)

## 2015-05-28 MED ORDER — ASPIRIN EC 81 MG PO TBEC
81.0000 mg | DELAYED_RELEASE_TABLET | Freq: Every day | ORAL | Status: DC
  Administered 2015-05-29 – 2015-05-31 (×3): 81 mg via ORAL
  Filled 2015-05-28 (×3): qty 1

## 2015-05-28 MED ORDER — CALCIUM ACETATE (PHOS BINDER) 667 MG PO CAPS
667.0000 mg | ORAL_CAPSULE | Freq: Three times a day (TID) | ORAL | Status: DC
  Administered 2015-05-29 – 2015-05-31 (×6): 667 mg via ORAL
  Filled 2015-05-28 (×6): qty 1

## 2015-05-28 MED ORDER — LEVETIRACETAM 500 MG PO TABS
500.0000 mg | ORAL_TABLET | Freq: Two times a day (BID) | ORAL | Status: DC
  Administered 2015-05-29 – 2015-05-31 (×6): 500 mg via ORAL
  Filled 2015-05-28 (×6): qty 1

## 2015-05-28 MED ORDER — ACETAMINOPHEN 325 MG PO TABS
650.0000 mg | ORAL_TABLET | Freq: Four times a day (QID) | ORAL | Status: DC | PRN
  Administered 2015-05-29 – 2015-05-31 (×3): 650 mg via ORAL
  Filled 2015-05-28 (×3): qty 2

## 2015-05-28 MED ORDER — SODIUM CHLORIDE 0.9 % IV SOLN
INTRAVENOUS | Status: DC
Start: 2015-05-28 — End: 2015-05-29
  Administered 2015-05-29: 01:00:00 via INTRAVENOUS
  Filled 2015-05-28 (×2): qty 1000

## 2015-05-28 MED ORDER — DULOXETINE HCL 60 MG PO CPEP
60.0000 mg | ORAL_CAPSULE | Freq: Every day | ORAL | Status: DC
  Administered 2015-05-29 – 2015-05-31 (×3): 60 mg via ORAL
  Filled 2015-05-28 (×3): qty 1

## 2015-05-28 MED ORDER — VITAMIN D 1000 UNITS PO TABS
1000.0000 [IU] | ORAL_TABLET | Freq: Every day | ORAL | Status: DC
  Administered 2015-05-29 – 2015-05-31 (×3): 1000 [IU] via ORAL
  Filled 2015-05-28 (×3): qty 1

## 2015-05-28 MED ORDER — AMLODIPINE BESYLATE 5 MG PO TABS
5.0000 mg | ORAL_TABLET | Freq: Every day | ORAL | Status: DC
  Administered 2015-05-29 – 2015-05-31 (×3): 5 mg via ORAL
  Filled 2015-05-28 (×3): qty 1

## 2015-05-28 MED ORDER — ONDANSETRON HCL 4 MG PO TABS: 4.0000 mg | ORAL_TABLET | Freq: Four times a day (QID) | ORAL | Status: DC | PRN

## 2015-05-28 MED ORDER — PANTOPRAZOLE SODIUM 40 MG PO TBEC
40.0000 mg | DELAYED_RELEASE_TABLET | Freq: Every day | ORAL | Status: DC
Start: 2015-05-29 — End: 2015-05-31
  Administered 2015-05-29 – 2015-05-31 (×3): 40 mg via ORAL
  Filled 2015-05-28 (×3): qty 1

## 2015-05-28 MED ORDER — ENOXAPARIN SODIUM 30 MG/0.3ML ~~LOC~~ SOLN
30.0000 mg | SUBCUTANEOUS | Status: DC
  Administered 2015-05-29: 30 mg via SUBCUTANEOUS
  Filled 2015-05-28: qty 0.3

## 2015-05-28 MED ORDER — ONDANSETRON HCL 4 MG/2ML IJ SOLN: 4.0000 mg | Freq: Four times a day (QID) | INTRAMUSCULAR | Status: DC | PRN

## 2015-05-28 MED ORDER — METOPROLOL TARTRATE 50 MG PO TABS
50.0000 mg | ORAL_TABLET | Freq: Two times a day (BID) | ORAL | Status: DC
  Administered 2015-05-29 – 2015-05-31 (×6): 50 mg via ORAL
  Filled 2015-05-28 (×6): qty 1

## 2015-05-28 MED ORDER — CLONIDINE HCL 0.3 MG/24HR TD PTWK
0.3000 mg | MEDICATED_PATCH | TRANSDERMAL | Status: DC
  Filled 2015-05-28: qty 1

## 2015-05-28 MED ORDER — ACETAMINOPHEN 650 MG RE SUPP: 650.0000 mg | Freq: Four times a day (QID) | RECTAL | Status: DC | PRN

## 2015-05-28 MED ORDER — TRAMADOL HCL 50 MG PO TABS
ORAL_TABLET | ORAL | Status: AC
  Filled 2015-05-28: qty 1

## 2015-05-28 MED ORDER — SODIUM CHLORIDE 0.9 % IJ SOLN
3.0000 mL | Freq: Two times a day (BID) | INTRAMUSCULAR | Status: DC
  Administered 2015-05-29 – 2015-05-30 (×2): 3 mL via INTRAVENOUS

## 2015-05-28 MED ORDER — IPRATROPIUM-ALBUTEROL 20-100 MCG/ACT IN AERS: 2.0000 | INHALATION_SPRAY | Freq: Two times a day (BID) | RESPIRATORY_TRACT | Status: DC

## 2015-05-28 MED ORDER — CALCITRIOL 0.25 MCG PO CAPS
0.5000 ug | ORAL_CAPSULE | Freq: Every day | ORAL | Status: DC
Start: 2015-05-29 — End: 2015-05-31
  Administered 2015-05-29 – 2015-05-31 (×3): 0.5 ug via ORAL
  Filled 2015-05-28 (×3): qty 2

## 2015-05-28 MED ORDER — FERROUS SULFATE 325 (65 FE) MG PO TABS
325.0000 mg | ORAL_TABLET | Freq: Every day | ORAL | Status: DC
  Administered 2015-05-29 – 2015-05-31 (×3): 325 mg via ORAL
  Filled 2015-05-28 (×4): qty 1

## 2015-05-28 MED ORDER — TRAMADOL HCL 50 MG PO TABS
25.0000 mg | ORAL_TABLET | Freq: Once | ORAL | Status: AC
  Administered 2015-05-28: 25 mg via ORAL

## 2015-05-28 NOTE — Patient Instructions (Signed)

## 2015-05-28 NOTE — ED Notes (Signed)
Jeneen Rinks, son can be reached at [phone]

## 2015-05-28 NOTE — Progress Notes (Signed)
   Subjective:    Patient ID: [name], female    DOB: Oct 13, 1951, 64 y.o.   MRN: [id number]  HPI Patient brought in by son with c/o balance off, memory loss and frequent stuttering. This started about in May and her son says that it has gotten worse. She fell yesterday walking to neighbors house- she was actually in her nightgown when she walked over there. She has been very forgetful the last  Week. Was on seroquel for psych problems but has not had since May. She has a seizure disorder but has not had seizure since march 2016. Wants to sleep a lot.  * also wants sleep study doen because son says taht she gasps for air at night * Trouble with hearing   Review of Systems  Constitutional: Negative.   Respiratory: Negative.   Cardiovascular: Negative.   Gastrointestinal: Negative.   Genitourinary: Negative.   Musculoskeletal: Negative.   Neurological: Positive for speech difficulty and headaches (nothing differnet for her). Negative for dizziness.  Hematological: Negative.   Psychiatric/Behavioral: Negative.   All other systems reviewed and are negative.      Objective:   Physical Exam  Constitutional: She is oriented to person, place, and time. She appears well-developed and well-nourished.  Cardiovascular: Normal rate, regular rhythm and normal heart sounds.   Pulmonary/Chest: Breath sounds normal.  Musculoskeletal:  Grips equal bilateraly.  Neurological: She is alert and oriented to person, place, and time. No cranial nerve deficit.  Skin: Skin is warm.  Psychiatric:  Speech stuttery- difficult to form words Takes a minute for her thought processes to go through to speak    mini-mental status exam\- 23/30  BP 116/66 mmHg  Pulse 102  Temp(Src) 97.4 F (36.3 C) (Oral)  Ht 5\' 6"  (1.676 m)  Wt 211 lb (95.709 kg)  BMI 34.07 kg/m2     Assessment & Plan:   1. Speech apraxia   2. Impaired functional mobility, balance, gait, and endurance   3. Memory loss of  unknown cause    Referral to neurologist

## 2015-05-28 NOTE — ED Notes (Signed)
Having confusion for last 7 days per son.  C/o headache of  Last 2 weeks per pt.  Seen by Dr Hassell Done and sent to ED for evaluation.

## 2015-05-28 NOTE — ED Notes (Signed)
Nurse made x1 unsuccessful attempt at IV. Second nurse made aware.

## 2015-05-28 NOTE — H&P (Signed)
History and Physical  [name] [id number] DOB: 1951-09-08 DOA: 05/28/2015   PCP: Chevis Pretty, FNP  Referring Physician: ED/ Dr. Gayland Curry  Chief Complaint: Confusion, lethargy  HPI:  64 year old female with a history of MGUS, restrictive lung disease, hypertension, seizure, depression, CKD stage III, hyperlipidemia, hypertension presents with one-week history of increasing confusion and lethargy. The patient's family states that she has been "talking out of her head". In addition, the patient locked herself out of her house yesterday. The patient has had intermittent chest discomfort mostly at rest, but also with exertion. She is also having some shortness of breath mostly with activity. The patient has had a workup for shortness of breath in the past and has seen a pulmonologist. It was felt that her dyspnea was multifactorial including restrictive lung disease in the setting of OHS and paralyzed right hemidiaphragm. In addition, the patient has had generalized weakness and unsteady gait resulting in mechanical falls 2 this past week. She denies any syncope, dizziness, visual disturbance, focal extremity weakness. She has had loose stools for the past 2-3 days without any hematochezia or melena. She has some nausea without vomiting. According to the patient's son at the bedside. The patient has had a gradual functional as well as cognitive decline over the past year. The patient had a recent admission to [facility] for acute encephalopathy for which she had a workup.  It was felt that her encephalopathy was multifactorial including her medications and metabolic. In the emergency department, the patient was afebrile and hemodynamically stable. Oxygen saturation was 98-100 percent on room air. CBC was unremarkable. BMP showed serum creatinine 2.76. Hepatic enzymes were negative. Lactic acid was 2.36. Urinalysis was negative. Troponin was 0.08.  Assessment/Plan: Acute  encephalopathy -Multifactorial including multiple hypnotic medications, acute on chronic renal failure, hypoxemia, and possible hypertensive encephalopathy. -MRI brain without contrast -Check ammonia -TSH B12, RBC folate - RPR, HIV -Discontinue hypnotic medications including baclofen, Seroquel, Ambien, Ativan, gabapentin Atypical chest pain -Reproducible on examination -EKG sinus rhythm with nonspecific T-wave changes -Cycle troponins Acute on chronic renal failure (CKD stage 3) -Baseline creatinine 1.7-1.8 -Volume depletion a setting of NSAID use  -discontinue Mobic -IV fluids  -Discontinue losartan Diarrhea  -No recent antibiotics  -C. difficile PCR  -Suspect the patient has underlying irritable bowel syndrome  hypertension -Hold losartan -Continue metoprolol tartrate, amlodipine, clonidine TTS 3  Depression  -Continue Cymbalta  Seizure disorder  -Continue Keppra        Past Medical History  Diagnosis Date  . Seizures   . Chronic headaches   . Hypertension   . Osteoporosis   . Depression   . 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 URI (upper respiratory infection) 08/2014    early October  . Renal insufficiency    Past Surgical History  Procedure Laterality Date  . Breast reduction surgery    . Abdominal hysterectomy    . Lung lobectomy      rt lung-cyst   . Appendectomy    . Cholecystectomy    . Back surgery  2002   Social History:  reports that she quit smoking about 2 years ago. Her smoking use included Cigarettes. She has a 30 pack-year smoking history. She has never used smokeless tobacco. She reports that she does not drink alcohol or use illicit drugs.   Family History  Problem Relation Age of Onset  . Hypertension Mother   . Heart disease Mother   . Diabetes Mother   .  Kidney disease Mother   . Arthritis Mother   . Hypertension Father      Allergies  Allergen Reactions  . Asa [Aspirin]     High dose Aspirin  causes itching but can take 81mg    . Penicillins Hives  . Sulfonamide Derivatives Hives  . Tylenol With Codeine #3 [Acetaminophen-Codeine] Itching      Prior to Admission medications   Medication Sig Start Date End Date Taking? Authorizing Provider  baclofen (LIORESAL) 10 MG tablet Take 10 mg by mouth 3 (three) times daily as needed for muscle spasms.  05/25/15  Yes Historical Provider, MD  calcitRIOL (ROCALTROL) 0.5 MCG capsule Take 0.5 mcg by mouth daily.  04/24/15  Yes Historical Provider, MD  DULoxetine (CYMBALTA) 60 MG capsule Take 60 mg by mouth daily.   Yes Historical Provider, MD  levETIRAcetam (KEPPRA) 500 MG tablet TAKE 1 TABLET BY MOUTH TWICE DAILY 04/01/15  Yes Mary-Margaret Hassell Done, FNP  LORazepam (ATIVAN) 1 MG tablet Take 1 tablet (1 mg total) by mouth 2 (two) times daily. 04/07/15  Yes Mary-Margaret Hassell Done, FNP  losartan (COZAAR) 25 MG tablet Take 1 tablet (25 mg total) by mouth daily. 03/13/15  Yes Mary-Margaret Hassell Done, FNP  meloxicam (MOBIC) 15 MG tablet TAKE 1 TABLET BY MOUTH ONCE DAILY 05/27/15  Yes Mary-Margaret Hassell Done, FNP  metoprolol (LOPRESSOR) 50 MG tablet Take 1 tablet (50 mg total) by mouth 2 (two) times daily. 03/13/15  Yes Mary-Margaret Hassell Done, FNP  ondansetron (ZOFRAN ODT) 4 MG disintegrating tablet Take 1 tablet (4 mg total) by mouth every 8 (eight) hours as needed for nausea or vomiting. 04/13/15  Yes Kelvin Cellar, MD  pantoprazole (PROTONIX) 40 MG tablet Take 1 tablet (40 mg total) by mouth daily. 03/13/15  Yes Mary-Margaret Hassell Done, FNP  simvastatin (ZOCOR) 10 MG tablet TAKE 1 TABLET BY MOUTH ONCE DAILY 08/13/14  Yes Lysbeth Penner, FNP  zolpidem (AMBIEN) 10 MG tablet TAKE 1 TABLET BY MOUTH AT BEDTIME AS NEEDED FOR SLEEP 05/26/15  Yes Mary-Margaret Hassell Done, FNP  amLODipine (NORVASC) 5 MG tablet Take 1 tablet (5 mg total) by mouth daily. 03/13/15 03/12/16  Mary-Margaret Hassell Done, FNP  aspirin EC 81 MG tablet Take 81 mg by mouth daily.    Historical Provider, MD  calcium  acetate (PHOSLO) 667 MG capsule Take 1 capsule by mouth 3 (three) times daily. 03/12/15   Historical Provider, MD  cholecalciferol (VITAMIN D) 1000 UNITS tablet Take 1,000 Units by mouth daily.    Historical Provider, MD  cloNIDine (CATAPRES - DOSED IN MG/24 HR) 0.3 mg/24hr patch Place 1 patch (0.3 mg total) onto the skin once a week. 03/13/15   Mary-Margaret Hassell Done, FNP  ferrous sulfate 325 (65 FE) MG tablet Take 325 mg by mouth daily with breakfast.    Historical Provider, MD  gabapentin (NEURONTIN) 300 MG capsule Take 1 capsule (300 mg total) by mouth 3 (three) times daily. 03/13/15   Mary-Margaret Hassell Done, FNP  Ipratropium-Albuterol (COMBIVENT RESPIMAT) 20-100 MCG/ACT AERS respimat 2 puffs twice a day Patient taking differently: Inhale 2 puffs into the lungs 2 (two) times daily.  05/09/14   Lysbeth Penner, FNP  lidocaine (XYLOCAINE) 4 % external solution Apply topically as needed for mild pain or moderate pain (for headache pain).  05/08/14   Historical Provider, MD  QUEtiapine (SEROQUEL) 50 MG tablet Take 50 mg by mouth at bedtime.  03/25/15   Historical Provider, MD    Review of Systems:  Constitutional:  No weight loss, night sweats, Fevers, chills, fatigue.  Head&Eyes: No headache.  No vision loss.  No eye pain or scotoma ENT:  No Difficulty swallowing,Tooth/dental problems,Sore throat,   Cardio-vascular:  No  Orthopnea, PND, swelling in lower extremities,  dizziness, palpitations  GI:  No  abdominal pain, nausea, vomiting, loss of appetite, hematochezia, melena, heartburn, indigestion, Resp:  No shortness of breath with exertion or at rest. No cough. No coughing up of blood .No wheezing.No chest wall deformity  Skin:  no rash or lesions.  GU:  no dysuria, change in color of urine, no urgency or frequency. No flank pain.  Musculoskeletal:  No joint pain or swelling. No decreased range of motion. No back pain.  Psych:  No change in mood or affect.  Neurologic: No headache, no  dysesthesia, no focal weakness, no vision loss. No syncope  Physical Exam: Filed Vitals:   05/28/15 1900 05/28/15 1930 05/28/15 2000 05/28/15 2030  BP: 150/72 151/88 168/89 177/83  Pulse:  80 79   Temp:      TempSrc:      Resp: 21 22 18 19   Height:      Weight:      SpO2:  96% 98%    General:  A&O x 2, NAD, nontoxic, pleasant/cooperative Head/Eye: No conjunctival hemorrhage, no icterus, Lorenzo/AT, No nystagmus ENT:  No icterus,  No thrush, good dentition, no pharyngeal exudate Neck:  No masses, no lymphadenpathy, no bruits CV:  RRR, no rub, no gallop, no S3 Lung:  CTAB, good air movement, no wheeze, no rhonchi Abdomen: soft/NT, +BS, nondistended, no peritoneal signs Ext: No cyanosis, No rashes, No petechiae, No lymphangitis, No edema Neuro: CNII-XII intact, strength 4/5 in bilateral upper and lower extremities, no dysmetria  Labs on Admission:  Basic Metabolic Panel:  Recent Labs Lab 05/28/15 1538  NA 139  K 4.7  CL 113*  CO2 17*  GLUCOSE 95  BUN 43*  CREATININE 2.76*  CALCIUM 8.4*   Liver Function Tests:  Recent Labs Lab 05/28/15 1538  AST 16  ALT 13*  ALKPHOS 81  BILITOT 0.3  PROT 8.0  ALBUMIN 4.2   No results for input(s): LIPASE, AMYLASE in the last 168 hours.  Recent Labs Lab 05/28/15 1538  AMMONIA 30   CBC:  Recent Labs Lab 05/28/15 1538  WBC 8.0  NEUTROABS 5.0  HGB 14.0  HCT 44.7  MCV 102.3*  PLT 332   Cardiac Enzymes:  Recent Labs Lab 05/28/15 1538  TROPONINI 0.08*   BNP: Invalid input(s): POCBNP CBG: No results for input(s): GLUCAP in the last 168 hours.  Radiological Exams on Admission: Dg Chest 2 View  05/28/2015   CLINICAL DATA:  Chest pain and shortness of breath.  EXAM: CHEST  2 VIEW  COMPARISON:  Apr 11, 2015.  FINDINGS: Stable cardiomediastinal silhouette. No pneumothorax is noted. Stable elevation of right hemidiaphragm is noted. Mild right basilar subsegmental atelectasis is noted. Mild central pulmonary vascular  congestion is noted. Bony thorax is intact.  IMPRESSION: Stable elevation of right hemidiaphragm with associated mild right basilar subsegmental atelectasis. Mild central pulmonary vascular congestion is noted.   Electronically Signed   By: Marijo Conception, M.D.   On: 05/28/2015 15:29   Ct Head Wo Contrast  05/28/2015   CLINICAL DATA:  Headache for 2 weeks.  EXAM: CT HEAD WITHOUT CONTRAST  TECHNIQUE: Contiguous axial images were obtained from the base of the skull through the vertex without intravenous contrast.  COMPARISON:  CT scan of February 14, 2013.  FINDINGS: Bony calvarium appears intact.  No mass effect or midline shift is noted. Ventricular size is within normal limits. There is no evidence of mass lesion, hemorrhage or acute infarction.  IMPRESSION: Normal head CT.   Electronically Signed   By: Marijo Conception, M.D.   On: 05/28/2015 17:45    EKG: Independently reviewed. Sinus rhythm, nonspecific T-wave changes    Time spent 60 minutes Code Status:   FULL Family Communication:   Son updated at bedside   Shaliah Wann, DO  [facility] Pager [phone]  If 7PM-7AM, please contact night-coverage [email or web address] Password [email or web address] 05/28/2015, 10:35 PM

## 2015-05-28 NOTE — ED Provider Notes (Signed)
CSN: [id number]     Arrival date & time 05/28/15  1318 History   First MD Initiated Contact with Patient 05/28/15 1458     Chief Complaint  Patient presents with  . Altered Mental Status  . Weakness     (Consider location/radiation/quality/duration/timing/severity/associated sxs/prior Treatment) HPI Comments: Patient comes to the ER after being seen by primary care physician in the office today. Patient has reportedly been experiencing headache for approximately a week. Family reports that she has been more confused than usual. She is very slow to respond to questions.  Patient is a 64 y.o. female presenting with altered mental status and weakness.  Altered Mental Status Presenting symptoms: confusion   Associated symptoms: headaches and weakness   Weakness Associated symptoms include headaches.    Past Medical History  Diagnosis Date  . Seizures   . Chronic headaches   . Hypertension   . Osteoporosis   . Depression   . 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 URI (upper respiratory infection) 08/2014    early October  . Renal insufficiency    Past Surgical History  Procedure Laterality Date  . Breast reduction surgery    . Abdominal hysterectomy    . Lung lobectomy      rt lung-cyst   . Appendectomy    . Cholecystectomy    . Back surgery  2002   Family History  Problem Relation Age of Onset  . Hypertension Mother   . Heart disease Mother   . Diabetes Mother   . Kidney disease Mother   . Arthritis Mother   . Hypertension Father    History  Substance Use Topics  . Smoking status: Former Smoker -- 1.00 packs/day for 30 years    Types: Cigarettes    Quit date: 11/13/2012  . Smokeless tobacco: Never Used  . Alcohol Use: No   OB History    No data available     Review of Systems  Neurological: Positive for weakness and headaches.  Psychiatric/Behavioral: Positive for confusion.  All other systems reviewed and are  negative.     Allergies  Asa; Penicillins; Sulfonamide derivatives; and Tylenol with codeine #3  Home Medications   Prior to Admission medications   Medication Sig Start Date End Date Taking? Authorizing Provider  baclofen (LIORESAL) 10 MG tablet Take 10 mg by mouth 3 (three) times daily as needed for muscle spasms.  05/25/15  Yes Historical Provider, MD  calcitRIOL (ROCALTROL) 0.5 MCG capsule Take 0.5 mcg by mouth daily.  04/24/15  Yes Historical Provider, MD  DULoxetine (CYMBALTA) 60 MG capsule Take 60 mg by mouth daily.   Yes Historical Provider, MD  levETIRAcetam (KEPPRA) 500 MG tablet TAKE 1 TABLET BY MOUTH TWICE DAILY 04/01/15  Yes Mary-Margaret Hassell Done, FNP  LORazepam (ATIVAN) 1 MG tablet Take 1 tablet (1 mg total) by mouth 2 (two) times daily. 04/07/15  Yes Mary-Margaret Hassell Done, FNP  losartan (COZAAR) 25 MG tablet Take 1 tablet (25 mg total) by mouth daily. 03/13/15  Yes Mary-Margaret Hassell Done, FNP  meloxicam (MOBIC) 15 MG tablet TAKE 1 TABLET BY MOUTH ONCE DAILY 05/27/15  Yes Mary-Margaret Hassell Done, FNP  metoprolol (LOPRESSOR) 50 MG tablet Take 1 tablet (50 mg total) by mouth 2 (two) times daily. 03/13/15  Yes Mary-Margaret Hassell Done, FNP  ondansetron (ZOFRAN ODT) 4 MG disintegrating tablet Take 1 tablet (4 mg total) by mouth every 8 (eight) hours as needed for nausea or vomiting. 04/13/15  Yes Kelvin Cellar, MD  pantoprazole (PROTONIX) 40  MG tablet Take 1 tablet (40 mg total) by mouth daily. 03/13/15  Yes Mary-Margaret Hassell Done, FNP  simvastatin (ZOCOR) 10 MG tablet TAKE 1 TABLET BY MOUTH ONCE DAILY 08/13/14  Yes Lysbeth Penner, FNP  zolpidem (AMBIEN) 10 MG tablet TAKE 1 TABLET BY MOUTH AT BEDTIME AS NEEDED FOR SLEEP 05/26/15  Yes Mary-Margaret Hassell Done, FNP  amLODipine (NORVASC) 5 MG tablet Take 1 tablet (5 mg total) by mouth daily. 03/13/15 03/12/16  Mary-Margaret Hassell Done, FNP  aspirin EC 81 MG tablet Take 81 mg by mouth daily.    Historical Provider, MD  calcium acetate (PHOSLO) 667 MG capsule Take 1  capsule by mouth 3 (three) times daily. 03/12/15   Historical Provider, MD  cholecalciferol (VITAMIN D) 1000 UNITS tablet Take 1,000 Units by mouth daily.    Historical Provider, MD  cloNIDine (CATAPRES - DOSED IN MG/24 HR) 0.3 mg/24hr patch Place 1 patch (0.3 mg total) onto the skin once a week. 03/13/15   Mary-Margaret Hassell Done, FNP  ferrous sulfate 325 (65 FE) MG tablet Take 325 mg by mouth daily with breakfast.    Historical Provider, MD  gabapentin (NEURONTIN) 300 MG capsule Take 1 capsule (300 mg total) by mouth 3 (three) times daily. 03/13/15   Mary-Margaret Hassell Done, FNP  Ipratropium-Albuterol (COMBIVENT RESPIMAT) 20-100 MCG/ACT AERS respimat 2 puffs twice a day Patient taking differently: Inhale 2 puffs into the lungs 2 (two) times daily.  05/09/14   Lysbeth Penner, FNP  lidocaine (XYLOCAINE) 4 % external solution Apply topically as needed for mild pain or moderate pain (for headache pain).  05/08/14   Historical Provider, MD  QUEtiapine (SEROQUEL) 50 MG tablet Take 50 mg by mouth at bedtime.  03/25/15   Historical Provider, MD   BP 177/83 mmHg  Pulse 79  Temp(Src) 98.2 F (36.8 C) (Oral)  Resp 19  Ht 5\' 7"  (1.702 m)  Wt 220 lb (99.791 kg)  BMI 34.45 kg/m2  SpO2 98% Physical Exam  Constitutional: She is oriented to person, place, and time. She appears well-developed and well-nourished. No distress.  HENT:  Head: Normocephalic and atraumatic.  Right Ear: Hearing normal.  Left Ear: Hearing normal.  Nose: Nose normal.  Mouth/Throat: Oropharynx is clear and moist and mucous membranes are normal.  Eyes: Conjunctivae and EOM are normal. Pupils are equal, round, and reactive to light.  Neck: Normal range of motion. Neck supple.  Cardiovascular: Regular rhythm, S1 normal and S2 normal.  Exam reveals no gallop and no friction rub.   No murmur heard. Pulmonary/Chest: Effort normal and breath sounds normal. No respiratory distress. She exhibits no tenderness.  Abdominal: Soft. Normal appearance  and bowel sounds are normal. There is no hepatosplenomegaly. There is no tenderness. There is no rebound, no guarding, no tenderness at McBurney's point and negative Murphy's sign. No hernia.  Musculoskeletal: Normal range of motion.  Neurological: She is alert and oriented to person, place, and time. She has normal strength. No cranial nerve deficit or sensory deficit. Coordination normal. GCS eye subscore is 4. GCS verbal subscore is 5. GCS motor subscore is 6.  Skin: Skin is warm, dry and intact. No rash noted. No cyanosis.  Psychiatric: She has a normal mood and affect. Her speech is normal and behavior is normal. Thought content normal.  Nursing note and vitals reviewed.   ED Course  Procedures (including critical care time) Labs Review Labs Reviewed  CBC WITH DIFFERENTIAL/PLATELET - Abnormal; Notable for the following:    MCV 102.3 (*)  All other components within normal limits  COMPREHENSIVE METABOLIC PANEL - Abnormal; Notable for the following:    Chloride 113 (*)    CO2 17 (*)    BUN 43 (*)    Creatinine, Ser 2.76 (*)    Calcium 8.4 (*)    ALT 13 (*)    GFR calc non Af Amer 17 (*)    GFR calc Af Amer 20 (*)    All other components within normal limits  TROPONIN I - Abnormal; Notable for the following:    Troponin I 0.08 (*)    All other components within normal limits  BLOOD GAS, ARTERIAL - Abnormal; Notable for the following:    pH, Arterial 7.286 (*)    pO2, Arterial 64.6 (*)    Bicarbonate 18.2 (*)    Acid-base deficit 7.5 (*)    All other components within normal limits  I-STAT CG4 LACTIC ACID, ED - Abnormal; Notable for the following:    Lactic Acid, Venous 2.36 (*)    All other components within normal limits  URINE CULTURE  URINALYSIS, ROUTINE W REFLEX MICROSCOPIC (NOT AT [facility])  AMMONIA  SEDIMENTATION RATE  BRAIN NATRIURETIC PEPTIDE    Imaging Review Dg Chest 2 View  05/28/2015   CLINICAL DATA:  Chest pain and shortness of breath.  EXAM: CHEST  2 VIEW   COMPARISON:  Apr 11, 2015.  FINDINGS: Stable cardiomediastinal silhouette. No pneumothorax is noted. Stable elevation of right hemidiaphragm is noted. Mild right basilar subsegmental atelectasis is noted. Mild central pulmonary vascular congestion is noted. Bony thorax is intact.  IMPRESSION: Stable elevation of right hemidiaphragm with associated mild right basilar subsegmental atelectasis. Mild central pulmonary vascular congestion is noted.   Electronically Signed   By: Marijo Conception, M.D.   On: 05/28/2015 15:29   Ct Head Wo Contrast  05/28/2015   CLINICAL DATA:  Headache for 2 weeks.  EXAM: CT HEAD WITHOUT CONTRAST  TECHNIQUE: Contiguous axial images were obtained from the base of the skull through the vertex without intravenous contrast.  COMPARISON:  CT scan of February 14, 2013.  FINDINGS: Bony calvarium appears intact. No mass effect or midline shift is noted. Ventricular size is within normal limits. There is no evidence of mass lesion, hemorrhage or acute infarction.  IMPRESSION: Normal head CT.   Electronically Signed   By: Marijo Conception, M.D.   On: 05/28/2015 17:45     EKG Interpretation   Date/Time:  Thursday May 28 2015 14:04:52 EDT Ventricular Rate:  88 PR Interval:  155 QRS Duration: 70 QT Interval:  339 QTC Calculation: 410 R Axis:   96 Text Interpretation:  Sinus rhythm Right axis deviation Nonspecific T  abnormalities, lateral leads Baseline wander in lead(s) I III aVL V4  Artifact Confirmed by Wyvonnia Dusky  MD, STEPHEN ([id number]) on 05/28/2015 2:24:58 PM      MDM   Final diagnoses:  Headache  Acute encephalopathy  AKI (acute kidney injury)    Patient presents to the emergency room for evaluation of headache and confusion. Symptoms have been ongoing for more than a week. Patient has had progressively worsening headache and family reports that she has not been behaving like her normal self. Patient has been slow to respond and very confused, this is a significant change for  her.  CT scan was performed and is unremarkable. Patient had urinalysis that did not show any sign of infection. She had a very slightly elevated lactic acid, but no source of infection has  been found. Patient was noted to be somewhat hypoxic here in the ER. Blood gas to confirm hypoxia without CO2 retention. Chest x-ray shows central venous congestion, no overt failure or acute process such as pneumonia.  Blood work shows elevation of BUN and creatinine consistent with acute kidney injury. Patient likely has some element of dehydration. Remainder of electrolytes are unremarkable. She is very slightly elevated troponin. EKG did not show any evidence of clear ischemia or infarct, nonspecific T-wave abnormalities were noted.  Patient will require hospitalization for cycling of cardiac enzymes as well as further evaluation for acute encephalopathy and headache of unclear etiology.  Orpah Greek, MD 05/28/15 2118

## 2015-05-28 NOTE — ED Notes (Signed)
Lab at bedside

## 2015-05-29 ENCOUNTER — Inpatient Hospital Stay (HOSPITAL_COMMUNITY): Payer: Medicare Other

## 2015-05-29 ENCOUNTER — Encounter (HOSPITAL_COMMUNITY): Payer: Self-pay | Admitting: Cardiology

## 2015-05-29 DIAGNOSIS — R0789 Other chest pain: Secondary | ICD-10-CM

## 2015-05-29 DIAGNOSIS — E440 Moderate protein-calorie malnutrition: Secondary | ICD-10-CM | POA: Insufficient documentation

## 2015-05-29 DIAGNOSIS — R079 Chest pain, unspecified: Secondary | ICD-10-CM

## 2015-05-29 LAB — BASIC METABOLIC PANEL
Anion gap: 9 (ref 5–15)
BUN: 36 mg/dL — ABNORMAL HIGH (ref 6–20)
CO2: 21 mmol/L — ABNORMAL LOW (ref 22–32)
Calcium: 8.3 mg/dL — ABNORMAL LOW (ref 8.9–10.3)
Chloride: 111 mmol/L (ref 101–111)
Creatinine, Ser: 2.09 mg/dL — ABNORMAL HIGH (ref 0.44–1.00)
GFR calc Af Amer: 28 mL/min — ABNORMAL LOW (ref >60)
GFR calc non Af Amer: 24 mL/min — ABNORMAL LOW (ref >60)
Glucose, Bld: 101 mg/dL — ABNORMAL HIGH (ref 65–99)
Potassium: 4.5 mmol/L (ref 3.5–5.1)
Sodium: 141 mmol/L (ref 135–145)

## 2015-05-29 LAB — TSH: TSH: 0.22 u[IU]/mL — ABNORMAL LOW (ref 0.350–4.500)

## 2015-05-29 LAB — CBC
HCT: 42.6 % (ref 36.0–46.0)
Hemoglobin: 13.4 g/dL (ref 12.0–15.0)
MCH: 31.8 pg (ref 26.0–34.0)
MCHC: 31.5 g/dL (ref 30.0–36.0)
MCV: 100.9 fL — ABNORMAL HIGH (ref 78.0–100.0)
Platelets: 420 10*3/uL — ABNORMAL HIGH (ref 150–400)
RBC: 4.22 MIL/uL (ref 3.87–5.11)
RDW: 13.7 % (ref 11.5–15.5)
WBC: 6.3 10*3/uL (ref 4.0–10.5)

## 2015-05-29 LAB — RAPID URINE DRUG SCREEN, HOSP PERFORMED
Amphetamines: NOT DETECTED
Barbiturates: NOT DETECTED
Benzodiazepines: POSITIVE — AB
Cocaine: NOT DETECTED
Opiates: NOT DETECTED
Tetrahydrocannabinol: NOT DETECTED

## 2015-05-29 LAB — VITAMIN B12: Vitamin B-12: 599 pg/mL (ref 180–914)

## 2015-05-29 LAB — TROPONIN I
Troponin I: <0.03 ng/mL (ref <0.031)
Troponin I: <0.03 ng/mL (ref <0.031)
Troponin I: <0.03 ng/mL (ref <0.031)

## 2015-05-29 LAB — T4, FREE: Free T4: 0.85 ng/dL (ref 0.61–1.12)

## 2015-05-29 LAB — AMMONIA: Ammonia: 11 umol/L (ref 9–35)

## 2015-05-29 LAB — CK: Total CK: 150 U/L (ref 38–234)

## 2015-05-29 MED ORDER — CALCIUM CARBONATE ANTACID 500 MG PO CHEW: 2.0000 | CHEWABLE_TABLET | Freq: Three times a day (TID) | ORAL | Status: DC | PRN

## 2015-05-29 MED ORDER — LORAZEPAM 2 MG/ML IJ SOLN
1.0000 mg | Freq: Once | INTRAMUSCULAR | Status: AC
  Administered 2015-05-29: 1 mg via INTRAVENOUS
  Filled 2015-05-29: qty 1

## 2015-05-29 MED ORDER — GABAPENTIN 300 MG PO CAPS
300.0000 mg | ORAL_CAPSULE | Freq: Every day | ORAL | Status: DC
  Administered 2015-05-29 – 2015-05-30 (×2): 300 mg via ORAL
  Filled 2015-05-29 (×2): qty 1

## 2015-05-29 MED ORDER — NITROGLYCERIN 0.4 MG SL SUBL
0.4000 mg | SUBLINGUAL_TABLET | SUBLINGUAL | Status: DC | PRN
Start: 2015-05-29 — End: 2015-05-31

## 2015-05-29 MED ORDER — IPRATROPIUM-ALBUTEROL 0.5-2.5 (3) MG/3ML IN SOLN
3.0000 mL | Freq: Two times a day (BID) | RESPIRATORY_TRACT | Status: DC
  Administered 2015-05-29 – 2015-05-31 (×5): 3 mL via RESPIRATORY_TRACT
  Filled 2015-05-29 (×5): qty 3

## 2015-05-29 MED ORDER — METHYLPREDNISOLONE SODIUM SUCC 1000 MG IJ SOLR
1000.0000 mg | INTRAMUSCULAR | Status: AC
  Administered 2015-05-29 – 2015-05-31 (×3): 1000 mg via INTRAVENOUS
  Filled 2015-05-29 (×4): qty 8

## 2015-05-29 MED ORDER — ATORVASTATIN CALCIUM 40 MG PO TABS
80.0000 mg | ORAL_TABLET | Freq: Every day | ORAL | Status: DC
  Administered 2015-05-29 – 2015-05-30 (×2): 80 mg via ORAL
  Filled 2015-05-29 (×2): qty 2

## 2015-05-29 MED ORDER — LORAZEPAM 0.5 MG PO TABS
0.5000 mg | ORAL_TABLET | Freq: Two times a day (BID) | ORAL | Status: DC
  Administered 2015-05-29 (×2): 0.5 mg via ORAL
  Filled 2015-05-29 (×2): qty 1

## 2015-05-29 MED ORDER — HYDRALAZINE HCL 25 MG PO TABS
25.0000 mg | ORAL_TABLET | Freq: Three times a day (TID) | ORAL | Status: DC
  Administered 2015-05-29 (×3): 25 mg via ORAL
  Filled 2015-05-29 (×3): qty 1

## 2015-05-29 MED ORDER — SODIUM CHLORIDE 0.9 % IV SOLN
INTRAVENOUS | Status: DC
  Administered 2015-05-29 – 2015-05-30 (×3): via INTRAVENOUS

## 2015-05-29 NOTE — Progress Notes (Addendum)
Initial Nutrition Assessment  DOCUMENTATION CODES: Non-severe (moderate) malnutrition in context of chronic illness, Obesity unspecified   INTERVENTION: Magic cup Daily-each supplement provides 290 kcal and 9 grams of protein  Obtained Dinner Preferences  NUTRITION DIAGNOSIS: Inadequate oral intake related to loss of appetite/reported chest pain/ possible lethargy as evidenced by loss of 7.8% bw in 3 months  GOAL: Patient will meet greater than or equal to 90% of their needs  MONITOR: PO intake, Supplement acceptance, Labs, a better hx of wt/oral intake  REASON FOR ASSESSMENT: Malnutrition Screening Tool     ASSESSMENT: 64 year old female with a history of MGUS, restrictive lung disease, hypertension, seizure, depression, CKD stage III, hyperlipidemia, hypertension presents with one-week history of increasing confusion and lethargy.   Pt still confused upon RD arrival. Unsure of accuracy of report.   Pt states she has not been eating well. For the most part, it sound like she just isnt hungry. She did report having gi distress: n/v/c/d, but these seemed to not be ongoing issues. She eats 1 meal a day, in part, because she has acid-reflux towards the evening which reduces her appetite. She does not follow any diet.   She states her normal weight is 220 lbs. She acknowledges wt loss. Looks to be about 18 lbs in 3 months.   I obtained some dinner preferences which I will see if she can get this evening.  She also reports she drinks 2 boost supplements daily. She does not like Ensure at all. Agreeable to [employer].  Height: Ht Readings from Last 1 Encounters:  05/28/15 5\' 7"  (1.702 m)    Weight: Wt Readings from Last 1 Encounters:  05/28/15 220 lb (99.791 kg)    Ideal Body Weight:  61.36 kg  Wt Readings from Last 10 Encounters:  05/28/15 220 lb (99.791 kg)  05/29/15 220 lb (99.791 kg)  05/28/15 211 lb (95.709 kg)  04/11/15 219 lb 9.6 oz (99.61 kg)  04/10/15 218 lb  (98.884 kg)  03/27/15 229 lb 9.6 oz (104.146 kg)  03/13/15 236 lb (107.049 kg)  02/26/15 229 lb 12.8 oz (104.237 kg)  02/05/15 234 lb (106.142 kg)  11/14/14 230 lb (104.327 kg)  Admit weight is 211 lbs (95.7 kg)  -dosing  BMI:  Body mass index is 34.45 kg/(m^2).  Estimated Nutritional Needs: Kcal:  1550-1650 (16-17 kcal/kg) Protein:  61-74 (1-1.2 g/kg ibw) Fluid:  1.6-1.7 liters  Skin:  Reviewed, no issues  Diet Order:  Diet Heart Room service appropriate?: Yes; Fluid consistency:: Thin  EDUCATION NEEDS: No education needs identified at this time   Intake/Output Summary (Last 24 hours) at 05/29/15 1432 Last data filed at 05/29/15 1200  Gross per 24 hour  Intake    965 ml  Output      0 ml  Net    965 ml   Last BM:  6/30  Burtis Junes RD, LDN Nutrition Pager: [phone] 05/29/2015 2:32 PM

## 2015-05-29 NOTE — Progress Notes (Signed)
Pt refused clonidine patch stating " I stopped taking these a while ago because it makes me itch.'

## 2015-05-29 NOTE — Consult Note (Signed)
[place] A. Merlene Laughter, MD     [email or web address]          LENZIE MONTESANO is an 64 y.o. female.   ASSESSMENT/PLAN: 1. Encephalopathy this is most likely multifactorial in etiologies. The MRI findings, seizures and confusion that has been going on subacutely point to limbic encephalitis is a possible etiology. However, she also has evidence of acute renal failure and polypharmacy which are undoubtedly causative. She actually has improved since been admitted and these medications discontinue. It is unlikely that the patient has herpes encephalitis given the clinical picture. 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    Autoantibodies directed against the cell membrane/channels to respond to steroids and  tend not to be associated with underlying neoplasm. Intra cytoplasmic autoantibodies/neuronal antibodies tend to be less responsive to steroids and are associated with underlying malignancy. Typical malignancies are small cell carcinoma, seminoma-not germane in this case -, breast cancer and teratoma. I would recommend treatment with high-dose steroids for the 3-5 days. I do not recommend tests for auto antibodies as these are very extensive and the list is ever-increasing every few months ( This could be done later at an academic institution). I would recommend CT of the chest, mammography and ultrasound of the ovaries.  The patient is a 64 year old black female who tells me that she has a long-standing history of episodic headaches. 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 She has been seen by a neurologist in [place]. She has had Botox injections for these recalcitrant persistent headaches which tends to work but only tends to last for about a month after the injections. The patient was admitted to the hospital after presenting with a week onset of progressive  lethargy and confusion. She was asked to seen here about a month ago for chest pain and generalized weakness. The workup was unrevealing. The patient is on multiple medications. She has has had a lot of these psychotropic medications held. Additionally, she did present with acute renal failure with a creatinine close to 3. Her baseline creatinine is simply 1.8. She is actually improved dramatically since being hospitalized. It appears that she was "talking out of her head". The patient tells me that she has had EEG done last month. She does not know the results of it. She has been having seizures. She has been on seizure medication over the last several months. The review systems otherwise negative.  GENERAL: This is pleasant female who is in no acute distress.  HEENT: Supple. Atraumatic normocephalic.   ABDOMEN: soft  EXTREMITIES: No edema   BACK: Normal.  SKIN: Normal by inspection.    MENTAL STATUS: Alert and oriented. Speech, language and cognition are generally intact. Judgment and insight normal.   CRANIAL NERVES: Pupils are equal, round and reactive to light and accommodation; extra ocular movements are full, there is no significant nystagmus; visual fields are full; upper and lower facial muscles are normal in strength and symmetric, there is no flattening of the nasolabial folds; tongue is midline; uvula is midline; shoulder elevation is normal.  MOTOR: Normal tone, bulk and strength; no pronator drift.  COORDINATION: Left finger to nose is normal, right finger to nose is normal, No rest tremor; no intention tremor; no postural tremor; no bradykinesia.  REFLEXES: Deep tendon reflexes are symmetrical and normal.    SENSATION: Normal to light touch.   The brain MRI is reviewed in person.  There is increased signal noted on diffusion images involving the medial temporal regions bilaterally. This is in the location work artifact is typically present but this seems more prominent than  expected for artifact. No other abnormalities are appreciated.    Blood pressure 146/73, pulse 72, temperature 97.6 F (36.4 C), temperature source Oral, resp. rate 18, height _0  (1.702 m), weight 99.791 kg (220 lb), SpO2 98 %.  Past Medical History  Diagnosis Date  . Seizures   . Chronic headaches   . Essential hypertension   . Osteoporosis   . Depression   . 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 URI (upper respiratory infection)   . CKD (chronic kidney disease) stage 3, GFR 30-59 ml/min   . Restrictive lung disease   . Hyperlipidemia     Past Surgical History  Procedure Laterality Date  . Breast reduction surgery    . Abdominal hysterectomy    . Lung lobectomy Right     Cyst  . Appendectomy    . Cholecystectomy    . Back surgery  2002    Family History  Problem Relation Age of Onset  . Hypertension Mother   . Heart disease Mother   . Diabetes Mother   . Kidney disease Mother   . Arthritis Mother   . Hypertension Father     Social History:  reports that she quit smoking about 2 years ago. Her smoking use included Cigarettes. She has a 30 pack-year smoking history. She has never used smokeless tobacco. She reports that she does not drink alcohol or use illicit drugs.  Allergies:  Allergies  Allergen Reactions  . Asa [Aspirin]     High dose Aspirin causes itching but can take 40m   . Penicillins Hives  . Sulfonamide Derivatives Hives  . Tylenol With Codeine #3 [Acetaminophen-Codeine] Itching    Medications: Prior to Admission medications   Medication Sig Start Date End Date Taking? Authorizing Provider  baclofen (LIORESAL) 10 MG tablet Take 10 mg by mouth 3 (three) times daily as needed for muscle spasms.  05/25/15  Yes Historical Provider, MD  calcitRIOL (ROCALTROL) 0.5 MCG capsule Take 0.5 mcg by mouth daily.  04/24/15  Yes Historical Provider, MD  DULoxetine (CYMBALTA) 60 MG capsule Take 60 mg by mouth daily.   Yes Historical  Provider, MD  levETIRAcetam (KEPPRA) 500 MG tablet TAKE 1 TABLET BY MOUTH TWICE DAILY 04/01/15  Yes Mary-Margaret MHassell Done FNP  LORazepam (ATIVAN) 1 MG tablet Take 1 tablet (1 mg total) by mouth 2 (two) times daily. 04/07/15  Yes Mary-Margaret MHassell Done FNP  losartan (COZAAR) 25 MG tablet Take 1 tablet (25 mg total) by mouth daily. Patient taking differently: Take 25 mg by mouth at bedtime.  03/13/15  Yes Mary-Margaret MHassell Done FNP  meloxicam (MOBIC) 15 MG tablet TAKE 1 TABLET BY MOUTH ONCE DAILY 05/27/15  Yes Mary-Margaret MHassell Done FNP  metoprolol (LOPRESSOR) 50 MG tablet Take 1 tablet (50 mg total) by mouth 2 (two) times daily. 03/13/15  Yes Mary-Margaret MHassell Done FNP  ondansetron (ZOFRAN ODT) 4 MG disintegrating tablet Take 1 tablet (4 mg total) by mouth every 8 (eight) hours as needed for nausea or vomiting. 04/13/15  Yes EKelvin Cellar MD  pantoprazole (PROTONIX) 40 MG tablet Take 1 tablet (40 mg total) by mouth daily. 03/13/15  Yes Mary-Margaret MHassell Done FNP  simvastatin (ZOCOR) 10 MG tablet TAKE 1 TABLET BY MOUTH ONCE DAILY 08/13/14  Yes WLysbeth Penner FNP  zolpidem (AMBIEN) 10 MG tablet TAKE 1  TABLET BY MOUTH AT BEDTIME AS NEEDED FOR SLEEP 05/26/15  Yes Mary-Margaret Hassell Done, FNP  amLODipine (NORVASC) 5 MG tablet Take 1 tablet (5 mg total) by mouth daily. Patient taking differently: Take 5 mg by mouth at bedtime.  03/13/15 03/12/16  Mary-Margaret Hassell Done, FNP  aspirin EC 81 MG tablet Take 81 mg by mouth daily.    Historical Provider, MD  calcium acetate (PHOSLO) 667 MG capsule Take 1 capsule by mouth 3 (three) times daily. 03/12/15   Historical Provider, MD  cholecalciferol (VITAMIN D) 1000 UNITS tablet Take 1,000 Units by mouth daily.    Historical Provider, MD  cloNIDine (CATAPRES - DOSED IN MG/24 HR) 0.3 mg/24hr patch Place 1 patch (0.3 mg total) onto the skin once a week. Patient not taking: Reported on 05/29/2015 03/13/15   Mary-Margaret Hassell Done, FNP  DULoxetine (CYMBALTA) 30 MG capsule Take 30 mg by mouth  daily. 05/04/15   Historical Provider, MD  ferrous sulfate 325 (65 FE) MG tablet Take 325 mg by mouth daily with breakfast.    Historical Provider, MD  gabapentin (NEURONTIN) 300 MG capsule Take 1 capsule (300 mg total) by mouth 3 (three) times daily. 03/13/15   Mary-Margaret Hassell Done, FNP  Ipratropium-Albuterol (COMBIVENT RESPIMAT) 20-100 MCG/ACT AERS respimat 2 puffs twice a day Patient taking differently: Inhale 2 puffs into the lungs 2 (two) times daily.  05/09/14   Lysbeth Penner, FNP    Scheduled Meds: . amLODipine  5 mg Oral Daily  . aspirin EC  81 mg Oral Daily  . atorvastatin  80 mg Oral q1800  . calcitRIOL  0.5 mcg Oral Daily  . calcium acetate  667 mg Oral TID WC  . cholecalciferol  1,000 Units Oral Daily  . cloNIDine  0.3 mg Transdermal Weekly  . DULoxetine  60 mg Oral Daily  . ferrous sulfate  325 mg Oral Q breakfast  . gabapentin  300 mg Oral QHS  . hydrALAZINE  25 mg Oral TID  . ipratropium-albuterol  3 mL Nebulization BID  . levETIRAcetam  500 mg Oral BID  . LORazepam  0.5 mg Oral BID  . metoprolol  50 mg Oral BID  . pantoprazole  40 mg Oral Daily  . sodium chloride  3 mL Intravenous Q12H   Continuous Infusions: . sodium chloride 75 mL/hr at 05/29/15 1701   PRN Meds:.acetaminophen **OR** acetaminophen, calcium carbonate, nitroGLYCERIN, ondansetron **OR** ondansetron (ZOFRAN) IV     Results for orders placed or performed during the hospital encounter of 05/28/15 (from the past 48 hour(s))  CBC with Differential     Status: Abnormal   Collection Time: 05/28/15  3:38 PM  Result Value Ref Range   WBC 8.0 4.0 - 10.5 K/uL   RBC 4.37 3.87 - 5.11 MIL/uL   Hemoglobin 14.0 12.0 - 15.0 g/dL   HCT 44.7 36.0 - 46.0 %   MCV 102.3 (H) 78.0 - 100.0 fL   MCH 32.0 26.0 - 34.0 pg   MCHC 31.3 30.0 - 36.0 g/dL   RDW 13.9 11.5 - 15.5 %   Platelets 332 150 - 400 K/uL    Comment: SPECIMEN CHECKED FOR CLOTS PLATELET COUNT CONFIRMED BY SMEAR LARGE PLATELETS PRESENT    Neutrophils  Relative % 63 43 - 77 %   Neutro Abs 5.0 1.7 - 7.7 K/uL   Lymphocytes Relative 27 12 - 46 %   Lymphs Abs 2.1 0.7 - 4.0 K/uL   Monocytes Relative 7 3 - 12 %   Monocytes Absolute 0.6 0.1 -  1.0 K/uL   Eosinophils Relative 2 0 - 5 %   Eosinophils Absolute 0.2 0.0 - 0.7 K/uL   Basophils Relative 1 0 - 1 %   Basophils Absolute 0.1 0.0 - 0.1 K/uL   WBC Morphology ATYPICAL LYMPHOCYTES     Comment: WHITE COUNT CONFIRMED ON SMEAR  Comprehensive metabolic panel     Status: Abnormal   Collection Time: 05/28/15  3:38 PM  Result Value Ref Range   Sodium 139 135 - 145 mmol/L   Potassium 4.7 3.5 - 5.1 mmol/L   Chloride 113 (H) 101 - 111 mmol/L   CO2 17 (L) 22 - 32 mmol/L   Glucose, Bld 95 65 - 99 mg/dL   BUN 43 (H) 6 - 20 mg/dL   Creatinine, Ser 2.76 (H) 0.44 - 1.00 mg/dL   Calcium 8.4 (L) 8.9 - 10.3 mg/dL   Total Protein 8.0 6.5 - 8.1 g/dL   Albumin 4.2 3.5 - 5.0 g/dL   AST 16 15 - 41 U/L   ALT 13 (L) 14 - 54 U/L   Alkaline Phosphatase 81 38 - 126 U/L   Total Bilirubin 0.3 0.3 - 1.2 mg/dL   GFR calc non Af Amer 17 (L) >60 mL/min   GFR calc Af Amer 20 (L) >60 mL/min    Comment: (NOTE) The eGFR has been calculated using the CKD EPI equation. This calculation has not been validated in all clinical situations. eGFR's persistently <60 mL/min signify possible Chronic Kidney Disease.    Anion gap 9 5 - 15  Ammonia     Status: None   Collection Time: 05/28/15  3:38 PM  Result Value Ref Range   Ammonia 30 9 - 35 umol/L  Troponin I     Status: Abnormal   Collection Time: 05/28/15  3:38 PM  Result Value Ref Range   Troponin I 0.08 (H) <0.031 ng/mL    Comment:        PERSISTENTLY INCREASED TROPONIN VALUES IN THE RANGE OF 0.04-0.49 ng/mL CAN BE SEEN IN:       -UNSTABLE ANGINA       -CONGESTIVE HEART FAILURE       -MYOCARDITIS       -CHEST TRAUMA       -ARRYHTHMIAS       -LATE PRESENTING MYOCARDIAL INFARCTION       -COPD   CLINICAL FOLLOW-UP RECOMMENDED.   Blood gas, arterial ([facility]  ONLY)     Status: Abnormal   Collection Time: 05/28/15  4:15 PM  Result Value Ref Range   pH, Arterial 7.286 (L) 7.350 - 7.450   pCO2 arterial 39.4 35.0 - 45.0 mmHg   pO2, Arterial 64.6 (L) 80.0 - 100.0 mmHg   Bicarbonate 18.2 (L) 20.0 - 24.0 mEq/L   TCO2 16.8 0 - 100 mmol/L   Acid-base deficit 7.5 (H) 0.0 - 2.0 mmol/L   O2 Saturation 89.9 %   Collection site REVIEWED BY    Drawn by [id number]    Sample type ARTERIAL DRAW    Allens test (pass/fail) PASS PASS  Urinalysis, Routine w reflex microscopic (not at [facility])     Status: None   Collection Time: 05/28/15  5:00 PM  Result Value Ref Range   Color, Urine YELLOW YELLOW   APPearance CLEAR CLEAR   Specific Gravity, Urine 1.020 1.005 - 1.030   pH 5.0 5.0 - 8.0   Glucose, UA NEGATIVE NEGATIVE mg/dL   Hgb urine dipstick NEGATIVE NEGATIVE   Bilirubin Urine NEGATIVE  NEGATIVE   Ketones, ur NEGATIVE NEGATIVE mg/dL   Protein, ur NEGATIVE NEGATIVE mg/dL   Urobilinogen, UA 0.2 0.0 - 1.0 mg/dL   Nitrite NEGATIVE NEGATIVE   Leukocytes, UA NEGATIVE NEGATIVE    Comment: MICROSCOPIC NOT DONE ON URINES WITH NEGATIVE PROTEIN, BLOOD, LEUKOCYTES, NITRITE, OR GLUCOSE <1000 mg/dL.  Urine culture     Status: None (Preliminary result)   Collection Time: 05/28/15  5:00 PM  Result Value Ref Range   Specimen Description URINE, CATHETERIZED    Special Requests NONE    Culture      NO GROWTH < 24 HOURS Performed at [facility]    Report Status PENDING   Urine rapid drug screen (hosp performed)     Status: Abnormal   Collection Time: 05/28/15  5:00 PM  Result Value Ref Range   Opiates NONE DETECTED NONE DETECTED   Cocaine NONE DETECTED NONE DETECTED   Benzodiazepines POSITIVE (A) NONE DETECTED   Amphetamines NONE DETECTED NONE DETECTED   Tetrahydrocannabinol NONE DETECTED NONE DETECTED   Barbiturates NONE DETECTED NONE DETECTED    Comment:        DRUG SCREEN FOR MEDICAL PURPOSES ONLY.  IF CONFIRMATION IS NEEDED FOR ANY PURPOSE, NOTIFY  LAB WITHIN 5 DAYS.        LOWEST DETECTABLE LIMITS FOR URINE DRUG SCREEN Drug Class       Cutoff (ng/mL) Amphetamine      1000 Barbiturate      200 Benzodiazepine   416 Tricyclics       384 Opiates          300 Cocaine          300 THC              50   I-Stat CG4 Lactic Acid, ED     Status: Abnormal   Collection Time: 05/28/15  5:56 PM  Result Value Ref Range   Lactic Acid, Venous 2.36 (HH) 0.5 - 2.0 mmol/L  Sedimentation rate     Status: None   Collection Time: 05/28/15  6:15 PM  Result Value Ref Range   Sed Rate 13 0 - 22 mm/hr  Brain natriuretic peptide     Status: None   Collection Time: 05/28/15  9:03 PM  Result Value Ref Range   B Natriuretic Peptide 38.0 0.0 - 100.0 pg/mL  Vitamin B12     Status: None   Collection Time: 05/28/15 11:00 PM  Result Value Ref Range   Vitamin B-12 599 180 - 914 pg/mL    Comment: (NOTE) This assay is not validated for testing neonatal or myeloproliferative syndrome specimens for Vitamin B12 levels. Performed at [facility]   TSH     Status: Abnormal   Collection Time: 05/28/15 11:00 PM  Result Value Ref Range   TSH 0.220 (L) 0.350 - 4.500 uIU/mL  Troponin I     Status: None   Collection Time: 05/28/15 11:00 PM  Result Value Ref Range   Troponin I <0.03 <0.031 ng/mL    Comment:        NO INDICATION OF MYOCARDIAL INJURY.   CK     Status: None   Collection Time: 05/28/15 11:00 PM  Result Value Ref Range   Total CK 150 38 - 234 U/L  Basic metabolic panel     Status: Abnormal   Collection Time: 05/29/15  8:01 AM  Result Value Ref Range   Sodium 141 135 - 145 mmol/L   Potassium 4.5 3.5 -  5.1 mmol/L   Chloride 111 101 - 111 mmol/L   CO2 21 (L) 22 - 32 mmol/L   Glucose, Bld 101 (H) 65 - 99 mg/dL   BUN 36 (H) 6 - 20 mg/dL   Creatinine, Ser 2.09 (H) 0.44 - 1.00 mg/dL   Calcium 8.3 (L) 8.9 - 10.3 mg/dL   GFR calc non Af Amer 24 (L) >60 mL/min   GFR calc Af Amer 28 (L) >60 mL/min    Comment: (NOTE) The eGFR has been  calculated using the CKD EPI equation. This calculation has not been validated in all clinical situations. eGFR's persistently <60 mL/min signify possible Chronic Kidney Disease.    Anion gap 9 5 - 15  CBC     Status: Abnormal   Collection Time: 05/29/15  8:01 AM  Result Value Ref Range   WBC 6.3 4.0 - 10.5 K/uL   RBC 4.22 3.87 - 5.11 MIL/uL   Hemoglobin 13.4 12.0 - 15.0 g/dL   HCT 42.6 36.0 - 46.0 %   MCV 100.9 (H) 78.0 - 100.0 fL   MCH 31.8 26.0 - 34.0 pg   MCHC 31.5 30.0 - 36.0 g/dL   RDW 13.7 11.5 - 15.5 %   Platelets 420 (H) 150 - 400 K/uL  Troponin I     Status: None   Collection Time: 05/29/15  8:01 AM  Result Value Ref Range   Troponin I <0.03 <0.031 ng/mL    Comment:        NO INDICATION OF MYOCARDIAL INJURY.   Ammonia     Status: None   Collection Time: 05/29/15  8:01 AM  Result Value Ref Range   Ammonia 11 9 - 35 umol/L  T4, free     Status: None   Collection Time: 05/29/15  8:01 AM  Result Value Ref Range   Free T4 0.85 0.61 - 1.12 ng/dL    Comment: Performed at [facility]  Troponin I     Status: None   Collection Time: 05/29/15  3:23 PM  Result Value Ref Range   Troponin I <0.03 <0.031 ng/mL    Comment:        NO INDICATION OF MYOCARDIAL INJURY.     Studies/Results:     Enis Riecke A. Merlene Laughter, M.D.  Diplomate, Tax adviser of Psychiatry and Neurology ( Neurology). 05/29/2015, 6:23 PM

## 2015-05-29 NOTE — Progress Notes (Addendum)
[facility] PROGRESS NOTE  [name] [id number] DOB: [birth year] DOA: 05/28/2015 PCP: Chevis Pretty, FNP  Brief Summary  64 year old female with a history of MGUS, restrictive lung disease, hypertension, seizure, depression, CKD stage III, hyperlipidemia, hypertension presents with one-week history of increasing confusion and lethargy. The patient's family states that she has been "talking out of her head" and has had worsening gait instability and weakness for the last several weeks.   The patient has had intermittent moderate substernal chest pressure both at rest and with exertion, associated with SOB, nausea, diaphoresis.  Symptoms get better spontaneously after a few minutes.  Her chest tightness does not get better with breathing treatments and is separate from her normal SOB.   The patient has had a workup for shortness of breath in the past and has seen a pulmonologist. It was felt that her dyspnea was multifactorial including restrictive lung disease in the setting of OHS and paralyzed right hemidiaphragm.  She has had loose stools for the past 2-3 days without any hematochezia or melena. She has some nausea without vomiting. According to the patient's son at the bedside. The patient has had a gradual functional as well as cognitive decline over the past year. The patient had a recent admission to [facility] for acute encephalopathy for which she had a workup. It was felt that her encephalopathy was multifactorial including her medications and metabolic. In the emergency department, the patient was afebrile and hemodynamically stable. Oxygen saturation was 98-100 percent on room air. CBC was unremarkable. BMP showed serum creatinine 2.76. Hepatic enzymes were negative. Lactic acid was 2.36. Urinalysis was negative. Troponin was 0.08.   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   D/w neurology and radiology.  Clinical suspicion for herpes encephalitis is low given she is now improved, afebrile, and generally not ill-appearing.   -MRI brain without contrast:  Limbic encephalitis - ammonia 11 -TSH 0.22 >> free T4 ordered - B12 pending - folate pending - RPR, HIV pending - Continue to hold baclofen, Seroquel, Ambien -  Resume lower dose Ativan to prevent withdrawal (shaking this morning) -  Resume lower dose gabapentin since this is a seizure medication, change to 300mg  QHS -  Neurology consultation:  Appreciate assistance and agree with work up  -  Radiology consulted for LP which will be performed tomorrow (lovenox given this morning)  Atypical chest pain, does have chest pain with palpation but not the same as her pressure like pain -  Elevated troponin was likely secondary to AKI -  Does have risk factors for ACS including obesity, hypertension, and hyperlipidemia - ECG this morning with suggestion of mild (<0.6mm) ST segment elevations in the inferior leads - start statin and prn NTG - ECHO  -  Will see if cardiology would like to do stress test -  Consider GERD in differential > Tums prn  Acute on chronic renal failure (CKD stage 3), resolving -Baseline creatinine 1.7-1.8 -Volume depletion a setting of NSAID use  - continue to hold Mobic and losartan - Continue IV fluids   Diarrhea, patient states stools are soft and happen once or twice a day.  Denies recent watery stools, but I am unsure if her encephalopathy has impaired her recollection of recent events -No recent antibiotics  -C. difficile PCR  -Suspect the patient has underlying irritable bowel syndrome   Hypertension, blood pressure elevated -Hold losartan -Continue metoprolol tartrate, amlodipine, clonidine - add  prn hydralazine  Depression, stable, continue Cymbalta   Seizure disorder  -Continue Keppra  -  Resume gabapentin at 300mg  QHS  Low TSH -  F/u fT4  Diet:  Healthy  heart Access:  PIV IVF:  yes Proph:  lovenox  Code Status: full Family Communication: patient alone Disposition Plan: PT/OT, MRI and additional lab tests are pending   Consultants:  Cardiology  Procedures:  CXR:  Mild central pulmonary vascular congestion and stable elevation of the right hemidiaphragm  CT head:  Normal head CT  Antibiotics:  none   HPI/Subjective:  States her thinking feels clearer this morning, however she feels jittery and anxious and would like her ativan.  Pr RN, she was unable to undergo MRI this morning due to anxiety.  Denies recent diarrhea.  Reported episode of substernal chest pressure with some SOB, mild nausea, and diaphoresis overnight which has since resolved.  Lasted about 2 minutes.      Objective: Filed Vitals:   05/28/15 2300 05/28/15 2340 05/29/15 0434 05/29/15 0801  BP: 151/80 160/81 152/70   Pulse: 89 84 64   Temp:  97.7 F (36.5 C) 97.5 F (36.4 C)   TempSrc:  Oral Oral   Resp: 17 20 18    Height:  5\' 7"  (1.702 m)    Weight:      SpO2: 96% 97% 97% 96%    Intake/Output Summary (Last 24 hours) at 05/29/15 0846 Last data filed at 05/29/15 0700  Gross per 24 hour  Intake    485 ml  Output      0 ml  Net    485 ml   Filed Weights   05/28/15 1328  Weight: 99.791 kg (220 lb)    Exam:   General:  Adult female, No acute distress  HEENT:  NCAT, MMM  Cardiovascular:  RRR, nl S1, S2 no mrg, 2+ pulses, warm extremities  Respiratory:  Diminished bilateral breath sounds, particularly at the right base, no obvious wheeze or rhonchi, no increased WOB  Abdomen:   NABS, soft, NT/ND  MSK:   Normal tone and bulk, no LEE  Neuro:  Grossly intact  Data Reviewed: Basic Metabolic Panel:  Recent Labs Lab 05/28/15 1538 05/29/15 0801  NA 139 141  K 4.7 4.5  CL 113* 111  CO2 17* 21*  GLUCOSE 95 101*  BUN 43* 36*  CREATININE 2.76* 2.09*  CALCIUM 8.4* 8.3*   Liver Function Tests:  Recent Labs Lab 05/28/15 1538  AST 16   ALT 13*  ALKPHOS 81  BILITOT 0.3  PROT 8.0  ALBUMIN 4.2   No results for input(s): LIPASE, AMYLASE in the last 168 hours.  Recent Labs Lab 05/28/15 1538 05/29/15 0801  AMMONIA 30 11   CBC:  Recent Labs Lab 05/28/15 1538 05/29/15 0801  WBC 8.0 6.3  NEUTROABS 5.0  --   HGB 14.0 13.4  HCT 44.7 42.6  MCV 102.3* 100.9*  PLT 332 420*   Cardiac Enzymes:  Recent Labs Lab 05/28/15 1538 05/28/15 2300  CKTOTAL  --  150  TROPONINI 0.08* <0.03   BNP (last 3 results)  Recent Labs  05/28/15 2103  BNP 38.0    ProBNP (last 3 results) No results for input(s): PROBNP in the last 8760 hours.  CBG: No results for input(s): GLUCAP in the last 168 hours.  No results found for this or any previous visit (from the past 240 hour(s)).   Studies: Dg Chest 2 View  05/28/2015   CLINICAL DATA:  Chest  pain and shortness of breath.  EXAM: CHEST  2 VIEW  COMPARISON:  Apr 11, 2015.  FINDINGS: Stable cardiomediastinal silhouette. No pneumothorax is noted. Stable elevation of right hemidiaphragm is noted. Mild right basilar subsegmental atelectasis is noted. Mild central pulmonary vascular congestion is noted. Bony thorax is intact.  IMPRESSION: Stable elevation of right hemidiaphragm with associated mild right basilar subsegmental atelectasis. Mild central pulmonary vascular congestion is noted.   Electronically Signed   By: Marijo Conception, M.D.   On: 05/28/2015 15:29   Ct Head Wo Contrast  05/28/2015   CLINICAL DATA:  Headache for 2 weeks.  EXAM: CT HEAD WITHOUT CONTRAST  TECHNIQUE: Contiguous axial images were obtained from the base of the skull through the vertex without intravenous contrast.  COMPARISON:  CT scan of February 14, 2013.  FINDINGS: Bony calvarium appears intact. No mass effect or midline shift is noted. Ventricular size is within normal limits. There is no evidence of mass lesion, hemorrhage or acute infarction.  IMPRESSION: Normal head CT.   Electronically Signed   By: Marijo Conception, M.D.   On: 05/28/2015 17:45    Scheduled Meds: . amLODipine  5 mg Oral Daily  . aspirin EC  81 mg Oral Daily  . atorvastatin  80 mg Oral q1800  . calcitRIOL  0.5 mcg Oral Daily  . calcium acetate  667 mg Oral TID WC  . cholecalciferol  1,000 Units Oral Daily  . cloNIDine  0.3 mg Transdermal Weekly  . DULoxetine  60 mg Oral Daily  . enoxaparin (LOVENOX) injection  30 mg Subcutaneous Q24H  . ferrous sulfate  325 mg Oral Q breakfast  . gabapentin  300 mg Oral QHS  . ipratropium-albuterol  3 mL Nebulization BID  . levETIRAcetam  500 mg Oral BID  . LORazepam  0.5 mg Oral BID  . metoprolol  50 mg Oral BID  . pantoprazole  40 mg Oral Daily  . sodium chloride  3 mL Intravenous Q12H   Continuous Infusions: . sodium chloride      Active Problems:   Essential hypertension   Depression   MGUS (monoclonal gammopathy of unknown significance)   Diaphragm paralysis   Diarrhea   Acute encephalopathy   Acute renal failure superimposed on stage 3 chronic kidney disease    Time spent: 30 min    Asma Boldon, [place] [facility] Pager [phone]. If 7PM-7AM, please contact night-coverage at [email or web address], password [facility] 05/29/2015, 8:46 AM  LOS: 1 day

## 2015-05-29 NOTE — Care Management Note (Signed)
Case Management Note  Patient Details  Name: SANYIAH FLORIAN MRN: [id number] Date of Birth: [birth year]   Expected Discharge Date:                  Expected Discharge Plan:  [place]  In-House Referral:  NA  Discharge planning Services  CM Consult  Post Acute Care Choice:  Home Health Choice offered to:  Patient  DME Arranged:    DME Agency:     HH Arranged:  RN, PT [place] Agency:  [place]  Status of Service:  Completed, signed off  Medicare Important Message Given:    Date Medicare IM Given:    Medicare IM give by:    Date Additional Medicare IM Given:    Additional Medicare Important Message give by:     If discussed at [place] of Stay Meetings, dates discussed:    Additional Comments: Pt is from home, lives with her son and uses a can/walker with ambulation. Pt has [facility] but no other DME. Pt has no HH services prior to admission. Anticipate need to [facility] RN and PT at discharge. Pt would like [facility] for [facility] services. Romualdo Bolk, of [facility] made aware of referral and will obtain pt info from chart. If pt discharges over weekend, RN will notify [facility] of discharge and fax [facility] order. No further CM needs anticipated.  Sherald Barge, RN 05/29/2015, 2:13 PM

## 2015-05-29 NOTE — Consult Note (Signed)
CARDIOLOGY CONSULT NOTE   Patient ID: EBELYN LESNER MRN: [id number] DOB/AGE: November 05, 1951 64 y.o.  Admit Date: 05/28/2015 Referring Physician: PTH Primary Physician: Chevis Pretty, FNP Consulting Cardiologist: Rozann Lesches MD Primary Cardiologist: Loralie Champagne MD (2011) Reason for Consultation:  Chest Pain  Clinical Summary Ms. Krol is a 64 y.o.female with past medical history outlined below, now admitted to the hospital with recent confusion and lethargy. She is being managed by the primary service for acute encephalopathy, felt most likely be to be related to medications. Baclofen, Seroquel, and Ambien have been held, with other adjustments in her remaining medications. She also has additional workup pending including a head MRI.  She reports a chronic history of recurrent chest pain. Describes a heaviness that occurs suddenly without obvious precipitant, usually last for only a few seconds. This is not purely exertional in nature. She describes it in other ways as being like indigestion, and also takes Tums. Record review finds previous evaluation by Dr. Aundra Dubin back in 2011. She tells me that her symptoms are similar to what she was having at that time, and again have been fairly chronic. She was to undergo follow-up ischemic testing, although I cannot locate any results.  Initial troponin I was 0.08, however subsequent to levels were completely normal. ECG shows no significant ST segment changes.   Allergies  Allergen Reactions  . Asa [Aspirin]     High dose Aspirin causes itching but can take 81mg    . Penicillins Hives  . Sulfonamide Derivatives Hives  . Tylenol With Codeine #3 [Acetaminophen-Codeine] Itching    Medications Scheduled Medications: . amLODipine  5 mg Oral Daily  . aspirin EC  81 mg Oral Daily  . atorvastatin  80 mg Oral q1800  . calcitRIOL  0.5 mcg Oral Daily  . calcium acetate  667 mg Oral TID WC  . cholecalciferol  1,000 Units Oral  Daily  . cloNIDine  0.3 mg Transdermal Weekly  . DULoxetine  60 mg Oral Daily  . enoxaparin (LOVENOX) injection  30 mg Subcutaneous Q24H  . ferrous sulfate  325 mg Oral Q breakfast  . gabapentin  300 mg Oral QHS  . hydrALAZINE  25 mg Oral TID  . ipratropium-albuterol  3 mL Nebulization BID  . levETIRAcetam  500 mg Oral BID  . LORazepam  0.5 mg Oral BID  . metoprolol  50 mg Oral BID  . pantoprazole  40 mg Oral Daily  . sodium chloride  3 mL Intravenous Q12H    Infusions: . sodium chloride      PRN Medications: acetaminophen **OR** acetaminophen, calcium carbonate, nitroGLYCERIN, ondansetron **OR** ondansetron (ZOFRAN) IV   Past Medical History  Diagnosis Date  . Seizures   . Chronic headaches   . Essential hypertension   . Osteoporosis   . Depression   . 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 URI (upper respiratory infection)   . CKD (chronic kidney disease) stage 3, GFR 30-59 ml/min   . Restrictive lung disease     Past Surgical History  Procedure Laterality Date  . Breast reduction surgery    . Abdominal hysterectomy    . Lung lobectomy Right     Cyst  . Appendectomy    . Cholecystectomy    . Back surgery  2002    Family History  Problem Relation Age of Onset  . Hypertension Mother   . Heart disease Mother   . Diabetes Mother   . Kidney disease Mother   .  Arthritis Mother   . Hypertension Father     Social History Ms. Gemmer reports that she quit smoking about 2 years ago. Her smoking use included Cigarettes. She has a 30 pack-year smoking history. She has never used smokeless tobacco. Ms. Villasana reports that she does not drink alcohol.  Review of Systems Complete review of systems are found to be negative unless outlined in H&P above. Chronic shortness of breath, occasional cough. Also anxiety.  Physical Examination Blood pressure 152/70, pulse 64, temperature 97.5 F (36.4 C), temperature source Oral, resp. rate  18, height 5\' 7"  (1.702 m), weight 220 lb (99.791 kg), SpO2 96 %.  Intake/Output Summary (Last 24 hours) at 05/29/15 1106 Last data filed at 05/29/15 0800  Gross per 24 hour  Intake    725 ml  Output      0 ml  Net    725 ml    Telemetry: Sinus rhythm.  GEN: Appears comfortable at rest. HEENT: Conjunctiva and lids normal, oropharynx clear. Neck: Supple, no elevated JVP or carotid bruits, no thyromegaly. Lungs: Coarse rhonchi, nonlabored breathing at rest. Cardiac: Regular rate and rhythm, no S3, soft systolic murmur, no pericardial rub. Abdomen: Soft, nontender, bowel sounds present, no guarding or rebound. Extremities: No pitting edema, distal pulses 2+. Skin: Warm and dry. Musculoskeletal: No kyphosis. Neuropsychiatric: Alert and oriented x3, affect grossly appropriate.  Lab Results  Basic Metabolic Panel:  Recent Labs Lab 05/28/15 1538 05/29/15 0801  NA 139 141  K 4.7 4.5  CL 113* 111  CO2 17* 21*  GLUCOSE 95 101*  BUN 43* 36*  CREATININE 2.76* 2.09*  CALCIUM 8.4* 8.3*    Liver Function Tests:  Recent Labs Lab 05/28/15 1538  AST 16  ALT 13*  ALKPHOS 81  BILITOT 0.3  PROT 8.0  ALBUMIN 4.2    CBC:  Recent Labs Lab 05/28/15 1538 05/29/15 0801  WBC 8.0 6.3  NEUTROABS 5.0  --   HGB 14.0 13.4  HCT 44.7 42.6  MCV 102.3* 100.9*  PLT 332 420*    Cardiac Enzymes:  Recent Labs Lab 05/28/15 1538 05/28/15 2300 05/29/15 0801  CKTOTAL  --  150  --   TROPONINI 0.08* <0.03 <0.03    BNP: 38  Radiology: Chest x-ray 05/28/2015: FINDINGS: Stable cardiomediastinal silhouette. No pneumothorax is noted. Stable elevation of right hemidiaphragm is noted. Mild right basilar subsegmental atelectasis is noted. Mild central pulmonary vascular congestion is noted. Bony thorax is intact.  IMPRESSION: Stable elevation of right hemidiaphragm with associated mild right basilar subsegmental atelectasis. Mild central pulmonary vascular congestion is  noted.   ECG: Sinus bradycardia.   Impression:   1. Chronic history of recurrent chest pain with fairly atypical features. ECG shows no acute ST segment changes. Initial minimal increase in troponin I is likely aberrant with subsequently normal levels. Cardiac risk factors include hypertension, reported history of previous stroke and hyperlipidemia.  2. Essential hypertension, antihypertensive regimen being adjusted by primary team.  3. Acute encephalopathy, multifactorial with workup as per primary team. Head MRI pending.  4. Acute on chronic renal failure, creatinine down from 2.7-2.1.   Recommendations:  At this point would obtain an echocardiogram to assess cardiac structure and function, exclude any focal wall motion abnormalities. Would recommend working up and stabilizing encephalopathy as well as acute on chronic renal failure prior to pursuing any further cardiac evaluation. No clear evidence of ACS at this time. Eventually, a Lexiscan Cardiolite can be considered.  Signed: Phill Myron. Purcell Nails NP [place]  05/29/2015, 11:06 AM Co-Sign MD   Attending note:  Patient seen and examined. Reviewed extensive records, discussed the case with Ms. Lawrence NP. I modified the above note and completed the entire impression and recommendations sections which reflect my findings and plan. Ms. Juergensen denies any chest pain this morning. We will proceed with an echocardiogram as outlined. Further workup per primary team.  Satira Sark, M.D., F.A.C.C.

## 2015-05-29 NOTE — Care Management (Signed)
Important Message  Patient Details  Name: [name] MRN: [id number] Date of Birth: [birth year]   Medicare Important Message Given:  Yes-second notification given    Sherald Barge, RN 05/29/2015, 2:15 PM

## 2015-05-29 NOTE — Progress Notes (Signed)
Pt complaining of chest pain. States that it feels a "shooting pain and like somebody sitting on my chest. Pt also said that it was brief. Stat EKG showed no abnormalities. Pt placed on 2 liters of O2 and has no complaints at this time. MD made aware.

## 2015-05-30 ENCOUNTER — Inpatient Hospital Stay (HOSPITAL_COMMUNITY): Payer: Medicare Other

## 2015-05-30 DIAGNOSIS — R197 Diarrhea, unspecified: Secondary | ICD-10-CM

## 2015-05-30 DIAGNOSIS — N179 Acute kidney failure, unspecified: Principal | ICD-10-CM

## 2015-05-30 DIAGNOSIS — N183 Chronic kidney disease, stage 3 (moderate): Secondary | ICD-10-CM

## 2015-05-30 LAB — URINE CULTURE: Culture: NO GROWTH

## 2015-05-30 LAB — CBC
HCT: 42 % (ref 36.0–46.0)
Hemoglobin: 13.4 g/dL (ref 12.0–15.0)
MCH: 31.8 pg (ref 26.0–34.0)
MCHC: 31.9 g/dL (ref 30.0–36.0)
MCV: 99.8 fL (ref 78.0–100.0)
Platelets: 384 10*3/uL (ref 150–400)
RBC: 4.21 MIL/uL (ref 3.87–5.11)
RDW: 13.6 % (ref 11.5–15.5)
WBC: 7 10*3/uL (ref 4.0–10.5)

## 2015-05-30 LAB — BASIC METABOLIC PANEL
Anion gap: 8 (ref 5–15)
BUN: 23 mg/dL — ABNORMAL HIGH (ref 6–20)
CO2: 21 mmol/L — ABNORMAL LOW (ref 22–32)
Calcium: 8.7 mg/dL — ABNORMAL LOW (ref 8.9–10.3)
Chloride: 113 mmol/L — ABNORMAL HIGH (ref 101–111)
Creatinine, Ser: 1.51 mg/dL — ABNORMAL HIGH (ref 0.44–1.00)
GFR calc Af Amer: 41 mL/min — ABNORMAL LOW (ref >60)
GFR calc non Af Amer: 36 mL/min — ABNORMAL LOW (ref >60)
Glucose, Bld: 101 mg/dL — ABNORMAL HIGH (ref 65–99)
Potassium: 4.4 mmol/L (ref 3.5–5.1)
Sodium: 142 mmol/L (ref 135–145)

## 2015-05-30 LAB — GRAM STAIN: Gram Stain: NONE SEEN

## 2015-05-30 LAB — CSF CELL COUNT WITH DIFFERENTIAL
RBC Count, CSF: 1 /mm3 — ABNORMAL HIGH
Tube #: 3
WBC, CSF: 2 /mm3 (ref 0–5)

## 2015-05-30 LAB — PROTEIN, CSF: Total  Protein, CSF: 30 mg/dL (ref 15–45)

## 2015-05-30 LAB — RPR: RPR Ser Ql: NONREACTIVE

## 2015-05-30 LAB — HIV ANTIBODY (ROUTINE TESTING W REFLEX): HIV Screen 4th Generation wRfx: NONREACTIVE

## 2015-05-30 LAB — GLUCOSE, CSF: Glucose, CSF: 67 mg/dL (ref 40–70)

## 2015-05-30 MED ORDER — LIDOCAINE HCL (PF) 1 % IJ SOLN
INTRAMUSCULAR | Status: AC
  Filled 2015-05-30: qty 10

## 2015-05-30 MED ORDER — LORAZEPAM 1 MG PO TABS
1.0000 mg | ORAL_TABLET | Freq: Two times a day (BID) | ORAL | Status: DC
  Administered 2015-05-30 – 2015-05-31 (×3): 1 mg via ORAL
  Filled 2015-05-30 (×3): qty 1

## 2015-05-30 MED ORDER — METHYLPREDNISOLONE SODIUM SUCC 1000 MG IJ SOLR
INTRAMUSCULAR | Status: AC
  Filled 2015-05-30: qty 8

## 2015-05-30 MED ORDER — POVIDONE-IODINE 10 % EX SOLN
CUTANEOUS | Status: AC
  Filled 2015-05-30: qty 60

## 2015-05-30 MED ORDER — HYDRALAZINE HCL 25 MG PO TABS
50.0000 mg | ORAL_TABLET | Freq: Three times a day (TID) | ORAL | Status: DC
  Administered 2015-05-30 – 2015-05-31 (×4): 50 mg via ORAL
  Filled 2015-05-30 (×4): qty 2

## 2015-05-30 NOTE — Procedures (Signed)
Preprocedure Dx: Metabolic encephalopathy, Limbic encephalitis Postprocedure Dx: Metabolic encephalopathy, Limbic encephalitis Procedure:  Fluoroscopically guided lumbar puncture Radiologist:  Thornton Papas Anesthesia:  2 ml of 1% lidocaine Specimen:  10.5 ml CSF, clear colorless EBL:   None Opening pressure: 2 cm H2O (measured prone) Complications: None

## 2015-05-30 NOTE — Progress Notes (Signed)
SATURATION QUALIFICATIONS: (This note is used to comply with regulatory documentation for home oxygen)  Patient Saturations on Room Air at Rest = 100%  Patient Saturations on Room Air while Ambulating =94%  Patient Saturations on  Liters of oxygen while Ambulating =   Please briefly explain why patient needs home oxygen: 

## 2015-05-30 NOTE — Progress Notes (Signed)
[facility] PROGRESS NOTE  [name] [id number] DOB: [birth year] DOA: 05/28/2015 PCP: Chevis Pretty, FNP  Brief Summary  64 year old female with a history of MGUS, restrictive lung disease, hypertension, seizure, depression, CKD stage III, hyperlipidemia, hypertension presented with one-week history of increasing confusion and lethargy. The patient's family stated that she has been "talking out of her head" and had had worsening gait instability and weakness.   She also reported intermittent moderate substernal chest pressure both at rest and with exertion, associated with SOB, nausea, diaphoresis.  The patient has had a workup for shortness of breath and has seen a pulmonologist who felt that her dyspnea was multifactorial including restrictive lung disease in the setting of OHS and paralyzed right hemidiaphragm.  The patient has had a gradual functional as well as cognitive decline over the past year and had a recent admission to [facility] for acute encephalopathy.  It was felt that her encephalopathy was multifactorial including her medications and metabolic. In the emergency department, creatinine 2.76.   Assessment/Plan  Acute encephalopathy, likely secondary to multiple sedating medications in the setting of acute kidney injury. Her mentation quickly improved after stopping or reducing the doses of most of her sedating medications. She underwent MRI of the brain to exclude stroke which demonstrated limbic encephalitis. The differential diagnosis includes infectious, autoimmune, and malignant etiologies.  She was seen by neurology who recommended lumbar puncture. She was also started on IV Solu-Medrol on 7/1 to complete a 3 day burst.  I discussed with her giving her her third dose of Solu-Medrol early on Sunday so that she can be discharged in the late afternoon. 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 -TSH 0.22 >> free T4 ordered - B12 599 -  folate pending - RPR nonreactive, HIV nonreactive - Continue to hold baclofen, Seroquel, Ambien -  Increase Ativan to home dose to prevent withdrawal (shaking this morning) -  Continue reduced dose gabapentin -  Neurology consultation:  Appreciate assistance and agree with work up  -  Radiology to perform lumbar puncture this morning -  Day 2 of 3 of Solu-Medrol -  Outpatient CT chest, mammography, and ultrasound of the ovaries recommended by neurology to screen for underlying malignancy  Atypical chest pain, does have chest pain with palpation but not the same as her pressure like pain.   -  Elevated troponin was likely secondary to AKI - ECHO with normal EF and no regional wall motion abnormalities -  Possible outpatient stress test   Acute on chronic renal failure (CKD stage 3), resolved -Baseline creatinine 1.7-1.8 -Volume depletion a setting of NSAID use  - continue to hold Mobic and losartan - Discontinue IV fluids   Diarrhea,  none since admission -No recent antibiotics  -Discontinue C. difficile PCR  -Suspect the patient has underlying irritable bowel syndrome   Hypertension, blood pressure elevated -Continue to hold losartan -Continue metoprolol tartrate, amlodipine, clonidine - add prn hydralazine  Depression, stable, continue Cymbalta   Seizure disorder  - Continue Keppra  -  Continuegabapentin at 300mg  QHS  Low TSH - fT4 within normal limits, recommend follow-up TFTs in approximately 4 weeks   Diet:  Healthy heart Access:  PIV IVF:  yes Proph:  lovenox  Code Status: full Family Communication: patient and her son Disposition Plan:  Anticipate discharge to home tomorrow after her dose of Solu-Medrol.   Consultants:  Cardiology   neurology  Procedures:  CXR:  Mild central pulmonary vascular congestion and  stable elevation of the right hemidiaphragm  CT head:  Normal head CT   MRI brain 7/1:  Bilateral abnormal medial temporal lobe  restricted diffusion and prolonged T2 signal consistent with limbic encephalitis. Superimposed small vessel disease  ECHO  demonstrated ejection fraction of 65-70% with moderate focal basal hypertrophy of the septum and grade 1 diastolic dysfunction. She had no regional wall motion abnormalities. She had mild TR and a suggestion of mildly increased pulmonary pressures.  Antibiotics:  none   HPI/Subjective:  States that her thinking remains clear. She denies any diarrhea since admission. She still has some shortness of breath with exertion. Denies chest pains this morning.  Objective: Filed Vitals:   05/29/15 0801 05/29/15 1322 05/29/15 2043 05/30/15 0628  BP:  146/73  163/80  Pulse:  72  75  Temp:  97.6 F (36.4 C)  98 F (36.7 C)  TempSrc:  Oral  Oral  Resp:  18  18  Height:      Weight:      SpO2: 96% 98% 96% 97%    Intake/Output Summary (Last 24 hours) at 05/30/15 0814 Last data filed at 05/30/15 0500  Gross per 24 hour  Intake 2090.5 ml  Output   1850 ml  Net  240.5 ml   Filed Weights   05/28/15 1328  Weight: 99.791 kg (220 lb)    Exam:   General:  Adult female, No acute distress  HEENT:  NCAT, MMM  Cardiovascular:  RRR, nl S1, S2 no mrg, 2+ pulses, warm extremities  Respiratory:  Diminished bilateral breath sounds, particularly at the right base, no obvious wheeze or rhonchi, no increased WOB  Abdomen:   NABS, soft, NT/ND  MSK:   Trace bilateral pitting LEE  Neuro:  Grossly intact  Data Reviewed: Basic Metabolic Panel:  Recent Labs Lab 05/28/15 1538 05/29/15 0801 05/30/15 0705  NA 139 141 142  K 4.7 4.5 4.4  CL 113* 111 113*  CO2 17* 21* 21*  GLUCOSE 95 101* 101*  BUN 43* 36* 23*  CREATININE 2.76* 2.09* 1.51*  CALCIUM 8.4* 8.3* 8.7*   Liver Function Tests:  Recent Labs Lab 05/28/15 1538  AST 16  ALT 13*  ALKPHOS 81  BILITOT 0.3  PROT 8.0  ALBUMIN 4.2   No results for input(s): LIPASE, AMYLASE in the last 168 hours.  Recent  Labs Lab 05/28/15 1538 05/29/15 0801  AMMONIA 30 11   CBC:  Recent Labs Lab 05/28/15 1538 05/29/15 0801 05/30/15 0705  WBC 8.0 6.3 7.0  NEUTROABS 5.0  --   --   HGB 14.0 13.4 13.4  HCT 44.7 42.6 42.0  MCV 102.3* 100.9* 99.8  PLT 332 420* 384   Cardiac Enzymes:  Recent Labs Lab 05/28/15 1538 05/28/15 2300 05/29/15 0801 05/29/15 1523  CKTOTAL  --  150  --   --   TROPONINI 0.08* <0.03 <0.03 <0.03   BNP (last 3 results)  Recent Labs  05/28/15 2103  BNP 38.0    ProBNP (last 3 results) No results for input(s): PROBNP in the last 8760 hours.  CBG: No results for input(s): GLUCAP in the last 168 hours.  Recent Results (from the past 240 hour(s))  Urine culture     Status: None (Preliminary result)   Collection Time: 05/28/15  5:00 PM  Result Value Ref Range Status   Specimen Description URINE, CATHETERIZED  Final   Special Requests NONE  Final   Culture   Final    NO GROWTH < 24  HOURS Performed at [facility]    Report Status PENDING  Incomplete     Studies: Dg Chest 2 View  05/28/2015   CLINICAL DATA:  Chest pain and shortness of breath.  EXAM: CHEST  2 VIEW  COMPARISON:  Apr 11, 2015.  FINDINGS: Stable cardiomediastinal silhouette. No pneumothorax is noted. Stable elevation of right hemidiaphragm is noted. Mild right basilar subsegmental atelectasis is noted. Mild central pulmonary vascular congestion is noted. Bony thorax is intact.  IMPRESSION: Stable elevation of right hemidiaphragm with associated mild right basilar subsegmental atelectasis. Mild central pulmonary vascular congestion is noted.   Electronically Signed   By: Marijo Conception, M.D.   On: 05/28/2015 15:29   Ct Head Wo Contrast  05/28/2015   CLINICAL DATA:  Headache for 2 weeks.  EXAM: CT HEAD WITHOUT CONTRAST  TECHNIQUE: Contiguous axial images were obtained from the base of the skull through the vertex without intravenous contrast.  COMPARISON:  CT scan of February 14, 2013.  FINDINGS:  Bony calvarium appears intact. No mass effect or midline shift is noted. Ventricular size is within normal limits. There is no evidence of mass lesion, hemorrhage or acute infarction.  IMPRESSION: Normal head CT.   Electronically Signed   By: Marijo Conception, M.D.   On: 05/28/2015 17:45   Mr Brain Wo Contrast  05/29/2015   CLINICAL DATA:  Altered mental status, with increasing confusion and lethargy. Acute encephalopathy. Acute on chronic renal failure. MGUS. Worsening gait instability and weakness for the past several weeks.  EXAM: MRI HEAD WITHOUT CONTRAST  TECHNIQUE: Multiplanar, multiecho pulse sequences of the brain and surrounding structures were obtained without intravenous contrast.  COMPARISON:  CT head 05/28/2015.  FINDINGS: BILATERAL abnormal restricted diffusion in the medial and posterior temporal lobes extending to the fornix. These are accompanied by abnormal T2 and FLAIR hyperintensity. No associated hemorrhage. No other abnormal areas of restriction.  Slight atrophy. Mild subcortical and periventricular T2 and FLAIR hyperintensities, likely chronic microvascular ischemic change.  Flow voids are maintained throughout the carotid, basilar, and vertebral arteries. There are no areas of chronic hemorrhage.  Pituitary, pineal, and cerebellar tonsils unremarkable. No upper cervical lesions.  Visualized calvarium, skull base, and upper cervical osseous structures unremarkable. Scalp and extracranial soft tissues, orbits, sinuses, and mastoids show no acute process.  IMPRESSION: BILATERAL abnormal medial temporal lobe restricted diffusion and prolonged T2 signal is most consistent with limbic encephalitis. There are no features to strongly suggest herpes encephalitis, but correlation with laboratory values, including lumbar puncture if indicated, could provide additional information. No features to suggest temporal lobe neoplasm.  Mild superimposed subcortical and periventricular white matter signal  abnormality consistent with small vessel disease.  A call is into the ordering provider.   Electronically Signed   By: Staci Righter M.D.   On: 05/29/2015 10:56    Scheduled Meds: . amLODipine  5 mg Oral Daily  . aspirin EC  81 mg Oral Daily  . atorvastatin  80 mg Oral q1800  . calcitRIOL  0.5 mcg Oral Daily  . calcium acetate  667 mg Oral TID WC  . cholecalciferol  1,000 Units Oral Daily  . cloNIDine  0.3 mg Transdermal Weekly  . DULoxetine  60 mg Oral Daily  . ferrous sulfate  325 mg Oral Q breakfast  . gabapentin  300 mg Oral QHS  . hydrALAZINE  25 mg Oral TID  . ipratropium-albuterol  3 mL Nebulization BID  . levETIRAcetam  500 mg Oral BID  .  lidocaine (PF)      . LORazepam  0.5 mg Oral BID  . methylPREDNISolone (SOLU-MEDROL) injection  1,000 mg Intravenous Q24H  . metoprolol  50 mg Oral BID  . pantoprazole  40 mg Oral Daily  . povidone-iodine      . sodium chloride  3 mL Intravenous Q12H   Continuous Infusions: . sodium chloride 75 mL/hr at 05/30/15 0500    Active Problems:   Essential hypertension   Depression   MGUS (monoclonal gammopathy of unknown significance)   Diaphragm paralysis   Diarrhea   Acute encephalopathy   Acute renal failure superimposed on stage 3 chronic kidney disease   Malnutrition of moderate degree    Time spent: 30 min    Paris [place], [place]  [facility] Pager [phone]. If 7PM-7AM, please contact night-coverage at [email or web address], password [facility] 05/30/2015, 8:14 AM  LOS: 2 days

## 2015-05-31 DIAGNOSIS — G9340 Encephalopathy, unspecified: Secondary | ICD-10-CM

## 2015-05-31 LAB — BASIC METABOLIC PANEL
Anion gap: 10 (ref 5–15)
BUN: 17 mg/dL (ref 6–20)
CO2: 20 mmol/L — ABNORMAL LOW (ref 22–32)
Calcium: 9 mg/dL (ref 8.9–10.3)
Chloride: 108 mmol/L (ref 101–111)
Creatinine, Ser: 1.37 mg/dL — ABNORMAL HIGH (ref 0.44–1.00)
GFR calc Af Amer: 46 mL/min — ABNORMAL LOW (ref >60)
GFR calc non Af Amer: 40 mL/min — ABNORMAL LOW (ref >60)
Glucose, Bld: 165 mg/dL — ABNORMAL HIGH (ref 65–99)
Potassium: 4.8 mmol/L (ref 3.5–5.1)
Sodium: 138 mmol/L (ref 135–145)

## 2015-05-31 LAB — CBC
HCT: 44.7 % (ref 36.0–46.0)
Hemoglobin: 14.3 g/dL (ref 12.0–15.0)
MCH: 31.6 pg (ref 26.0–34.0)
MCHC: 32 g/dL (ref 30.0–36.0)
MCV: 98.7 fL (ref 78.0–100.0)
Platelets: 456 10*3/uL — ABNORMAL HIGH (ref 150–400)
RBC: 4.53 MIL/uL (ref 3.87–5.11)
RDW: 13.5 % (ref 11.5–15.5)
WBC: 7.2 10*3/uL (ref 4.0–10.5)

## 2015-05-31 LAB — HERPES SIMPLEX VIRUS(HSV) DNA BY PCR
HSV 1 DNA: NEGATIVE
HSV 2 DNA: NEGATIVE

## 2015-05-31 MED ORDER — HYDRALAZINE HCL 50 MG PO TABS: 50.0000 mg | ORAL_TABLET | Freq: Three times a day (TID) | ORAL | Status: DC

## 2015-05-31 MED ORDER — GABAPENTIN 300 MG PO CAPS: 300.0000 mg | ORAL_CAPSULE | Freq: Every day | ORAL | Status: DC

## 2015-05-31 NOTE — Discharge Summary (Signed)
Physician Discharge Summary  [name] ULA:[id number] DOB: Jun 14, 1951 DOA: 05/28/2015  PCP: Chevis Pretty, FNP  Admit date: 05/28/2015 Discharge date: 05/31/2015  Time spent: 45 minutes  Recommendations for Outpatient Follow-up:  -Will be discharged home today. -Advised to follow-up with PCP in one week and with Dr. Merlene Laughter in 2 weeks.   Discharge Diagnoses:  Active Problems:   Essential hypertension   Depression   MGUS (monoclonal gammopathy of unknown significance)   Diaphragm paralysis   Diarrhea   Acute encephalopathy   Acute renal failure superimposed on stage 3 chronic kidney disease   Malnutrition of moderate degree   Discharge Condition: Stable and improved  Filed Weights   05/28/15 1328  Weight: 99.791 kg (220 lb)    History of present illness:  64 year old female with a history of MGUS, restrictive lung disease, hypertension, seizure, depression, CKD stage III, hyperlipidemia, hypertension presents with one-week history of increasing confusion and lethargy. The patient's family states that she has been "talking out of her head". In addition, the patient locked herself out of her house yesterday. The patient has had intermittent chest discomfort mostly at rest, but also with exertion. She is also having some shortness of breath mostly with activity. The patient has had a workup for shortness of breath in the past and has seen a pulmonologist. It was felt that her dyspnea was multifactorial including restrictive lung disease in the setting of OHS and paralyzed right hemidiaphragm. In addition, the patient has had generalized weakness and unsteady gait resulting in mechanical falls 2 this past week. She denies any syncope, dizziness, visual disturbance, focal extremity weakness. She has had loose stools for the past 2-3 days without any hematochezia or melena. She has some nausea without vomiting. According to the patient's son at the bedside. The patient has  had a gradual functional as well as cognitive decline over the past year. The patient had a recent admission to [facility] for acute encephalopathy for which she had a workup. It was felt that her encephalopathy was multifactorial including her medications and metabolic. In the emergency department, the patient was afebrile and hemodynamically stable. Oxygen saturation was 98-100 percent on room air. CBC was unremarkable. BMP showed serum creatinine 2.76. Hepatic enzymes were negative. Lactic acid was 2.36. Urinalysis was negative. Troponin was 0.08.   Hospital Course:   Acute encephalopathy -Likely secondary to multiple sedating medications in the setting of acute renal failure. -Her mentation quickly improved after stopping or reducing the doses of most of her sedating medications. 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 Differential diagnosis includes infectious, autoimmune, malignant etiologies. -She was seen by neurology, Dr. Merlene Laughter who recommended lumbar puncture results of which are not indicative for acute infection, cultures are obviously pending at time of discharge. -He recommended a three-day burst of IV Solu-Medrol. She will receive her third dose today. -He has also recommended she have a CT scan of the chest, mammogram and ultrasound of the ovaries as an outpatient to screen for underlying malignancy.  Elevated troponin -EKG without acute ischemic abnormalities, 2-D echo with normal ejection fraction and no regional wall motion abnormalities. -No further cardiac workup.  Hypertension -Controlled.  Seizure disorder -Continue Keppra. -No active seizures while in the hospital.  Procedures:  None   Consultations:  Neurology, Dr. Merlene Laughter  Discharge Instructions  Discharge Instructions    Diet - low sodium heart healthy    Complete by:  As directed  Increase activity slowly    Complete by:  As directed               Medication List    STOP taking these medications        baclofen 10 MG tablet  Commonly known as:  LIORESAL     meloxicam 15 MG tablet  Commonly known as:  MOBIC     zolpidem 10 MG tablet  Commonly known as:  AMBIEN      TAKE these medications        amLODipine 5 MG tablet  Commonly known as:  NORVASC  Take 1 tablet (5 mg total) by mouth daily.     aspirin EC 81 MG tablet  Take 81 mg by mouth daily.     calcitRIOL 0.5 MCG capsule  Commonly known as:  ROCALTROL  Take 0.5 mcg by mouth daily.     calcium acetate 667 MG capsule  Commonly known as:  PHOSLO  Take 1 capsule by mouth 3 (three) times daily.     cholecalciferol 1000 UNITS tablet  Commonly known as:  VITAMIN D  Take 1,000 Units by mouth daily.     cloNIDine 0.3 mg/24hr patch  Commonly known as:  CATAPRES - Dosed in mg/24 hr  Place 1 patch (0.3 mg total) onto the skin once a week.     DULoxetine 60 MG capsule  Commonly known as:  CYMBALTA  Take 60 mg by mouth daily.     ferrous sulfate 325 (65 FE) MG tablet  Take 325 mg by mouth daily with breakfast.     gabapentin 300 MG capsule  Commonly known as:  NEURONTIN  Take 1 capsule (300 mg total) by mouth at bedtime.     hydrALAZINE 50 MG tablet  Commonly known as:  APRESOLINE  Take 1 tablet (50 mg total) by mouth 3 (three) times daily.  Notes to Patient:  2 MORE DOSES DUE TODAY     Ipratropium-Albuterol 20-100 MCG/ACT Aers respimat  Commonly known as:  COMBIVENT RESPIMAT  2 puffs twice a day     levETIRAcetam 500 MG tablet  Commonly known as:  KEPPRA  TAKE 1 TABLET BY MOUTH TWICE DAILY     LORazepam 1 MG tablet  Commonly known as:  ATIVAN  Take 1 tablet (1 mg total) by mouth 2 (two) times daily.     losartan 25 MG tablet  Commonly known as:  COZAAR  Take 1 tablet (25 mg total) by mouth daily.     metoprolol 50 MG tablet  Commonly known as:  LOPRESSOR  Take 1 tablet (50 mg total) by mouth 2 (two) times daily.     ondansetron 4 MG  disintegrating tablet  Commonly known as:  ZOFRAN ODT  Take 1 tablet (4 mg total) by mouth every 8 (eight) hours as needed for nausea or vomiting.  Notes to Patient:  TAKE AS NEEDED     pantoprazole 40 MG tablet  Commonly known as:  PROTONIX  Take 1 tablet (40 mg total) by mouth daily.     simvastatin 10 MG tablet  Commonly known as:  ZOCOR  TAKE 1 TABLET BY MOUTH ONCE DAILY       Allergies  Allergen Reactions  . Asa [Aspirin]     High dose Aspirin causes itching but can take 45m   . Penicillins Hives  . Sulfonamide Derivatives Hives  . Tylenol With Codeine #3 [Acetaminophen-Codeine] Itching       Follow-up Information  Follow up with [facility].   Contact information:   [address] [place] [address] [phone]       Follow up with [facility], KOFI, MD. Schedule an appointment as soon as possible for a visit in 2 weeks.   Specialty:  Neurology   Contact information:   [address] [place] [address] [phone]       Follow up with Chevis Pretty, FNP. Schedule an appointment as soon as possible for a visit in 1 week.   Specialty:  Nurse Practitioner   Contact information:   [place] [address] [phone]        The results of significant diagnostics from this hospitalization (including imaging, microbiology, ancillary and laboratory) are listed below for reference.    Significant Diagnostic Studies: Dg Chest 2 View  05/28/2015   CLINICAL DATA:  Chest pain and shortness of breath.  EXAM: CHEST  2 VIEW  COMPARISON:  Apr 11, 2015.  FINDINGS: Stable cardiomediastinal silhouette. No pneumothorax is noted. Stable elevation of right hemidiaphragm is noted. Mild right basilar subsegmental atelectasis is noted. Mild central pulmonary vascular congestion is noted. Bony thorax is intact.  IMPRESSION: Stable elevation of right hemidiaphragm with associated mild right basilar subsegmental  atelectasis. Mild central pulmonary vascular congestion is noted.   Electronically Signed   By: Marijo Conception, M.D.   On: 05/28/2015 15:29   Ct Head Wo Contrast  05/28/2015   CLINICAL DATA:  Headache for 2 weeks.  EXAM: CT HEAD WITHOUT CONTRAST  TECHNIQUE: Contiguous axial images were obtained from the base of the skull through the vertex without intravenous contrast.  COMPARISON:  CT scan of February 14, 2013.  FINDINGS: Bony calvarium appears intact. No mass effect or midline shift is noted. Ventricular size is within normal limits. There is no evidence of mass lesion, hemorrhage or acute infarction.  IMPRESSION: Normal head CT.   Electronically Signed   By: Marijo Conception, M.D.   On: 05/28/2015 17:45   Mr Brain Wo Contrast  05/29/2015   CLINICAL DATA:  Altered mental status, with increasing confusion and lethargy. Acute encephalopathy. Acute on chronic renal failure. MGUS. Worsening gait instability and weakness for the past several weeks.  EXAM: MRI HEAD WITHOUT CONTRAST  TECHNIQUE: Multiplanar, multiecho pulse sequences of the brain and surrounding structures were obtained without intravenous contrast.  COMPARISON:  CT head 05/28/2015.  FINDINGS: BILATERAL abnormal restricted diffusion in the medial and posterior temporal lobes extending to the fornix. These are accompanied by abnormal T2 and FLAIR hyperintensity. No associated hemorrhage. No other abnormal areas of restriction.  Slight atrophy. Mild subcortical and periventricular T2 and FLAIR hyperintensities, likely chronic microvascular ischemic change.  Flow voids are maintained throughout the carotid, basilar, and vertebral arteries. There are no areas of chronic hemorrhage.  Pituitary, pineal, and cerebellar tonsils unremarkable. No upper cervical lesions.  Visualized calvarium, skull base, and upper cervical osseous structures unremarkable. Scalp and extracranial soft tissues, orbits, sinuses, and mastoids show no acute process.  IMPRESSION:  BILATERAL abnormal medial temporal lobe restricted diffusion and prolonged T2 signal is most consistent with limbic encephalitis. There are no features to strongly suggest herpes encephalitis, but correlation with laboratory values, including lumbar puncture if indicated, could provide additional information. No features to suggest temporal lobe neoplasm.  Mild superimposed subcortical and periventricular white matter signal abnormality consistent with small vessel disease.  A call is into the ordering provider.   Electronically Signed  By: Staci Righter M.D.   On: 05/29/2015 10:56   Dg Bone Survey Met  05/21/2015   CLINICAL DATA:  Monoclonal gammopathy  EXAM: METASTATIC BONE SURVEY  COMPARISON:  Metastatic bone survey of 11/14/2012, and chest x-ray of 04/11/2015  FINDINGS: No active infiltrate or effusion is seen. There is chronic elevation of the right hemidiaphragm with right basilar atelectasis present. Mild cardiomegaly is stable.  A lateral view the skull shows no radiolucent lesion. The cervical vertebrae remain somewhat straightened alignment with degenerative disc disease rim C3 to C6 with loss of disc space and sclerosis.  The thoracic vertebrae are in normal alignment. No compression deformity is seen.  The lumbar vertebrae remain in normal alignment with mild degenerative change diffusely. No compression deformity is seen.  Views of both shoulders and both humerus show no radiolucent lesion.  In view of the pelvis shows no radiolucent lesion. The hips are in normal position. The pelvic rami are intact.  Views of both hips and femurs show no radiolucent lesion.  Views of both tibia and fibula as well as the forearms show no radiolucent lesions.  IMPRESSION: No evidence of multiple myeloma is seen by plain film. There are diffuse degenerative changes throughout the cervical, thoracic, and lumbar spine.   Electronically Signed   By: Ivar Drape M.D.   On: 05/21/2015 15:51   Dg Fluoro Guide Lumbar  Puncture  05/30/2015   CLINICAL DATA:  Abnormal MRI question limbic encephalitis, metabolic encephalopathy, seizures, hypertension  EXAM: DIAGNOSTIC LUMBAR PUNCTURE UNDER FLUOROSCOPIC GUIDANCE  FLUOROSCOPY TIME:  Radiation Exposure Index (as provided by the fluoroscopic device): Not provided  If the device does not provide the exposure index:  Fluoroscopy Time (in minutes and seconds):  0 minutes 30 seconds  Number of Acquired Images: None; 3 screen captures obtained during fluoroscopy  PROCEDURE: Procedure, benefits, and risks were discussed with the patient, including alternatives.  Patient's questions were answered.  Written informed consent was obtained.  Timeout protocol followed.  Patient placed prone.  L4-L5 disc space was localized under fluoroscopy.  Skin prepped and draped in usual sterile fashion.  Skin and soft tissues anesthetized with 2 mL of 1% lidocaine.  Twenty-two needle was advanced into the spinal canal where clear colorless CSF was encountered with an opening pressure of 2 cm H2O (prone).  10.5 mL of CSF was obtained in 4 tubes for requested analysis.  Procedure tolerated very well by patient without immediate complication.  IMPRESSION: Technically successful fluoroscopic guided lumbar puncture as above.   Electronically Signed   By: Lavonia Dana M.D.   On: 05/30/2015 10:23    Microbiology: Recent Results (from the past 240 hour(s))  Urine culture     Status: None   Collection Time: 05/28/15  5:00 PM  Result Value Ref Range Status   Specimen Description URINE, CATHETERIZED  Final   Special Requests NONE  Final   Culture   Final    NO GROWTH 2 DAYS Performed at [facility]    Report Status 05/30/2015 FINAL  Final  CSF culture with Stat gram stain     Status: None (Preliminary result)   Collection Time: 05/30/15  9:43 AM  Result Value Ref Range Status   Specimen Description CSF  Final   Special Requests Normal  Final   Gram Stain NO ORGANISMS SEEN CYTOSPIN SMEAR    Final   Culture   Final    NO GROWTH < 24 HOURS Performed at [facility]  Report Status PENDING  Incomplete  Gram stain     Status: None   Collection Time: 05/30/15  9:43 AM  Result Value Ref Range Status   Specimen Description CSF  Final   Special Requests NONE  Final   Gram Stain NO ORGANISMS SEEN  Final   Report Status 05/30/2015 FINAL  Final     Labs: Basic Metabolic Panel:  Recent Labs Lab 05/28/15 1538 05/29/15 0801 05/30/15 0705 05/31/15 0639  NA 139 141 142 138  K 4.7 4.5 4.4 4.8  CL 113* 111 113* 108  CO2 17* 21* 21* 20*  GLUCOSE 95 101* 101* 165*  BUN 43* 36* 23* 17  CREATININE 2.76* 2.09* 1.51* 1.37*  CALCIUM 8.4* 8.3* 8.7* 9.0   Liver Function Tests:  Recent Labs Lab 05/28/15 1538  AST 16  ALT 13*  ALKPHOS 81  BILITOT 0.3  PROT 8.0  ALBUMIN 4.2   No results for input(s): LIPASE, AMYLASE in the last 168 hours.  Recent Labs Lab 05/28/15 1538 05/29/15 0801  AMMONIA 30 11   CBC:  Recent Labs Lab 05/28/15 1538 05/29/15 0801 05/30/15 0705 05/31/15 0639  WBC 8.0 6.3 7.0 7.2  NEUTROABS 5.0  --   --   --   HGB 14.0 13.4 13.4 14.3  HCT 44.7 42.6 42.0 44.7  MCV 102.3* 100.9* 99.8 98.7  PLT 332 420* 384 456*   Cardiac Enzymes:  Recent Labs Lab 05/28/15 1538 05/28/15 2300 05/29/15 0801 05/29/15 1523  CKTOTAL  --  150  --   --   TROPONINI 0.08* <0.03 <0.03 <0.03   BNP: BNP (last 3 results)  Recent Labs  05/28/15 2103  BNP 38.0    ProBNP (last 3 results) No results for input(s): PROBNP in the last 8760 hours.  CBG: No results for input(s): GLUCAP in the last 168 hours.     SignedLelon Frohlich  [facility] Pager: [phone] 05/31/2015, 12:18 PM

## 2015-05-31 NOTE — Progress Notes (Signed)
[facility] notified patient discharged and order fax.

## 2015-05-31 NOTE — Progress Notes (Signed)
Discharge instruction reviewed with patient. Prescription given to patient. Son at bedside. Going home after medication completed.

## 2015-06-02 ENCOUNTER — Telehealth: Payer: Self-pay | Admitting: Nurse Practitioner

## 2015-06-02 LAB — ANTINUCLEAR ANTIBODIES, IFA: ANA Ab, IFA: NEGATIVE

## 2015-06-03 DIAGNOSIS — J986 Disorders of diaphragm: Secondary | ICD-10-CM | POA: Diagnosis not present

## 2015-06-03 DIAGNOSIS — F329 Major depressive disorder, single episode, unspecified: Secondary | ICD-10-CM | POA: Diagnosis not present

## 2015-06-03 DIAGNOSIS — I10 Essential (primary) hypertension: Secondary | ICD-10-CM | POA: Diagnosis not present

## 2015-06-03 DIAGNOSIS — D472 Monoclonal gammopathy: Secondary | ICD-10-CM | POA: Diagnosis not present

## 2015-06-03 LAB — CSF CULTURE W GRAM STAIN
Culture: NO GROWTH
Gram Stain: NONE SEEN

## 2015-06-03 LAB — CSF CULTURE: Special Requests: NORMAL

## 2015-06-03 LAB — OLIGOCLONAL BANDS, CSF + SERM

## 2015-06-03 LAB — FOLATE RBC
Folate, Hemolysate: 291.3 ng/mL
Folate, RBC: 712 ng/mL (ref >498)
Hematocrit: 40.9 % (ref 34.0–46.6)

## 2015-06-04 DIAGNOSIS — F329 Major depressive disorder, single episode, unspecified: Secondary | ICD-10-CM | POA: Diagnosis not present

## 2015-06-04 DIAGNOSIS — J986 Disorders of diaphragm: Secondary | ICD-10-CM | POA: Diagnosis not present

## 2015-06-04 DIAGNOSIS — I10 Essential (primary) hypertension: Secondary | ICD-10-CM | POA: Diagnosis not present

## 2015-06-04 DIAGNOSIS — D472 Monoclonal gammopathy: Secondary | ICD-10-CM | POA: Diagnosis not present

## 2015-06-05 DIAGNOSIS — F329 Major depressive disorder, single episode, unspecified: Secondary | ICD-10-CM | POA: Diagnosis not present

## 2015-06-05 DIAGNOSIS — D472 Monoclonal gammopathy: Secondary | ICD-10-CM | POA: Diagnosis not present

## 2015-06-05 DIAGNOSIS — J986 Disorders of diaphragm: Secondary | ICD-10-CM | POA: Diagnosis not present

## 2015-06-05 DIAGNOSIS — I10 Essential (primary) hypertension: Secondary | ICD-10-CM | POA: Diagnosis not present

## 2015-06-05 NOTE — Telephone Encounter (Signed)
Hospital follow up appointment scheduled for Wed @ 3:15 with Ronnald Collum, FNP.

## 2015-06-09 DIAGNOSIS — I10 Essential (primary) hypertension: Secondary | ICD-10-CM | POA: Diagnosis not present

## 2015-06-09 DIAGNOSIS — J986 Disorders of diaphragm: Secondary | ICD-10-CM | POA: Diagnosis not present

## 2015-06-09 DIAGNOSIS — D472 Monoclonal gammopathy: Secondary | ICD-10-CM | POA: Diagnosis not present

## 2015-06-09 DIAGNOSIS — F329 Major depressive disorder, single episode, unspecified: Secondary | ICD-10-CM | POA: Diagnosis not present

## 2015-06-10 ENCOUNTER — Encounter: Payer: Self-pay | Admitting: Nurse Practitioner

## 2015-06-10 ENCOUNTER — Encounter (INDEPENDENT_AMBULATORY_CARE_PROVIDER_SITE_OTHER): Payer: Self-pay

## 2015-06-10 ENCOUNTER — Ambulatory Visit (INDEPENDENT_AMBULATORY_CARE_PROVIDER_SITE_OTHER): Payer: Medicare Other | Admitting: Nurse Practitioner

## 2015-06-10 VITALS — BP 93/62 | HR 68 | Temp 97.4°F | Ht 67.0 in | Wt 216.8 lb

## 2015-06-10 DIAGNOSIS — Z09 Encounter for follow-up examination after completed treatment for conditions other than malignant neoplasm: Secondary | ICD-10-CM

## 2015-06-10 DIAGNOSIS — G9340 Encephalopathy, unspecified: Secondary | ICD-10-CM

## 2015-06-10 MED ORDER — LORAZEPAM 1 MG PO TABS: 1.0000 mg | ORAL_TABLET | Freq: Two times a day (BID) | ORAL | Status: DC

## 2015-06-10 NOTE — Addendum Note (Signed)
Addended by: Chevis Pretty on: 06/10/2015 03:09 PM   Modules accepted: Orders

## 2015-06-10 NOTE — Progress Notes (Signed)
   Subjective:    Patient ID: [name], female    DOB: December 21, 1950, 64 y.o.   MRN: [id number]  HPI Patient in today for hospital follow up.  She was seen here in office on 05/28/15 with mental status changes- she was sent to ER and admitted to hospital with acute encephalopathy with brain swelling. SHe was given IV steroids. SHe was discharged SUnday. Seem st o be nmuch better- More alert and able to participate in physical therapy better- No confusion.    Review of Systems  Constitutional: Negative.   HENT: Negative.   Respiratory: Negative.   Cardiovascular: Negative.   Genitourinary: Negative.   Neurological: Negative.  Negative for dizziness, light-headedness and headaches.  Psychiatric/Behavioral: Negative.   All other systems reviewed and are negative.      Objective:   Physical Exam  Constitutional: She is oriented to person, place, and time. She appears well-developed and well-nourished.  Cardiovascular: Normal rate, regular rhythm and normal heart sounds.   Pulmonary/Chest: Effort normal and breath sounds normal.  Neurological: She is alert and oriented to person, place, and time. No cranial nerve deficit.  Skin: Skin is warm and dry.  Psychiatric: She has a normal mood and affect. Her behavior is normal. Judgment and thought content normal.   BP 93/62 mmHg  Pulse 68  Temp(Src) 97.4 F (36.3 C) (Oral)  Ht 5\' 7"  (1.702 m)  Wt 216 lb 12.8 oz (98.34 kg)  BMI 33.95 kg/m2     Assessment & Plan:   1. Hospital discharge follow-up   2. Acute encephalopathy    Keep follow up appointment with neurologist Have him send me report RTO in 1 month follow up- to check kidney function  Mary-Margaret Hassell Done, FNP

## 2015-06-11 ENCOUNTER — Encounter (HOSPITAL_COMMUNITY): Payer: Self-pay | Admitting: Hematology & Oncology

## 2015-06-11 ENCOUNTER — Encounter (HOSPITAL_COMMUNITY): Payer: Medicare Other | Attending: Hematology & Oncology | Admitting: Hematology & Oncology

## 2015-06-11 VITALS — BP 101/55 | HR 68 | Temp 98.0°F | Resp 18 | Wt 218.5 lb

## 2015-06-11 DIAGNOSIS — N189 Chronic kidney disease, unspecified: Secondary | ICD-10-CM

## 2015-06-11 DIAGNOSIS — D472 Monoclonal gammopathy: Secondary | ICD-10-CM | POA: Diagnosis not present

## 2015-06-11 DIAGNOSIS — I10 Essential (primary) hypertension: Secondary | ICD-10-CM | POA: Diagnosis not present

## 2015-06-11 NOTE — Progress Notes (Signed)
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 - SEE COMMENT. PERIPHERAL BLOOD: - UNREMARKABLE. Diagnosis Note The bone marrow is overall normocellular. There is a mild plasmacytosis (approximately 5%) which is polytypic by light chain immunohistochemistry. These findings are non-specific and are not diagnostic for a plasma cell neoplasm. Vicente Males MD Pathologist, Electronic Signature (Case signed 03/03/2014) GROSS AND MICROSCOPIC INFORMATIO  CKD  CURRENT THERAPY: Observation  INTERVAL HISTORY: [name] 64 y.o. female returns for follow up of presumed MGUS. [name] has recently been hospitalized for MS changes.  [name] was diagnosed with encephalopathy and acute on chronic renal failure.   [name] notes [name] has had a significant decline in [name] health over the past year.[name] says that [name] moves around Richardson good and has the help of [name] son at home.   The patient is not current in rehab and has not had any change in medication except being prescribed a new Blood Pressure pill.  [name] states that [name] eats Richardson good but not as well as [name] should and [name] does not sleep well.  This is persistent and not new.     MEDICAL HISTORY: Past Medical History  Diagnosis Date  . Seizures   . Chronic headaches   . Essential hypertension   . Osteoporosis   . Depression   . 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 URI (upper respiratory infection)   . CKD (chronic kidney disease) stage 3, GFR 30-59 ml/min   . Restrictive lung disease   . Hyperlipidemia     has Essential hypertension; DOE (dyspnea on exertion); CHEST PAIN-UNSPECIFIED; Insomnia; Depression; Seizures; Hyperlipidemia; MGUS  (monoclonal gammopathy of unknown significance); Diaphragm paralysis; Gastroesophageal reflux disease without esophagitis; Neuropathy; Vomiting; Metabolic acidosis; Diarrhea; Acute on chronic kidney failure; Nausea vomiting and diarrhea; Protein-calorie malnutrition, severe; Acute encephalopathy; Acute renal failure superimposed on stage 3 chronic kidney disease; and Malnutrition of moderate degree on [name] problem list.     is allergic to asa; penicillins; sulfonamide derivatives; and tylenol with codeine #3.  [name] had no medications administered during this visit.   SURGICAL HISTORY: Past Surgical History  Procedure Laterality Date  . Breast reduction surgery    . Abdominal hysterectomy    . Lung lobectomy Right     Cyst  . Appendectomy    . Cholecystectomy    . Back surgery  2002    SOCIAL HISTORY: History   Social History  . Marital Status: Widowed    Spouse Name: N/A  . Number of Children: N/A  . Years of Education: N/A   Occupational History  . Unemployed    Social History Main Topics  . Smoking status: Former Smoker -- 1.00 packs/day for 30 years    Types: Cigarettes    Quit date: 11/13/2012  . Smokeless tobacco: Never Used  . Alcohol Use: No  . Drug Use: No  . Sexual Activity: Not on file   Other Topics Concern  . Not on file   Social History Narrative    FAMILY HISTORY: Family History  Problem Relation Age of Onset  . Hypertension Mother   . Heart disease Mother   . Diabetes Mother   . Kidney disease Mother   . Arthritis Mother   . Hypertension Father  Review of Systems  Constitutional: Negative for fever, chills and weight loss.  HENT: Negative for hearing loss.   Eyes: Negative for blurred vision and double vision.  Respiratory: Negative for cough.   Cardiovascular: Negative for chest pain and palpitations.  Gastrointestinal: Negative for nausea, vomiting, abdominal pain, diarrhea, constipation and blood in stool.  Genitourinary:  Negative for dysuria, urgency, frequency and hematuria.  Musculoskeletal: Positive for joint pain. Negative for myalgias and back pain.       Joint pain mostly in hips and right leg  Skin: Negative for rash.  Neurological: Negative for dizziness, tingling and headaches.  Psychiatric/Behavioral: Negative for depression.  14 point review of systems was performed and is negative except as detailed under history of present illness and above   PHYSICAL EXAMINATION  ECOG PERFORMANCE STATUS: 1 - Symptomatic but completely ambulatory  Filed Vitals:   06/11/15 1000  BP: 101/55  Pulse: 68  Temp: 98 F (36.7 C)  Resp: 18   Physical Exam  Constitutional: [name] is oriented to person, place, and time and well-developed, well-nourished, and in no distress.  HENT:  Head: Normocephalic and atraumatic.  Nose: Nose normal.  Mouth/Throat: Oropharynx is clear and moist. No oropharyngeal exudate.  Eyes: Conjunctivae and EOM are normal. Pupils are equal, round, and reactive to light. Right eye exhibits no discharge. Left eye exhibits no discharge. No scleral icterus.  Neck: Normal range of motion. Neck supple. No tracheal deviation present. No thyromegaly present.  Cardiovascular: Normal rate, regular rhythm and normal heart sounds.  Exam reveals no gallop and no friction rub.   No murmur heard. Occasional ectopy  Pulmonary/Chest: Effort normal and breath sounds normal. [name] has no wheezes. [name] has no rales.  Abdominal: Soft. Bowel sounds are normal. [name] exhibits no distension and no mass. There is no tenderness. There is no rebound and no guarding.  Musculoskeletal: Normal range of motion. [name] exhibits no edema.  Lymphadenopathy:    [name] has no cervical adenopathy.  Neurological: [name] is alert and oriented to person, place, and time. [name] has normal reflexes. No cranial nerve deficit. Gait normal. Coordination normal.  Skin: Skin is warm and dry. No rash noted.  Psychiatric: Mood, memory, affect and  judgment normal.  Nursing note and vitals reviewed.   LABORATORY DATA: Results for LARETTA, PYATT (MRN [id number]) as of 07/06/2015 19:40  Ref. Range 05/31/2015 06:39  Sodium Latest Ref Range: 135-145 mmol/L 138  Potassium Latest Ref Range: 3.5-5.1 mmol/L 4.8  Chloride Latest Ref Range: 101-111 mmol/L 108  CO2 Latest Ref Range: 22-32 mmol/L 20 (L)  BUN Latest Ref Range: 6-20 mg/dL 17  Creatinine Latest Ref Range: 0.44-1.00 mg/dL 1.37 (H)  Calcium Latest Ref Range: 8.9-10.3 mg/dL 9.0  EGFR (Non-African Amer.) Latest Ref Range: >60 mL/min 40 (L)  EGFR (African American) Latest Ref Range: >60 mL/min 46 (L)  Glucose Latest Ref Range: 65-99 mg/dL 165 (H)  Anion gap Latest Ref Range: 5-15  10  WBC Latest Ref Range: 4.0-10.5 K/uL 7.2  RBC Latest Ref Range: 3.87-5.11 MIL/uL 4.53  Hemoglobin Latest Ref Range: 12.0-15.0 g/dL 14.3  HCT Latest Ref Range: 36.0-46.0 % 44.7  MCV Latest Ref Range: 78.0-100.0 fL 98.7  MCH Latest Ref Range: 26.0-34.0 pg 31.6  MCHC Latest Ref Range: 30.0-36.0 g/dL 32.0  RDW Latest Ref Range: 11.5-15.5 % 13.5  Platelets Latest Ref Range: 150-400 K/uL 456 (H)    PATHOLOGY: Diagnosis Bone Marrow, Aspirate,Biopsy, and Clot, right iliac - NORMOCELLULAR MARROW WITH MILD POLYTYPIC PLASMACYTOSIS (5%). -  SEE COMMENT. PERIPHERAL BLOOD: - UNREMARKABLE. Diagnosis Note The bone marrow is overall normocellular. There is a mild plasmacytosis (approximately 5%) which is polytypic by light chain immunohistochemistry. These findings are non-specific and are not diagnostic for a plasma cell neoplasm. Vicente Males MD   RADIOLOGY: CLINICAL DATA: Monoclonal gammopathy  EXAM: METASTATIC BONE SURVEY  COMPARISON: Metastatic bone survey of 11/14/2012, and chest x-ray of 04/11/2015  FINDINGS: No active infiltrate or effusion is seen. There is chronic elevation of the right hemidiaphragm with right basilar atelectasis present. Mild cardiomegaly is stable.  A lateral  view the skull shows no radiolucent lesion. The cervical vertebrae remain somewhat straightened alignment with degenerative disc disease rim C3 to C6 with loss of disc space and sclerosis.  The thoracic vertebrae are in normal alignment. No compression deformity is seen.  The lumbar vertebrae remain in normal alignment with mild degenerative change diffusely. No compression deformity is seen.  Views of both shoulders and both humerus show no radiolucent lesion.  In view of the pelvis shows no radiolucent lesion. The hips are in normal position. The pelvic rami are intact.  Views of both hips and femurs show no radiolucent lesion.  Views of both tibia and fibula as well as the forearms show no radiolucent lesions.  IMPRESSION: No evidence of multiple myeloma is seen by plain film. There are diffuse degenerative changes throughout the cervical, thoracic, and lumbar spine.   Electronically Signed  By: Ivar Drape M.D.  On: 05/21/2015 15:51    ASSESSMENT and THERAPY PLAN:  IgG kappa MGUS CKD BMBX 02/2014 HTN  Originally prior to [name] visit today I thought that [name] may benefit from another bone marrow given [name] worsening kidney function. However after reviewing [name] bone marrow biopsy last year, M spike, normal CBC, normal myeloma survey I do not feel this is warranted. [name] has other issues that can certainly cause [name] kidney dysfunction, including HTN.   We will continue with observation for now. We will keep the patient on [name] 6 month office visits. Should anything change in the interim [name] can return to the clinic for additional follow-up prior to [name] next established visit.  All questions were answered. The patient knows to call the clinic with any problems, questions or concerns. We can certainly see the patient much sooner if necessary.   Follow up in 6 months  This note was electronically signed.  This document serves as a record of services personally  performed by Ancil Linsey, MD. It was created on [name] behalf by Janace Hoard, a trained medical scribe. The creation of this record is based on the scribe's personal observations and the provider's statements to them. This document has been checked and approved by the attending provider.     I have reviewed the above documentation for accuracy and completeness, and I agree with the above.  Molli Hazard, MD

## 2015-06-11 NOTE — Patient Instructions (Signed)
[place] at [facility] Discharge Instructions  RECOMMENDATIONS MADE BY THE CONSULTANT AND ANY TEST RESULTS WILL BE SENT TO YOUR REFERRING PHYSICIAN.  Exam and discussion by Dr. Whitney Muse. Keep your appointment with the neurologist and have in send [place] a note. Call with any concerns.   Labs and office visit in 6 months.  Thank you for choosing [place] at [facility] to provide your oncology and hematology care.  To afford each patient quality time with our provider, please arrive at least 15 minutes before your scheduled appointment time.    You need to re-schedule your appointment should you arrive 10 or more minutes late.  We strive to give you quality time with our providers, and arriving late affects you and other patients whose appointments are after yours.  Also, if you no show three or more times for appointments you may be dismissed from the clinic at the providers discretion.     Again, thank you for choosing [facility].  Our hope is that these requests will decrease the amount of time that you wait before being seen by our physicians.       _____________________________________________________________  Should you have questions after your visit to [facility], please contact our office at [phone] between the hours of 8:30 a.m. and 4:30 p.m.  Voicemails left after 4:30 p.m. will not be returned until the following business day.  For prescription refill requests, have your pharmacy contact our office.

## 2015-06-12 DIAGNOSIS — F329 Major depressive disorder, single episode, unspecified: Secondary | ICD-10-CM | POA: Diagnosis not present

## 2015-06-12 DIAGNOSIS — D472 Monoclonal gammopathy: Secondary | ICD-10-CM | POA: Diagnosis not present

## 2015-06-12 DIAGNOSIS — J986 Disorders of diaphragm: Secondary | ICD-10-CM | POA: Diagnosis not present

## 2015-06-12 DIAGNOSIS — I10 Essential (primary) hypertension: Secondary | ICD-10-CM | POA: Diagnosis not present

## 2015-06-16 DIAGNOSIS — I10 Essential (primary) hypertension: Secondary | ICD-10-CM | POA: Diagnosis not present

## 2015-06-16 DIAGNOSIS — F329 Major depressive disorder, single episode, unspecified: Secondary | ICD-10-CM | POA: Diagnosis not present

## 2015-06-16 DIAGNOSIS — D472 Monoclonal gammopathy: Secondary | ICD-10-CM | POA: Diagnosis not present

## 2015-06-16 DIAGNOSIS — J986 Disorders of diaphragm: Secondary | ICD-10-CM | POA: Diagnosis not present

## 2015-06-17 ENCOUNTER — Ambulatory Visit (HOSPITAL_COMMUNITY): Payer: Medicare Other | Admitting: Hematology & Oncology

## 2015-06-18 ENCOUNTER — Ambulatory Visit: Payer: Medicare Other | Admitting: Nurse Practitioner

## 2015-06-18 DIAGNOSIS — J986 Disorders of diaphragm: Secondary | ICD-10-CM | POA: Diagnosis not present

## 2015-06-18 DIAGNOSIS — F329 Major depressive disorder, single episode, unspecified: Secondary | ICD-10-CM | POA: Diagnosis not present

## 2015-06-18 DIAGNOSIS — D472 Monoclonal gammopathy: Secondary | ICD-10-CM | POA: Diagnosis not present

## 2015-06-18 DIAGNOSIS — I10 Essential (primary) hypertension: Secondary | ICD-10-CM | POA: Diagnosis not present

## 2015-06-19 DIAGNOSIS — I10 Essential (primary) hypertension: Secondary | ICD-10-CM | POA: Diagnosis not present

## 2015-06-19 DIAGNOSIS — J986 Disorders of diaphragm: Secondary | ICD-10-CM | POA: Diagnosis not present

## 2015-06-19 DIAGNOSIS — F329 Major depressive disorder, single episode, unspecified: Secondary | ICD-10-CM | POA: Diagnosis not present

## 2015-06-19 DIAGNOSIS — D472 Monoclonal gammopathy: Secondary | ICD-10-CM | POA: Diagnosis not present

## 2015-06-22 ENCOUNTER — Ambulatory Visit: Payer: Medicare Other | Admitting: Nurse Practitioner

## 2015-06-23 ENCOUNTER — Ambulatory Visit: Payer: Medicare Other | Admitting: Nurse Practitioner

## 2015-06-23 ENCOUNTER — Other Ambulatory Visit: Payer: Self-pay | Admitting: Nurse Practitioner

## 2015-06-23 ENCOUNTER — Telehealth: Payer: Self-pay | Admitting: Nurse Practitioner

## 2015-06-23 NOTE — Telephone Encounter (Signed)
Please call in ambien with 1 refills 

## 2015-06-23 NOTE — Telephone Encounter (Signed)
Last filled 05/26/15, last seen 06/10/15. Nurse call in at [place] [phone]

## 2015-06-24 ENCOUNTER — Other Ambulatory Visit: Payer: Self-pay | Admitting: Nurse Practitioner

## 2015-06-24 NOTE — Telephone Encounter (Signed)
rx called into pharmacy

## 2015-06-24 NOTE — Telephone Encounter (Signed)
Already done this am.  

## 2015-06-26 ENCOUNTER — Telehealth: Payer: Self-pay | Admitting: Nurse Practitioner

## 2015-06-29 DIAGNOSIS — I10 Essential (primary) hypertension: Secondary | ICD-10-CM | POA: Diagnosis not present

## 2015-06-29 DIAGNOSIS — D472 Monoclonal gammopathy: Secondary | ICD-10-CM | POA: Diagnosis not present

## 2015-06-29 DIAGNOSIS — J986 Disorders of diaphragm: Secondary | ICD-10-CM | POA: Diagnosis not present

## 2015-06-29 DIAGNOSIS — F329 Major depressive disorder, single episode, unspecified: Secondary | ICD-10-CM | POA: Diagnosis not present

## 2015-07-01 ENCOUNTER — Ambulatory Visit (HOSPITAL_COMMUNITY): Payer: Medicare Other | Admitting: Hematology & Oncology

## 2015-07-02 DIAGNOSIS — I10 Essential (primary) hypertension: Secondary | ICD-10-CM | POA: Diagnosis not present

## 2015-07-02 DIAGNOSIS — J986 Disorders of diaphragm: Secondary | ICD-10-CM | POA: Diagnosis not present

## 2015-07-02 DIAGNOSIS — D472 Monoclonal gammopathy: Secondary | ICD-10-CM | POA: Diagnosis not present

## 2015-07-02 DIAGNOSIS — F329 Major depressive disorder, single episode, unspecified: Secondary | ICD-10-CM | POA: Diagnosis not present

## 2015-07-08 ENCOUNTER — Other Ambulatory Visit: Payer: Self-pay | Admitting: Nurse Practitioner

## 2015-07-09 DIAGNOSIS — N183 Chronic kidney disease, stage 3 (moderate): Secondary | ICD-10-CM | POA: Diagnosis not present

## 2015-07-09 DIAGNOSIS — E559 Vitamin D deficiency, unspecified: Secondary | ICD-10-CM | POA: Diagnosis not present

## 2015-07-09 DIAGNOSIS — I10 Essential (primary) hypertension: Secondary | ICD-10-CM | POA: Diagnosis not present

## 2015-07-09 DIAGNOSIS — R809 Proteinuria, unspecified: Secondary | ICD-10-CM | POA: Diagnosis not present

## 2015-07-09 DIAGNOSIS — D519 Vitamin B12 deficiency anemia, unspecified: Secondary | ICD-10-CM | POA: Diagnosis not present

## 2015-07-09 DIAGNOSIS — Z79899 Other long term (current) drug therapy: Secondary | ICD-10-CM | POA: Diagnosis not present

## 2015-07-10 NOTE — Telephone Encounter (Signed)
Several attempts made to return phone call.  This encounter will now be closed

## 2015-07-13 ENCOUNTER — Other Ambulatory Visit: Payer: Self-pay | Admitting: Family Medicine

## 2015-07-15 DIAGNOSIS — R809 Proteinuria, unspecified: Secondary | ICD-10-CM | POA: Diagnosis not present

## 2015-07-15 DIAGNOSIS — I10 Essential (primary) hypertension: Secondary | ICD-10-CM | POA: Diagnosis not present

## 2015-07-15 DIAGNOSIS — E559 Vitamin D deficiency, unspecified: Secondary | ICD-10-CM | POA: Diagnosis not present

## 2015-07-15 DIAGNOSIS — N184 Chronic kidney disease, stage 4 (severe): Secondary | ICD-10-CM | POA: Diagnosis not present

## 2015-07-28 DIAGNOSIS — G4701 Insomnia due to medical condition: Secondary | ICD-10-CM | POA: Diagnosis not present

## 2015-07-28 DIAGNOSIS — R2681 Unsteadiness on feet: Secondary | ICD-10-CM | POA: Diagnosis not present

## 2015-07-28 DIAGNOSIS — R569 Unspecified convulsions: Secondary | ICD-10-CM | POA: Diagnosis not present

## 2015-07-28 DIAGNOSIS — I10 Essential (primary) hypertension: Secondary | ICD-10-CM | POA: Diagnosis not present

## 2015-07-29 ENCOUNTER — Encounter (INDEPENDENT_AMBULATORY_CARE_PROVIDER_SITE_OTHER): Payer: Medicare Other | Admitting: Family Medicine

## 2015-07-29 DIAGNOSIS — F329 Major depressive disorder, single episode, unspecified: Secondary | ICD-10-CM | POA: Diagnosis not present

## 2015-07-29 DIAGNOSIS — D472 Monoclonal gammopathy: Secondary | ICD-10-CM

## 2015-07-29 DIAGNOSIS — I10 Essential (primary) hypertension: Secondary | ICD-10-CM

## 2015-07-29 DIAGNOSIS — J986 Disorders of diaphragm: Secondary | ICD-10-CM | POA: Diagnosis not present

## 2015-07-30 ENCOUNTER — Institutional Professional Consult (permissible substitution): Payer: Medicare Other | Admitting: Pulmonary Disease

## 2015-08-04 ENCOUNTER — Other Ambulatory Visit: Payer: Self-pay | Admitting: Nurse Practitioner

## 2015-08-04 DIAGNOSIS — R569 Unspecified convulsions: Secondary | ICD-10-CM | POA: Diagnosis not present

## 2015-08-04 NOTE — Telephone Encounter (Signed)
Last filled 07/08/15, last seen 06/10/15. Nurse call in at [place] [phone]

## 2015-08-04 NOTE — Telephone Encounter (Signed)
Please call in ativan with 1 refills 

## 2015-08-04 NOTE — Telephone Encounter (Signed)
Medication called in to family pharmacy, patient informed

## 2015-08-06 DIAGNOSIS — R569 Unspecified convulsions: Secondary | ICD-10-CM | POA: Diagnosis not present

## 2015-08-13 ENCOUNTER — Ambulatory Visit (INDEPENDENT_AMBULATORY_CARE_PROVIDER_SITE_OTHER): Payer: Medicare Other | Admitting: Pediatrics

## 2015-08-13 ENCOUNTER — Encounter: Payer: Self-pay | Admitting: Pediatrics

## 2015-08-13 VITALS — Temp 98.1°F | Ht 67.0 in | Wt 211.0 lb

## 2015-08-13 DIAGNOSIS — Z0000 Encounter for general adult medical examination without abnormal findings: Secondary | ICD-10-CM | POA: Diagnosis not present

## 2015-08-13 DIAGNOSIS — R739 Hyperglycemia, unspecified: Secondary | ICD-10-CM | POA: Diagnosis not present

## 2015-08-13 DIAGNOSIS — Z131 Encounter for screening for diabetes mellitus: Secondary | ICD-10-CM

## 2015-08-13 DIAGNOSIS — F329 Major depressive disorder, single episode, unspecified: Secondary | ICD-10-CM | POA: Diagnosis not present

## 2015-08-13 DIAGNOSIS — F32A Depression, unspecified: Secondary | ICD-10-CM

## 2015-08-13 LAB — GLUCOSE, POCT (MANUAL RESULT ENTRY): POC Glucose: 147 mg/dl — AB (ref 70–99)

## 2015-08-13 MED ORDER — SERTRALINE HCL 50 MG PO TABS
50.0000 mg | ORAL_TABLET | Freq: Every day | ORAL | Status: DC
Start: 2015-08-13 — End: 2015-09-17

## 2015-08-13 NOTE — Progress Notes (Addendum)
Subjective:   [name] is a 64 y.o. female who presents for an Initial Medicare Annual Wellness Visit.  Review of Systems  Review of Systems  Constitutional: Positive for malaise/fatigue.  HENT: Negative for congestion.        Trouble hearing when far away   Eyes:       Cataracts on L eye, vision doing well on R side, not driving  Respiratory: Positive for shortness of breath.   Cardiovascular:       Sometimes chest pain with exertion, improved with rest  Gastrointestinal: Negative for abdominal pain, diarrhea, constipation and blood in stool.  Genitourinary: Negative for dysuria, urgency and frequency.  Musculoskeletal: Positive for falls.       Had a fall, wasn't using any assist device, R side gave out, usually uses walker or a cane  Skin: Negative for itching and rash.  Neurological: Positive for headaches.       R leg sometimes gives out, no weakness H/o TIA 2-3 yrs ago, had R sided face droop and speech changes at that time, those symptoms have not returned.  Psychiatric/Behavioral:       Depressed mood for years     Current Medications (verified) Outpatient Encounter Prescriptions as of 08/13/2015  Medication Sig  . amLODipine (NORVASC) 5 MG tablet Take 1 tablet (5 mg total) by mouth daily. (Patient taking differently: Take 5 mg by mouth at bedtime. )  . aspirin EC 81 MG tablet Take 81 mg by mouth daily.  . calcitRIOL (ROCALTROL) 0.5 MCG capsule Take 0.5 mcg by mouth daily.   . calcium acetate (PHOSLO) 667 MG capsule Take 1 capsule by mouth 3 (three) times daily.  . cholecalciferol (VITAMIN D) 1000 UNITS tablet Take 1,000 Units by mouth daily.  . COMBIVENT RESPIMAT 20-100 MCG/ACT AERS respimat INHALE 2 PUFFS INTO THE LUNGS TWICE A DAY  . DULoxetine (CYMBALTA) 60 MG capsule Take 60 mg by mouth daily.  . ferrous sulfate 325 (65 FE) MG tablet Take 325 mg by mouth daily with breakfast.  . gabapentin (NEURONTIN) 300 MG capsule Take 1 capsule (300 mg total) by mouth  at bedtime.  . hydrALAZINE (APRESOLINE) 50 MG tablet Take 1 tablet (50 mg total) by mouth 3 (three) times daily.  [name] levETIRAcetam (KEPPRA) 500 MG tablet TAKE 1 TABLET BY MOUTH TWICE DAILY  . LORazepam (ATIVAN) 1 MG tablet TAKE 1 TABLET BY MOUTH TWICE DAILY  . losartan (COZAAR) 25 MG tablet Take 1 tablet by mouth daily.  . meloxicam (MOBIC) 15 MG tablet Take 1 tablet by mouth daily.  . meloxicam (MOBIC) 15 MG tablet TAKE 1 TABLET BY MOUTH ONCE DAILY  . metoprolol (LOPRESSOR) 50 MG tablet Take 1 tablet (50 mg total) by mouth 2 (two) times daily.  . pantoprazole (PROTONIX) 40 MG tablet Take 1 tablet (40 mg total) by mouth daily.  . simvastatin (ZOCOR) 10 MG tablet TAKE 1 TABLET BY MOUTH ONCE DAILY  . zolpidem (AMBIEN) 10 MG tablet TAKE 1 TABLET BY MOUTH AT BEDTIME AS NEEDED FOR SLEEP   No facility-administered encounter medications on file as of 08/13/2015.    Allergies (verified) Asa; Penicillins; Sulfonamide derivatives; and Tylenol with codeine #3   History: Past Medical History  Diagnosis Date  . Seizures   . Chronic headaches   . Essential hypertension   . Osteoporosis   . Depression   . 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 URI (upper respiratory infection)   . CKD (chronic kidney  disease) stage 3, GFR 30-59 ml/min   . Restrictive lung disease   . Hyperlipidemia    Past Surgical History  Procedure Laterality Date  . Breast reduction surgery    . Abdominal hysterectomy    . Lung lobectomy Right     Cyst  . Appendectomy    . Cholecystectomy    . Back surgery  2002   Family History  Problem Relation Age of Onset  . Hypertension Mother   . Heart disease Mother   . Diabetes Mother   . Kidney disease Mother   . Arthritis Mother   . Hypertension Father    Social History   Occupational History  . Unemployed    Social History Main Topics  . Smoking status: Former Smoker -- 1.00 packs/day for 30 years    Types: Cigarettes    Quit date:  11/13/2012  . Smokeless tobacco: Never Used  . Alcohol Use: No  . Drug Use: No  . Sexual Activity: Not on file    Do you feel safe at home?  Yes  Dietary issues and exercise activities: Current Exercise Habits:: Home exercise routine, Type of exercise: walking  Current Dietary habits:  Eat vegetables when has appetite, or sandwich  Objective:    Today's Vitals   08/13/15 1524  Temp: 98.1 F (36.7 C)  TempSrc: Oral  Height: 5\' 7"  (1.702 m)  Weight: 211 lb (95.709 kg)   Body mass index is 33.04 kg/(m^2).  Activities of Daily Living In your present state of health, do you have any difficulty performing the following activities: 08/13/2015 05/28/2015  Hearing? Y N  Vision? Y N  Difficulty concentrating or making decisions? Y N  Walking or climbing stairs? Y Y  Dressing or bathing? Y N  Doing errands, shopping? Tempie Donning  Preparing Food and eating ? N -  Using the Toilet? N -  In the past six months, have you accidently leaked urine? N -  Do you have problems with loss of bowel control? N -  Managing your Medications? N -  Managing your Finances? N -  Housekeeping or managing your Housekeeping? Y -    Are there smokers in your home (other than you)? No      Depression Screen PHQ 2/9 Scores 08/13/2015 05/28/2015 02/26/2015 02/05/2015  PHQ - 2 Score 6 6 2 6   PHQ- 9 Score 15 23 - 18    Fall Risk Fall Risk  08/13/2015 02/26/2015 02/05/2015 09/26/2014 03/14/2014  Falls in the past year? Yes Yes Yes Yes No  Number falls in past yr: 2 or more 2 or more 1 2 or more -  Injury with Fall? No No No - -  Risk Factor Category  High Fall Risk High Fall Risk - - -  Risk for fall due to : History of fall(s);Impaired balance/gait - - History of fall(s);Impaired vision -    Cognitive Function: MMSE - Mini Mental State Exam 08/13/2015 05/28/2015  Orientation to time 5 2  Orientation to Place 5 4  Registration 3 3  Attention/ Calculation 3 5  Recall 2 2  Language- name 2 objects 2 2    Language- repeat 1 0  Language- follow 3 step command 3 3  Language- read & follow direction 1 1  Write a sentence 1 1  Copy design 1 0  Total score 27 23    Immunizations and Health Maintenance Immunization History  Administered Date(s) Administered  . Influenza,inj,Quad PF,36+ Mos 09/04/2013, 09/26/2014  . Tdap  02/05/2015   Health Maintenance Due  Topic Date Due  . Hepatitis C Screening  Apr 11, 1951  . COLONOSCOPY  08/05/2001  . ZOSTAVAX  08/06/2011  . PAP SMEAR  11/30/2012  . MAMMOGRAM  11/23/2014  . INFLUENZA VACCINE  06/29/2015    Patient Care Team: Chevis Pretty, FNP as PCP - General (Nurse Practitioner) Kathee Delton, MD as Consulting Physician (Pulmonary Disease)  Indicate any recent Medical Services you may have received from other than [facility] providers in the past year (date may be approximate).    Assessment:    Annual Wellness Visit    Screening Tests Health Maintenance  Topic Date Due  . Hepatitis C Screening  06/24/1951  . COLONOSCOPY  08/05/2001  . ZOSTAVAX  08/06/2011  . PAP SMEAR  11/30/2012  . MAMMOGRAM  11/23/2014  . INFLUENZA VACCINE  06/29/2015  . TETANUS/TDAP  02/04/2025  . HIV Screening  Completed        Plan:   During the course of the visit [name] was educated and counseled about the following appropriate screening and preventive services:   Vaccines to include Pneumoccal-- due for two at 63yo, had one two years ago, flu shot when season starts  Colorectal cancer screening--fecal occult due 10/2015  Cardiovascular disease screening--UTD on lipid screening  Diabetes screening--POCT glucose today  Mammogram--due, ordered  PAP--s/p hysterctomy  Glaucoma screening--pt going to make appt  Nutrition counseling--discussed  Smoking cessation counseling--former smoker  Advanced Directives--HCPOA is son Jeneen Rinks and [place].   Goals    None     Depression: Has been on cymbalta for several years, had been on zoloft prior  to cymbalta. Felt lik eher symptoms were better controlled on zoloft than cymbalta, woul dlike to switch back. She has sene therapists in the past in [place]. Gave list for therapists in the area, she will call to make appointment, gave her taper schedule for switching from cymbalta to zoloft. F/u in 4 weeks.  Patient Instructions (the written plan) were given to the patient.   Vanessa Maize, MD   08/13/2015    ADDENDUM: random blood sugar was after eating a sandwich, elevated at 165. Will get HgA1c at next visit. Called pt to let her know.

## 2015-08-13 NOTE — Patient Instructions (Addendum)
First week 9/16: take 60 mg of cymbalta Second week 9/23: Take 30mg  (half a tab) of cymbalta and half a tab of the sertraline (25mg ). Third week 9/30: Take 50mg  of sertraline (one whole tab) Fourth week: continue same dose sertraline  24 hour a day CRISIS NUMBER: [id number]   List of local counseling services:  [facility] Therapist [address] [place] ,[place] [address] [phone] Children limited to anxiety and depression- NO ADD/ADHD Does not accept Medicaid  [facility] [address] [place], [place] Does see children Does accept medicaid Will assess for Autism but not treat  [facility] [place]. [address] [place] [phone] Does see children  Does accept Medicaid Medication management- substance abuse- bipolar- grief- family-marriage- OCD- Anxiety- PTSD  [facility] Does see children Does accept medicaid They do perform psychological testing  [facility] [address] [place] Schedule through [place]. [phone] Patient must call and make own appointment Does se children Does accept Medicaid  [facility] [place], [place] 7-10 accompanied by an adult, 11 and up by themselves Does accept Medicaid Will see patients with- substance abuse-ADHD-ADD-Bipolar-Domestic violence-Marriage counseling- Family Counseling and sexual abuse  [facility] [address] [place] [phone] Does see children Does accept [facility] [place] [phone]  Dr. Sabra Heck-  Psychiatrist [address] [place], [place] Specializes in ADHD and addictions They do ADHD testing Suboxone  clinic  [facility] [address] [place] Does Child psychological testing  [facility] [address] Dr. Dellia [place] [phone] Does Accept Medicaid Evaluates for Autism  [facility] [place] [phone] Does Not accept Medicaid Does do adult ADD evaluations  Dr. Lorenza Evangelist [address] [place] [phone] Does not Take Medicaid Sees ADD and ADHD for treatment      [facility] [address], [place] [address] [phone] Takes Medicaid WIll see children as young as 65

## 2015-08-19 ENCOUNTER — Other Ambulatory Visit: Payer: Self-pay | Admitting: Nurse Practitioner

## 2015-08-19 NOTE — Telephone Encounter (Signed)
Refill called to [facility]

## 2015-08-19 NOTE — Telephone Encounter (Signed)
Please call in ambien with 1 refills 

## 2015-08-19 NOTE — Telephone Encounter (Signed)
Last seen 06/10/15 MMM if approved route to nurse to call into [place]   [phone]

## 2015-08-27 ENCOUNTER — Ambulatory Visit (HOSPITAL_COMMUNITY): Payer: Medicare Other

## 2015-09-03 ENCOUNTER — Ambulatory Visit (HOSPITAL_COMMUNITY): Payer: Medicare Other

## 2015-09-07 ENCOUNTER — Other Ambulatory Visit: Payer: Self-pay | Admitting: Nurse Practitioner

## 2015-09-10 DIAGNOSIS — G43709 Chronic migraine without aura, not intractable, without status migrainosus: Secondary | ICD-10-CM | POA: Diagnosis not present

## 2015-09-11 ENCOUNTER — Other Ambulatory Visit: Payer: Self-pay | Admitting: Family Medicine

## 2015-09-13 ENCOUNTER — Emergency Department (HOSPITAL_COMMUNITY): Payer: Medicare Other

## 2015-09-13 ENCOUNTER — Encounter (HOSPITAL_COMMUNITY): Payer: Self-pay | Admitting: Emergency Medicine

## 2015-09-13 ENCOUNTER — Observation Stay (HOSPITAL_COMMUNITY)
Admission: EM | Admit: 2015-09-13 | Discharge: 2015-09-16 | Disposition: A | Discharge status: Home Health | Payer: Medicare Other | Attending: Internal Medicine | Admitting: Internal Medicine

## 2015-09-13 DIAGNOSIS — Z7982 Long term (current) use of aspirin: Secondary | ICD-10-CM | POA: Diagnosis not present

## 2015-09-13 DIAGNOSIS — Z886 Allergy status to analgesic agent status: Secondary | ICD-10-CM | POA: Diagnosis not present

## 2015-09-13 DIAGNOSIS — N179 Acute kidney failure, unspecified: Secondary | ICD-10-CM

## 2015-09-13 DIAGNOSIS — R4182 Altered mental status, unspecified: Secondary | ICD-10-CM | POA: Diagnosis present

## 2015-09-13 DIAGNOSIS — Z885 Allergy status to narcotic agent status: Secondary | ICD-10-CM | POA: Insufficient documentation

## 2015-09-13 DIAGNOSIS — R410 Disorientation, unspecified: Secondary | ICD-10-CM

## 2015-09-13 DIAGNOSIS — Z79899 Other long term (current) drug therapy: Secondary | ICD-10-CM | POA: Insufficient documentation

## 2015-09-13 DIAGNOSIS — N183 Chronic kidney disease, stage 3 (moderate): Secondary | ICD-10-CM | POA: Diagnosis not present

## 2015-09-13 DIAGNOSIS — G40919 Epilepsy, unspecified, intractable, without status epilepticus: Secondary | ICD-10-CM | POA: Diagnosis not present

## 2015-09-13 DIAGNOSIS — Z9049 Acquired absence of other specified parts of digestive tract: Secondary | ICD-10-CM | POA: Diagnosis not present

## 2015-09-13 DIAGNOSIS — Z23 Encounter for immunization: Secondary | ICD-10-CM | POA: Diagnosis not present

## 2015-09-13 DIAGNOSIS — Z90710 Acquired absence of both cervix and uterus: Secondary | ICD-10-CM | POA: Insufficient documentation

## 2015-09-13 DIAGNOSIS — F32A Depression, unspecified: Secondary | ICD-10-CM | POA: Diagnosis present

## 2015-09-13 DIAGNOSIS — Z882 Allergy status to sulfonamides status: Secondary | ICD-10-CM | POA: Diagnosis not present

## 2015-09-13 DIAGNOSIS — F329 Major depressive disorder, single episode, unspecified: Secondary | ICD-10-CM | POA: Insufficient documentation

## 2015-09-13 DIAGNOSIS — D472 Monoclonal gammopathy: Secondary | ICD-10-CM | POA: Insufficient documentation

## 2015-09-13 DIAGNOSIS — G4700 Insomnia, unspecified: Secondary | ICD-10-CM | POA: Diagnosis not present

## 2015-09-13 DIAGNOSIS — I129 Hypertensive chronic kidney disease with stage 1 through stage 4 chronic kidney disease, or unspecified chronic kidney disease: Secondary | ICD-10-CM | POA: Diagnosis not present

## 2015-09-13 DIAGNOSIS — Z880 Allergy status to penicillin: Secondary | ICD-10-CM | POA: Diagnosis not present

## 2015-09-13 DIAGNOSIS — M810 Age-related osteoporosis without current pathological fracture: Secondary | ICD-10-CM | POA: Insufficient documentation

## 2015-09-13 DIAGNOSIS — G9340 Encephalopathy, unspecified: Secondary | ICD-10-CM | POA: Diagnosis not present

## 2015-09-13 DIAGNOSIS — R569 Unspecified convulsions: Secondary | ICD-10-CM

## 2015-09-13 DIAGNOSIS — E785 Hyperlipidemia, unspecified: Secondary | ICD-10-CM | POA: Insufficient documentation

## 2015-09-13 DIAGNOSIS — N189 Chronic kidney disease, unspecified: Secondary | ICD-10-CM

## 2015-09-13 HISTORY — DX: Other symptoms and signs involving the musculoskeletal system: R29.898

## 2015-09-13 HISTORY — DX: Encephalopathy, unspecified: G93.40

## 2015-09-13 LAB — COMPREHENSIVE METABOLIC PANEL
ALT: 13 U/L — ABNORMAL LOW (ref 14–54)
AST: 27 U/L (ref 15–41)
Albumin: 3.8 g/dL (ref 3.5–5.0)
Alkaline Phosphatase: 85 U/L (ref 38–126)
Anion gap: 8 (ref 5–15)
BUN: 34 mg/dL — ABNORMAL HIGH (ref 6–20)
CO2: 25 mmol/L (ref 22–32)
Calcium: 9 mg/dL (ref 8.9–10.3)
Chloride: 109 mmol/L (ref 101–111)
Creatinine, Ser: 3.42 mg/dL — ABNORMAL HIGH (ref 0.44–1.00)
GFR calc Af Amer: 15 mL/min — ABNORMAL LOW (ref >60)
GFR calc non Af Amer: 13 mL/min — ABNORMAL LOW (ref >60)
Glucose, Bld: 117 mg/dL — ABNORMAL HIGH (ref 65–99)
Potassium: 3.9 mmol/L (ref 3.5–5.1)
Sodium: 142 mmol/L (ref 135–145)
Total Bilirubin: 0.5 mg/dL (ref 0.3–1.2)
Total Protein: 7.2 g/dL (ref 6.5–8.1)

## 2015-09-13 LAB — SALICYLATE LEVEL: Salicylate Lvl: <4 mg/dL (ref 2.8–30.0)

## 2015-09-13 LAB — ACETAMINOPHEN LEVEL: Acetaminophen (Tylenol), Serum: <10 ug/mL — ABNORMAL LOW (ref 10–30)

## 2015-09-13 LAB — CBC WITH DIFFERENTIAL/PLATELET
Basophils Absolute: 0 10*3/uL (ref 0.0–0.1)
Basophils Relative: 1 %
Eosinophils Absolute: 0.1 10*3/uL (ref 0.0–0.7)
Eosinophils Relative: 1 %
HCT: 37.3 % (ref 36.0–46.0)
Hemoglobin: 11.7 g/dL — ABNORMAL LOW (ref 12.0–15.0)
Lymphocytes Relative: 10 %
Lymphs Abs: 0.8 10*3/uL (ref 0.7–4.0)
MCH: 31.9 pg (ref 26.0–34.0)
MCHC: 31.4 g/dL (ref 30.0–36.0)
MCV: 101.6 fL — ABNORMAL HIGH (ref 78.0–100.0)
Monocytes Absolute: 0.9 10*3/uL (ref 0.1–1.0)
Monocytes Relative: 10 %
Neutro Abs: 6.5 10*3/uL (ref 1.7–7.7)
Neutrophils Relative %: 78 %
Platelets: 398 10*3/uL (ref 150–400)
RBC: 3.67 MIL/uL — ABNORMAL LOW (ref 3.87–5.11)
RDW: 13.9 % (ref 11.5–15.5)
WBC: 8.3 10*3/uL (ref 4.0–10.5)

## 2015-09-13 LAB — PROTIME-INR
INR: 1.08 (ref 0.00–1.49)
Prothrombin Time: 14.2 seconds (ref 11.6–15.2)

## 2015-09-13 LAB — LACTIC ACID, PLASMA: Lactic Acid, Venous: 1.7 mmol/L (ref 0.5–2.0)

## 2015-09-13 LAB — ETHANOL: Alcohol, Ethyl (B): <5 mg/dL (ref <5)

## 2015-09-13 LAB — TROPONIN I: Troponin I: <0.03 ng/mL (ref <0.031)

## 2015-09-13 MED ORDER — SODIUM CHLORIDE 0.9 % IV SOLN
INTRAVENOUS | Status: DC
  Administered 2015-09-13: 1000 mL via INTRAVENOUS

## 2015-09-13 MED ORDER — SODIUM CHLORIDE 0.9 % IV BOLUS (SEPSIS)
250.0000 mL | Freq: Once | INTRAVENOUS | Status: AC
  Administered 2015-09-13: 250 mL via INTRAVENOUS

## 2015-09-13 NOTE — H&P (Signed)
[facility] History and Physical  [name] [id number] DOB: [birth year]    PCP:   Chevis Pretty, FNP   Chief Complaint: confusion, visual hallucination, and altered mental status.   HPI: [name] is an 64 y.o. female with multiple medical problem, staying at home with her son, having hx of Seizure on Keppra, MGUS, HTN, prior admission for limbic encephalitis, CKD, brought to the ER as she was confused, having visual hallucination, and intermittent confusion intersperse with period of perfect lucidity. Work up in the ER included elevated Cr up to 3.4, with normal Cr, and unremarkable WBC and HB.  Her head CT showed no acute process, and her MCV was noted to be elevated.  She does not drink alcohol.  It should be noted that when she was admitted last time, it was similar presentation, and it was felt that she was having multiple meds causing ADR.  She was seen in consultation with Dr Sampson Goon, and was given IV Steroids for possible autoimmune encephalitis.   Hospitalist was asked to admit her for altered mental status once again.   Rewiew of Systems: Unable.     Past Medical History  Diagnosis Date  . Seizures ([place])   . Chronic headaches   . Essential hypertension   . Osteoporosis   . Depression   . 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 URI (upper respiratory infection)   . CKD (chronic kidney disease) stage 3, GFR 30-59 ml/min   . Restrictive lung disease   . Hyperlipidemia   . Right leg weakness   . Encephalopathy     Past Surgical History  Procedure Laterality Date  . Breast reduction surgery    . Abdominal hysterectomy    . Lung lobectomy Right     Cyst  . Appendectomy    . Cholecystectomy    . Back surgery  2002    Medications:  HOME MEDS: Prior to Admission medications   Medication Sig Start Date End Date Taking? Authorizing Provider  amLODipine (NORVASC) 5 MG tablet Take 1 tablet (5 mg total) by mouth  daily. Patient taking differently: Take 5 mg by mouth at bedtime.  03/13/15 03/12/16  Mary-Margaret Hassell Done, FNP  aspirin EC 81 MG tablet Take 81 mg by mouth daily.    Historical Provider, MD  calcitRIOL (ROCALTROL) 0.5 MCG capsule Take 0.5 mcg by mouth daily.  04/24/15   Historical Provider, MD  calcium acetate (PHOSLO) 667 MG capsule Take 1 capsule by mouth 3 (three) times daily. 03/12/15   Historical Provider, MD  cholecalciferol (VITAMIN D) 1000 UNITS tablet Take 1,000 Units by mouth daily.    Historical Provider, MD  COMBIVENT RESPIMAT 20-100 MCG/ACT AERS respimat INHALE 2 PUFFS INTO THE LUNGS TWICE A DAY 07/13/15   Claretta Fraise, MD  DULoxetine (CYMBALTA) 60 MG capsule Take 60 mg by mouth daily.    Historical Provider, MD  ferrous sulfate 325 (65 FE) MG tablet Take 325 mg by mouth daily with breakfast.    Historical Provider, MD  gabapentin (NEURONTIN) 300 MG capsule Take 1 capsule (300 mg total) by mouth at bedtime. 05/31/15   Erline Hau, MD  hydrALAZINE (APRESOLINE) 50 MG tablet Take 1 tablet (50 mg total) by mouth 3 (three) times daily. 05/31/15   Erline Hau, MD  levETIRAcetam (KEPPRA) 500 MG tablet TAKE 1 TABLET BY MOUTH TWICE DAILY 07/09/15   Mary-Margaret Hassell Done, FNP  LORazepam (ATIVAN) 1 MG tablet TAKE 1 TABLET BY MOUTH TWICE  DAILY 08/04/15   Mary-Margaret Hassell Done, FNP  losartan (COZAAR) 25 MG tablet Take 1 tablet by mouth daily. 06/25/15   Mary-Margaret Hassell Done, FNP  meloxicam (MOBIC) 15 MG tablet Take 1 tablet by mouth daily. 05/26/15   Historical Provider, MD  meloxicam (MOBIC) 15 MG tablet TAKE 1 TABLET BY MOUTH ONCE DAILY 09/08/15   Mary-Margaret Hassell Done, FNP  metoprolol (LOPRESSOR) 50 MG tablet Take 1 tablet (50 mg total) by mouth 2 (two) times daily. 03/13/15   Mary-Margaret Hassell Done, FNP  pantoprazole (PROTONIX) 40 MG tablet Take 1 tablet (40 mg total) by mouth daily. 03/13/15   Mary-Margaret Hassell Done, FNP  sertraline (ZOLOFT) 50 MG tablet Take 1 tablet (50 mg total) by  mouth daily. 08/13/15   Eustaquio Maize, MD  simvastatin (ZOCOR) 10 MG tablet TAKE 1 TABLET BY MOUTH IN THE EVENING FOR CHOLESTEROL 09/11/15   Chipper Herb, MD  zolpidem (AMBIEN) 10 MG tablet TAKE 1 TABLET BY MOUTH AT BEDTIME 08/19/15   Mary-Margaret Hassell Done, FNP     Allergies:  Allergies  Allergen Reactions  . Asa [Aspirin]     High dose Aspirin causes itching but can take 81mg    . Penicillins Hives  . Sulfonamide Derivatives Hives  . Tylenol With Codeine #3 [Acetaminophen-Codeine] Itching    Social History:   reports that she quit smoking about 2 years ago. Her smoking use included Cigarettes. She has a 30 pack-year smoking history. She has never used smokeless tobacco. She reports that she does not drink alcohol or use illicit drugs.  Family History: Family History  Problem Relation Age of Onset  . Hypertension Mother   . Heart disease Mother   . Diabetes Mother   . Kidney disease Mother   . Arthritis Mother   . Hypertension Father      Physical Exam: Filed Vitals:   09/13/15 2130 09/13/15 2200 09/13/15 2230 09/13/15 2300  BP: 123/57 102/59 141/61 127/67  Pulse: 89 87 96 93  Temp:      TempSrc:      Resp: 11 12 12 10   Height:      Weight:      SpO2: 100% 99% 100% 100%   Blood pressure 127/67, pulse 93, temperature 98.2 F (36.8 C), temperature source Oral, resp. rate 10, height 5\' 7"  (1.702 m), weight 98.431 kg (217 lb), SpO2 100 %.  GEN:  Pleasant  patient lying in the stretcher in no acute distress; cooperative with exam. But is confused, and intermittent sleepiness  PSYCH:  alert and oriented x4; does not appear anxious or depressed; affect is appropriate. HEENT: Mucous membranes pink and anicteric; PERRLA; EOM intact; no cervical lymphadenopathy nor thyromegaly or carotid bruit; no JVD; There were no stridor. Neck is very supple. Breasts:: Not examined CHEST WALL: No tenderness CHEST: Normal respiration, clear to auscultation bilaterally.  HEART: Regular rate  and rhythm.  There are no murmur, rub, or gallops.   BACK: No kyphosis or scoliosis; no CVA tenderness ABDOMEN: soft and non-tender; no masses, no organomegaly, normal abdominal bowel sounds; no pannus; no intertriginous candida. There is no rebound and no distention. Rectal Exam: Not done EXTREMITIES: No bone or joint deformity; age-appropriate arthropathy of the hands and knees; no edema; no ulcerations.  There is no calf tenderness. Genitalia: not examined PULSES: 2+ and symmetric SKIN: Normal hydration no rash or ulceration CNS: Cranial nerves 2-12 grossly intact no focal lateralizing neurologic deficit.  Speech is fluent; uvula elevated with phonation, facial symmetry and tongue midline. DTR are normal  bilaterally, cerebella exam is intact, barbinski is negative and strengths are equaled bilaterally.  No sensory loss.   Labs on Admission:  Basic Metabolic Panel:  Recent Labs Lab 09/13/15 2102  NA 142  K 3.9  CL 109  CO2 25  GLUCOSE 117*  BUN 34*  CREATININE 3.42*  CALCIUM 9.0   Liver Function Tests:  Recent Labs Lab 09/13/15 2102  AST 27  ALT 13*  ALKPHOS 85  BILITOT 0.5  PROT 7.2  ALBUMIN 3.8   No results for input(s): LIPASE, AMYLASE in the last 168 hours. No results for input(s): AMMONIA in the last 168 hours. CBC:  Recent Labs Lab 09/13/15 2102  WBC 8.3  NEUTROABS 6.5  HGB 11.7*  HCT 37.3  MCV 101.6*  PLT 398   Cardiac Enzymes:  Recent Labs Lab 09/13/15 2102  TROPONINI <0.03    Radiological Exams on Admission: Dg Chest 1 View  09/13/2015  CLINICAL DATA:  Altered mental status. Confused today. History of hypertension and seizures and kidney disease. EXAM: CHEST 1 VIEW COMPARISON:  05/28/2015. FINDINGS: Lung volumes are low. There is crowding of the bronchovascular structures, particularly at the right base due to elevation right hemidiaphragm. There is no convincing pneumonia or edema. Cardiac silhouette is normal in size. No mediastinal or hilar  masses. No pleural effusion or pneumothorax. Bony thorax is intact. IMPRESSION: Somewhat limited study due to low lung volumes and an elevated right hemidiaphragm. Allowing for this, there is no evidence of acute cardiopulmonary disease. Electronically Signed   By: Lajean Manes M.D.   On: 09/13/2015 21:53   Ct Head Wo Contrast  09/13/2015  CLINICAL DATA:  Pt here for Altered Mental status. Per family, pt been looking for "chicken livers under her bed". Pt has been having periods of confusion. Pt has been receiving Botox injections (has had approx. 31 injections) for headaches. Sta.*comment was truncated* EXAM: CT HEAD WITHOUT CONTRAST TECHNIQUE: Contiguous axial images were obtained from the base of the skull through the vertex without intravenous contrast. COMPARISON:  02/14/2013 FINDINGS: The ventricles are normal in size and configuration. There are no parenchymal masses or mass effect. There are subtle areas of white matter hypoattenuation consistent with chronic microvascular ischemic change. There is no evidence of a cortical infarct. There are no extra-axial masses or abnormal fluid collections. There is no intracranial hemorrhage. Visualized sinuses and mastoid air cells are clear. No skull lesion. IMPRESSION: 1. No acute intracranial abnormalities. 2. Minor chronic microvascular ischemic change. Stable appearance from the prior study. Electronically Signed   By: Lajean Manes M.D.   On: 09/13/2015 21:48   Assessment/Plan Present on Admission:  . Delirium . Acute on chronic kidney failure ([place]) . Depression . MGUS (monoclonal gammopathy of unknown significance)  PLAN:  Her presentation is that of delirium.  I am not sure what the exact etiology but suspect drug  (medications)  induced delirium.  She is also found to have AKI, in the setting of Mobic and ACE I use.  I cannot exclude psychiatric disorder, but less likely.  Will admit her into the hospital, obtain B12, Thiamine, HIV, and repeat  MRI of her brain.   I would like to consult Dr Merlene Laughter, who had seen her before.  The last visit reportedly was about a month ago.  Medications that may cause similar side effects include Cymbalta, SSRI, and thus will be discontinued.  For her AKI, will d/c Mobic, Losartan, and give IVF.  If Cr doesn't improve, please obtain renal  US.  She is stable, full code, and will be admitted to [facility] service.   I have updated her son and her daughter who are at her bedside tonight.   Thank you so much for allowing me to partake in her care.   Other plans as per orders.  Code Status: FULL Haskel Khan, MD. [facility] Pager [phone] 7pm to 7am.  09/13/2015, 11:55 PM

## 2015-09-13 NOTE — ED Notes (Signed)
Pt here for Altered Mental status. Per family, pt been looking for "chicken livers under her bed". Pt has been having periods of confusion. Pt has been receiving Botox injections (has had approx. 31 injections) for headaches. States that they found swelling on pt's brain the last time she was here.

## 2015-09-13 NOTE — ED Provider Notes (Signed)
CSN: [id number]     Arrival date & time 09/13/15  1954 History   First MD Initiated Contact with Patient 09/13/15 2112     Chief Complaint  Patient presents with  . Altered Mental Status      Patient is a 64 y.o. female presenting with altered mental status. The history is provided by a relative and the patient. The history is limited by the condition of the patient (AMS).  Altered Mental Status Pt was seen at 2145.  Per pt's family: Son and Granddaughter state pt "was fine" last night. Pt woke up this morning with AMS. Pt was "falling asleep" during breakfast, and was found later on in the day "looking for chicken livers under her bed." Pt's family states pt has had similar symptoms previously and was dx with "encephalopathy." Denies vomiting/diarrhea, no cough/SOB, no fevers, no falls, no syncope, no new focal motor weakness from baseline.   Past Medical History  Diagnosis Date  . Seizures ([place])   . Chronic headaches   . Essential hypertension   . Osteoporosis   . Depression   . 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 URI (upper respiratory infection)   . CKD (chronic kidney disease) stage 3, GFR 30-59 ml/min   . Restrictive lung disease   . Hyperlipidemia   . Right leg weakness   . Encephalopathy    Past Surgical History  Procedure Laterality Date  . Breast reduction surgery    . Abdominal hysterectomy    . Lung lobectomy Right     Cyst  . Appendectomy    . Cholecystectomy    . Back surgery  2002   Family History  Problem Relation Age of Onset  . Hypertension Mother   . Heart disease Mother   . Diabetes Mother   . Kidney disease Mother   . Arthritis Mother   . Hypertension Father    Social History  Substance Use Topics  . Smoking status: Former Smoker -- 1.00 packs/day for 30 years    Types: Cigarettes    Quit date: 11/13/2012  . Smokeless tobacco: Never Used  . Alcohol Use: No    Review of Systems  Unable to perform ROS: Mental  status change      Allergies  Asa; Penicillins; Sulfonamide derivatives; and Tylenol with codeine #3  Home Medications   Prior to Admission medications   Medication Sig Start Date End Date Taking? Authorizing Provider  amLODipine (NORVASC) 5 MG tablet Take 1 tablet (5 mg total) by mouth daily. Patient taking differently: Take 5 mg by mouth at bedtime.  03/13/15 03/12/16  Mary-Margaret Hassell Done, FNP  aspirin EC 81 MG tablet Take 81 mg by mouth daily.    Historical Provider, MD  calcitRIOL (ROCALTROL) 0.5 MCG capsule Take 0.5 mcg by mouth daily.  04/24/15   Historical Provider, MD  calcium acetate (PHOSLO) 667 MG capsule Take 1 capsule by mouth 3 (three) times daily. 03/12/15   Historical Provider, MD  cholecalciferol (VITAMIN D) 1000 UNITS tablet Take 1,000 Units by mouth daily.    Historical Provider, MD  COMBIVENT RESPIMAT 20-100 MCG/ACT AERS respimat INHALE 2 PUFFS INTO THE LUNGS TWICE A DAY 07/13/15   Claretta Fraise, MD  DULoxetine (CYMBALTA) 60 MG capsule Take 60 mg by mouth daily.    Historical Provider, MD  ferrous sulfate 325 (65 FE) MG tablet Take 325 mg by mouth daily with breakfast.    Historical Provider, MD  gabapentin (NEURONTIN) 300 MG capsule Take 1 capsule (  300 mg total) by mouth at bedtime. 05/31/15   Erline Hau, MD  hydrALAZINE (APRESOLINE) 50 MG tablet Take 1 tablet (50 mg total) by mouth 3 (three) times daily. 05/31/15   Erline Hau, MD  levETIRAcetam (KEPPRA) 500 MG tablet TAKE 1 TABLET BY MOUTH TWICE DAILY 07/09/15   Mary-Margaret Hassell Done, FNP  LORazepam (ATIVAN) 1 MG tablet TAKE 1 TABLET BY MOUTH TWICE DAILY 08/04/15   Mary-Margaret Hassell Done, FNP  losartan (COZAAR) 25 MG tablet Take 1 tablet by mouth daily. 06/25/15   Mary-Margaret Hassell Done, FNP  meloxicam (MOBIC) 15 MG tablet Take 1 tablet by mouth daily. 05/26/15   Historical Provider, MD  meloxicam (MOBIC) 15 MG tablet TAKE 1 TABLET BY MOUTH ONCE DAILY 09/08/15   Mary-Margaret Hassell Done, FNP  metoprolol  (LOPRESSOR) 50 MG tablet Take 1 tablet (50 mg total) by mouth 2 (two) times daily. 03/13/15   Mary-Margaret Hassell Done, FNP  pantoprazole (PROTONIX) 40 MG tablet Take 1 tablet (40 mg total) by mouth daily. 03/13/15   Mary-Margaret Hassell Done, FNP  sertraline (ZOLOFT) 50 MG tablet Take 1 tablet (50 mg total) by mouth daily. 08/13/15   Eustaquio Maize, MD  simvastatin (ZOCOR) 10 MG tablet TAKE 1 TABLET BY MOUTH IN THE EVENING FOR CHOLESTEROL 09/11/15   Chipper Herb, MD  zolpidem (AMBIEN) 10 MG tablet TAKE 1 TABLET BY MOUTH AT BEDTIME 08/19/15   Mary-Margaret Hassell Done, FNP   BP 123/57 mmHg  Pulse 89  Temp(Src) 98.2 F (36.8 C) (Oral)  Resp 11  Ht 5\' 7"  (1.702 m)  Wt 217 lb (98.431 kg)  BMI 33.98 kg/m2  SpO2 100% Physical Exam  2150: Physical examination:  Nursing notes reviewed; Vital signs and O2 SAT reviewed;  Constitutional: Well developed, Well nourished, In no acute distress; Head:  Normocephalic, atraumatic; Eyes: EOMI, PERRL, No scleral icterus; ENMT: Mouth and pharynx normal, Mucous membranes dry; Neck: Supple, Full range of motion, No lymphadenopathy; Cardiovascular: Regular rate and rhythm, No gallop; Respiratory: Breath sounds clear & equal bilaterally, No wheezes.  Speaking full sentences with ease, Normal respiratory effort/excursion; Chest: Nontender, Movement normal; Abdomen: Soft, Nontender, Nondistended, Normal bowel sounds; Genitourinary: No CVA tenderness; Extremities: Pulses normal, No tenderness, No edema, No calf edema or asymmetry.; Neuro: Sleeping on my arrival to exam room, but easily awakened to her name and light touch. Confused re: people, time, events. No facial droop. Major CN grossly intact.  Speech clear. Grips equal. +RLE weakness per hx. Moves bilat UE's and LLE spontaneously and to command without apparent gross focal motor deficits.; Skin: Color normal, Warm, Dry.   ED Course  Procedures (including critical care time) Labs Review   Imaging Review  I have personally  reviewed and evaluated these images and lab results as part of my medical decision-making.   EKG Interpretation   Date/Time:  Sunday September 13 2015 20:55:30 EDT Ventricular Rate:  99 PR Interval:  178 QRS Duration: 77 QT Interval:  354 QTC Calculation: [id number] R Axis:   93 Text Interpretation:  Sinus rhythm Borderline right axis deviation  Artifact When compared with ECG of 05/29/2015 Rate faster Confirmed by  [facility]  MD, Nunzio Cory [phone]) on 09/13/2015 9:57:04 PM      MDM  MDM Reviewed: previous chart, nursing note and vitals Reviewed previous: labs and ECG Interpretation: labs, ECG, x-ray and CT scan      Results for orders placed or performed during the hospital encounter of 09/13/15  CBC with Differential  Result Value Ref Range  WBC 8.3 4.0 - 10.5 K/uL   RBC 3.67 (L) 3.87 - 5.11 MIL/uL   Hemoglobin 11.7 (L) 12.0 - 15.0 g/dL   HCT 37.3 36.0 - 46.0 %   MCV 101.6 (H) 78.0 - 100.0 fL   MCH 31.9 26.0 - 34.0 pg   MCHC 31.4 30.0 - 36.0 g/dL   RDW 13.9 11.5 - 15.5 %   Platelets 398 150 - 400 K/uL   Neutrophils Relative % 78 %   Neutro Abs 6.5 1.7 - 7.7 K/uL   Lymphocytes Relative 10 %   Lymphs Abs 0.8 0.7 - 4.0 K/uL   Monocytes Relative 10 %   Monocytes Absolute 0.9 0.1 - 1.0 K/uL   Eosinophils Relative 1 %   Eosinophils Absolute 0.1 0.0 - 0.7 K/uL   Basophils Relative 1 %   Basophils Absolute 0.0 0.0 - 0.1 K/uL  Comprehensive metabolic panel  Result Value Ref Range   Sodium 142 135 - 145 mmol/L   Potassium 3.9 3.5 - 5.1 mmol/L   Chloride 109 101 - 111 mmol/L   CO2 25 22 - 32 mmol/L   Glucose, Bld 117 (H) 65 - 99 mg/dL   BUN 34 (H) 6 - 20 mg/dL   Creatinine, Ser 3.42 (H) 0.44 - 1.00 mg/dL   Calcium 9.0 8.9 - 10.3 mg/dL   Total Protein 7.2 6.5 - 8.1 g/dL   Albumin 3.8 3.5 - 5.0 g/dL   AST 27 15 - 41 U/L   ALT 13 (L) 14 - 54 U/L   Alkaline Phosphatase 85 38 - 126 U/L   Total Bilirubin 0.5 0.3 - 1.2 mg/dL   GFR calc non Af Amer 13 (L) >60 mL/min   GFR calc Af  Amer 15 (L) >60 mL/min   Anion gap 8 5 - 15  Troponin I  Result Value Ref Range   Troponin I <0.03 <0.031 ng/mL  Lactic acid, plasma  Result Value Ref Range   Lactic Acid, Venous 1.7 0.5 - 2.0 mmol/L  Ethanol  Result Value Ref Range   Alcohol, Ethyl (B) <5 <5 mg/dL  Acetaminophen level  Result Value Ref Range   Acetaminophen (Tylenol), Serum <10 (L) 10 - 30 ug/mL  Salicylate level  Result Value Ref Range   Salicylate Lvl 123456 2.8 - 30.0 mg/dL  Protime-INR  Result Value Ref Range   Prothrombin Time 14.2 11.6 - 15.2 seconds   INR 1.08 0.00 - 1.49   Dg Chest 1 View 09/13/2015  CLINICAL DATA:  Altered mental status. Confused today. History of hypertension and seizures and kidney disease. EXAM: CHEST 1 VIEW COMPARISON:  05/28/2015. FINDINGS: Lung volumes are low. There is crowding of the bronchovascular structures, particularly at the right base due to elevation right hemidiaphragm. There is no convincing pneumonia or edema. Cardiac silhouette is normal in size. No mediastinal or hilar masses. No pleural effusion or pneumothorax. Bony thorax is intact. IMPRESSION: Somewhat limited study due to low lung volumes and an elevated right hemidiaphragm. Allowing for this, there is no evidence of acute cardiopulmonary disease. Electronically Signed   By: Lajean Manes M.D.   On: 09/13/2015 21:53   Ct Head Wo Contrast 09/13/2015  CLINICAL DATA:  Pt here for Altered Mental status. Per family, pt been looking for "chicken livers under her bed". Pt has been having periods of confusion. Pt has been receiving Botox injections (has had approx. 31 injections) for headaches. Sta.*comment was truncated* EXAM: CT HEAD WITHOUT CONTRAST TECHNIQUE: Contiguous axial images were obtained from the  base of the skull through the vertex without intravenous contrast. COMPARISON:  02/14/2013 FINDINGS: The ventricles are normal in size and configuration. There are no parenchymal masses or mass effect. There are subtle areas  of white matter hypoattenuation consistent with chronic microvascular ischemic change. There is no evidence of a cortical infarct. There are no extra-axial masses or abnormal fluid collections. There is no intracranial hemorrhage. Visualized sinuses and mastoid air cells are clear. No skull lesion. IMPRESSION: 1. No acute intracranial abnormalities. 2. Minor chronic microvascular ischemic change. Stable appearance from the prior study. Electronically Signed   By: Lajean Manes M.D.   On: 09/13/2015 21:48    Results for ELANORA, RAMSOUR (MRN [id number]) as of 09/13/2015 23:07  Ref. Range 05/30/2015 07:05 05/31/2015 06:39 09/13/2015 21:02  BUN Latest Ref Range: 6-20 mg/dL 23 (H) 17 34 (H)  Creatinine Latest Ref Range: 0.44-1.00 mg/dL 1.51 (H) 1.37 (H) 3.42 (H)    2240:  New ARF on labs; judicious IVF given. In and out cath performed for urine sample without urine return; will need IV hydration before further attempts. Pt remains afebrile with stable VS. Dx and testing d/w pt and family.  Questions answered.  Verb understanding, agreeable to admit.   T/C to [facility]. [place] Comment, case discussed, including:  HPI, pertinent PM/SHx, VS/PE, dx testing, ED course and treatment:  Agreeable to admit, requests he will come to the ED for evaluation.   Francine Graven, DO 09/16/15 2227

## 2015-09-14 ENCOUNTER — Inpatient Hospital Stay (HOSPITAL_COMMUNITY): Payer: Medicare Other

## 2015-09-14 DIAGNOSIS — R569 Unspecified convulsions: Secondary | ICD-10-CM | POA: Diagnosis not present

## 2015-09-14 DIAGNOSIS — G9340 Encephalopathy, unspecified: Secondary | ICD-10-CM | POA: Diagnosis not present

## 2015-09-14 DIAGNOSIS — N179 Acute kidney failure, unspecified: Secondary | ICD-10-CM | POA: Diagnosis not present

## 2015-09-14 DIAGNOSIS — R410 Disorientation, unspecified: Secondary | ICD-10-CM | POA: Diagnosis not present

## 2015-09-14 LAB — URINALYSIS, ROUTINE W REFLEX MICROSCOPIC
Glucose, UA: NEGATIVE mg/dL
Hgb urine dipstick: NEGATIVE
Leukocytes, UA: NEGATIVE
Nitrite: NEGATIVE
Specific Gravity, Urine: >1.03 — ABNORMAL HIGH (ref 1.005–1.030)
Urobilinogen, UA: 0.2 mg/dL (ref 0.0–1.0)
pH: 5.5 (ref 5.0–8.0)

## 2015-09-14 LAB — BASIC METABOLIC PANEL
Anion gap: 11 (ref 5–15)
BUN: 36 mg/dL — ABNORMAL HIGH (ref 6–20)
CO2: 24 mmol/L (ref 22–32)
Calcium: 9.4 mg/dL (ref 8.9–10.3)
Chloride: 108 mmol/L (ref 101–111)
Creatinine, Ser: 3.47 mg/dL — ABNORMAL HIGH (ref 0.44–1.00)
GFR calc Af Amer: 15 mL/min — ABNORMAL LOW (ref >60)
GFR calc non Af Amer: 13 mL/min — ABNORMAL LOW (ref >60)
Glucose, Bld: 114 mg/dL — ABNORMAL HIGH (ref 65–99)
Potassium: 4.2 mmol/L (ref 3.5–5.1)
Sodium: 143 mmol/L (ref 135–145)

## 2015-09-14 LAB — URINE MICROSCOPIC-ADD ON

## 2015-09-14 LAB — RAPID URINE DRUG SCREEN, HOSP PERFORMED
Amphetamines: NOT DETECTED
Barbiturates: NOT DETECTED
Benzodiazepines: POSITIVE — AB
Cocaine: NOT DETECTED
Opiates: NOT DETECTED
Tetrahydrocannabinol: NOT DETECTED

## 2015-09-14 LAB — VITAMIN B12: Vitamin B-12: 566 pg/mL (ref 180–914)

## 2015-09-14 LAB — TSH: TSH: 0.393 u[IU]/mL (ref 0.350–4.500)

## 2015-09-14 MED ORDER — PANTOPRAZOLE SODIUM 40 MG PO TBEC
40.0000 mg | DELAYED_RELEASE_TABLET | Freq: Every day | ORAL | Status: DC
  Administered 2015-09-14 – 2015-09-16 (×3): 40 mg via ORAL
  Filled 2015-09-14 (×3): qty 1

## 2015-09-14 MED ORDER — PNEUMOCOCCAL VAC POLYVALENT 25 MCG/0.5ML IJ INJ
0.5000 mL | INJECTION | INTRAMUSCULAR | Status: DC
  Filled 2015-09-14: qty 0.5

## 2015-09-14 MED ORDER — METOPROLOL TARTRATE 50 MG PO TABS
50.0000 mg | ORAL_TABLET | Freq: Two times a day (BID) | ORAL | Status: DC
  Administered 2015-09-14 – 2015-09-16 (×6): 50 mg via ORAL
  Filled 2015-09-14 (×6): qty 1

## 2015-09-14 MED ORDER — DEXTROSE-NACL 5-0.9 % IV SOLN
INTRAVENOUS | Status: DC
  Administered 2015-09-14 – 2015-09-16 (×4): via INTRAVENOUS

## 2015-09-14 MED ORDER — LEVETIRACETAM 500 MG PO TABS
500.0000 mg | ORAL_TABLET | Freq: Two times a day (BID) | ORAL | Status: DC
  Administered 2015-09-14 – 2015-09-16 (×6): 500 mg via ORAL
  Filled 2015-09-14 (×6): qty 1

## 2015-09-14 MED ORDER — AMLODIPINE BESYLATE 5 MG PO TABS
5.0000 mg | ORAL_TABLET | Freq: Every day | ORAL | Status: DC
  Administered 2015-09-14 – 2015-09-16 (×3): 5 mg via ORAL
  Filled 2015-09-14 (×3): qty 1

## 2015-09-14 MED ORDER — SIMVASTATIN 10 MG PO TABS
10.0000 mg | ORAL_TABLET | Freq: Every day | ORAL | Status: DC
  Administered 2015-09-14 – 2015-09-15 (×2): 10 mg via ORAL
  Filled 2015-09-14 (×2): qty 1

## 2015-09-14 MED ORDER — CALCIUM ACETATE (PHOS BINDER) 667 MG PO CAPS
667.0000 mg | ORAL_CAPSULE | Freq: Three times a day (TID) | ORAL | Status: DC
  Administered 2015-09-14 – 2015-09-16 (×5): 667 mg via ORAL
  Filled 2015-09-14 (×6): qty 1

## 2015-09-14 MED ORDER — ASPIRIN EC 81 MG PO TBEC
81.0000 mg | DELAYED_RELEASE_TABLET | Freq: Every day | ORAL | Status: DC
  Administered 2015-09-14 – 2015-09-16 (×3): 81 mg via ORAL
  Filled 2015-09-14 (×3): qty 1

## 2015-09-14 MED ORDER — IPRATROPIUM-ALBUTEROL 0.5-2.5 (3) MG/3ML IN SOLN
3.0000 mL | Freq: Four times a day (QID) | RESPIRATORY_TRACT | Status: DC
  Administered 2015-09-14 (×3): 3 mL via RESPIRATORY_TRACT
  Filled 2015-09-14 (×3): qty 3

## 2015-09-14 MED ORDER — INFLUENZA VAC SPLIT QUAD 0.5 ML IM SUSY
0.5000 mL | PREFILLED_SYRINGE | INTRAMUSCULAR | Status: AC
  Administered 2015-09-15: 0.5 mL via INTRAMUSCULAR

## 2015-09-14 MED ORDER — HEPARIN SODIUM (PORCINE) 5000 UNIT/ML IJ SOLN
5000.0000 [IU] | Freq: Three times a day (TID) | INTRAMUSCULAR | Status: DC
  Administered 2015-09-14 – 2015-09-16 (×8): 5000 [IU] via SUBCUTANEOUS
  Filled 2015-09-14 (×7): qty 1

## 2015-09-14 NOTE — Progress Notes (Signed)
IV to rightr hand infiltrated. Attempt made by 2 nurses, but unable to obtain IV access. Nursing supervisor called.

## 2015-09-14 NOTE — Progress Notes (Addendum)
PROGRESS NOTE  [name] [id number] DOB: 06/02/1951 DOA: 09/13/2015 PCP: Chevis Pretty, FNP  Summary: 61 yow PMH seizure, MGUS, HTN, prior admission for limbic encephalitis, CKD, presented with complaints of confusion, visual hallucination, and intermittent confusion intersperse with period of perfect lucidity. Hospitalist was asked to admit her for acute encephalopathy, AKI.  Assessment/Plan: Acute encephalopathy with visual hallucinations. Suspect medication induced. Afebrile, no sign of infection. Will consult neurology. Cymbalta and SSRI on hold. CT head unremarkable. MRI brain revealed no acute abnormalities. AKI superimposed on CKD Stage III. Etiology unclear.  Mobic and ACE-I are being withheld in the setting of kidney injury. Renal u/s and u/a unremarkable. Depression. Will monitor off antidepressants. MGUS. Seizure disorder. Exam does not suggest active seizures. Will continue Keppra.  HLD. Continue statin. Essential HTN. Stable. GERD. Stable.  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    Ongoing acute encephalopathy with unclear etiology. MRI brain unrevealing, exam nonfocal.  Continue supportive care    Follow up neurology consult.  BMP in the morning, place Foley and continue IVF. Will consult nephrology for further recommendations.  Will check an EEG  Code Status: FULL DVT prophylaxis: Heparin  Family Communication: No family bedside.   Disposition Plan: Home when improved  Murray Hodgkins, MD  [facility]  Pager [phone] If 7PM-7AM, please contact night-coverage at [email or web address], password [facility] 09/14/2015, 6:48 AM  LOS: 1 day   Consultants:    Procedures:    Antibiotics:    HPI/Subjective: Feels ok. Does complain of headaches in occipital region that is chronic but worsening. Mild nausea and one episode of vomiting. Was able to eat today.  Denies SOB, and chest  pain. Admits to frequent falls and uses a cane at baseline to ambulate.   Objective: Filed Vitals:   09/13/15 2230 09/13/15 2300 09/14/15 0117 09/14/15 0552  BP: 141/61 127/67 155/70 123/60  Pulse: 96 93 101 67  Temp:   98.9 F (37.2 C) 98.4 F (36.9 C)  TempSrc:   Oral Oral  Resp: 12 10 18 18   Height:      Weight:   93.668 kg (206 lb 8 oz)   SpO2: 100% 100% 96% 98%    Intake/Output Summary (Last 24 hours) at 09/14/15 0648 Last data filed at 09/14/15 0615  Gross per 24 hour  Intake    250 ml  Output    215 ml  Net     35 ml     Filed Weights   09/13/15 2009 09/14/15 0117  Weight: 98.431 kg (217 lb) 93.668 kg (206 lb 8 oz)    Exam:  Afebrile, VSS, not hypoxic  General:  Appears calm and comfortable Eyes: PERRL, normal lids, irises & conjunctiva ENT: grossly normal hearing, lips & tongue Cardiovascular: 2/6 systolic murmur LUSB, RRR, no r/g. No LE edema. Respiratory: CTA bilaterally, no w/r/r. Normal respiratory effort. Abdomen: soft, ntnd Skin: Some bruising over the left knee.  Musculoskeletal: Generally weak but moves all extremities to command. Intentional tremors noted with giveaway strength. Moves left arm spontaneously much better than comman Psychiatric: mood appears normal, affect odd. Oriented to self, month, place, president, and reason for hospitalization but appears confused and sometimes repeats self. Neurologic: grossly non-focal. CN intact.   New data reviewed:  BUN 36, Creatinine 3.47, without significant change.  UOP likely not fully recorded  US renal without acute features  MRI brain unremarkable.   Glucose 114  Vitamin B12 566  Pertinent data  since admission:  CT Head and CXR revealed no acute disease.   EtOH <5  EKG SR  Pending data:    Scheduled Meds: . amLODipine  5 mg Oral Daily  . aspirin EC  81 mg Oral Daily  . calcium acetate  667 mg Oral TID WC  . heparin  5,000 Units Subcutaneous 3 times per day  . [START ON  09/15/2015] Influenza vac split quadrivalent PF  0.5 mL Intramuscular Tomorrow-1000  . ipratropium-albuterol  3 mL Inhalation QID  . levETIRAcetam  500 mg Oral BID  . metoprolol  50 mg Oral BID  . pantoprazole  40 mg Oral Daily  . [START ON 09/15/2015] pneumococcal 23 valent vaccine  0.5 mL Intramuscular Tomorrow-1000  . simvastatin  10 mg Oral q1800   Continuous Infusions: . sodium chloride 1,000 mL (09/13/15 2234)  . dextrose 5 % and 0.9% NaCl 125 mL/hr at 09/14/15 0059    Principal Problem:   Acute encephalopathy Active Problems:   Depression   Seizures (HCC)   MGUS (monoclonal gammopathy of unknown significance)   AKI (acute kidney injury) ([place])   Delirium   Time spent 25 minutes   By signing my name below, I, Rennis Harding attest that this documentation has been prepared under the direction and in the presence of Murray Hodgkins, MD Electronically signed: Rennis Harding  09/14/2015 1:41 PM   I personally performed the services described in this documentation. All medical record entries made by the scribe were at my direction. I have reviewed the chart and agree that the record reflects my personal performance and is accurate and complete. Murray Hodgkins, MD

## 2015-09-14 NOTE — Progress Notes (Signed)
EEG Tech arrived to patients room as she was being taken out to go to ultrasound. EEG will be performed on Tuesday 09/15/15.

## 2015-09-14 NOTE — Progress Notes (Signed)
Bladder scanner showed 117 ml. Will continue to monitor.

## 2015-09-14 NOTE — Care Management Note (Signed)
Case Management Note  Patient Details  Name: [name] MRN: [id number] Date of Birth: [birth year]  Expected Discharge Date:    09/16/2015              Expected Discharge Plan:  [place]  In-House Referral:  NA  Discharge planning Services  CM Consult  Post Acute Care Choice:  Home Health Choice offered to:  Patient  DME Arranged:    DME Agency:     HH Arranged:  PT [place]:  [place]  Status of Service:  In process, will continue to follow  Medicare Important Message Given:    Date Medicare IM Given:    Medicare IM give by:    Date Additional Medicare IM Given:    Additional Medicare Important Message give by:     If discussed at [place] of Stay Meetings, dates discussed:    Additional Comments: Pt is from home, lives with her son and is mostly ind with ADL's. Pt admitted with AMS. Pt has a walker and uses a cane PRN. Pt's son feels she has periods of apnea when sleeping and is requesting a sleep study be ordered. Explained to son this is done OP and will be discussed with attending. Pt's son states she has been having increasing difficulty walking and would benefit from [facility] PT as she has had it in the past. PT eval will be ordered. Pt has used [facility] in the past and would like them again if ordered. Pt has no home O2 or neb machine prior to admission. Pt has no difficulty obtaining meds. Pt may benefit from [facility] referral. Pt plans on returning home at DC. Will cont to follow for DC planning needs.  Sherald Barge, RN 09/14/2015, 4:26 PM

## 2015-09-14 NOTE — Consult Note (Signed)
Vanessa A. Merlene Laughter, MD     [email or web address]          [name] is an 64 y.o. female.   ASSESSMENT/PLAN: Acute encephalopathy most likely due to toxic metabolic etiology is most notably medication effect in acute on chronic renal failure. 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 Insomnia.  RECOMMENDATION: Continue with current anti-epileptic medications. Continued discussion with the patient about taking medications as prescribed. The patient may need to have some administrative assistance in her medications.  The patient is a 64 year old black female who has had some episodes of acute alteration of mentation and was recently treated for this a few months ago. The etiology of that time was thought to be multifactorial toxic metabolic due to acute renal failure and medication issues. She presents again with the acute onset of confusion having hallucinations and apparently talking out of her head. The workup has been unrevealing including imaging with MRI. episodes of seizures during these events although she does have a history of frequent seizures. she has been compliant with seizure medications. She has returned to baseline today and tells me that she actually frankly took more of her medications. She took 2 over Ativan medication and wanted a half tablet of Ambien along with gabapentin and her other medications before going to bed. She woke up the following day with the confusion and altered mentation. She told us her to more of her medication because she has had some difficulty sleeping. She reports feeling well today. No episodes of chest pain, short of breath, focal numbness or weakness. The review of systems otherwise negative.  GENERAL: She is doing well today in no acute distress.  HEENT: Supple. Atraumatic normocephalic.   ABDOMEN: soft  EXTREMITIES: No edema   BACK: Normal.  SKIN: Normal by inspection.    MENTAL STATUS: Alert and oriented to month,  year and why she is in the hospital. [place] cognition are generally intact. Judgment and insight normal.   CRANIAL NERVES: Pupils are equal, round and reactive to light and accommodation; extra ocular movements are full, there is no significant nystagmus; visual fields are full; upper and lower facial muscles are normal in strength and symmetric, there is no flattening of the nasolabial folds; tongue is midline; uvula is midline; shoulder elevation is normal.  MOTOR: Normal tone, bulk and strength; no pronator drift.  COORDINATION: Left finger to nose is normal, right finger to nose is normal, No rest tremor; no intention tremor; no postural tremor; no bradykinesia.  REFLEXES: Deep tendon reflexes are symmetrical and normal. Babinski reflexes are flexor bilaterally.   SENSATION: Normal to light touch.       [[[[[[[[[[[[[[[[[[[[[[[[[MY CONSULT NOTE 05-2015 1. Encephalopathy this is most likely multifactorial in etiologies. The MRI findings, seizures and confusion that has been going on subacutely point to limbic encephalitis is a possible etiology. However, she also has evidence of acute renal failure and polypharmacy which are undoubtedly causative. She actually has improved since been admitted and these medications discontinue. It is unlikely that the patient has herpes encephalitis given the clinical picture. 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   Autoantibodies directed against the cell membrane/channels to respond to steroids and tend not to be associated with underlying neoplasm. Intra cytoplasmic autoantibodies/neuronal antibodies tend to be less responsive to steroids and are associated with underlying malignancy. Typical malignancies are small cell carcinoma, seminomanot germane in this case -, breast cancer and teratoma. I would recommend treatment with high-dose  steroids for the 3-5 days. I do not recommend tests for auto antibodies  as these are very extensive and the list is ever-increasing every few months ( This could be done later at an academic institution). I would recommend CT of the chest, mammography and ultrasound of the ovaries.  The patient is a 64 year old black female who tells me that she has a long-standing history of episodic headaches. 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 She has been seen by a neurologist in [place]. She has had Botox injections for these recalcitrant persistent headaches which tends to work but only tends to last for about a month after the injections. The patient was admitted to the hospital after presenting with a week onset of progressive lethargy and confusion. She was asked to seen here about a month ago for chest pain and generalized weakness. The workup was unrevealing. The patient is on multiple medications. She has has had a lot of these psychotropic medications held. Additionally, she did present with acute renal failure with a creatinine close to 3. Her baseline creatinine is simply 1.8. She is actually improved dramatically since being hospitalized. It appears that she was "talking out of her head". The patient tells me that she has had EEG done last month. She does not know the results of it. She has been having seizures. She has been on seizure medication over the last several months. The review systems otherwise negative.]]]]]]]]]]]]]]]]]]]]]  Blood pressure 120/44, pulse 58, temperature 98.1 F (36.7 C), temperature source Oral, resp. rate 18, height _0  (1.702 m), weight 93.668 kg (206 lb 8 oz), SpO2 93 %.  Past Medical History  Diagnosis Date  . Seizures ([place])   . Chronic headaches   . Essential hypertension   . Osteoporosis   . Depression   . 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 URI (upper respiratory infection)   . CKD (chronic kidney disease) stage 3, GFR 30-59 ml/min     . Restrictive lung disease   . Hyperlipidemia   . Right leg weakness   . Encephalopathy     Past Surgical History  Procedure Laterality Date  . Breast reduction surgery    . Abdominal hysterectomy    . Lung lobectomy Right     Cyst  . Appendectomy    . Cholecystectomy    . Back surgery  2002    Family History  Problem Relation Age of Onset  . Hypertension Mother   . Heart disease Mother   . Diabetes Mother   . Kidney disease Mother   . Arthritis Mother   . Hypertension Father     Social History:  reports that she quit smoking about 2 years ago. Her smoking use included Cigarettes. She has a 30 pack-year smoking history. She has never used smokeless tobacco. She reports that she does not drink alcohol or use illicit drugs.  Allergies:  Allergies  Allergen Reactions  . Asa [Aspirin]     High dose Aspirin causes itching but can take 53m   . Penicillins Hives  . Sulfonamide Derivatives Hives  . Tylenol With Codeine #3 [Acetaminophen-Codeine] Itching    Medications: Prior to Admission medications   Medication Sig Start Date End Date Taking? Authorizing Provider  amLODipine (NORVASC) 5 MG tablet Take 1 tablet (5 mg total) by mouth daily. Patient taking differently: Take 5 mg by mouth at bedtime.  03/13/15 03/12/16 Yes Mary-Margaret MHassell Done FNP  aspirin EC  81 MG tablet Take 81 mg by mouth daily.   Yes Historical Provider, MD  cholecalciferol (VITAMIN D) 1000 UNITS tablet Take 1,000 Units by mouth daily.   Yes Historical Provider, MD  DULoxetine (CYMBALTA) 30 MG capsule Take 1 capsule by mouth daily. 08/04/15  Yes Historical Provider, MD  DULoxetine (CYMBALTA) 60 MG capsule Take 60 mg by mouth daily.   Yes Historical Provider, MD  ferrous sulfate 325 (65 FE) MG tablet Take 325 mg by mouth daily with breakfast.   Yes Historical Provider, MD  gabapentin (NEURONTIN) 300 MG capsule Take 1 capsule (300 mg total) by mouth at bedtime. 05/31/15  Yes Erline Hau, MD   levETIRAcetam (KEPPRA) 500 MG tablet TAKE 1 TABLET BY MOUTH TWICE DAILY 07/09/15  Yes Mary-Margaret Hassell Done, FNP  LORazepam (ATIVAN) 1 MG tablet TAKE 1 TABLET BY MOUTH TWICE DAILY 08/04/15  Yes Mary-Margaret Hassell Done, FNP  meloxicam (MOBIC) 15 MG tablet Take 1 tablet by mouth daily. 05/26/15  Yes Historical Provider, MD  pantoprazole (PROTONIX) 40 MG tablet Take 1 tablet (40 mg total) by mouth daily. 03/13/15  Yes Mary-Margaret Hassell Done, FNP  sertraline (ZOLOFT) 50 MG tablet Take 1 tablet (50 mg total) by mouth daily. 08/13/15  Yes Eustaquio Maize, MD  simvastatin (ZOCOR) 10 MG tablet TAKE 1 TABLET BY MOUTH IN THE EVENING FOR CHOLESTEROL 09/11/15  Yes Chipper Herb, MD  zolpidem (AMBIEN) 10 MG tablet TAKE 1 TABLET BY MOUTH AT BEDTIME 08/19/15  Yes Mary-Margaret Hassell Done, FNP  calcitRIOL (ROCALTROL) 0.5 MCG capsule Take 0.5 mcg by mouth daily.  04/24/15   Historical Provider, MD  calcium acetate (PHOSLO) 667 MG capsule Take 1 capsule by mouth 3 (three) times daily. 03/12/15   Historical Provider, MD  COMBIVENT RESPIMAT 20-100 MCG/ACT AERS respimat INHALE 2 PUFFS INTO THE LUNGS TWICE A DAY 07/13/15   Claretta Fraise, MD  hydrALAZINE (APRESOLINE) 50 MG tablet Take 1 tablet (50 mg total) by mouth 3 (three) times daily. 05/31/15   Erline Hau, MD  losartan (COZAAR) 25 MG tablet Take 1 tablet by mouth daily. 06/25/15   Mary-Margaret Hassell Done, FNP  metoprolol (LOPRESSOR) 50 MG tablet Take 1 tablet (50 mg total) by mouth 2 (two) times daily. 03/13/15   Mary-Margaret Hassell Done, FNP    Scheduled Meds: . amLODipine  5 mg Oral Daily  . aspirin EC  81 mg Oral Daily  . calcium acetate  667 mg Oral TID WC  . heparin  5,000 Units Subcutaneous 3 times per day  . [START ON 09/15/2015] Influenza vac split quadrivalent PF  0.5 mL Intramuscular Tomorrow-1000  . ipratropium-albuterol  3 mL Inhalation QID  . levETIRAcetam  500 mg Oral BID  . metoprolol  50 mg Oral BID  . pantoprazole  40 mg Oral Daily  . [START ON  09/15/2015] pneumococcal 23 valent vaccine  0.5 mL Intramuscular Tomorrow-1000  . simvastatin  10 mg Oral q1800   Continuous Infusions: . dextrose 5 % and 0.9% NaCl 125 mL/hr at 09/14/15 1253   PRN Meds:.     Results for orders placed or performed during the hospital encounter of 09/13/15 (from the past 48 hour(s))  CBC with Differential     Status: Abnormal   Collection Time: 09/13/15  9:02 PM  Result Value Ref Range   WBC 8.3 4.0 - 10.5 K/uL   RBC 3.67 (L) 3.87 - 5.11 MIL/uL   Hemoglobin 11.7 (L) 12.0 - 15.0 g/dL   HCT 37.3 36.0 - 46.0 %  MCV 101.6 (H) 78.0 - 100.0 fL   MCH 31.9 26.0 - 34.0 pg   MCHC 31.4 30.0 - 36.0 g/dL   RDW 13.9 11.5 - 15.5 %   Platelets 398 150 - 400 K/uL   Neutrophils Relative % 78 %   Neutro Abs 6.5 1.7 - 7.7 K/uL   Lymphocytes Relative 10 %   Lymphs Abs 0.8 0.7 - 4.0 K/uL   Monocytes Relative 10 %   Monocytes Absolute 0.9 0.1 - 1.0 K/uL   Eosinophils Relative 1 %   Eosinophils Absolute 0.1 0.0 - 0.7 K/uL   Basophils Relative 1 %   Basophils Absolute 0.0 0.0 - 0.1 K/uL  Comprehensive metabolic panel     Status: Abnormal   Collection Time: 09/13/15  9:02 PM  Result Value Ref Range   Sodium 142 135 - 145 mmol/L   Potassium 3.9 3.5 - 5.1 mmol/L   Chloride 109 101 - 111 mmol/L   CO2 25 22 - 32 mmol/L   Glucose, Bld 117 (H) 65 - 99 mg/dL   BUN 34 (H) 6 - 20 mg/dL   Creatinine, Ser 3.42 (H) 0.44 - 1.00 mg/dL   Calcium 9.0 8.9 - 10.3 mg/dL   Total Protein 7.2 6.5 - 8.1 g/dL   Albumin 3.8 3.5 - 5.0 g/dL   AST 27 15 - 41 U/L   ALT 13 (L) 14 - 54 U/L   Alkaline Phosphatase 85 38 - 126 U/L   Total Bilirubin 0.5 0.3 - 1.2 mg/dL   GFR calc non Af Amer 13 (L) >60 mL/min   GFR calc Af Amer 15 (L) >60 mL/min    Comment: (NOTE) The eGFR has been calculated using the CKD EPI equation. This calculation has not been validated in all clinical situations. eGFR's persistently <60 mL/min signify possible Chronic Kidney Disease.    Anion gap 8 5 - 15   Troponin I     Status: None   Collection Time: 09/13/15  9:02 PM  Result Value Ref Range   Troponin I <0.03 <0.031 ng/mL    Comment:        NO INDICATION OF MYOCARDIAL INJURY.   Lactic acid, plasma     Status: None   Collection Time: 09/13/15  9:02 PM  Result Value Ref Range   Lactic Acid, Venous 1.7 0.5 - 2.0 mmol/L  Ethanol     Status: None   Collection Time: 09/13/15  9:02 PM  Result Value Ref Range   Alcohol, Ethyl (B) <5 <5 mg/dL    Comment:        LOWEST DETECTABLE LIMIT FOR SERUM ALCOHOL IS 5 mg/dL FOR MEDICAL PURPOSES ONLY   Acetaminophen level     Status: Abnormal   Collection Time: 09/13/15  9:02 PM  Result Value Ref Range   Acetaminophen (Tylenol), Serum <10 (L) 10 - 30 ug/mL    Comment:        THERAPEUTIC CONCENTRATIONS VARY SIGNIFICANTLY. A RANGE OF 10-30 ug/mL MAY BE AN EFFECTIVE CONCENTRATION FOR MANY PATIENTS. HOWEVER, SOME ARE BEST TREATED AT CONCENTRATIONS OUTSIDE THIS RANGE. ACETAMINOPHEN CONCENTRATIONS >150 ug/mL AT 4 HOURS AFTER INGESTION AND >50 ug/mL AT 12 HOURS AFTER INGESTION ARE OFTEN ASSOCIATED WITH TOXIC REACTIONS.   Salicylate level     Status: None   Collection Time: 09/13/15  9:02 PM  Result Value Ref Range   Salicylate Lvl <0.6 2.8 - 30.0 mg/dL  Protime-INR     Status: None   Collection Time: 09/13/15  9:02 PM  Result Value Ref Range   Prothrombin Time 14.2 11.6 - 15.2 seconds   INR 1.08 0.00 - 1.49  TSH     Status: None   Collection Time: 09/13/15  9:02 PM  Result Value Ref Range   TSH 0.393 0.350 - 4.500 uIU/mL  Urine rapid drug screen (hosp performed)     Status: Abnormal   Collection Time: 09/14/15  7:01 AM  Result Value Ref Range   Opiates NONE DETECTED NONE DETECTED   Cocaine NONE DETECTED NONE DETECTED   Benzodiazepines POSITIVE (A) NONE DETECTED   Amphetamines NONE DETECTED NONE DETECTED   Tetrahydrocannabinol NONE DETECTED NONE DETECTED   Barbiturates NONE DETECTED NONE DETECTED    Comment:        DRUG SCREEN  FOR MEDICAL PURPOSES ONLY.  IF CONFIRMATION IS NEEDED FOR ANY PURPOSE, NOTIFY LAB WITHIN 5 DAYS.        LOWEST DETECTABLE LIMITS FOR URINE DRUG SCREEN Drug Class       Cutoff (ng/mL) Amphetamine      1000 Barbiturate      200 Benzodiazepine   478 Tricyclics       295 Opiates          300 Cocaine          300 THC              50   Urinalysis, Routine w reflex microscopic     Status: Abnormal   Collection Time: 09/14/15  7:02 AM  Result Value Ref Range   Color, Urine YELLOW YELLOW   APPearance CLEAR CLEAR   Specific Gravity, Urine >1.030 (H) 1.005 - 1.030   pH 5.5 5.0 - 8.0   Glucose, UA NEGATIVE NEGATIVE mg/dL   Hgb urine dipstick NEGATIVE NEGATIVE   Bilirubin Urine SMALL (A) NEGATIVE   Ketones, ur TRACE (A) NEGATIVE mg/dL   Protein, ur TRACE (A) NEGATIVE mg/dL   Urobilinogen, UA 0.2 0.0 - 1.0 mg/dL   Nitrite NEGATIVE NEGATIVE   Leukocytes, UA NEGATIVE NEGATIVE  Urine microscopic-add on     Status: None   Collection Time: 09/14/15  7:02 AM  Result Value Ref Range   Squamous Epithelial / LPF RARE RARE   WBC, UA 0-2 <3 WBC/hpf  Vitamin B12     Status: None   Collection Time: 09/14/15  7:43 AM  Result Value Ref Range   Vitamin B-12 566 180 - 914 pg/mL    Comment: (NOTE) This assay is not validated for testing neonatal or myeloproliferative syndrome specimens for Vitamin B12 levels. Performed at [place] metabolic panel     Status: Abnormal   Collection Time: 09/14/15  7:50 AM  Result Value Ref Range   Sodium 143 135 - 145 mmol/L   Potassium 4.2 3.5 - 5.1 mmol/L   Chloride 108 101 - 111 mmol/L   CO2 24 22 - 32 mmol/L   Glucose, Bld 114 (H) 65 - 99 mg/dL   BUN 36 (H) 6 - 20 mg/dL   Creatinine, Ser 3.47 (H) 0.44 - 1.00 mg/dL   Calcium 9.4 8.9 - 10.3 mg/dL   GFR calc non Af Amer 13 (L) >60 mL/min   GFR calc Af Amer 15 (L) >60 mL/min    Comment: (NOTE) The eGFR has been calculated using the CKD EPI equation. This calculation has not been validated  in all clinical situations. eGFR's persistently <60 mL/min signify possible Chronic Kidney Disease.    Anion gap 11 5 - 15  Studies/Results:  BRAIN MRI Unremarkble      Eural Holzschuh A. Merlene Richardson, M.D.  Diplomate, Tax adviser of Psychiatry and Neurology ( Neurology). 09/14/2015, 5:11 PM

## 2015-09-14 NOTE — Progress Notes (Signed)
Bladder scan showed patient had 220 ml of urine. Per order did an in and out cath and 215 ml was recorded. Patient tolerated catheterization well, sterile technique was used, and peri care was performed. Urine sample will be sent to lab. Will continue to monitor patient status.

## 2015-09-15 ENCOUNTER — Observation Stay (HOSPITAL_COMMUNITY)
Admit: 2015-09-15 | Discharge: 2015-09-15 | Disposition: A | Discharge status: Home / Self Care | Payer: Medicare Other | Attending: Neurology | Admitting: Neurology

## 2015-09-15 DIAGNOSIS — N179 Acute kidney failure, unspecified: Secondary | ICD-10-CM | POA: Diagnosis not present

## 2015-09-15 DIAGNOSIS — R4182 Altered mental status, unspecified: Secondary | ICD-10-CM | POA: Diagnosis not present

## 2015-09-15 DIAGNOSIS — D472 Monoclonal gammopathy: Secondary | ICD-10-CM | POA: Diagnosis not present

## 2015-09-15 DIAGNOSIS — G9340 Encephalopathy, unspecified: Secondary | ICD-10-CM

## 2015-09-15 LAB — CBC
HCT: 33.8 % — ABNORMAL LOW (ref 36.0–46.0)
Hemoglobin: 10.4 g/dL — ABNORMAL LOW (ref 12.0–15.0)
MCH: 31.7 pg (ref 26.0–34.0)
MCHC: 30.8 g/dL (ref 30.0–36.0)
MCV: 103 fL — ABNORMAL HIGH (ref 78.0–100.0)
Platelets: 334 10*3/uL (ref 150–400)
RBC: 3.28 MIL/uL — ABNORMAL LOW (ref 3.87–5.11)
RDW: 13.8 % (ref 11.5–15.5)
WBC: 6.2 10*3/uL (ref 4.0–10.5)

## 2015-09-15 LAB — BASIC METABOLIC PANEL
Anion gap: 6 (ref 5–15)
BUN: 32 mg/dL — ABNORMAL HIGH (ref 6–20)
CO2: 21 mmol/L — ABNORMAL LOW (ref 22–32)
Calcium: 8.1 mg/dL — ABNORMAL LOW (ref 8.9–10.3)
Chloride: 116 mmol/L — ABNORMAL HIGH (ref 101–111)
Creatinine, Ser: 2.8 mg/dL — ABNORMAL HIGH (ref 0.44–1.00)
GFR calc Af Amer: 19 mL/min — ABNORMAL LOW (ref >60)
GFR calc non Af Amer: 17 mL/min — ABNORMAL LOW (ref >60)
Glucose, Bld: 134 mg/dL — ABNORMAL HIGH (ref 65–99)
Potassium: 4.2 mmol/L (ref 3.5–5.1)
Sodium: 143 mmol/L (ref 135–145)

## 2015-09-15 LAB — HIV ANTIBODY (ROUTINE TESTING W REFLEX): HIV Screen 4th Generation wRfx: NONREACTIVE

## 2015-09-15 MED ORDER — SERTRALINE HCL 50 MG PO TABS
50.0000 mg | ORAL_TABLET | Freq: Every day | ORAL | Status: DC
  Administered 2015-09-15 – 2015-09-16 (×2): 50 mg via ORAL
  Filled 2015-09-15 (×2): qty 1

## 2015-09-15 MED ORDER — IPRATROPIUM-ALBUTEROL 0.5-2.5 (3) MG/3ML IN SOLN
3.0000 mL | Freq: Two times a day (BID) | RESPIRATORY_TRACT | Status: DC
  Administered 2015-09-15 – 2015-09-16 (×3): 3 mL via RESPIRATORY_TRACT
  Filled 2015-09-15 (×3): qty 3

## 2015-09-15 MED ORDER — GABAPENTIN 300 MG PO CAPS
300.0000 mg | ORAL_CAPSULE | Freq: Every day | ORAL | Status: DC
  Administered 2015-09-15: 300 mg via ORAL
  Filled 2015-09-15: qty 1

## 2015-09-15 MED ORDER — DULOXETINE HCL 60 MG PO CPEP
60.0000 mg | ORAL_CAPSULE | Freq: Every day | ORAL | Status: DC
  Administered 2015-09-15 – 2015-09-16 (×2): 60 mg via ORAL
  Filled 2015-09-15 (×2): qty 1

## 2015-09-15 NOTE — Progress Notes (Signed)
Patient becoming paranoid. Scared that someone is trying to get her. Dr. Jerilee Hoh notified.

## 2015-09-15 NOTE — Progress Notes (Addendum)
[facility] PROGRESS NOTE  [name] [id number] DOB: 16-Aug-1951 DOA: 09/13/2015 PCP: Chevis Pretty, FNP  Assessment/Plan: Acute Encephalopathy -Resolved this am. -With rapid improvement have to strongly consider polypharmacy as etiology. 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 -Await neurology input.  ARF on CKD Stage III -Cr improving. -Baseline Cr appears to be around 2.0.  Seizure Disorder -no active seizures while in the hospital. -Continue Keppra.  MGUS -Continue OP follow up with oncology as scheduled.  Acute Urinary Retention -S/p placement of foley catheter after required I and O cath x 3. -Will Dc with foley and OP follow up with urology.  Chronic Migraines -Continue OP follow up with neurologist for Botox injections.  Code Status: Full Code Family Communication: Sons at bedside updated on plan of care and all questions answered.  Disposition Plan: Home when ready, likely in 2 4hours.   Consultants:  Neurology   Antibiotics:  None   Subjective: Oriented, pleasant, no complaints this am.  Objective: Filed Vitals:   09/14/15 2037 09/14/15 2038 09/15/15 0500 09/15/15 0757  BP:   143/63   Pulse:  70 73   Temp:  99 F (37.2 C) 98.7 F (37.1 C)   TempSrc:  Oral Oral   Resp:   18   Height:      Weight:      SpO2: 98% 100% 92% 92%    Intake/Output Summary (Last 24 hours) at 09/15/15 0950 Last data filed at 09/14/15 2300  Gross per 24 hour  Intake   1980 ml  Output    250 ml  Net   1730 ml   [facility] Weights   09/13/15 2009 09/14/15 0117  Weight: 98.431 kg (217 lb) 93.668 kg (206 lb 8 oz)    Exam:   General:  AA Ox3  Cardiovascular: RRR  Respiratory: CTA B  Abdomen: S/NT/ND/+BS  Extremities: no C/C/E   Neurologic:  Non-focal  Data Reviewed: Basic Metabolic Panel:  Recent Labs Lab 09/13/15 2102 09/14/15 0750 09/15/15 0617  NA 142 143 143  K 3.9  4.2 4.2  CL 109 108 116*  CO2 25 24 21*  GLUCOSE 117* 114* 134*  BUN 34* 36* 32*  CREATININE 3.42* 3.47* 2.80*  CALCIUM 9.0 9.4 8.1*   Liver Function Tests:  Recent Labs Lab 09/13/15 2102  AST 27  ALT 13*  ALKPHOS 85  BILITOT 0.5  PROT 7.2  ALBUMIN 3.8   No results for input(s): LIPASE, AMYLASE in the last 168 hours. No results for input(s): AMMONIA in the last 168 hours. CBC:  Recent Labs Lab 09/13/15 2102 09/15/15 0617  WBC 8.3 6.2  NEUTROABS 6.5  --   HGB 11.7* 10.4*  HCT 37.3 33.8*  MCV 101.6* 103.0*  PLT 398 334   Cardiac Enzymes:  Recent Labs Lab 09/13/15 2102  TROPONINI <0.03   BNP (last 3 results)  Recent Labs  05/28/15 2103  BNP 38.0    ProBNP (last 3 results) No results for input(s): PROBNP in the last 8760 hours.  CBG: No results for input(s): GLUCAP in the last 168 hours.  No results found for this or any previous visit (from the past 240 hour(s)).   Studies: Dg Chest 1 View  09/13/2015  CLINICAL DATA:  Altered mental status. Confused today. History of hypertension and seizures and kidney disease. EXAM: CHEST 1 VIEW COMPARISON:  05/28/2015. FINDINGS: Lung volumes are low. There is crowding of the bronchovascular structures, particularly  at the right base due to elevation right hemidiaphragm. There is no convincing pneumonia or edema. Cardiac silhouette is normal in size. No mediastinal or hilar masses. No pleural effusion or pneumothorax. Bony thorax is intact. IMPRESSION: Somewhat limited study due to low lung volumes and an elevated right hemidiaphragm. Allowing for this, there is no evidence of acute cardiopulmonary disease. Electronically Signed   By: Lajean Manes M.D.   On: 09/13/2015 21:53   Ct Head Wo Contrast  09/13/2015  CLINICAL DATA:  Pt here for Altered Mental status. Per family, pt been looking for "chicken livers under her bed". Pt has been having periods of confusion. Pt has been receiving Botox injections (has had approx.  31 injections) for headaches. Sta.*comment was truncated* EXAM: CT HEAD WITHOUT CONTRAST TECHNIQUE: Contiguous axial images were obtained from the base of the skull through the vertex without intravenous contrast. COMPARISON:  02/14/2013 FINDINGS: The ventricles are normal in size and configuration. There are no parenchymal masses or mass effect. There are subtle areas of white matter hypoattenuation consistent with chronic microvascular ischemic change. There is no evidence of a cortical infarct. There are no extra-axial masses or abnormal fluid collections. There is no intracranial hemorrhage. Visualized sinuses and mastoid air cells are clear. No skull lesion. IMPRESSION: 1. No acute intracranial abnormalities. 2. Minor chronic microvascular ischemic change. Stable appearance from the prior study. Electronically Signed   By: Lajean Manes M.D.   On: 09/13/2015 21:48   Mr Brain Wo Contrast  09/14/2015  CLINICAL DATA:  Delirium. EXAM: MRI HEAD WITHOUT CONTRAST TECHNIQUE: Multiplanar, multiecho pulse sequences of the brain and surrounding structures were obtained without intravenous contrast. COMPARISON:  Head CT 09/13/2015 and MRI 05/29/2015 FINDINGS: There is no evidence of acute infarct, intracranial hemorrhage, mass, midline shift, or extra-axial fluid collection. Mild cerebral atrophy is within normal limits for age. Periventricular and subcortical cerebral white matter T2 hyperintensities are nonspecific but compatible with mild chronic small vessel ischemic disease, overall slightly less conspicuous than on the prior study due to the some motion artifact on the current examination. Orbits are unremarkable. Paranasal sinuses and mastoid air cells are clear. Major intracranial vascular flow voids are preserved. IMPRESSION: 1. No acute intracranial abnormality. 2. Mild chronic small vessel ischemic disease. Electronically Signed   By: Logan Bores M.D.   On: 09/14/2015 08:36   US Renal  09/14/2015   CLINICAL DATA:  Acute kidney injury. EXAM: RENAL / URINARY TRACT ULTRASOUND COMPLETE COMPARISON:  None. FINDINGS: Right Kidney: Length: 9.7 cm. Echogenicity within normal limits. Cortical thinning is noted. No mass or hydronephrosis visualized. Left Kidney: Length: 9.3 cm. Echogenicity within normal limits. Cortical thinning is noted. No mass or hydronephrosis visualized. Bladder: Appears normal for degree of bladder distention. Ureteral jets are not visualized. IMPRESSION: Mild bilateral renal atrophy. No hydronephrosis or renal obstruction is noted. Electronically Signed   By: Marijo Conception, M.D.   On: 09/14/2015 15:16    Scheduled Meds: . amLODipine  5 mg Oral Daily  . aspirin EC  81 mg Oral Daily  . calcium acetate  667 mg Oral TID WC  . heparin  5,000 Units Subcutaneous 3 times per day  . ipratropium-albuterol  3 mL Inhalation BID  . levETIRAcetam  500 mg Oral BID  . metoprolol  50 mg Oral BID  . pantoprazole  40 mg Oral Daily  . pneumococcal 23 valent vaccine  0.5 mL Intramuscular Tomorrow-1000  . simvastatin  10 mg Oral q1800   Continuous Infusions: .  dextrose 5 % and 0.9% NaCl 125 mL/hr at 09/15/15 0813    Principal Problem:   Acute encephalopathy Active Problems:   Depression   Seizures (HCC)   MGUS (monoclonal gammopathy of unknown significance)   AKI (acute kidney injury) ([place])   Delirium    Time spent: 25 minutes. Greater than 50% of this time was spent in direct contact with the patient coordinating care.    Lelon Frohlich  [facility] Pager [phone]  If 7PM-7AM, please contact night-coverage at [email or web address], password [facility] 09/15/2015, 9:50 AM  LOS: 2 days       ADDENDUM: Called by RN about patient becoming more "paranoid" as the day progresses. Will restart zoloft, cymbalta and neurontin.  Domingo Mend, MD [facility] Pager: [phone]

## 2015-09-15 NOTE — Consult Note (Signed)
   [facility] CM Inpatient Consult   09/15/2015  HILDRED RYKS 1951/06/28 [id number]  Spoke with patient at bedside regarding [facility] services. Patient does not want to participate with [facility] at this time. Patient given [facility] brochure and contact information for future reference.  Of note, [facility] Care Management services would not replace or interfere with any services that are arranged by inpatient case management or social work. For additional questions or referrals please contact:  Royetta Crochet. Laymond Purser, RN, BSN, [facility] Liaison [phone]

## 2015-09-15 NOTE — Progress Notes (Signed)
EEG Completed; Results Pending  

## 2015-09-16 DIAGNOSIS — G9340 Encephalopathy, unspecified: Secondary | ICD-10-CM | POA: Diagnosis not present

## 2015-09-16 DIAGNOSIS — N179 Acute kidney failure, unspecified: Secondary | ICD-10-CM | POA: Diagnosis not present

## 2015-09-16 LAB — VITAMIN B1: Vitamin B1 (Thiamine): 93.1 nmol/L (ref 66.5–200.0)

## 2015-09-16 NOTE — Discharge Summary (Signed)
Physician Discharge Summary  [name] [id number] DOB: 10-31-1951 DOA: 09/13/2015  PCP: Chevis Pretty, FNP  Admit date: 09/13/2015 Discharge date: 09/16/2015  Time spent: 45 minutes  Recommendations for Outpatient Follow-up:  -Will be discharged home today. -Advised to follow up with PCP in 2 weeks. -Will need assistance with medication management and this has been discussed with 2 sons.   Discharge Diagnoses:  Principal Problem:   Acute encephalopathy Active Problems:   Depression   Seizures (HCC)   MGUS (monoclonal gammopathy of unknown significance)   AKI (acute kidney injury) ([place])   Delirium   Discharge Condition: Stable and improved  Filed Weights   09/13/15 2009 09/14/15 0117  Weight: 98.431 kg (217 lb) 93.668 kg (206 lb 8 oz)    History of present illness:  As per Dr. Marin Comment 10/16: [name] is an 64 y.o. female with multiple medical problem, staying at home with her son, having hx of Seizure on Keppra, MGUS, HTN, prior admission for limbic encephalitis, CKD, brought to the ER as she was confused, having visual hallucination, and intermittent confusion intersperse with period of perfect lucidity. Work up in the ER included elevated Cr up to 3.4, with normal Cr, and unremarkable WBC and HB. Her head CT showed no acute process, and her MCV was noted to be elevated. She does not drink alcohol. It should be noted that when she was admitted last time, it was similar presentation, and it was felt that she was having multiple meds causing ADR. She was seen in consultation with Dr Sampson Goon, and was given IV Steroids for possible autoimmune encephalitis. Hospitalist was asked to admit her for altered mental status once again.   Hospital Course:   Acute Encephalopathy -Resolved this am. -With rapid improvement have to strongly consider polypharmacy as etiology. She states the night prior to admission se couldn't rest with just one ativan, so  she took a second one with her neurontin and a half hour later decided to take an [place] as well. Her sons have been advised to assist her with medication management, as this is her second admission for the same. 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 -Has been seen by neurology who agrees with current assessment and plan.  ARF on CKD Stage III -Cr improving. -Baseline Cr appears to be around 2.0. -Currently 2.8 at time of DC. -ARB remains on hold.  Seizure Disorder -no active seizures while in the hospital. -Continue Keppra.  MGUS -Continue OP follow up with oncology as scheduled.  Acute Urinary Retention -S/p placement of foley catheter after required I and O cath x 3. -Will Dc with foley and OP follow up with urology.  Chronic Migraines -Continue OP follow up with neurologist for Botox injections.   Procedures:  None   Consultations:  Neurology, Dr. Merlene Laughter  Discharge Instructions  Discharge Instructions    Diet - low sodium heart healthy    Complete by:  As directed      Increase activity slowly    Complete by:  As directed             Medication List    STOP taking these medications        LORazepam 1 MG tablet  Commonly known as:  ATIVAN     losartan 25 MG tablet  Commonly known as:  COZAAR     meloxicam 15 MG tablet  Commonly known as:  MOBIC  zolpidem 10 MG tablet  Commonly known as:  AMBIEN      TAKE these medications        amLODipine 5 MG tablet  Commonly known as:  NORVASC  Take 1 tablet (5 mg total) by mouth daily.     aspirin EC 81 MG tablet  Take 81 mg by mouth daily.     calcitRIOL 0.5 MCG capsule  Commonly known as:  ROCALTROL  Take 0.5 mcg by mouth daily.     calcium acetate 667 MG capsule  Commonly known as:  PHOSLO  Take 1 capsule by mouth 3 (three) times daily.     cholecalciferol 1000 UNITS tablet  Commonly known as:  VITAMIN D  Take 1,000 Units by mouth daily.       COMBIVENT RESPIMAT 20-100 MCG/ACT Aers respimat  Generic drug:  Ipratropium-Albuterol  INHALE 2 PUFFS INTO THE LUNGS TWICE A DAY     DULoxetine 60 MG capsule  Commonly known as:  CYMBALTA  Take 60 mg by mouth daily.     ferrous sulfate 325 (65 FE) MG tablet  Take 325 mg by mouth daily with breakfast.     gabapentin 300 MG capsule  Commonly known as:  NEURONTIN  Take 1 capsule (300 mg total) by mouth at bedtime.     hydrALAZINE 50 MG tablet  Commonly known as:  APRESOLINE  Take 1 tablet (50 mg total) by mouth 3 (three) times daily.     levETIRAcetam 500 MG tablet  Commonly known as:  KEPPRA  TAKE 1 TABLET BY MOUTH TWICE DAILY     metoprolol 50 MG tablet  Commonly known as:  LOPRESSOR  Take 1 tablet (50 mg total) by mouth 2 (two) times daily.     pantoprazole 40 MG tablet  Commonly known as:  PROTONIX  Take 1 tablet (40 mg total) by mouth daily.     sertraline 50 MG tablet  Commonly known as:  ZOLOFT  Take 1 tablet (50 mg total) by mouth daily.     simvastatin 10 MG tablet  Commonly known as:  ZOCOR  TAKE 1 TABLET BY MOUTH IN THE EVENING FOR CHOLESTEROL       Allergies  Allergen Reactions  . Asa [Aspirin]     High dose Aspirin causes itching but can take 81mg    . Penicillins Hives  . Sulfonamide Derivatives Hives  . Tylenol With Codeine #3 [Acetaminophen-Codeine] Itching       Follow-up Information    Follow up with Chevis Pretty, FNP. Schedule an appointment as soon as possible for a visit in 2 weeks.   Specialty:  Family Medicine   Contact information:   [place] [address] [phone]        The results of significant diagnostics from this hospitalization (including imaging, microbiology, ancillary and laboratory) are listed below for reference.    Significant Diagnostic Studies: Dg Chest 1 View  09/13/2015  CLINICAL DATA:  Altered mental status. Confused today. History of hypertension and seizures and kidney  disease. EXAM: CHEST 1 VIEW COMPARISON:  05/28/2015. FINDINGS: Lung volumes are low. There is crowding of the bronchovascular structures, particularly at the right base due to elevation right hemidiaphragm. There is no convincing pneumonia or edema. Cardiac silhouette is normal in size. No mediastinal or hilar masses. No pleural effusion or pneumothorax. Bony thorax is intact. IMPRESSION: Somewhat limited study due to low lung volumes and an elevated right hemidiaphragm. Allowing for this, there is no evidence of  acute cardiopulmonary disease. Electronically Signed   By: Lajean Manes M.D.   On: 09/13/2015 21:53   Ct Head Wo Contrast  09/13/2015  CLINICAL DATA:  Pt here for Altered Mental status. Per family, pt been looking for "chicken livers under her bed". Pt has been having periods of confusion. Pt has been receiving Botox injections (has had approx. 31 injections) for headaches. Sta.*comment was truncated* EXAM: CT HEAD WITHOUT CONTRAST TECHNIQUE: Contiguous axial images were obtained from the base of the skull through the vertex without intravenous contrast. COMPARISON:  02/14/2013 FINDINGS: The ventricles are normal in size and configuration. There are no parenchymal masses or mass effect. There are subtle areas of white matter hypoattenuation consistent with chronic microvascular ischemic change. There is no evidence of a cortical infarct. There are no extra-axial masses or abnormal fluid collections. There is no intracranial hemorrhage. Visualized sinuses and mastoid air cells are clear. No skull lesion. IMPRESSION: 1. No acute intracranial abnormalities. 2. Minor chronic microvascular ischemic change. Stable appearance from the prior study. Electronically Signed   By: Lajean Manes M.D.   On: 09/13/2015 21:48   Mr Brain Wo Contrast  09/14/2015  CLINICAL DATA:  Delirium. EXAM: MRI HEAD WITHOUT CONTRAST TECHNIQUE: Multiplanar, multiecho pulse sequences of the brain and surrounding structures were  obtained without intravenous contrast. COMPARISON:  Head CT 09/13/2015 and MRI 05/29/2015 FINDINGS: There is no evidence of acute infarct, intracranial hemorrhage, mass, midline shift, or extra-axial fluid collection. Mild cerebral atrophy is within normal limits for age. Periventricular and subcortical cerebral white matter T2 hyperintensities are nonspecific but compatible with mild chronic small vessel ischemic disease, overall slightly less conspicuous than on the prior study due to the some motion artifact on the current examination. Orbits are unremarkable. Paranasal sinuses and mastoid air cells are clear. Major intracranial vascular flow voids are preserved. IMPRESSION: 1. No acute intracranial abnormality. 2. Mild chronic small vessel ischemic disease. Electronically Signed   By: Logan Bores M.D.   On: 09/14/2015 08:36   US Renal  09/14/2015  CLINICAL DATA:  Acute kidney injury. EXAM: RENAL / URINARY TRACT ULTRASOUND COMPLETE COMPARISON:  None. FINDINGS: Right Kidney: Length: 9.7 cm. Echogenicity within normal limits. Cortical thinning is noted. No mass or hydronephrosis visualized. Left Kidney: Length: 9.3 cm. Echogenicity within normal limits. Cortical thinning is noted. No mass or hydronephrosis visualized. Bladder: Appears normal for degree of bladder distention. Ureteral jets are not visualized. IMPRESSION: Mild bilateral renal atrophy. No hydronephrosis or renal obstruction is noted. Electronically Signed   By: Marijo Conception, M.D.   On: 09/14/2015 15:16    Microbiology: No results found for this or any previous visit (from the past 240 hour(s)).   Labs: Basic Metabolic Panel:  Recent Labs Lab 09/13/15 2102 09/14/15 0750 09/15/15 0617  NA 142 143 143  K 3.9 4.2 4.2  CL 109 108 116*  CO2 25 24 21*  GLUCOSE 117* 114* 134*  BUN 34* 36* 32*  CREATININE 3.42* 3.47* 2.80*  CALCIUM 9.0 9.4 8.1*   Liver Function Tests:  Recent Labs Lab 09/13/15 2102  AST 27  ALT 13*    ALKPHOS 85  BILITOT 0.5  PROT 7.2  ALBUMIN 3.8   No results for input(s): LIPASE, AMYLASE in the last 168 hours. No results for input(s): AMMONIA in the last 168 hours. CBC:  Recent Labs Lab 09/13/15 2102 09/15/15 0617  WBC 8.3 6.2  NEUTROABS 6.5  --   HGB 11.7* 10.4*  HCT 37.3 33.8*  MCV  101.6* 103.0*  PLT 398 334   Cardiac Enzymes:  Recent Labs Lab 09/13/15 2102  TROPONINI <0.03   BNP: BNP (last 3 results)  Recent Labs  05/28/15 2103  BNP 38.0    ProBNP (last 3 results) No results for input(s): PROBNP in the last 8760 hours.  CBG: No results for input(s): GLUCAP in the last 168 hours.     SignedLelon Frohlich  [facility] Pager: [phone] 09/16/2015, 11:22 AM

## 2015-09-16 NOTE — Care Management Note (Signed)
Case Management Note  Patient Details  Name: JAMYAH PEETZ MRN: [id number] Date of Birth: August 24, 1951  Expected Discharge Date:                  Expected Discharge Plan:  [place]  In-House Referral:  NA  Discharge planning Services  CM Consult  Post Acute Care Choice:  Home Health Choice offered to:  Patient  DME Arranged:    DME Agency:     HH Arranged:  PT, RN [place] Agency:  [place]  Status of Service:  Completed, signed off  Medicare Important Message Given:    Date Medicare IM Given:    Medicare IM give by:    Date Additional Medicare IM Given:    Additional Medicare Important Message give by:     If discussed at [place] of Stay Meetings, dates discussed:    Additional Comments: Pt discharging home today with [facility] RN/PT services. Romualdo Bolk, of [facility], per pt's choice, notified of DC and will obtain pt info from chart. Pt aware HH has 48 hours to start services. Pt has no DME needs. PT has refused [facility] services at DC. Pt's referral for sleep study has been faxed to sleep center. Pt's son has requested list of mental health agencies for pt. Agency list requested by CSW and given to pt. Pt has no further CM needs at this time.  Sherald Barge, RN 09/16/2015, 11:42 AM

## 2015-09-16 NOTE — Progress Notes (Signed)
Patient alert and oriented, independent, VSS, pt. Tolerating diet well. No complaints of pain or nausea. Pt. Had IV removed tip intact. Pt. Had prescriptions given. Pt. Discharged with foley intact and appointment to see urologist.  Pt. Voiced understanding of discharge instructions with no further questions. Pt. Discharged via wheelchair with auxilliary.

## 2015-09-17 ENCOUNTER — Ambulatory Visit (INDEPENDENT_AMBULATORY_CARE_PROVIDER_SITE_OTHER): Payer: Medicare Other | Admitting: Pediatrics

## 2015-09-17 ENCOUNTER — Encounter: Payer: Self-pay | Admitting: Pediatrics

## 2015-09-17 VITALS — Temp 97.6°F | Ht 67.0 in | Wt 211.6 lb

## 2015-09-17 DIAGNOSIS — I10 Essential (primary) hypertension: Secondary | ICD-10-CM

## 2015-09-17 DIAGNOSIS — R339 Retention of urine, unspecified: Secondary | ICD-10-CM | POA: Diagnosis not present

## 2015-09-17 MED ORDER — AMLODIPINE BESYLATE 10 MG PO TABS: 10.0000 mg | ORAL_TABLET | Freq: Every day | ORAL | Status: DC

## 2015-09-17 NOTE — Patient Instructions (Signed)
[facility] [place] [facility] [place] for [employer]

## 2015-09-17 NOTE — Progress Notes (Signed)
Subjective:    Patient ID: [name], female    DOB: [birth year], 64 y.o.   MRN: [id number]  CC: hospital f/u  HPI: [name] is a 64 y.o. female presenting on 09/17/2015 for Follow-up  Feeling weak today. Son here as well, says she has been weaker than usual. Recently hospitalized for AMS, thought to be due to medication overdose involving benzodiazepine, ambien and two SSRIs. Altering Medications stopped during the hospitalization. Was discharged yesterday. Still feels slightly weak compared to before. Lives with her son. Feels safe at home.  No falls. Has been normal self no further AMS since discharge.   Relevant past medical, surgical, family and social history reviewed and updated as indicated. Interim medical history since our last visit reviewed. Allergies and medications reviewed and updated.   ROS: Per HPI unless specifically indicated above  Past Medical History Patient Active Problem List   Diagnosis Date Noted  . Delirium 09/13/2015  . Malnutrition of moderate degree ([place]) 05/29/2015  . Acute encephalopathy 05/28/2015  . Acute renal failure superimposed on stage 3 chronic kidney disease ([place]) 05/28/2015  . Protein-calorie malnutrition, severe ([place]) 04/24/2015  . Vomiting 04/11/2015  . Metabolic acidosis [id number]  . Diarrhea 04/11/2015  . AKI (acute kidney injury) ([place]) 04/11/2015  . Nausea vomiting and diarrhea   . Gastroesophageal reflux disease without esophagitis 03/13/2015  . Neuropathy ([place]) 03/13/2015  . 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 Insomnia 03/25/2013  . Depression 03/25/2013  . Seizures ([place]) 03/25/2013  . Hyperlipidemia 03/25/2013  . 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 Essential hypertension 04/21/2010  . DOE (dyspnea on exertion) 04/21/2010  . CHEST PAIN-UNSPECIFIED 04/21/2010    Current Outpatient Prescriptions  Medication Sig Dispense Refill  . amLODipine (NORVASC) 10 MG tablet  Take 1 tablet (10 mg total) by mouth daily. 30 tablet 5  . aspirin EC 81 MG tablet Take 81 mg by mouth daily.    . calcitRIOL (ROCALTROL) 0.5 MCG capsule Take 0.5 mcg by mouth daily.     . calcium acetate (PHOSLO) 667 MG capsule Take 1 capsule by mouth 3 (three) times daily.    . cholecalciferol (VITAMIN D) 1000 UNITS tablet Take 1,000 Units by mouth daily.    . COMBIVENT RESPIMAT 20-100 MCG/ACT AERS respimat INHALE 2 PUFFS INTO THE LUNGS TWICE A DAY 4 g 4  . ferrous sulfate 325 (65 FE) MG tablet Take 325 mg by mouth daily with breakfast.    . gabapentin (NEURONTIN) 300 MG capsule Take 1 capsule (300 mg total) by mouth at bedtime. 30 capsule 1  . hydrALAZINE (APRESOLINE) 50 MG tablet Take 1 tablet (50 mg total) by mouth 3 (three) times daily. 90 tablet 1  . levETIRAcetam (KEPPRA) 500 MG tablet TAKE 1 TABLET BY MOUTH TWICE DAILY 60 tablet 1  . metoprolol (LOPRESSOR) 50 MG tablet Take 1 tablet (50 mg total) by mouth 2 (two) times daily. 60 tablet 5  . pantoprazole (PROTONIX) 40 MG tablet Take 1 tablet (40 mg total) by mouth daily. 30 tablet 5  . simvastatin (ZOCOR) 10 MG tablet TAKE 1 TABLET BY MOUTH IN THE EVENING FOR CHOLESTEROL 90 tablet 0   No current facility-administered medications for this visit.       Objective:    Temp(Src) 97.6 F (36.4 C) (Oral)  Ht 5\' 7"  (1.702 m)  Wt 211 lb 9.6 oz (95.981 kg)  BMI 33.13 kg/m2  Wt Readings from Last 3 Encounters:  09/17/15 211 lb 9.6 oz (  95.981 kg)  09/14/15 206 lb 8 oz (93.668 kg)  08/13/15 211 lb (95.709 kg)    Gen: NAD, alert, cooperative with exam, NCAT EYES: EOMI, no scleral injection or icterus CV: NRRR, normal S1/S2, no murmur, distal pulses 2+ b/l Resp: CTABL, no wheezes, normal WOB Abd: +BS, soft, NTND. no guarding or organomegaly Ext: No edema, warm Neuro: Alert, strength equal b/l UE and LE, using a cane to walk with MSK: normal muscle bulk     Assessment & Plan:   [name] was seen today for follow-up. Multiple medicines  discontinued over past week given repeat AMS. Son is going to remove the Ambien and benzodiazepines and other medicines that his mother is not supposed to take from her medicine area. He thinks that she is back to her normal self. She has no other complaints today. Blood pressure was elevated. We'll increase her amlodipine. I have reviewed the recent hospital records and labs.  Diagnoses and all orders for this visit:  Essential hypertension Elevated in office today, 160s/70s. Increase amlodipine to 10mg  daily. -     amLODipine (NORVASC) 10 MG tablet; Take 1 tablet (10 mg total) by mouth daily.  Urinary retention a Foley was placed during the hospitalization after she was requiring multiple I&O caths in a 24-hour period. She has a follow-up appointment with urology in a couple weeks. She is having to change the 500 and all back frequently. Is possibly due to postobstructive diuresis. At admission she did have an elevated creatinine. Was just discharged yesterday, will not repeat again today. Wrote prescription for a larger Foley bag.  AMS improved. See above for medication discontinuations. Son and patient in agreement with plan.  Follow up plan: Recheck labs in 1 week.  Assunta Found, MD [facility] 09/17/2015, 3:16 PM

## 2015-09-18 DIAGNOSIS — R339 Retention of urine, unspecified: Secondary | ICD-10-CM | POA: Diagnosis not present

## 2015-09-18 NOTE — Procedures (Signed)
  Laurel A. Merlene Laughter, MD     [email or web address]           HISTORY:  The patient is 64 year old female who presents with recurrent episodes of altered mental status. There is a history of complex partial seizures.   MEDICATIONS: Scheduled Meds: Continuous Infusions: PRN Meds:.  Prior to Admission medications   Medication Sig Start Date End Date Taking? Authorizing Provider  amLODipine (NORVASC) 10 MG tablet Take 1 tablet (10 mg total) by mouth daily. 09/17/15 09/16/16  Eustaquio Maize, MD  aspirin EC 81 MG tablet Take 81 mg by mouth daily.    Historical Provider, MD  calcitRIOL (ROCALTROL) 0.5 MCG capsule Take 0.5 mcg by mouth daily.  04/24/15   Historical Provider, MD  calcium acetate (PHOSLO) 667 MG capsule Take 1 capsule by mouth 3 (three) times daily. 03/12/15   Historical Provider, MD  cholecalciferol (VITAMIN D) 1000 UNITS tablet Take 1,000 Units by mouth daily.    Historical Provider, MD  COMBIVENT RESPIMAT 20-100 MCG/ACT AERS respimat INHALE 2 PUFFS INTO THE LUNGS TWICE A DAY 07/13/15   Claretta Fraise, MD  ferrous sulfate 325 (65 FE) MG tablet Take 325 mg by mouth daily with breakfast.    Historical Provider, MD  gabapentin (NEURONTIN) 300 MG capsule Take 1 capsule (300 mg total) by mouth at bedtime. 05/31/15   Erline Hau, MD  hydrALAZINE (APRESOLINE) 50 MG tablet Take 1 tablet (50 mg total) by mouth 3 (three) times daily. 05/31/15   Erline Hau, MD  levETIRAcetam (KEPPRA) 500 MG tablet TAKE 1 TABLET BY MOUTH TWICE DAILY 07/09/15   Mary-Margaret Hassell Done, FNP  metoprolol (LOPRESSOR) 50 MG tablet Take 1 tablet (50 mg total) by mouth 2 (two) times daily. 03/13/15   Mary-Margaret Hassell Done, FNP  pantoprazole (PROTONIX) 40 MG tablet Take 1 tablet (40 mg total) by mouth daily. 03/13/15   Mary-Margaret Hassell Done, FNP  simvastatin (ZOCOR) 10 MG tablet TAKE 1 TABLET BY MOUTH IN THE EVENING FOR CHOLESTEROL 09/11/15   Chipper Herb, MD       ANALYSIS: A 16 channel recording using standard 10 20 measurements is conducted for 22 minutes. There is a well-formed posterior dominant rhythm of 11 Hz which attenuates with eye opening. There is beta activity observed in the frontal areas. Awake and drowsy activities are observed. Photic stimulation and hyperventilation are not carried out. There is no focal or lateral slowing. There is no epileptiform activity is observed.    IMPRESSION:  This is a normal recording of the awake and drowsy states.       Jaelynne Hockley A. Merlene Laughter, M.D.  Diplomate, Tax adviser of Psychiatry and Neurology ( Neurology). 0

## 2015-09-20 ENCOUNTER — Emergency Department (HOSPITAL_COMMUNITY): Payer: Medicare Other

## 2015-09-20 ENCOUNTER — Encounter (HOSPITAL_COMMUNITY): Payer: Self-pay | Admitting: *Deleted

## 2015-09-20 ENCOUNTER — Emergency Department (HOSPITAL_COMMUNITY)
Admission: EM | Admit: 2015-09-20 | Discharge: 2015-09-21 | Disposition: A | Discharge status: Home / Self Care | Payer: Medicare Other | Attending: Emergency Medicine | Admitting: Emergency Medicine

## 2015-09-20 DIAGNOSIS — E785 Hyperlipidemia, unspecified: Secondary | ICD-10-CM | POA: Diagnosis not present

## 2015-09-20 DIAGNOSIS — N390 Urinary tract infection, site not specified: Secondary | ICD-10-CM | POA: Insufficient documentation

## 2015-09-20 DIAGNOSIS — Z8709 Personal history of other diseases of the respiratory system: Secondary | ICD-10-CM | POA: Diagnosis not present

## 2015-09-20 DIAGNOSIS — Z7982 Long term (current) use of aspirin: Secondary | ICD-10-CM | POA: Diagnosis not present

## 2015-09-20 DIAGNOSIS — Z79899 Other long term (current) drug therapy: Secondary | ICD-10-CM | POA: Insufficient documentation

## 2015-09-20 DIAGNOSIS — F1310 Sedative, hypnotic or anxiolytic abuse, uncomplicated: Secondary | ICD-10-CM | POA: Insufficient documentation

## 2015-09-20 DIAGNOSIS — F05 Delirium due to known physiological condition: Secondary | ICD-10-CM | POA: Insufficient documentation

## 2015-09-20 DIAGNOSIS — E669 Obesity, unspecified: Secondary | ICD-10-CM | POA: Insufficient documentation

## 2015-09-20 DIAGNOSIS — R443 Hallucinations, unspecified: Secondary | ICD-10-CM | POA: Diagnosis not present

## 2015-09-20 DIAGNOSIS — G40909 Epilepsy, unspecified, not intractable, without status epilepticus: Secondary | ICD-10-CM | POA: Diagnosis not present

## 2015-09-20 DIAGNOSIS — F329 Major depressive disorder, single episode, unspecified: Secondary | ICD-10-CM | POA: Insufficient documentation

## 2015-09-20 DIAGNOSIS — R4182 Altered mental status, unspecified: Secondary | ICD-10-CM | POA: Diagnosis not present

## 2015-09-20 DIAGNOSIS — N183 Chronic kidney disease, stage 3 (moderate): Secondary | ICD-10-CM | POA: Insufficient documentation

## 2015-09-20 DIAGNOSIS — M810 Age-related osteoporosis without current pathological fracture: Secondary | ICD-10-CM | POA: Insufficient documentation

## 2015-09-20 DIAGNOSIS — Z87891 Personal history of nicotine dependence: Secondary | ICD-10-CM | POA: Diagnosis not present

## 2015-09-20 DIAGNOSIS — Z049 Encounter for examination and observation for unspecified reason: Secondary | ICD-10-CM

## 2015-09-20 DIAGNOSIS — Z880 Allergy status to penicillin: Secondary | ICD-10-CM | POA: Insufficient documentation

## 2015-09-20 DIAGNOSIS — I129 Hypertensive chronic kidney disease with stage 1 through stage 4 chronic kidney disease, or unspecified chronic kidney disease: Secondary | ICD-10-CM | POA: Diagnosis not present

## 2015-09-20 DIAGNOSIS — F068 Other specified mental disorders due to known physiological condition: Secondary | ICD-10-CM | POA: Diagnosis present

## 2015-09-20 DIAGNOSIS — F0150 Vascular dementia without behavioral disturbance: Secondary | ICD-10-CM | POA: Diagnosis not present

## 2015-09-20 LAB — CBC
HCT: 37.1 % (ref 36.0–46.0)
Hemoglobin: 11.9 g/dL — ABNORMAL LOW (ref 12.0–15.0)
MCH: 31.7 pg (ref 26.0–34.0)
MCHC: 32.1 g/dL (ref 30.0–36.0)
MCV: 98.9 fL (ref 78.0–100.0)
Platelets: 411 10*3/uL — ABNORMAL HIGH (ref 150–400)
RBC: 3.75 MIL/uL — ABNORMAL LOW (ref 3.87–5.11)
RDW: 14 % (ref 11.5–15.5)
WBC: 8.6 10*3/uL (ref 4.0–10.5)

## 2015-09-20 LAB — BASIC METABOLIC PANEL
Anion gap: 10 (ref 5–15)
BUN: 28 mg/dL — ABNORMAL HIGH (ref 6–20)
CO2: 28 mmol/L (ref 22–32)
Calcium: 9.5 mg/dL (ref 8.9–10.3)
Chloride: 105 mmol/L (ref 101–111)
Creatinine, Ser: 1.89 mg/dL — ABNORMAL HIGH (ref 0.44–1.00)
GFR calc Af Amer: 31 mL/min — ABNORMAL LOW (ref >60)
GFR calc non Af Amer: 27 mL/min — ABNORMAL LOW (ref >60)
Glucose, Bld: 111 mg/dL — ABNORMAL HIGH (ref 65–99)
Potassium: 3.4 mmol/L — ABNORMAL LOW (ref 3.5–5.1)
Sodium: 143 mmol/L (ref 135–145)

## 2015-09-20 LAB — ETHANOL: Alcohol, Ethyl (B): <5 mg/dL (ref <5)

## 2015-09-20 LAB — RAPID URINE DRUG SCREEN, HOSP PERFORMED
Amphetamines: NOT DETECTED
Barbiturates: NOT DETECTED
Benzodiazepines: POSITIVE — AB
Cocaine: NOT DETECTED
Opiates: NOT DETECTED
Tetrahydrocannabinol: NOT DETECTED

## 2015-09-20 LAB — URINALYSIS, ROUTINE W REFLEX MICROSCOPIC
Glucose, UA: NEGATIVE mg/dL
Hgb urine dipstick: NEGATIVE
Ketones, ur: NEGATIVE mg/dL
Nitrite: NEGATIVE
Protein, ur: 30 mg/dL — AB
Specific Gravity, Urine: 1.021 (ref 1.005–1.030)
Urobilinogen, UA: 0.2 mg/dL (ref 0.0–1.0)
pH: 5 (ref 5.0–8.0)

## 2015-09-20 LAB — URINE MICROSCOPIC-ADD ON

## 2015-09-20 MED ORDER — CALCITRIOL 0.5 MCG PO CAPS
0.5000 ug | ORAL_CAPSULE | Freq: Every day | ORAL | Status: DC
  Administered 2015-09-20 – 2015-09-21 (×2): 0.5 ug via ORAL
  Filled 2015-09-20 (×3): qty 1

## 2015-09-20 MED ORDER — FLUCONAZOLE 200 MG PO TABS: 200.0000 mg | ORAL_TABLET | Freq: Once | ORAL | Status: DC

## 2015-09-20 MED ORDER — GABAPENTIN 300 MG PO CAPS
300.0000 mg | ORAL_CAPSULE | Freq: Every day | ORAL | Status: DC
Start: 2015-09-20 — End: 2015-09-21
  Administered 2015-09-20: 300 mg via ORAL
  Filled 2015-09-20: qty 1

## 2015-09-20 MED ORDER — ACETAMINOPHEN 500 MG PO TABS
1000.0000 mg | ORAL_TABLET | Freq: Four times a day (QID) | ORAL | Status: DC | PRN
  Administered 2015-09-21: 1000 mg via ORAL
  Filled 2015-09-20: qty 2

## 2015-09-20 MED ORDER — CALCIUM ACETATE (PHOS BINDER) 667 MG PO CAPS
667.0000 mg | ORAL_CAPSULE | Freq: Three times a day (TID) | ORAL | Status: DC
  Administered 2015-09-20 – 2015-09-21 (×2): 667 mg via ORAL
  Filled 2015-09-20 (×5): qty 1

## 2015-09-20 MED ORDER — METOPROLOL TARTRATE 25 MG PO TABS
50.0000 mg | ORAL_TABLET | Freq: Two times a day (BID) | ORAL | Status: DC
  Administered 2015-09-20 – 2015-09-21 (×2): 50 mg via ORAL
  Filled 2015-09-20 (×2): qty 2

## 2015-09-20 MED ORDER — AZITHROMYCIN 250 MG PO TABS: 1000.0000 mg | ORAL_TABLET | Freq: Once | ORAL | Status: DC

## 2015-09-20 MED ORDER — CEFTRIAXONE SODIUM 250 MG IJ SOLR: 250.0000 mg | Freq: Once | INTRAMUSCULAR | Status: DC

## 2015-09-20 MED ORDER — DULOXETINE HCL 60 MG PO CPEP
60.0000 mg | ORAL_CAPSULE | Freq: Every day | ORAL | Status: DC
  Administered 2015-09-21: 60 mg via ORAL
  Filled 2015-09-20 (×3): qty 1

## 2015-09-20 MED ORDER — FERROUS SULFATE 325 (65 FE) MG PO TABS
325.0000 mg | ORAL_TABLET | Freq: Every day | ORAL | Status: DC
  Administered 2015-09-21: 325 mg via ORAL
  Filled 2015-09-20 (×2): qty 1

## 2015-09-20 MED ORDER — VITAMIN D3 25 MCG (1000 UNIT) PO TABS
1000.0000 [IU] | ORAL_TABLET | Freq: Every day | ORAL | Status: DC
  Administered 2015-09-20 – 2015-09-21 (×2): 1000 [IU] via ORAL
  Filled 2015-09-20 (×3): qty 1

## 2015-09-20 MED ORDER — CEPHALEXIN 500 MG PO CAPS
500.0000 mg | ORAL_CAPSULE | Freq: Once | ORAL | Status: AC
  Administered 2015-09-20: 500 mg via ORAL
  Filled 2015-09-20: qty 1

## 2015-09-20 MED ORDER — LEVETIRACETAM 500 MG PO TABS
500.0000 mg | ORAL_TABLET | Freq: Two times a day (BID) | ORAL | Status: DC
  Administered 2015-09-20 – 2015-09-21 (×2): 500 mg via ORAL
  Filled 2015-09-20 (×3): qty 1

## 2015-09-20 MED ORDER — HYDRALAZINE HCL 50 MG PO TABS
50.0000 mg | ORAL_TABLET | Freq: Three times a day (TID) | ORAL | Status: DC
Start: 2015-09-20 — End: 2015-09-21
  Administered 2015-09-20 – 2015-09-21 (×2): 50 mg via ORAL
  Filled 2015-09-20 (×4): qty 1

## 2015-09-20 MED ORDER — SIMVASTATIN 10 MG PO TABS
10.0000 mg | ORAL_TABLET | Freq: Every morning | ORAL | Status: DC
  Administered 2015-09-20: 10 mg via ORAL
  Filled 2015-09-20 (×2): qty 1

## 2015-09-20 MED ORDER — IPRATROPIUM-ALBUTEROL 20-100 MCG/ACT IN AERS
2.0000 | INHALATION_SPRAY | Freq: Two times a day (BID) | RESPIRATORY_TRACT | Status: DC
  Administered 2015-09-20 – 2015-09-21 (×2): 2 via RESPIRATORY_TRACT
  Filled 2015-09-20: qty 4

## 2015-09-20 MED ORDER — ASPIRIN EC 81 MG PO TBEC
81.0000 mg | DELAYED_RELEASE_TABLET | Freq: Every day | ORAL | Status: DC
  Administered 2015-09-20 – 2015-09-21 (×2): 81 mg via ORAL
  Filled 2015-09-20 (×3): qty 1

## 2015-09-20 MED ORDER — CEPHALEXIN 500 MG PO CAPS
500.0000 mg | ORAL_CAPSULE | Freq: Four times a day (QID) | ORAL | Status: DC
  Administered 2015-09-21 (×2): 500 mg via ORAL
  Filled 2015-09-20 (×3): qty 1

## 2015-09-20 MED ORDER — PANTOPRAZOLE SODIUM 40 MG PO TBEC
40.0000 mg | DELAYED_RELEASE_TABLET | Freq: Every day | ORAL | Status: DC
  Administered 2015-09-20 – 2015-09-21 (×2): 40 mg via ORAL
  Filled 2015-09-20 (×2): qty 1

## 2015-09-20 MED ORDER — AMLODIPINE BESYLATE 10 MG PO TABS
10.0000 mg | ORAL_TABLET | Freq: Every day | ORAL | Status: DC
  Administered 2015-09-20 – 2015-09-21 (×2): 10 mg via ORAL
  Filled 2015-09-20 (×3): qty 1

## 2015-09-20 NOTE — ED Notes (Signed)
Called report to Jana Half, RN in [place] for pt to move to RM 26

## 2015-09-20 NOTE — ED Notes (Addendum)
Family reported pt having confusion similar to the way prior to admission at [facility]. Despite of medication changes there has not been any improvement. Son reported that pt confusion did improve some but went back to as before. Pt is A/O x4 with understanding for arrival. Pt reported audible and visual hallucinations. Denies SI/HI. Son reported finding pt on floor last week with unknown as how long she was on the floor and was treated at [facility]. Pt reported having dizziness/lightheadedness and headache but denies nausea, any LOC and visual disturbances.

## 2015-09-20 NOTE — ED Notes (Addendum)
Family at bedside and plans to take pt pocketbook and cellphone home with them but will leave shirt, pants, and shoes items wanded and placed in pt belongings bag.

## 2015-09-20 NOTE — ED Provider Notes (Addendum)
CSN: [id number]     Arrival date & time 09/20/15  1420 History   First MD Initiated Contact with Patient 09/20/15 1506     Chief Complaint  Patient presents with  . Altered Mental Status     (Consider location/radiation/quality/duration/timing/severity/associated sxs/prior Treatment) HPI.... Level V caveat for altered mental status. Most of history obtained from son. He says patient has been seen things and hearing things that weren't there. She was recently admitted to [facility] for similar concerns and several medications were discontinued in an attempt to help the problem. Symptoms have persisted. She presently has a urinary catheter secondary to urinary retention. Recent CT head/MRI brain were negative. She has discontinued her benzodiazepine, Ambien, Zoloft.  Past Medical History  Diagnosis Date  . Seizures ([place])   . Chronic headaches   . Essential hypertension   . Osteoporosis   . Depression   . 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 URI (upper respiratory infection)   . CKD (chronic kidney disease) stage 3, GFR 30-59 ml/min   . Restrictive lung disease   . Hyperlipidemia   . Right leg weakness   . Encephalopathy    Past Surgical History  Procedure Laterality Date  . Breast reduction surgery    . Abdominal hysterectomy    . Lung lobectomy Right     Cyst  . Appendectomy    . Cholecystectomy    . Back surgery  2002   Family History  Problem Relation Age of Onset  . Hypertension Mother   . Heart disease Mother   . Diabetes Mother   . Kidney disease Mother   . Arthritis Mother   . Hypertension Father    Social History  Substance Use Topics  . Smoking status: Former Smoker -- 1.00 packs/day for 30 years    Types: Cigarettes    Quit date: 11/13/2012  . Smokeless tobacco: Never Used  . Alcohol Use: No   OB History    No data available     Review of Systems  Reason unable to perform ROS: psych disorder.      Allergies   Asa; Penicillins; Sulfonamide derivatives; and Tylenol with codeine #3  Home Medications   Prior to Admission medications   Medication Sig Start Date End Date Taking? Authorizing Provider  acetaminophen (TYLENOL) 500 MG tablet Take 1,000 mg by mouth every 6 (six) hours as needed for moderate pain.   Yes Historical Provider, MD  amLODipine (NORVASC) 10 MG tablet Take 1 tablet (10 mg total) by mouth daily. 09/17/15 09/16/16 Yes Eustaquio Maize, MD  aspirin EC 81 MG tablet Take 81 mg by mouth daily.   Yes Historical Provider, MD  calcitRIOL (ROCALTROL) 0.5 MCG capsule Take 0.5 mcg by mouth daily.  04/24/15  Yes Historical Provider, MD  calcium acetate (PHOSLO) 667 MG capsule Take 1 capsule by mouth 3 (three) times daily. 03/12/15  Yes Historical Provider, MD  cholecalciferol (VITAMIN D) 1000 UNITS tablet Take 1,000 Units by mouth daily.   Yes Historical Provider, MD  COMBIVENT RESPIMAT 20-100 MCG/ACT AERS respimat INHALE 2 PUFFS INTO THE LUNGS TWICE A DAY 07/13/15  Yes Claretta Fraise, MD  DULoxetine (CYMBALTA) 60 MG capsule Take 60 mg by mouth daily.   Yes Historical Provider, MD  ferrous sulfate 325 (65 FE) MG tablet Take 325 mg by mouth daily with breakfast.   Yes Historical Provider, MD  gabapentin (NEURONTIN) 300 MG capsule Take 1 capsule (300 mg total) by mouth at bedtime. 05/31/15  Yes Erline Hau, MD  hydrALAZINE (APRESOLINE) 50 MG tablet Take 1 tablet (50 mg total) by mouth 3 (three) times daily. 05/31/15  Yes Erline Hau, MD  levETIRAcetam (KEPPRA) 500 MG tablet TAKE 1 TABLET BY MOUTH TWICE DAILY 07/09/15  Yes Mary-Margaret Hassell Done, FNP  metoprolol (LOPRESSOR) 50 MG tablet Take 1 tablet (50 mg total) by mouth 2 (two) times daily. 03/13/15  Yes Mary-Margaret Hassell Done, FNP  pantoprazole (PROTONIX) 40 MG tablet Take 1 tablet (40 mg total) by mouth daily. 03/13/15  Yes Mary-Margaret Hassell Done, FNP  simvastatin (ZOCOR) 10 MG tablet TAKE 1 TABLET BY MOUTH IN THE EVENING FOR  CHOLESTEROL 09/11/15  Yes Chipper Herb, MD   BP 172/76 mmHg  Pulse 106  Temp(Src) 98.8 F (37.1 C) (Oral)  Resp 20  SpO2 95% Physical Exam  Constitutional: She is oriented to person, place, and time.  obese  HENT:  Head: Normocephalic and atraumatic.  Eyes: Conjunctivae and EOM are normal. Pupils are equal, round, and reactive to light.  Neck: Normal range of motion. Neck supple.  Cardiovascular: Normal rate and regular rhythm.   Pulmonary/Chest: Effort normal and breath sounds normal.  Abdominal: Soft. Bowel sounds are normal.  Musculoskeletal: Normal range of motion.  Neurological: She is alert and oriented to person, place, and time.  Skin: Skin is warm and dry.  Psychiatric:  Flat affect, depressed, states she has visual and auditory hallucinations  Nursing note and vitals reviewed.   ED Course  Procedures (including critical care time) Labs Review Labs Reviewed  CBC - Abnormal; Notable for the following:    RBC 3.75 (*)    Hemoglobin 11.9 (*)    Platelets 411 (*)    All other components within normal limits  BASIC METABOLIC PANEL - Abnormal; Notable for the following:    Potassium 3.4 (*)    Glucose, Bld 111 (*)    BUN 28 (*)    Creatinine, Ser 1.89 (*)    GFR calc non Af Amer 27 (*)    GFR calc Af Amer 31 (*)    All other components within normal limits  URINALYSIS, ROUTINE W REFLEX MICROSCOPIC (NOT AT [facility]) - Abnormal; Notable for the following:    APPearance CLOUDY (*)    Bilirubin Urine SMALL (*)    Protein, ur 30 (*)    Leukocytes, UA LARGE (*)    All other components within normal limits  URINE RAPID DRUG SCREEN, HOSP PERFORMED - Abnormal; Notable for the following:    Benzodiazepines POSITIVE (*)    All other components within normal limits  URINE MICROSCOPIC-ADD ON - Abnormal; Notable for the following:    Bacteria, UA MANY (*)    All other components within normal limits  ETHANOL    Imaging Review Dg Chest 2 View  09/20/2015  CLINICAL DATA:   Hallucinations. EXAM: CHEST  2 VIEW COMPARISON:  09/13/2015 and 05/28/2015 and 02/14/2013 FINDINGS: There is chronic elevation of the right hemidiaphragm with chronic atelectasis at the right base. Heart size and vascularity are normal. No infiltrates or effusions. No acute osseous abnormality. IMPRESSION: No active cardiopulmonary disease. Electronically Signed   By: Lorriane Shire M.D.   On: 09/20/2015 16:55   I have personally reviewed and evaluated these images and lab results as part of my medical decision-making.   EKG Interpretation None      MDM   Final diagnoses:  Altered mental status, unspecified altered mental status type  Hallucinations  UTI (lower urinary tract  infection)    Will get behavioral health consult. Patient has urinary tract infection. Will start Keflex 500 mg q6h for UTI    Nat Christen, MD 09/20/15 [place], MD 09/20/15 [phone]

## 2015-09-20 NOTE — ED Notes (Signed)
Son: Jeneen Rinks [phone]

## 2015-09-20 NOTE — BH Assessment (Addendum)
Tele Assessment Note   [name] is an 64 y.o. female. Patient was seen by TTS Gladstone Rosas LCAS with patient presenting with AVH stating that she has been "hearing and seeing things." Patient was slow to interact with this writer and seemed confused at times although was time place oriented x4. Patient stated that she has been hearing voices of her family and when she goes into the room where the voices are coming from she only sees "a shadow of them." Patient was a poor historian and found it difficult to interact with this writer at times with collateral information obtained from family members who were present and reside with patient. Collateral information obtained from patient's son stated that patient started to responding to internal stimuli approximately two weeks ago reporting patient went into rooms looking for items that were not there and talking to herself. Patient was admitted to [facility] on 10/16 for three days presenting with the same symptoms and was released on 10/19. Patient had previously been on medications to treat for depression although they were discontinued when she was admitted to [facility]. Patient has no SA history or previous MH diagnosis associated with any AVH although patient had been diagnoised with depression. Patient meets inpatient criteria per Theodoro Clock DMP for inpatient [facility] admission.           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 Seizures ([place])   . Chronic headaches   . Essential hypertension   . Osteoporosis   . Depression   . 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 URI (upper respiratory infection)   . CKD (chronic  kidney disease) stage 3, GFR 30-59 ml/min   . Restrictive lung disease   . Hyperlipidemia   . Right leg weakness   . Encephalopathy     Past Surgical History  Procedure Laterality Date  . Breast reduction surgery    . Abdominal hysterectomy    . Lung lobectomy Right     Cyst  . Appendectomy    . Cholecystectomy    . Back surgery  2002    Family History:  Family History  Problem Relation Age of Onset  . Hypertension Mother   . Heart disease Mother   . Diabetes Mother   . Kidney disease Mother   . Arthritis Mother   . Hypertension Father     Social History:  reports that she quit smoking about 2 years ago. Her smoking use included Cigarettes. She has a 30 pack-year smoking history. She has never used smokeless tobacco. She reports that she does not drink alcohol or use illicit drugs.  Additional Social History:  Alcohol / Drug Use Pain Medications: See MAR Prescriptions: See MAR Over the Counter: See MAR History of alcohol / drug use?: No history of alcohol / drug abuse  CIWA: CIWA-Ar BP: 172/76 mmHg Pulse Rate: 106 COWS:  PATIENT STRENGTHS: (choose at least two) Motivation for treatment/growth Supportive family/friends  Allergies:  Allergies  Allergen Reactions  . Asa [Aspirin]     High dose Aspirin causes itching but can take 81mg    . Penicillins Hives    Has patient had a PCN reaction causing immediate rash, facial/tongue/throat swelling, SOB or lightheadedness with hypotension: Yes Has patient had a PCN reaction causing severe rash involving mucus membranes or skin necrosis: Yes-rash/hives Has patient had a PCN reaction that required hospitalization No Has patient had a PCN reaction occurring within the last 10 years: Yes If all of the above answers are "NO", then may proceed with Cephalosporin use.   . Sulfonamide Derivatives Hives  . Tylenol With Codeine #3 [Acetaminophen-Codeine] Itching    Home Medications:  (Not in a hospital  admission)  OB/GYN Status:  No LMP recorded. Patient has had a hysterectomy.  General Assessment Data Location of Assessment: [facility] TTS Assessment: In system Is this a Tele or Face-to-Face Assessment?: Face-to-Face Is this an Initial Assessment or a Re-assessment for this encounter?: Initial Assessment Marital status: Divorced [place] name: NA Is patient pregnant?: No Pregnancy Status: No Living Arrangements: Children Can pt return to current living arrangement?: Yes Admission Status: Voluntary Is patient capable of signing voluntary admission?: Yes Referral Source: Self/Family/Friend Insurance type: [place] Screening Exam ([place]) Medical Exam completed: Yes  [place] Arrangements: Children Name of Psychiatrist: NA Name of Therapist: NA  Education Status Is patient currently in school?: No Current Grade: NA Highest grade of school patient has completed: 5 Name of school: [employer] Greek person: Kenyon Ana  Risk to self with the past 6 months Suicidal Ideation: No Has patient been a risk to self within the past 6 months prior to admission? : No Suicidal Intent: No Has patient had any suicidal intent within the past 6 months prior to admission? : No Is patient at risk for suicide?: No Suicidal Plan?: No Has patient had any suicidal plan within the past 6 months prior to admission? : No Access to Means: No What has been your use of drugs/alcohol within the last 12 months?: None Previous Attempts/Gestures: No How many times?: 0 Other Self Harm Risks: None Triggers for Past Attempts: None known Intentional Self Injurious Behavior: None Family Suicide History: No Recent stressful life event(s): Recent negative physical changes Persecutory voices/beliefs?: No Depression: Yes Depression Symptoms: Despondent, Insomnia, Loss of interest in usual pleasures Substance abuse history and/or treatment for substance abuse?:  No Suicide prevention information given to non-admitted patients: Not applicable  Risk to Others within the past 6 months Homicidal Ideation: No Does patient have any lifetime risk of violence toward others beyond the six months prior to admission? : No Thoughts of Harm to Others: No Current Homicidal Intent: No Current Homicidal Plan: No Access to Homicidal Means: No Identified Victim: NA History of harm to others?: No Assessment of Violence: None Noted Violent Behavior Description: NA Does patient have access to weapons?: No Criminal Charges Pending?: No Does patient have a court date: No Is patient on probation?: No  Psychosis Hallucinations: Auditory, Visual Delusions: None noted  Mental Status Report Appearance/Hygiene: In scrubs Eye Contact: Poor Motor Activity: Tremors Speech: Slow Level of Consciousness: Quiet/awake Mood: Depressed Affect: Depressed Anxiety Level: Moderate Thought Processes: Coherent, Relevant Judgement: Unimpaired Orientation: Person, Place, Time Obsessive Compulsive Thoughts/Behaviors: None  Cognitive Functioning Concentration: Decreased Memory: Recent Impaired IQ: Average Insight: Fair Impulse Control: Good Appetite: Fair Weight Loss:  0 Weight Gain: 0 Sleep: No Change Total Hours of Sleep: 6 Vegetative Symptoms: None  ADLScreening [facility] Assessment Services) Patient's cognitive ability adequate to safely complete daily activities?: Yes Patient able to express need for assistance with ADLs?: Yes  Prior Inpatient Therapy Prior Inpatient Therapy: No Prior Therapy Dates: NA Prior Therapy Facilty/Provider(s): [facility] Reason for Treatment: AVH  Prior Outpatient Therapy Prior Outpatient Therapy: No Prior Therapy Dates: NA Prior Therapy Facilty/Provider(s): [facility] Reason for Treatment: AVH Does patient have an ACCT team?: No Does patient have Intensive In-House Services?  : No Does patient have [facility] services? : No Does  patient have P4CC services?: No  ADL Screening (condition at time of admission) Patient's cognitive ability adequate to safely complete daily activities?: Yes Is the patient deaf or have difficulty hearing?: No Does the patient have difficulty seeing, even when wearing glasses/contacts?: No Does the patient have difficulty concentrating, remembering, or making decisions?: Yes Patient able to express need for assistance with ADLs?: Yes Does the patient have difficulty dressing or bathing?: Yes Communication: Independent Dressing (OT): Independent Is this a change from baseline?: Pre-admission baseline Grooming: Independent Is this a change from baseline?: Pre-admission baseline Feeding: Independent Is this a change from baseline?: Pre-admission baseline Bathing: Independent Is this a change from baseline?: Pre-admission baseline Toileting: Independent Is this a change from baseline?: Pre-admission baseline In/Out Bed: Independent Is this a change from baseline?: Pre-admission baseline Walks in Home: Independent Does the patient have difficulty walking or climbing stairs?: No Weakness of Legs: None Weakness of Arms/Hands: None     Therapy Consults (therapy consults require a physician order) PT Evaluation Needed: No OT Evalulation Needed: No SLP Evaluation Needed: No Abuse/Neglect Assessment (Assessment to be complete while patient is alone) Physical Abuse: Denies Verbal Abuse: Denies Sexual Abuse: Denies Exploitation of patient/patient's resources: Denies Self-Neglect: Denies Values / Beliefs Cultural Requests During Hospitalization: None Spiritual Requests During Hospitalization: None Consults Spiritual Care Consult Needed: No Social Work Consult Needed: No Regulatory affairs officer (For Healthcare) Does patient have an advance directive?: No Would patient like information on creating an advanced directive?: Yes Higher education careers adviser given    Additional Information 1:1  In Past 12 Months?: No CIRT Risk: No Elopement Risk: No Does patient have medical clearance?: Yes     Disposition: Patient meets inpatient criteria per Theodoro Clock DMP for inpatient [facility] admission.           Disposition Initial Assessment Completed for this Encounter: Yes Disposition of Patient: Inpatient treatment program Type of inpatient treatment program: Adult  Mamie Nick 09/20/2015 4:50 PM

## 2015-09-20 NOTE — ED Notes (Signed)
Security at bedside to wand pt. 

## 2015-09-20 NOTE — ED Notes (Signed)
Awake. Verbally responsive. A/O x4. Resp even and unlabored. No audible adventitious breath sounds noted. ABC's intact. IV saline lock patent and intact. 

## 2015-09-20 NOTE — ED Notes (Signed)
Patient presents from home where she lives with her son.  Patient was d/c'd from [facility] on 10/19 following admission for confusion.  Patient's son states the providers at [facility] believe the confusion she experienced last week was related to Ativan, Ambien and another medication.  Son states patient has not been on those medications since returning home.  Son states several of patient's home meds were discontinued and she has not taken them since returning home.  Patient is oriented to person, place and time.  Patient states she is seeing things that are not there (horses, someone standing on their head) and appears to be speaking to someone who is not present in triage.

## 2015-09-21 DIAGNOSIS — R4182 Altered mental status, unspecified: Secondary | ICD-10-CM | POA: Diagnosis not present

## 2015-09-21 DIAGNOSIS — R443 Hallucinations, unspecified: Secondary | ICD-10-CM | POA: Insufficient documentation

## 2015-09-21 DIAGNOSIS — F0150 Vascular dementia without behavioral disturbance: Secondary | ICD-10-CM | POA: Diagnosis not present

## 2015-09-21 DIAGNOSIS — N390 Urinary tract infection, site not specified: Secondary | ICD-10-CM | POA: Diagnosis present

## 2015-09-21 DIAGNOSIS — F068 Other specified mental disorders due to known physiological condition: Secondary | ICD-10-CM | POA: Diagnosis present

## 2015-09-21 NOTE — Consult Note (Signed)
[facility] Consult   Reason for Consult:  Psychosis Referring Physician:  EDP Patient Identification: [name] MRN:  [id number] Principal Diagnosis: Acute psychosis associated with endocrine, metabolic, or cerebrovascular disorder Diagnosis:   Patient Active Problem List   Diagnosis Date Noted  . Acute psychosis associated with endocrine, metabolic, or cerebrovascular disorder [F06.8, F01.50] 09/21/2015    Priority: High  . Urinary tract infection [N39.0] 09/21/2015    Priority: High  . Delirium [R41.0] 09/13/2015  . Malnutrition of moderate degree ([place]) [E44.0] 05/29/2015  . Acute encephalopathy [G93.40] 05/28/2015  . Acute renal failure superimposed on stage 3 chronic kidney disease ([place]) [N17.9, N18.3] 05/28/2015  . Protein-calorie malnutrition, severe ([place]) [E43] 04/24/2015  . Vomiting [R11.10] 04/11/2015  . Metabolic acidosis [M08.6] 04/11/2015  . Diarrhea [R19.7] 04/11/2015  . AKI (acute kidney injury) ([employer]) [N17.9] 04/11/2015  . Nausea vomiting and diarrhea [R11.2, R19.7]   . Gastroesophageal reflux disease without esophagitis [K21.9] 03/13/2015  . Neuropathy ([place]) [G62.9] 03/13/2015  . 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 Insomnia [G47.00] 03/25/2013  . Depression [F32.9] 03/25/2013  . Seizures ([place]) [R56.9] 03/25/2013  . Hyperlipidemia [E78.5] 03/25/2013  . MGUS (monoclonal gammopathy of unknown significance) [D47.2] 03/25/2013  . Essential hypertension [I10] 04/21/2010  . DOE (dyspnea on exertion) [R06.09] 04/21/2010  . CHEST PAIN-UNSPECIFIED [R07.9] 04/21/2010    Total Time spent with patient: 45 minutes  Subjective:   [name] is a 64 y.o. female patient does not warrant admission.  HPI:  64 yo female presented to the ED with hallucinations, auditory and visual; history of depression.  She was in the hospital last week and her benzodiazepines and antidepressant were discontinued along with some of her other medications.   Her hallucinations restarted after discharge and increased.  She came to the ED and labs revealed a UTI, Keflex started.  Today, she reports no hallucinations and explains her hallucinations.  Denies suicidal/homicidal ideations,hallucinations, and alcohol/drug abuse.  She feels stable for discharge.  Past Psychiatric History: depression  Risk to Self: Suicidal Ideation: No Suicidal Intent: No Is patient at risk for suicide?: No Suicidal Plan?: No Access to Means: No What has been your use of drugs/alcohol within the last 12 months?: None How many times?: 0 Other Self Harm Risks: None Triggers for Past Attempts: None known Intentional Self Injurious Behavior: None Risk to Others: Homicidal Ideation: No Thoughts of Harm to Others: No Current Homicidal Intent: No Current Homicidal Plan: No Access to Homicidal Means: No Identified Victim: NA History of harm to others?: No Assessment of Violence: None Noted Violent Behavior Description: NA Does patient have access to weapons?: No Criminal Charges Pending?: No Does patient have a court date: No Prior Inpatient Therapy: Prior Inpatient Therapy: No Prior Therapy Dates: NA Prior Therapy Facilty/Provider(s): [facility] Reason for Treatment: [facility] Prior Outpatient Therapy: Prior Outpatient Therapy: No Prior Therapy Dates: NA Prior Therapy Facilty/Provider(s): [facility] Reason for Treatment: [facility] Does patient have an ACCT team?: No Does patient have Intensive In-House Services?  : No Does patient have [facility] services? : No Does patient have P4CC services?: No  Past Medical History:  Past Medical History  Diagnosis Date  . Seizures ([place])   . Chronic headaches   . Essential hypertension   . Osteoporosis   . Depression   . 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 URI (upper respiratory infection)   . CKD (chronic kidney disease) stage 3, GFR 30-59 ml/min   . Restrictive lung disease   .  Hyperlipidemia   .  Right leg weakness   . Encephalopathy     Past Surgical History  Procedure Laterality Date  . Breast reduction surgery    . Abdominal hysterectomy    . Lung lobectomy Right     Cyst  . Appendectomy    . Cholecystectomy    . Back surgery  2002   Family History:  Family History  Problem Relation Age of Onset  . Hypertension Mother   . Heart disease Mother   . Diabetes Mother   . Kidney disease Mother   . Arthritis Mother   . Hypertension Father    Family Psychiatric  History: None Social History:  History  Alcohol Use No     History  Drug Use No    Social History   Social History  . Marital Status: Widowed    Spouse Name: N/A  . Number of Children: N/A  . Years of Education: N/A   Occupational History  . Unemployed    Social History Main Topics  . Smoking status: Former Smoker -- 1.00 packs/day for 30 years    Types: Cigarettes    Quit date: 11/13/2012  . Smokeless tobacco: Never Used  . Alcohol Use: No  . Drug Use: No  . Sexual Activity: Not Asked   Other Topics Concern  . None   Social History Narrative   Additional Social History:    Pain Medications: See MAR Prescriptions: See MAR Over the Counter: See MAR History of alcohol / drug use?: No history of alcohol / drug abuse                     Allergies:   Allergies  Allergen Reactions  . Asa [Aspirin]     High dose Aspirin causes itching but can take 25m   . Penicillins Hives    Has patient had a PCN reaction causing immediate rash, facial/tongue/throat swelling, SOB or lightheadedness with hypotension: Yes Has patient had a PCN reaction causing severe rash involving mucus membranes or skin necrosis: Yes-rash/hives Has patient had a PCN reaction that required hospitalization No Has patient had a PCN reaction occurring within the last 10 years: Yes If all of the above answers are "NO", then may proceed with Cephalosporin use.   . Sulfonamide Derivatives Hives  . Tylenol With  Codeine #3 [Acetaminophen-Codeine] Itching    Labs:  Results for orders placed or performed during the hospital encounter of 09/20/15 (from the past 48 hour(s))  CBC     Status: Abnormal   Collection Time: 09/20/15  3:16 PM  Result Value Ref Range   WBC 8.6 4.0 - 10.5 K/uL   RBC 3.75 (L) 3.87 - 5.11 MIL/uL   Hemoglobin 11.9 (L) 12.0 - 15.0 g/dL   HCT 37.1 36.0 - 46.0 %   MCV 98.9 78.0 - 100.0 fL   MCH 31.7 26.0 - 34.0 pg   MCHC 32.1 30.0 - 36.0 g/dL   RDW 14.0 11.5 - 15.5 %   Platelets 411 (H) 150 - 400 K/uL  Basic metabolic panel     Status: Abnormal   Collection Time: 09/20/15  3:16 PM  Result Value Ref Range   Sodium 143 135 - 145 mmol/L   Potassium 3.4 (L) 3.5 - 5.1 mmol/L   Chloride 105 101 - 111 mmol/L   CO2 28 22 - 32 mmol/L   Glucose, Bld 111 (H) 65 - 99 mg/dL   BUN 28 (H) 6 - 20 mg/dL  Creatinine, Ser 1.89 (H) 0.44 - 1.00 mg/dL   Calcium 9.5 8.9 - 10.3 mg/dL   GFR calc non Af Amer 27 (L) >60 mL/min   GFR calc Af Amer 31 (L) >60 mL/min    Comment: (NOTE) The eGFR has been calculated using the CKD EPI equation. This calculation has not been validated in all clinical situations. eGFR's persistently <60 mL/min signify possible Chronic Kidney Disease.    Anion gap 10 5 - 15  Ethanol     Status: None   Collection Time: 09/20/15  3:49 PM  Result Value Ref Range   Alcohol, Ethyl (B) <5 <5 mg/dL    Comment:        LOWEST DETECTABLE LIMIT FOR SERUM ALCOHOL IS 5 mg/dL FOR MEDICAL PURPOSES ONLY   Urinalysis, Routine w reflex microscopic (not at [facility])     Status: Abnormal   Collection Time: 09/20/15  4:20 PM  Result Value Ref Range   Color, Urine YELLOW YELLOW   APPearance CLOUDY (A) CLEAR   Specific Gravity, Urine 1.021 1.005 - 1.030   pH 5.0 5.0 - 8.0   Glucose, UA NEGATIVE NEGATIVE mg/dL   Hgb urine dipstick NEGATIVE NEGATIVE   Bilirubin Urine SMALL (A) NEGATIVE   Ketones, ur NEGATIVE NEGATIVE mg/dL   Protein, ur 30 (A) NEGATIVE mg/dL   Urobilinogen, UA 0.2  0.0 - 1.0 mg/dL   Nitrite NEGATIVE NEGATIVE   Leukocytes, UA LARGE (A) NEGATIVE  Urine rapid drug screen (hosp performed)     Status: Abnormal   Collection Time: 09/20/15  4:20 PM  Result Value Ref Range   Opiates NONE DETECTED NONE DETECTED   Cocaine NONE DETECTED NONE DETECTED   Benzodiazepines POSITIVE (A) NONE DETECTED   Amphetamines NONE DETECTED NONE DETECTED   Tetrahydrocannabinol NONE DETECTED NONE DETECTED   Barbiturates NONE DETECTED NONE DETECTED    Comment:        DRUG SCREEN FOR MEDICAL PURPOSES ONLY.  IF CONFIRMATION IS NEEDED FOR ANY PURPOSE, NOTIFY LAB WITHIN 5 DAYS.        LOWEST DETECTABLE LIMITS FOR URINE DRUG SCREEN Drug Class       Cutoff (ng/mL) Amphetamine      1000 Barbiturate      200 Benzodiazepine   335 Tricyclics       456 Opiates          300 Cocaine          300 THC              50   Urine microscopic-add on     Status: Abnormal   Collection Time: 09/20/15  4:20 PM  Result Value Ref Range   Squamous Epithelial / LPF RARE RARE   WBC, UA TOO NUMEROUS TO COUNT <3 WBC/hpf   RBC / HPF 0-2 <3 RBC/hpf   Bacteria, UA MANY (A) RARE    Current Facility-Administered Medications  Medication Dose Route Frequency Provider Last Rate Last Dose  . acetaminophen (TYLENOL) tablet 1,000 mg  1,000 mg Oral Q6H PRN Nat Christen, MD   1,000 mg at 09/21/15 0009  . amLODipine (NORVASC) tablet 10 mg  10 mg Oral Daily Nat Christen, MD   10 mg at 09/21/15 1006  . aspirin EC tablet 81 mg  81 mg Oral Daily Nat Christen, MD   81 mg at 09/21/15 1010  . calcitRIOL (ROCALTROL) capsule 0.5 mcg  0.5 mcg Oral Daily Nat Christen, MD   0.5 mcg at 09/21/15 1010  . calcium acetate (PHOSLO)  capsule 667 mg  667 mg Oral TID Nat Christen, MD   667 mg at 09/21/15 1010  . cephALEXin (KEFLEX) capsule 500 mg  500 mg Oral 4 times per day Nat Christen, MD   500 mg at 09/21/15 0521  . cholecalciferol (VITAMIN D) tablet 1,000 Units  1,000 Units Oral Daily Nat Christen, MD   1,000 Units at 09/21/15 1010  .  DULoxetine (CYMBALTA) DR capsule 60 mg  60 mg Oral Daily Nat Christen, MD   60 mg at 09/21/15 1010  . ferrous sulfate tablet 325 mg  325 mg Oral Q breakfast Nat Christen, MD   325 mg at 09/21/15 0858  . gabapentin (NEURONTIN) capsule 300 mg  300 mg Oral QHS Nat Christen, MD   300 mg at 09/20/15 2153  . hydrALAZINE (APRESOLINE) tablet 50 mg  50 mg Oral TID Nat Christen, MD   50 mg at 09/21/15 1006  . Ipratropium-Albuterol (COMBIVENT) respimat 2 puff  2 puff Inhalation BID Nat Christen, MD   2 puff at 09/21/15 [id number]  . levETIRAcetam (KEPPRA) tablet 500 mg  500 mg Oral BID Nat Christen, MD   500 mg at 09/21/15 1010  . metoprolol tartrate (LOPRESSOR) tablet 50 mg  50 mg Oral BID Nat Christen, MD   50 mg at 09/21/15 1010  . pantoprazole (PROTONIX) EC tablet 40 mg  40 mg Oral Daily Nat Christen, MD   40 mg at 09/21/15 1010  . simvastatin (ZOCOR) tablet 10 mg  10 mg Oral q morning - 10a Nat Christen, MD   10 mg at 09/20/15 2153   Current Outpatient Prescriptions  Medication Sig Dispense Refill  . acetaminophen (TYLENOL) 500 MG tablet Take 1,000 mg by mouth every 6 (six) hours as needed for moderate pain.    [name] amLODipine (NORVASC) 10 MG tablet Take 1 tablet (10 mg total) by mouth daily. 30 tablet 5  . aspirin EC 81 MG tablet Take 81 mg by mouth daily.    . calcitRIOL (ROCALTROL) 0.5 MCG capsule Take 0.5 mcg by mouth daily.     . calcium acetate (PHOSLO) 667 MG capsule Take 1 capsule by mouth 3 (three) times daily.    . cholecalciferol (VITAMIN D) 1000 UNITS tablet Take 1,000 Units by mouth daily.    . COMBIVENT RESPIMAT 20-100 MCG/ACT AERS respimat INHALE 2 PUFFS INTO THE LUNGS TWICE A DAY 4 g 4  . DULoxetine (CYMBALTA) 60 MG capsule Take 60 mg by mouth daily.    . ferrous sulfate 325 (65 FE) MG tablet Take 325 mg by mouth daily with breakfast.    . gabapentin (NEURONTIN) 300 MG capsule Take 1 capsule (300 mg total) by mouth at bedtime. 30 capsule 1  . hydrALAZINE (APRESOLINE) 50 MG tablet Take 1 tablet (50 mg total) by  mouth 3 (three) times daily. 90 tablet 1  . levETIRAcetam (KEPPRA) 500 MG tablet TAKE 1 TABLET BY MOUTH TWICE DAILY 60 tablet 1  . metoprolol (LOPRESSOR) 50 MG tablet Take 1 tablet (50 mg total) by mouth 2 (two) times daily. 60 tablet 5  . pantoprazole (PROTONIX) 40 MG tablet Take 1 tablet (40 mg total) by mouth daily. 30 tablet 5  . simvastatin (ZOCOR) 10 MG tablet TAKE 1 TABLET BY MOUTH IN THE EVENING FOR CHOLESTEROL 90 tablet 0    Musculoskeletal: Strength & Muscle Tone: within normal limits Gait & Station: normal Patient leans: N/A  Psychiatric Specialty Exam: Review of Systems  Constitutional: Negative.   HENT: Negative.   Eyes:  Negative.   Respiratory: Negative.   Cardiovascular: Negative.   Gastrointestinal: Negative.   Genitourinary: Negative.   Musculoskeletal: Negative.   Skin: Negative.   Neurological: Negative.   Endo/Heme/Allergies: Negative.   Psychiatric/Behavioral:       Negative    Blood pressure 158/85, pulse 72, temperature 98.3 F (36.8 C), temperature source Oral, resp. rate 16, SpO2 97 %.There is no weight on file to calculate BMI.  General Appearance: Casual  Eye Contact::  Good  Speech:  Normal Rate  Volume:  Normal  Mood:  Euthymic  Affect:  Congruent  Thought Process:  Coherent  Orientation:  Full (Time, Place, and Person)  Thought Content:  WDL  Suicidal Thoughts:  No  Homicidal Thoughts:  No  Memory:  Immediate;   Good Recent;   Good Remote;   Good  Judgement:  Good  Insight:  Good  Psychomotor Activity:  Normal  Concentration:  Good  Recall:  Good  Fund of Knowledge:Good  Language: Good  Akathisia:  No  Handed:  Right  AIMS (if indicated):     Assets:  Communication Skills Desire for Improvement Housing Leisure Time Physical Health Resilience Social Support  ADL's:  Intact  Cognition: WNL  Sleep:      Treatment Plan Summary: Daily contact with patient to assess and evaluate symptoms and progress in treatment, Medication  management and Plan Psychosis, 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    Waylan Boga, [place] 09/21/2015 10:47 AM Patient seen face-to-face for psychiatric evaluation, chart reviewed and case discussed with the physician extender and developed treatment plan. Reviewed the information documented and agree with the treatment plan. Corena Pilgrim, MD

## 2015-09-21 NOTE — [facility] Suicide Risk Assessment (Signed)
Suicide Risk Assessment  Discharge Assessment   [facility] Discharge Suicide Risk Assessment   Demographic Factors:  NA  Total Time spent with patient: 45 minutes   Musculoskeletal: Strength & Muscle Tone: within normal limits Gait & Station: normal Patient leans: N/A  Psychiatric Specialty Exam: Review of Systems  Constitutional: Negative.   HENT: Negative.   Eyes: Negative.   Respiratory: Negative.   Cardiovascular: Negative.   Gastrointestinal: Negative.   Genitourinary: Negative.   Musculoskeletal: Negative.   Skin: Negative.   Neurological: Negative.   Endo/Heme/Allergies: Negative.   Psychiatric/Behavioral:       Negative    Blood pressure 158/85, pulse 72, temperature 98.3 F (36.8 C), temperature source Oral, resp. rate 16, SpO2 97 %.There is no weight on file to calculate BMI.  General Appearance: Casual  Eye Contact::  Good  Speech:  Normal Rate  Volume:  Normal  Mood:  Euthymic  Affect:  Congruent  Thought Process:  Coherent  Orientation:  Full (Time, Place, and Person)  Thought Content:  WDL  Suicidal Thoughts:  No  Homicidal Thoughts:  No  Memory:  Immediate;   Good Recent;   Good Remote;   Good  Judgement:  Good  Insight:  Good  Psychomotor Activity:  Normal  Concentration:  Good  Recall:  Good  Fund of Knowledge:Good  Language: Good  Akathisia:  No  Handed:  Right  AIMS (if indicated):     Assets:  Communication Skills Desire for Improvement Housing Leisure Time Physical Health Resilience Social Support  ADL's:  Intact  Cognition: WNL  Sleep:         Has this patient used any form of tobacco in the last 30 days? (Cigarettes, Smokeless Tobacco, Cigars, and/or Pipes) No  Mental Status Per Nursing Assessment::   On Admission:   Hallucinations  Current Mental Status by Physician: NA  Loss Factors: NA  Historical Factors: NA  Risk Reduction Factors:   Sense of responsibility to family, Religious beliefs about death, Living with  another person, especially a relative, Positive social support, Positive therapeutic relationship and Positive coping skills or problem solving skills  Continued Clinical Symptoms:  None  Cognitive Features That Contribute To Risk:  None    Suicide Risk:  Minimal: No identifiable suicidal ideation.  Patients presenting with no risk factors but with morbid ruminations; may be classified as minimal risk based on the severity of the depressive symptoms  Principal Problem: Acute psychosis associated with endocrine, metabolic, or cerebrovascular disorder Discharge Diagnoses:  Patient Active Problem List   Diagnosis Date Noted  . Acute psychosis associated with endocrine, metabolic, or cerebrovascular disorder [F06.8, F01.50] 09/21/2015    Priority: High  . Urinary tract infection [N39.0] 09/21/2015    Priority: High  . Delirium [R41.0] 09/13/2015  . Malnutrition of moderate degree ([place]) [E44.0] 05/29/2015  . Acute encephalopathy [G93.40] 05/28/2015  . Acute renal failure superimposed on stage 3 chronic kidney disease ([place]) [N17.9, N18.3] 05/28/2015  . Protein-calorie malnutrition, severe ([place]) [E43] 04/24/2015  . Vomiting [R11.10] 04/11/2015  . Metabolic acidosis 99991111 04/11/2015  . Diarrhea [R19.7] 04/11/2015  . AKI (acute kidney injury) ([place]) [N17.9] 04/11/2015  . Nausea vomiting and diarrhea [R11.2, R19.7]   . Gastroesophageal reflux disease without esophagitis [K21.9] 03/13/2015  . Neuropathy ([place]) [G62.9] 03/13/2015  . 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 Insomnia [G47.00] 03/25/2013  . Depression [F32.9] 03/25/2013  . Seizures ([place]) [R56.9] 03/25/2013  . Hyperlipidemia [E78.5] 03/25/2013  . MGUS (monoclonal gammopathy of unknown significance) [D47.2] 03/25/2013  .  Essential hypertension [I10] 04/21/2010  . DOE (dyspnea on exertion) [R06.09] 04/21/2010  . CHEST PAIN-UNSPECIFIED [R07.9] 04/21/2010      Plan Of Care/Follow-up recommendations:  Activity:  as  tolerated Diet:  heart healthy diet  Is patient on multiple antipsychotic therapies at discharge:  No   Has Patient had three or more failed trials of antipsychotic monotherapy by history:  No  Recommended Plan for Multiple Antipsychotic Therapies: NA    Trysten Berti, PMH-NP 09/21/2015, 11:07 AM

## 2015-09-21 NOTE — Progress Notes (Signed)
Pt has not been asleep tonight.  She has been having hallucinations frequently. She has taken in 720cc and put out 200 in foley which I changed to bag to hang on bed at HS. Pt quietly reacting to internal stimuli in her room. [name]

## 2015-09-21 NOTE — ED Notes (Signed)
Son present to transport patient home, pt belongings retrieved from locker 26, pt wishes to wear scrubs upon d/c, d/c instructions reviewed, pt and son verbalize understanding of same. Pt w/c out upon d/c. Son signed for d/c instructions.

## 2015-09-21 NOTE — ED Notes (Signed)
Pt made a phone call to her Aunt.

## 2015-09-21 NOTE — ED Notes (Signed)
Pt appears to be responding to internal stimuli at times, states "I'm just talking to my imaginary friend".

## 2015-09-21 NOTE — ED Notes (Signed)
Bedside report received from previous RN, Rip Harbour. Pt is calm, sitting on bedside.

## 2015-09-21 NOTE — ED Notes (Addendum)
Pt sitting up in recliner, respirations even and unlabored, skin warm and dry, in NAD, updated patient that son was attempting to find transportation home, pt verbalizes understanding of same, denies further needs, will continue to monitor.

## 2015-09-21 NOTE — ED Notes (Signed)
Patient's son called to inform he and other son will be on their way to pick up patient. Patient updated of same, verbalizes understanding.

## 2015-09-22 ENCOUNTER — Encounter: Payer: Self-pay | Admitting: Nurse Practitioner

## 2015-09-22 ENCOUNTER — Ambulatory Visit (INDEPENDENT_AMBULATORY_CARE_PROVIDER_SITE_OTHER): Payer: Medicare Other | Admitting: Nurse Practitioner

## 2015-09-22 VITALS — BP 116/73 | HR 81 | Temp 97.0°F | Ht 67.0 in | Wt 204.0 lb

## 2015-09-22 DIAGNOSIS — Z978 Presence of other specified devices: Secondary | ICD-10-CM

## 2015-09-22 DIAGNOSIS — F0391 Unspecified dementia with behavioral disturbance: Secondary | ICD-10-CM | POA: Diagnosis not present

## 2015-09-22 DIAGNOSIS — R443 Hallucinations, unspecified: Secondary | ICD-10-CM | POA: Diagnosis not present

## 2015-09-22 DIAGNOSIS — N3000 Acute cystitis without hematuria: Secondary | ICD-10-CM

## 2015-09-22 DIAGNOSIS — Z9289 Personal history of other medical treatment: Secondary | ICD-10-CM

## 2015-09-22 DIAGNOSIS — Z960 Presence of urogenital implants: Secondary | ICD-10-CM

## 2015-09-22 MED ORDER — DONEPEZIL HCL 10 MG PO TABS: 10.0000 mg | ORAL_TABLET | Freq: Every day | ORAL | Status: DC

## 2015-09-22 MED ORDER — CEPHALEXIN 500 MG PO CAPS: 500.0000 mg | ORAL_CAPSULE | Freq: Three times a day (TID) | ORAL | Status: DC

## 2015-09-22 NOTE — Progress Notes (Signed)
   Subjective:    Patient ID: [name], female    DOB: 1951/01/27, 64 y.o.   MRN: [id number]  HPI Patient wenert to ER with Hallucinations 2 days ago- was dx with UTI and was given keflex in ER but not given rx to further medication. She does have an in dwelling catheter. Has urology appointment late November but would like it moved up. She is still talking to people that are not there and confused about where she is at times. Had psych evaluation yesterday at [facility] and they did  Not feel she was a threat. Sent home and told family to watch her.    Review of Systems  Constitutional: Negative.   HENT: Negative.   Respiratory: Negative.   Cardiovascular: Negative.   Genitourinary: Negative.   Neurological: Negative.   Psychiatric/Behavioral: Positive for hallucinations.  All other systems reviewed and are negative.      Objective:   Physical Exam  Constitutional: She is oriented to person, place, and time. She appears well-developed and well-nourished.  Cardiovascular: Normal rate, regular rhythm and normal heart sounds.   Pulmonary/Chest: Effort normal and breath sounds normal.  Neurological: She is alert and oriented to person, place, and time. No cranial nerve deficit.  Skin: Skin is warm.  Psychiatric:  Patient very quiet, does answer some questions appropriately. Laughing inappropriately at times. Denies hallucinations today    BP 116/73 mmHg  Pulse 81  Temp(Src) 97 F (36.1 C) (Oral)  Ht 5\' 7"  (1.702 m)  Wt 204 lb (92.534 kg)  BMI 31.94 kg/m2       Assessment & Plan:  1. Hallucinations None today  2. Acute cystitis without hematuria Force fluids Move referral  To urologist up- son will call with urologist name - cephALEXin (KEFLEX) 500 MG capsule; Take 1 capsule (500 mg total) by mouth 3 (three) times daily.  Dispense: 30 capsule; Refill: 0  3. Dementia, with behavioral disturbance Try not to leave patient alone RTO in 1 month and let me see how  she is doing- call if need to - donepezil (ARICEPT) 10 MG tablet; Take 1 tablet (10 mg total) by mouth at bedtime.  Dispense: 30 tablet; Refill: [place], FNP

## 2015-09-22 NOTE — Patient Instructions (Signed)

## 2015-09-23 DIAGNOSIS — R339 Retention of urine, unspecified: Secondary | ICD-10-CM | POA: Diagnosis not present

## 2015-09-24 ENCOUNTER — Institutional Professional Consult (permissible substitution): Payer: Medicare Other | Admitting: Pulmonary Disease

## 2015-09-24 NOTE — Addendum Note (Signed)
Addended by: Chevis Pretty on: 09/24/2015 10:30 AM   Modules accepted: Orders, SmartSet

## 2015-09-25 ENCOUNTER — Telehealth: Payer: Self-pay | Admitting: Nurse Practitioner

## 2015-09-25 ENCOUNTER — Other Ambulatory Visit (HOSPITAL_COMMUNITY): Payer: Medicaid Other

## 2015-09-25 ENCOUNTER — Ambulatory Visit (HOSPITAL_COMMUNITY): Payer: Medicaid Other | Admitting: Hematology & Oncology

## 2015-09-25 NOTE — Telephone Encounter (Signed)
Patient called stating she is having trouble sleeping.  Informed patient to try melatonin to see if that helps and if not to give [place] a call back. Patient verbalized understanding.

## 2015-09-28 ENCOUNTER — Telehealth: Payer: Self-pay | Admitting: Nurse Practitioner

## 2015-09-28 NOTE — Telephone Encounter (Signed)
Please advise. Pt tried melatonin over the weekend. Bu has not tried other OTC's such as advil / tylenol pm, or benadryl

## 2015-09-29 ENCOUNTER — Other Ambulatory Visit: Payer: Self-pay | Admitting: Nurse Practitioner

## 2015-09-29 DIAGNOSIS — R339 Retention of urine, unspecified: Secondary | ICD-10-CM | POA: Diagnosis not present

## 2015-09-29 NOTE — Telephone Encounter (Signed)
Do not want to give her anything that is going to alter mental status more than it already is until sees specialist. Passenger transport manager)

## 2015-09-29 NOTE — Telephone Encounter (Signed)
Patient aware.

## 2015-09-30 ENCOUNTER — Ambulatory Visit: Payer: Medicare Other | Admitting: Pediatrics

## 2015-09-30 DIAGNOSIS — R339 Retention of urine, unspecified: Secondary | ICD-10-CM | POA: Diagnosis not present

## 2015-10-01 ENCOUNTER — Ambulatory Visit (HOSPITAL_COMMUNITY): Payer: Medicare Other

## 2015-10-02 DIAGNOSIS — R339 Retention of urine, unspecified: Secondary | ICD-10-CM | POA: Diagnosis not present

## 2015-10-04 DIAGNOSIS — R339 Retention of urine, unspecified: Secondary | ICD-10-CM | POA: Diagnosis not present

## 2015-10-05 ENCOUNTER — Encounter: Payer: Self-pay | Admitting: Pediatrics

## 2015-10-05 ENCOUNTER — Ambulatory Visit (INDEPENDENT_AMBULATORY_CARE_PROVIDER_SITE_OTHER): Payer: Medicare Other | Admitting: Pediatrics

## 2015-10-05 VITALS — BP 146/80 | HR 91 | Temp 97.1°F | Ht 67.0 in | Wt 208.8 lb

## 2015-10-05 DIAGNOSIS — R600 Localized edema: Secondary | ICD-10-CM

## 2015-10-05 DIAGNOSIS — N189 Chronic kidney disease, unspecified: Secondary | ICD-10-CM | POA: Diagnosis not present

## 2015-10-05 DIAGNOSIS — I5032 Chronic diastolic (congestive) heart failure: Secondary | ICD-10-CM

## 2015-10-05 DIAGNOSIS — I10 Essential (primary) hypertension: Secondary | ICD-10-CM | POA: Diagnosis not present

## 2015-10-05 MED ORDER — FUROSEMIDE 20 MG PO TABS: 20.0000 mg | ORAL_TABLET | Freq: Every day | ORAL | Status: DC | PRN

## 2015-10-05 NOTE — Progress Notes (Signed)
Subjective:    Patient ID: [name], female    DOB: 01-Jul-1951, 64 y.o.   MRN: [id number]  CC: swelling LE  HPI: [name] is a 64 y.o. female presenting on 10/05/2015 for Leg Swelling  Leg swelling just started over the last week. Some SOB at times, is ongoing at baseline Sleeps on three pillows at night at baseline Still has foley cath in place, seeing urology soon, says has had lots of urine output No fevers at home Had an ECHO 4 months ago with grade I diastolic dysfunction Has been coughing come, no sore throat No congestion No recent med changes No chest pain with exertion  Relevant past medical, surgical, family and social history reviewed and updated as indicated. Interim medical history since our last visit reviewed. Allergies and medications reviewed and updated.   ROS: Per HPI unless specifically indicated above  Past Medical History Patient Active Problem List   Diagnosis Date Noted  . Acute psychosis associated with endocrine, metabolic, or cerebrovascular disorder 09/21/2015  . Urinary tract infection 09/21/2015  . Hallucinations   . UTI (lower urinary tract infection)   . Delirium 09/13/2015  . Malnutrition of moderate degree ([place]) 05/29/2015  . Acute encephalopathy 05/28/2015  . Acute renal failure superimposed on stage 3 chronic kidney disease ([place]) 05/28/2015  . Protein-calorie malnutrition, severe ([place]) 04/24/2015  . Vomiting 04/11/2015  . Metabolic acidosis 30/86/5784  . Diarrhea 04/11/2015  . AKI (acute kidney injury) ([place]) 04/11/2015  . Nausea vomiting and diarrhea   . Gastroesophageal reflux disease without esophagitis 03/13/2015  . Neuropathy ([place]) 03/13/2015  . 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 Insomnia 03/25/2013  . Depression 03/25/2013  . Seizures ([place]) 03/25/2013  . Hyperlipidemia 03/25/2013  . 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 Essential hypertension 04/21/2010  . DOE (dyspnea on  exertion) 04/21/2010  . CHEST PAIN-UNSPECIFIED 04/21/2010    Current Outpatient Prescriptions  Medication Sig Dispense Refill  . acetaminophen (TYLENOL) 500 MG tablet Take 1,000 mg by mouth every 6 (six) hours as needed for moderate pain.    [name] amLODipine (NORVASC) 10 MG tablet Take 1 tablet (10 mg total) by mouth daily. 30 tablet 5  . aspirin EC 81 MG tablet Take 81 mg by mouth daily.    . calcitRIOL (ROCALTROL) 0.5 MCG capsule Take 0.5 mcg by mouth daily.     . calcium acetate (PHOSLO) 667 MG capsule Take 1 capsule by mouth 3 (three) times daily.    . cholecalciferol (VITAMIN D) 1000 UNITS tablet Take 1,000 Units by mouth daily.    . COMBIVENT RESPIMAT 20-100 MCG/ACT AERS respimat INHALE 2 PUFFS INTO THE LUNGS TWICE A DAY 4 g 4  . donepezil (ARICEPT) 10 MG tablet Take 1 tablet (10 mg total) by mouth at bedtime. 30 tablet 3  . DULoxetine (CYMBALTA) 60 MG capsule Take 60 mg by mouth daily.    . ferrous sulfate 325 (65 FE) MG tablet Take 325 mg by mouth daily with breakfast.    . gabapentin (NEURONTIN) 300 MG capsule Take 1 capsule (300 mg total) by mouth at bedtime. 30 capsule 1  . hydrALAZINE (APRESOLINE) 50 MG tablet TAKE 1 TABLET BY MOUTH THREE TIMES DAILY 90 tablet 1  . levETIRAcetam (KEPPRA) 500 MG tablet TAKE 1 TABLET BY MOUTH TWICE DAILY 60 tablet 1  . metoprolol (LOPRESSOR) 50 MG tablet Take 1 tablet (50 mg total) by mouth 2 (two) times daily. 60 tablet 5  . pantoprazole (PROTONIX) 40  MG tablet Take 1 tablet (40 mg total) by mouth daily. 30 tablet 5  . simvastatin (ZOCOR) 10 MG tablet TAKE 1 TABLET BY MOUTH IN THE EVENING FOR CHOLESTEROL 90 tablet 0  . furosemide (LASIX) 20 MG tablet Take 1 tablet (20 mg total) by mouth daily as needed. 30 tablet 0   No current facility-administered medications for this visit.       Objective:    BP 146/80 mmHg  Pulse 91  Temp(Src) 97.1 F (36.2 C) (Oral)  Ht '5\' 7"'  (1.702 m)  Wt 208 lb 12.8 oz (94.711 kg)  BMI 32.69 kg/m2  Wt Readings from  Last 3 Encounters:  10/05/15 208 lb 12.8 oz (94.711 kg)  09/22/15 204 lb (92.534 kg)  09/17/15 211 lb 9.6 oz (95.981 kg)     Gen: NAD, alert, cooperative with exam, NCAT EYES: EOMI, no scleral injection or icterus ENT:  TMs pearly gray b/l, OP without erythema LYMPH: no cervical LAD CV: NRRR, normal S1/S2, no murmur, distal pulses 2+ b/l Resp: CTABL, no wheezes, no rales, normal WOB Abd: +BS, soft, NTND. no guarding or organomegaly Ext: 1+ pitting edema LE,  warm Neuro: Alert and oriented     Assessment & Plan:    [name] was seen today for leg swelling. Ongoing for about a week. Amlodipine was increased within last month to 32m daily. Pt had ECHO with grade I diastolic dysfunction, but otherwise normal 4 months ago. Will Recheck BNP, CMP, start lasix 268mqhs for three days or until swelling in LE improves. Recheck 1-2 weeks. Has appt to see her nephrologist.   Diagnoses and all orders for this visit:  Bilateral edema of lower extremity -     furosemide (LASIX) 20 MG tablet; Take 1 tablet (20 mg total) by mouth daily as needed. -     CMP14+EGFR -     Brain natriuretic peptide  Essential hypertension Continue current meds.  Chronic kidney disease, unspecified stage Will recheck Cr today, improving from AoCKD from recent admit for ARF.  Chronic diastolic congestive heart failure (HCC) Grade I on ECHO 05/2015, lasix as above.   Follow up plan: Return in about 2 weeks (around 10/19/2015).  CaAssunta FoundMD [facility] 10/05/2015, 3:44 PM

## 2015-10-05 NOTE — Patient Instructions (Signed)
Take one lasix tablet daily for the next three days.

## 2015-10-06 DIAGNOSIS — N3941 Urge incontinence: Secondary | ICD-10-CM | POA: Diagnosis not present

## 2015-10-06 DIAGNOSIS — R3915 Urgency of urination: Secondary | ICD-10-CM | POA: Diagnosis not present

## 2015-10-06 DIAGNOSIS — R351 Nocturia: Secondary | ICD-10-CM | POA: Diagnosis not present

## 2015-10-06 DIAGNOSIS — I5032 Chronic diastolic (congestive) heart failure: Secondary | ICD-10-CM | POA: Insufficient documentation

## 2015-10-06 DIAGNOSIS — N184 Chronic kidney disease, stage 4 (severe): Secondary | ICD-10-CM | POA: Insufficient documentation

## 2015-10-06 LAB — CMP14+EGFR
ALT: 12 IU/L (ref 0–32)
AST: 17 IU/L (ref 0–40)
Albumin/Globulin Ratio: 1.3 (ref 1.1–2.5)
Albumin: 4 g/dL (ref 3.6–4.8)
Alkaline Phosphatase: 109 IU/L (ref 39–117)
BUN/Creatinine Ratio: 9 — ABNORMAL LOW (ref 11–26)
BUN: 13 mg/dL (ref 8–27)
Bilirubin Total: 0.3 mg/dL (ref 0.0–1.2)
CO2: 28 mmol/L (ref 18–29)
Calcium: 8.6 mg/dL — ABNORMAL LOW (ref 8.7–10.3)
Chloride: 97 mmol/L (ref 97–106)
Creatinine, Ser: 1.48 mg/dL — ABNORMAL HIGH (ref 0.57–1.00)
GFR calc Af Amer: 43 mL/min/{1.73_m2} — ABNORMAL LOW (ref >59)
GFR calc non Af Amer: 37 mL/min/{1.73_m2} — ABNORMAL LOW (ref >59)
Globulin, Total: 3 g/dL (ref 1.5–4.5)
Glucose: 120 mg/dL — ABNORMAL HIGH (ref 65–99)
Potassium: 3.5 mmol/L (ref 3.5–5.2)
Sodium: 146 mmol/L — ABNORMAL HIGH (ref 136–144)
Total Protein: 7 g/dL (ref 6.0–8.5)

## 2015-10-06 LAB — BRAIN NATRIURETIC PEPTIDE: BNP: 34.4 pg/mL (ref 0.0–100.0)

## 2015-10-07 ENCOUNTER — Ambulatory Visit (HOSPITAL_COMMUNITY)
Admission: RE | Admit: 2015-10-07 | Discharge: 2015-10-07 | Disposition: A | Discharge status: Home / Self Care | Payer: Medicare Other | Source: Ambulatory Visit | Attending: Pediatrics | Admitting: Pediatrics

## 2015-10-07 DIAGNOSIS — Z1231 Encounter for screening mammogram for malignant neoplasm of breast: Secondary | ICD-10-CM | POA: Diagnosis present

## 2015-10-07 DIAGNOSIS — Z0000 Encounter for general adult medical examination without abnormal findings: Secondary | ICD-10-CM

## 2015-10-08 ENCOUNTER — Other Ambulatory Visit: Payer: Self-pay | Admitting: Nurse Practitioner

## 2015-10-13 DIAGNOSIS — R339 Retention of urine, unspecified: Secondary | ICD-10-CM | POA: Diagnosis not present

## 2015-10-14 ENCOUNTER — Telehealth: Payer: Self-pay | Admitting: Pediatrics

## 2015-10-14 NOTE — Telephone Encounter (Signed)
Tried to call back re pt call about swelling. Son Hollice Espy answered, pt not there. Started on 20mg  lasix last visit, noticed minimal change in swelling per sone, having more cramping. Said she should take 40mg  lasix in the morning. Come back in to be seen for lab recheck. Pt going to call back tomorrow morning.

## 2015-10-14 NOTE — Telephone Encounter (Signed)
Please advise 

## 2015-10-20 ENCOUNTER — Encounter: Payer: Self-pay | Admitting: Pediatrics

## 2015-10-20 ENCOUNTER — Ambulatory Visit (INDEPENDENT_AMBULATORY_CARE_PROVIDER_SITE_OTHER): Payer: Medicare Other | Admitting: Pediatrics

## 2015-10-20 VITALS — BP 121/72 | HR 88 | Temp 98.0°F | Ht 67.0 in | Wt 205.0 lb

## 2015-10-20 DIAGNOSIS — I5032 Chronic diastolic (congestive) heart failure: Secondary | ICD-10-CM

## 2015-10-20 DIAGNOSIS — J449 Chronic obstructive pulmonary disease, unspecified: Secondary | ICD-10-CM

## 2015-10-20 DIAGNOSIS — E785 Hyperlipidemia, unspecified: Secondary | ICD-10-CM | POA: Diagnosis not present

## 2015-10-20 DIAGNOSIS — M1711 Unilateral primary osteoarthritis, right knee: Secondary | ICD-10-CM | POA: Diagnosis not present

## 2015-10-20 DIAGNOSIS — G4700 Insomnia, unspecified: Secondary | ICD-10-CM

## 2015-10-20 DIAGNOSIS — F329 Major depressive disorder, single episode, unspecified: Secondary | ICD-10-CM | POA: Diagnosis not present

## 2015-10-20 DIAGNOSIS — I10 Essential (primary) hypertension: Secondary | ICD-10-CM

## 2015-10-20 DIAGNOSIS — F32A Depression, unspecified: Secondary | ICD-10-CM

## 2015-10-20 MED ORDER — PRAVASTATIN SODIUM 40 MG PO TABS: 40.0000 mg | ORAL_TABLET | Freq: Every day | ORAL | Status: DC

## 2015-10-20 MED ORDER — IPRATROPIUM-ALBUTEROL 20-100 MCG/ACT IN AERS: INHALATION_SPRAY | RESPIRATORY_TRACT | Status: DC

## 2015-10-20 MED ORDER — FLUTICASONE-SALMETEROL 100-50 MCG/DOSE IN AEPB: 1.0000 | INHALATION_SPRAY | Freq: Two times a day (BID) | RESPIRATORY_TRACT | Status: DC

## 2015-10-20 MED ORDER — TRAZODONE HCL 50 MG PO TABS: 25.0000 mg | ORAL_TABLET | Freq: Every evening | ORAL | Status: DC | PRN

## 2015-10-20 NOTE — Progress Notes (Signed)
Subjective:    Patient ID: [name], female    DOB: [birth year], 64 y.o.   MRN: [id number]  CC: multiple med problems f/u  HPI: [name] is a 64 y.o. female presenting on 10/20/2015 for Edema lower legs with pain  Pain in her feet, worse with walking Pain in knees and lower legs Uses ice and it helps some Sees Dr. Ronnald Ramp a therapist in Dec for her mood problems, does want to elborate any more for me, says that it is under control Swelling improved with extra dose of lasix at home over the weekend. Still has trouble sleeping.  Relevant past medical, surgical, family and social history reviewed and updated as indicated. Interim medical history since our last visit reviewed. Allergies and medications reviewed and updated.   ROS: Per HPI unless specifically indicated above  Past Medical History Patient Active Problem List   Diagnosis Date Noted  . COPD (chronic obstructive pulmonary disease) ([place]) 10/20/2015  . Osteoarthritis of right knee 10/20/2015  . Chronic kidney disease 10/06/2015  . Chronic diastolic congestive heart failure ([place]) 10/06/2015  . Acute psychosis associated with endocrine, metabolic, or cerebrovascular disorder 09/21/2015  . Urinary tract infection 09/21/2015  . Hallucinations   . UTI (lower urinary tract infection)   . Delirium 09/13/2015  . Malnutrition of moderate degree ([place]) 05/29/2015  . Acute encephalopathy 05/28/2015  . Acute renal failure superimposed on stage 3 chronic kidney disease ([place]) 05/28/2015  . Protein-calorie malnutrition, severe ([place]) 04/24/2015  . Vomiting 04/11/2015  . Metabolic acidosis [id number]  . Diarrhea 04/11/2015  . AKI (acute kidney injury) ([place]) 04/11/2015  . Nausea vomiting and diarrhea   . Gastroesophageal reflux disease without esophagitis 03/13/2015  . Neuropathy ([place]) 03/13/2015  . 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 Insomnia 03/25/2013  . Depression 03/25/2013  . Seizures ([place]) 03/25/2013  .  Hyperlipidemia 03/25/2013  . 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 Essential hypertension 04/21/2010  . DOE (dyspnea on exertion) 04/21/2010  . CHEST PAIN-UNSPECIFIED 04/21/2010    Current Outpatient Prescriptions  Medication Sig Dispense Refill  . acetaminophen (TYLENOL) 500 MG tablet Take 1,000 mg by mouth every 6 (six) hours as needed for moderate pain.    [name] amLODipine (NORVASC) 10 MG tablet Take 1 tablet (10 mg total) by mouth daily. 30 tablet 5  . aspirin EC 81 MG tablet Take 81 mg by mouth daily.    . calcitRIOL (ROCALTROL) 0.5 MCG capsule Take 0.5 mcg by mouth daily.     . calcium acetate (PHOSLO) 667 MG capsule Take 1 capsule by mouth 3 (three) times daily.    . cholecalciferol (VITAMIN D) 1000 UNITS tablet Take 1,000 Units by mouth daily.    [name] donepezil (ARICEPT) 10 MG tablet Take 1 tablet (10 mg total) by mouth at bedtime. 30 tablet 3  . ferrous sulfate 325 (65 FE) MG tablet Take 325 mg by mouth daily with breakfast.    . furosemide (LASIX) 20 MG tablet Take 1 tablet (20 mg total) by mouth daily as needed. (Patient taking differently: Take 40 mg by mouth daily as needed. ) 30 tablet 0  . gabapentin (NEURONTIN) 300 MG capsule Take 1 capsule (300 mg total) by mouth at bedtime. 30 capsule 1  . hydrALAZINE (APRESOLINE) 50 MG tablet TAKE 1 TABLET BY MOUTH THREE TIMES DAILY 90 tablet 1  . Ipratropium-Albuterol (COMBIVENT RESPIMAT) 20-100 MCG/ACT AERS respimat INHALE 2 PUFFS INTO THE LUNGS TWICE A DAY 4 g 4  .  levETIRAcetam (KEPPRA) 500 MG tablet TAKE 1 TABLET BY MOUTH TWICE DAILY 60 tablet 2  . metoprolol (LOPRESSOR) 50 MG tablet Take 1 tablet (50 mg total) by mouth 2 (two) times daily. 60 tablet 5  . pantoprazole (PROTONIX) 40 MG tablet Take 1 tablet (40 mg total) by mouth daily. 30 tablet 5  . DULoxetine (CYMBALTA) 60 MG capsule Take 60 mg by mouth daily.    . Fluticasone-Salmeterol (ADVAIR) 100-50 MCG/DOSE AEPB Inhale 1 puff into the lungs 2 (two) times  daily. 1 each 3  . pravastatin (PRAVACHOL) 40 MG tablet Take 1 tablet (40 mg total) by mouth daily. 90 tablet 3  . traZODone (DESYREL) 50 MG tablet Take 0.5-1 tablets (25-50 mg total) by mouth at bedtime as needed for sleep. 30 tablet 3   No current facility-administered medications for this visit.       Objective:    BP 121/72 mmHg  Pulse 88  Temp(Src) 98 F (36.7 C) (Oral)  Ht 5\' 7"  (1.702 m)  Wt 205 lb (92.987 kg)  BMI 32.10 kg/m2  Wt Readings from Last 3 Encounters:  10/20/15 205 lb (92.987 kg)  10/05/15 208 lb 12.8 oz (94.711 kg)  09/22/15 204 lb (92.534 kg)    Gen: NAD, alert, cooperative with exam EYES: EOMI, no scleral injection or icterus ENT:  TMs pearly gray b/l, OP without erythema LYMPH: no cervical LAD CV: NRRR, normal S1/S2, no murmur, distal pulses 2+ b/l Resp: CTABL, no wheezes, normal WOB Abd: +BS, soft, NTND. no guarding or organomegaly Ext: No edema, warm Neuro: Alert and oriented MSK: normal muscle bulk     Assessment & Plan:    Courtny was seen today for mulitple med problem f/u.  Diagnoses and all orders for this visit:  Essential hypertension well controlled today, continue currentmeds.  Chronic diastolic congestive heart failure (HCC) Edema in LE is much improved after several days of extra lasix dose. Go back to once a day lasix 20mg .  COPD Normal breathing exam today, has run out of meds, well controlled with below, continue. -     Ipratropium-Albuterol (COMBIVENT RESPIMAT) 20-100 MCG/ACT AERS respimat; INHALE 2 PUFFS INTO THE LUNGS TWICE A DAY -     Fluticasone-Salmeterol (ADVAIR) 100-50 MCG/DOSE AEPB; Inhale 1 puff into the lungs 2 (two) times daily.  Depression on cymbalta daily. Has follow up with psychiatry coming up. Pt says symptoms are well controlled now.  Hyperlipidemia continue current med. -     pravastatin (PRAVACHOL) 40 MG tablet; Take 1 tablet (40 mg total) by mouth daily.  Insomnia Has been admitted with AMS multiple times  thought due to benzo and ambien overdose. Discussed sleep hygiene. Trial trazodone 25-50mg  at night to help with sleep if not able to rest on her own. Try not to take every night. -     traZODone (DESYREL) 50 MG tablet; Take 0.5-1 tablets (25-50 mg total) by mouth at bedtime as needed for sleep.     Follow up plan: Return in about 3 months (around 01/20/2016).  Assunta Found, MD [facility] 10/20/2015, 10:47 AM

## 2015-10-20 NOTE — Patient Instructions (Addendum)
aspercream  Anacreme with lidocaine  Lasix 20 mg once a day

## 2015-10-21 DIAGNOSIS — D649 Anemia, unspecified: Secondary | ICD-10-CM | POA: Diagnosis not present

## 2015-10-21 DIAGNOSIS — Z79899 Other long term (current) drug therapy: Secondary | ICD-10-CM | POA: Diagnosis not present

## 2015-10-21 DIAGNOSIS — N183 Chronic kidney disease, stage 3 (moderate): Secondary | ICD-10-CM | POA: Diagnosis not present

## 2015-10-21 DIAGNOSIS — E559 Vitamin D deficiency, unspecified: Secondary | ICD-10-CM | POA: Diagnosis not present

## 2015-10-21 DIAGNOSIS — I10 Essential (primary) hypertension: Secondary | ICD-10-CM | POA: Diagnosis not present

## 2015-10-21 DIAGNOSIS — R809 Proteinuria, unspecified: Secondary | ICD-10-CM | POA: Diagnosis not present

## 2015-10-27 ENCOUNTER — Other Ambulatory Visit: Payer: Self-pay | Admitting: Nurse Practitioner

## 2015-10-27 DIAGNOSIS — N183 Chronic kidney disease, stage 3 (moderate): Secondary | ICD-10-CM | POA: Diagnosis not present

## 2015-10-27 DIAGNOSIS — R809 Proteinuria, unspecified: Secondary | ICD-10-CM | POA: Diagnosis not present

## 2015-10-27 DIAGNOSIS — D649 Anemia, unspecified: Secondary | ICD-10-CM | POA: Diagnosis not present

## 2015-10-27 DIAGNOSIS — E875 Hyperkalemia: Secondary | ICD-10-CM | POA: Diagnosis not present

## 2015-10-28 ENCOUNTER — Encounter (INDEPENDENT_AMBULATORY_CARE_PROVIDER_SITE_OTHER): Payer: Medicare Other | Admitting: Family Medicine

## 2015-10-28 DIAGNOSIS — N183 Chronic kidney disease, stage 3 (moderate): Secondary | ICD-10-CM

## 2015-10-28 DIAGNOSIS — D472 Monoclonal gammopathy: Secondary | ICD-10-CM

## 2015-10-28 DIAGNOSIS — I129 Hypertensive chronic kidney disease with stage 1 through stage 4 chronic kidney disease, or unspecified chronic kidney disease: Secondary | ICD-10-CM

## 2015-10-28 DIAGNOSIS — R339 Retention of urine, unspecified: Secondary | ICD-10-CM | POA: Diagnosis not present

## 2015-10-30 DIAGNOSIS — R339 Retention of urine, unspecified: Secondary | ICD-10-CM | POA: Diagnosis not present

## 2015-11-03 DIAGNOSIS — R339 Retention of urine, unspecified: Secondary | ICD-10-CM | POA: Diagnosis not present

## 2015-11-04 ENCOUNTER — Other Ambulatory Visit: Payer: Self-pay | Admitting: *Deleted

## 2015-11-04 DIAGNOSIS — Z1211 Encounter for screening for malignant neoplasm of colon: Secondary | ICD-10-CM

## 2015-11-09 ENCOUNTER — Other Ambulatory Visit: Payer: Self-pay | Admitting: Nurse Practitioner

## 2015-11-17 DIAGNOSIS — R350 Frequency of micturition: Secondary | ICD-10-CM | POA: Diagnosis not present

## 2015-11-17 DIAGNOSIS — R351 Nocturia: Secondary | ICD-10-CM | POA: Diagnosis not present

## 2015-11-29 DIAGNOSIS — N183 Chronic kidney disease, stage 3 (moderate): Secondary | ICD-10-CM | POA: Diagnosis not present

## 2015-11-29 DIAGNOSIS — R51 Headache: Secondary | ICD-10-CM | POA: Diagnosis not present

## 2015-11-29 DIAGNOSIS — R55 Syncope and collapse: Secondary | ICD-10-CM | POA: Diagnosis not present

## 2015-11-29 DIAGNOSIS — N179 Acute kidney failure, unspecified: Secondary | ICD-10-CM | POA: Diagnosis not present

## 2015-11-29 DIAGNOSIS — R531 Weakness: Secondary | ICD-10-CM | POA: Diagnosis not present

## 2015-11-29 DIAGNOSIS — I951 Orthostatic hypotension: Secondary | ICD-10-CM | POA: Diagnosis not present

## 2015-11-29 DIAGNOSIS — M47816 Spondylosis without myelopathy or radiculopathy, lumbar region: Secondary | ICD-10-CM | POA: Diagnosis not present

## 2015-11-29 DIAGNOSIS — J9811 Atelectasis: Secondary | ICD-10-CM | POA: Diagnosis not present

## 2015-11-29 DIAGNOSIS — R4182 Altered mental status, unspecified: Secondary | ICD-10-CM | POA: Diagnosis not present

## 2015-11-29 DIAGNOSIS — M25552 Pain in left hip: Secondary | ICD-10-CM | POA: Diagnosis not present

## 2015-11-30 DIAGNOSIS — I081 Rheumatic disorders of both mitral and tricuspid valves: Secondary | ICD-10-CM | POA: Diagnosis not present

## 2015-11-30 DIAGNOSIS — I519 Heart disease, unspecified: Secondary | ICD-10-CM | POA: Diagnosis not present

## 2015-11-30 DIAGNOSIS — N179 Acute kidney failure, unspecified: Secondary | ICD-10-CM | POA: Diagnosis not present

## 2015-11-30 DIAGNOSIS — R55 Syncope and collapse: Secondary | ICD-10-CM | POA: Insufficient documentation

## 2015-11-30 DIAGNOSIS — N183 Chronic kidney disease, stage 3 (moderate): Secondary | ICD-10-CM | POA: Diagnosis not present

## 2015-11-30 DIAGNOSIS — I517 Cardiomegaly: Secondary | ICD-10-CM | POA: Diagnosis not present

## 2015-11-30 DIAGNOSIS — F332 Major depressive disorder, recurrent severe without psychotic features: Secondary | ICD-10-CM | POA: Insufficient documentation

## 2015-12-01 ENCOUNTER — Telehealth: Payer: Self-pay | Admitting: Nurse Practitioner

## 2015-12-01 ENCOUNTER — Encounter: Payer: Self-pay | Admitting: Pulmonary Disease

## 2015-12-01 ENCOUNTER — Ambulatory Visit (INDEPENDENT_AMBULATORY_CARE_PROVIDER_SITE_OTHER): Payer: Medicare Other | Admitting: Pulmonary Disease

## 2015-12-01 VITALS — BP 142/72 | HR 81 | Ht 67.0 in | Wt 201.2 lb

## 2015-12-01 DIAGNOSIS — R0609 Other forms of dyspnea: Secondary | ICD-10-CM | POA: Diagnosis not present

## 2015-12-01 DIAGNOSIS — R0600 Dyspnea, unspecified: Secondary | ICD-10-CM

## 2015-12-01 DIAGNOSIS — J986 Disorders of diaphragm: Secondary | ICD-10-CM

## 2015-12-01 MED ORDER — FLUTICASONE PROPIONATE 50 MCG/ACT NA SUSP: 2.0000 | Freq: Every day | NASAL | Status: DC

## 2015-12-01 NOTE — Patient Instructions (Signed)
We will start you on a Flonase nasal spray. Continue Advair as prescribed.  Return to clinic in 3 months.

## 2015-12-01 NOTE — Progress Notes (Signed)
Subjective:    Patient ID: [name], female    DOB: [birth year], 65 y.o.   MRN: [id number]  PROBLEM LIST: Chronic right diaphragm paralysis Lung lobectomy Restrictive lung disease Dyspnea  HPI  [name] is a 65 year old with past medical history as below. He is a former patient of Dr. Gwenette Richardson who had evaluated her for lung lobectomy, chronic diaphragm paralysis, restrictive lung disease. She has a long history of tobacco use for 30 years, but quit in 2013. 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 As per the patient the right upper lobe was removed. It was thought that her dyspnea was secondary to her restriction from lung resection, diaphragm paralysis.  Patient returns today with new symptoms of cough with wheezing. This is associated with sinusitis, rhinitis, postnasal drip. She was started on Advair by her PCP earlier this month which has improved his symptoms. She also has history of GERD and is on a PPI.  DATA: Sniff test 07/21/14 Right diaphragm paralysis.  PFTs 08/14/14 FVC 1.36 (48%] FEV1 1.15 (52%) F/F 85 TLC 49% DLCO 47% Severe restriction with reduction in diffusion capacity. Diffusion capacity corrected for alveolar volume.  Chest x-ray 10/23/169 Chronic right diaphragm elevation, chronic atelectatic changes at the right base   Past Medical History  Diagnosis Date  . Seizures ([place])   . Chronic headaches   . Essential hypertension   . Osteoporosis   . Depression   . 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 URI (upper respiratory infection)   . CKD (chronic kidney disease) stage 3, GFR 30-59 ml/min   . Restrictive lung disease   . Hyperlipidemia   . Right leg weakness   . Encephalopathy     Current outpatient prescriptions:  .  acetaminophen (TYLENOL) 500 MG tablet, Take 1,000 mg by mouth every 6 (six) hours as needed for moderate pain., Disp: , Rfl:  .  amLODipine (NORVASC) 10 MG tablet,  Take 1 tablet (10 mg total) by mouth daily., Disp: 30 tablet, Rfl: 5 .  aspirin EC 81 MG tablet, Take 81 mg by mouth daily., Disp: , Rfl:  .  calcitRIOL (ROCALTROL) 0.5 MCG capsule, Take 0.5 mcg by mouth daily. , Disp: , Rfl:  .  calcium acetate (PHOSLO) 667 MG capsule, Take 1 capsule by mouth 3 (three) times daily., Disp: , Rfl:  .  cholecalciferol (VITAMIN D) 1000 UNITS tablet, Take 1,000 Units by mouth daily., Disp: , Rfl:  .  donepezil (ARICEPT) 10 MG tablet, Take 1 tablet (10 mg total) by mouth at bedtime., Disp: 30 tablet, Rfl: 3 .  DULoxetine (CYMBALTA) 60 MG capsule, Take 60 mg by mouth daily., Disp: , Rfl:  .  ferrous sulfate 325 (65 FE) MG tablet, Take 325 mg by mouth daily with breakfast., Disp: , Rfl:  .  Fluticasone-Salmeterol (ADVAIR) 100-50 MCG/DOSE AEPB, Inhale 1 puff into the lungs 2 (two) times daily., Disp: 1 each, Rfl: 3 .  furosemide (LASIX) 20 MG tablet, TAKE ONE TABLET BY MOUTH DAILY AS NEEDED, Disp: 30 tablet, Rfl: 4 .  gabapentin (NEURONTIN) 300 MG capsule, Take 1 capsule (300 mg total) by mouth at bedtime., Disp: 30 capsule, Rfl: 1 .  hydrALAZINE (APRESOLINE) 50 MG tablet, TAKE 1 TABLET BY MOUTH THREE TIMES DAILY, Disp: 90 tablet, Rfl: 2 .  Ipratropium-Albuterol (COMBIVENT RESPIMAT) 20-100 MCG/ACT AERS respimat, INHALE 2 PUFFS INTO THE LUNGS TWICE A DAY, Disp: 4 g, Rfl: 4 .  levETIRAcetam (KEPPRA) 500  MG tablet, TAKE 1 TABLET BY MOUTH TWICE DAILY, Disp: 60 tablet, Rfl: 2 .  lidocaine (XYLOCAINE) 4 % external solution, APPLY TOPICALLY THREE TIMES A DAY AS NEEDED, Disp: , Rfl:  .  metoprolol (LOPRESSOR) 50 MG tablet, Take 1 tablet (50 mg total) by mouth 2 (two) times daily., Disp: 60 tablet, Rfl: 5 .  pantoprazole (PROTONIX) 40 MG tablet, Take 1 tablet (40 mg total) by mouth daily., Disp: 30 tablet, Rfl: 5 .  pravastatin (PRAVACHOL) 40 MG tablet, Take 1 tablet (40 mg total) by mouth daily., Disp: 90 tablet, Rfl: 3 .  fluticasone (FLONASE) 50 MCG/ACT nasal spray, Place 2  sprays into both nostrils daily., Disp: 16 g, Rfl: 2  Review of Systems Cough, wheezing, dyspnea, rhinitis, postnasal drip. Denies any sputum production, fevers, chills, hemoptysis. Denies any chest pain, palpitation. Denies any nausea, vomiting, diarrhea, constipation. All other review of systems are negative    Objective:   Physical Exam Blood pressure 142/72, pulse 81, height 5\' 7"  (1.702 m), weight 201 lb 3.2 oz (91.264 kg), SpO2 99 %. Gen: No apparent distress Neuro: No gross focal deficits. Neck: No JVD, lymphadenopathy, thyromegaly. RS: Clear, no wheeze, crackles CVS: S1-S2 heard, no murmurs rubs gallops. Abdomen: Soft, positive bowel sounds. Extremities: No edema.    Assessment & Plan:  #1 Restrictive lung disease Likely secondary to her right upper lobectomy, right hemidiaphragm paralysis. It is likely that her hemidiaphragm paralysis occurred due to her surgery. Her PFTs show severe restriction and DLCO reduction. However the DLCO corrects for alveolar volume.  #2 Cough, wheezing. She has new symptoms of cough, wheezing. This sounds more like a reactive airway disease issue, probably exacerbated by her postnasal drip and GERD. She is already on a PPI and Advair with improvement in symptoms. She'll continue the same and will also start Flonase nasal spray.  Plan: - Continue Advair, Protonix - Start Flonase nasal spray  Vanessa Garfinkel MD [facility] Pager [phone] If no answer or after 3pm call: [phone] 12/01/2015, 4:44 PM

## 2015-12-01 NOTE — Telephone Encounter (Signed)
From what oi remember correctly we stopped it because was to drugged up and was falling while o  That- i would rather you not start backon it.

## 2015-12-02 NOTE — Telephone Encounter (Signed)
Patient aware, medication will not be restarted.

## 2015-12-09 ENCOUNTER — Telehealth: Payer: Self-pay | Admitting: *Deleted

## 2015-12-09 NOTE — Telephone Encounter (Signed)
-----   Message from Eustaquio Maize, MD sent at 12/07/2015 10:00 AM EST ----- Regarding: overdue fecal imunno test/colon ca screen Pt has not yet brought her stool sample back testing for blood for colon cancer screen given to her last month. Can you call pt to encourage her to do that? Thanks!  ----- Message -----    From: SYSTEM    Sent: 12/03/2015  12:05 AM      To: Eustaquio Maize, MD

## 2015-12-09 NOTE — Telephone Encounter (Signed)
Aware, need to return FOBT cards.

## 2015-12-11 ENCOUNTER — Encounter (HOSPITAL_BASED_OUTPATIENT_CLINIC_OR_DEPARTMENT_OTHER): Payer: Medicare Other | Admitting: Hematology & Oncology

## 2015-12-11 ENCOUNTER — Encounter (HOSPITAL_COMMUNITY): Payer: Self-pay | Admitting: Hematology & Oncology

## 2015-12-11 ENCOUNTER — Encounter (HOSPITAL_COMMUNITY): Payer: Medicare Other | Attending: Hematology & Oncology

## 2015-12-11 ENCOUNTER — Ambulatory Visit (HOSPITAL_COMMUNITY)
Admission: RE | Admit: 2015-12-11 | Discharge: 2015-12-11 | Disposition: A | Discharge status: Home / Self Care | Payer: Medicare Other | Source: Ambulatory Visit | Attending: Hematology & Oncology | Admitting: Hematology & Oncology

## 2015-12-11 ENCOUNTER — Ambulatory Visit (HOSPITAL_COMMUNITY): Payer: Medicare Other

## 2015-12-11 ENCOUNTER — Ambulatory Visit (HOSPITAL_COMMUNITY): Payer: Medicare Other | Admitting: Hematology & Oncology

## 2015-12-11 VITALS — BP 146/60 | HR 69 | Temp 97.4°F | Resp 18 | Wt 196.6 lb

## 2015-12-11 DIAGNOSIS — D472 Monoclonal gammopathy: Secondary | ICD-10-CM

## 2015-12-11 DIAGNOSIS — I10 Essential (primary) hypertension: Secondary | ICD-10-CM | POA: Diagnosis not present

## 2015-12-11 DIAGNOSIS — J9811 Atelectasis: Secondary | ICD-10-CM | POA: Insufficient documentation

## 2015-12-11 DIAGNOSIS — D71 Functional disorders of polymorphonuclear neutrophils: Secondary | ICD-10-CM | POA: Diagnosis not present

## 2015-12-11 DIAGNOSIS — N189 Chronic kidney disease, unspecified: Secondary | ICD-10-CM | POA: Diagnosis not present

## 2015-12-11 DIAGNOSIS — M5030 Other cervical disc degeneration, unspecified cervical region: Secondary | ICD-10-CM | POA: Insufficient documentation

## 2015-12-11 DIAGNOSIS — M5136 Other intervertebral disc degeneration, lumbar region: Secondary | ICD-10-CM | POA: Diagnosis not present

## 2015-12-11 DIAGNOSIS — M479 Spondylosis, unspecified: Secondary | ICD-10-CM | POA: Diagnosis not present

## 2015-12-11 LAB — CBC WITH DIFFERENTIAL/PLATELET
Basophils Absolute: 0.1 10*3/uL (ref 0.0–0.1)
Basophils Relative: 2 %
Eosinophils Absolute: 0.2 10*3/uL (ref 0.0–0.7)
Eosinophils Relative: 5 %
HCT: 39.9 % (ref 36.0–46.0)
Hemoglobin: 12.7 g/dL (ref 12.0–15.0)
Lymphocytes Relative: 25 %
Lymphs Abs: 1.2 10*3/uL (ref 0.7–4.0)
MCH: 31 pg (ref 26.0–34.0)
MCHC: 31.8 g/dL (ref 30.0–36.0)
MCV: 97.3 fL (ref 78.0–100.0)
Monocytes Absolute: 0.3 10*3/uL (ref 0.1–1.0)
Monocytes Relative: 7 %
Neutro Abs: 2.9 10*3/uL (ref 1.7–7.7)
Neutrophils Relative %: 61 %
Platelets: 427 10*3/uL — ABNORMAL HIGH (ref 150–400)
RBC: 4.1 MIL/uL (ref 3.87–5.11)
RDW: 12.8 % (ref 11.5–15.5)
WBC: 4.7 10*3/uL (ref 4.0–10.5)

## 2015-12-11 LAB — COMPREHENSIVE METABOLIC PANEL
ALT: 4 U/L — ABNORMAL LOW (ref 14–54)
AST: 14 U/L — ABNORMAL LOW (ref 15–41)
Albumin: 4.1 g/dL (ref 3.5–5.0)
Alkaline Phosphatase: 119 U/L (ref 38–126)
Anion gap: 10 (ref 5–15)
BUN: 16 mg/dL (ref 6–20)
CO2: 25 mmol/L (ref 22–32)
Calcium: 9.2 mg/dL (ref 8.9–10.3)
Chloride: 106 mmol/L (ref 101–111)
Creatinine, Ser: 1.4 mg/dL — ABNORMAL HIGH (ref 0.44–1.00)
GFR calc Af Amer: 45 mL/min — ABNORMAL LOW (ref >60)
GFR calc non Af Amer: 39 mL/min — ABNORMAL LOW (ref >60)
Glucose, Bld: 94 mg/dL (ref 65–99)
Potassium: 3.8 mmol/L (ref 3.5–5.1)
Sodium: 141 mmol/L (ref 135–145)
Total Bilirubin: 0.7 mg/dL (ref 0.3–1.2)
Total Protein: 7.7 g/dL (ref 6.5–8.1)

## 2015-12-11 NOTE — Progress Notes (Signed)
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 - SEE COMMENT. PERIPHERAL BLOOD: - UNREMARKABLE. Diagnosis Note The bone marrow is overall normocellular. There is a mild plasmacytosis (approximately 5%) which is polytypic by light chain immunohistochemistry. These findings are non-specific and are not diagnostic for a plasma cell neoplasm. Vanessa Males MD Pathologist, Electronic Signature (Case signed 03/03/2014) GROSS AND MICROSCOPIC INFORMATIO  CKD  CURRENT THERAPY: Observation  INTERVAL HISTORY: [name] 65 y.o. female returns for follow up of presumed MGUS. She has recently been hospitalized for MS changes.  She was diagnosed with encephalopathy and acute on chronic renal failure.   [name] returns to the [facility] alone today. She says she's doing a little bit better than the last time she was here, and confirms that she's been at home and had a "real good holiday."  She remarks that she had a fall last month; her "leg just gave away on her."   [name] reports that her energy has been "so-so." She says she's eating the same as always, but in spite of this, she's losing weight. She reports that last week she was 212 lbs, and now she's down to 196 and "doesn't know why she's losing weight the way she is." She confirms that she is the one who cooks, and also confirms that she isn't changing the way she eats, or eating any less. She denies any belly pain, nausea, or vomiting; then later alters this statement, saying that sometimes she has diarrhea and is nauseated. She treats her nausea with kapoectate or ginger ale, commenting that she "thought it might just be a virus."  She  says she's been having swelling in her feet and legs too, though this is not the case today.  [name] sees her PCP again on the 27th of next month. She denies anything else different, other than having a lot of anxiety. She says it's just "flared back up again." "They took me off of my anxiety pills, and it's just been working up ever since." She says that the rationale behind taking her off of the anxiety pills was "at that time, I was taking too much medicine."   She has not had a colonoscopy in a very long time.  MEDICAL HISTORY: Past Medical History  Diagnosis Date  . Seizures ([place])   . Chronic headaches   . Essential hypertension   . Osteoporosis   . Depression   . 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 URI (upper respiratory infection)   . CKD (chronic kidney disease) stage 3, GFR 30-59 ml/min   . Restrictive lung disease   . Hyperlipidemia   . Right leg weakness   . Encephalopathy     has Essential hypertension; DOE (dyspnea on exertion); CHEST PAIN-UNSPECIFIED; Insomnia; Depression; Seizures ([place]); Hyperlipidemia; MGUS (monoclonal gammopathy of unknown significance); Diaphragm paralysis; Gastroesophageal reflux disease without esophagitis; Neuropathy ([place]); Vomiting; Metabolic acidosis; Diarrhea; AKI (acute kidney injury) ([place]); Nausea vomiting and diarrhea; Protein-calorie malnutrition, severe ([place]); Acute encephalopathy; Acute renal failure superimposed on stage 3 chronic kidney disease ([place]); Malnutrition of moderate degree ([place]); Delirium; Acute psychosis associated with endocrine, metabolic, or cerebrovascular disorder; Urinary tract infection; Hallucinations; UTI (lower urinary tract infection); Chronic kidney disease; Chronic diastolic congestive heart failure ([place]);  COPD (chronic obstructive pulmonary disease) ([place]); and Osteoarthritis of right knee on her problem list.     is allergic to rofecoxib; asa; penicillins; sulfonamide derivatives;  and tylenol with codeine #3.  [name] had no medications administered during this visit.   SURGICAL HISTORY: Past Surgical History  Procedure Laterality Date  . Breast reduction surgery    . Abdominal hysterectomy    . Lung lobectomy Right     Cyst  . Appendectomy    . Cholecystectomy    . Back surgery  2002    SOCIAL HISTORY: Social History   Social History  . Marital Status: Widowed    Spouse Name: N/A  . Number of Children: N/A  . Years of Education: N/A   Occupational History  . Unemployed    Social History Main Topics  . Smoking status: Former Smoker -- 1.00 packs/day for 30 years    Types: Cigarettes    Quit date: 11/13/2012  . Smokeless tobacco: Never Used  . Alcohol Use: No  . Drug Use: No  . Sexual Activity: Not on file   Other Topics Concern  . Not on file   Social History Narrative    FAMILY HISTORY: Family History  Problem Relation Age of Onset  . Hypertension Mother   . Heart disease Mother   . Diabetes Mother   . Kidney disease Mother   . Arthritis Mother   . Hypertension Father     Review of Systems  Constitutional: Negative for fever, chills and weight loss.  HENT: Negative for hearing loss.   Eyes: Negative for blurred vision and double vision.  Respiratory: Negative for cough.   Cardiovascular: Negative for chest pain and palpitations.  Gastrointestinal: Negative for nausea, vomiting, abdominal pain, diarrhea, constipation and blood in stool.  Genitourinary: Negative for dysuria, urgency, frequency and hematuria.  Musculoskeletal: Positive for joint pain. Negative for myalgias and back pain.       Joint pain mostly in hips and right leg  Skin: Negative for rash.  Neurological: Negative for dizziness, tingling and headaches.  Psychiatric/Behavioral: Negative for depression.  14 point review of systems was performed and is negative except as detailed under history of present illness and above   PHYSICAL EXAMINATION  ECOG  PERFORMANCE STATUS: 1 - Symptomatic but completely ambulatory  Filed Vitals:   12/11/15 1109  BP: 146/60  Pulse: 69  Temp: 97.4 F (36.3 C)  Resp: 18   Physical Exam  Constitutional: She is oriented to person, place, and time and well-developed, well-nourished, and in no distress.  HENT:  Head: Normocephalic and atraumatic.  Nose: Nose normal.  Mouth/Throat: Oropharynx is clear and moist. No oropharyngeal exudate.  Eyes: Conjunctivae and EOM are normal. Pupils are equal, round, and reactive to light. Right eye exhibits no discharge. Left eye exhibits no discharge. No scleral icterus.  Neck: Normal range of motion. Neck supple. No tracheal deviation present. No thyromegaly present.  Cardiovascular: Normal rate, regular rhythm and normal heart sounds.  Exam reveals no gallop and no friction rub.   No murmur heard. Occasional ectopy  Pulmonary/Chest: Effort normal and breath sounds normal. She has no wheezes. She has no rales.  Abdominal: Soft. Bowel sounds are normal. She exhibits no distension and no mass. There is no tenderness. There is no rebound and no guarding.  Musculoskeletal: Normal range of motion. She exhibits no edema.  Lymphadenopathy:    She has no cervical adenopathy.  Neurological: She is alert and oriented to person, place, and  time. She has normal reflexes. No cranial nerve deficit. Gait normal. Coordination normal.  Skin: Skin is warm and dry. No rash noted.  Psychiatric: Mood, memory, affect and judgment normal.  Nursing note and vitals reviewed.   LABORATORY DATA: I have reviewed the data as listed  CBC    Component Value Date/Time   WBC 4.7 12/11/2015 1008   WBC 9.3 04/10/2015 1244   RBC 4.10 12/11/2015 1008   RBC 5.00 04/10/2015 1244   HGB 12.7 12/11/2015 1008   HGB 14.4 04/10/2015 1244   HCT 39.9 12/11/2015 1008   HCT 40.9 05/29/2015 0801   HCT 47.9 04/10/2015 1244   PLT 427* 12/11/2015 1008   MCV 97.3 12/11/2015 1008   MCV 95.8 04/10/2015 1244     MCH 31.0 12/11/2015 1008   MCH 28.8 04/10/2015 1244   MCHC 31.8 12/11/2015 1008   MCHC 30.1* 04/10/2015 1244   RDW 12.8 12/11/2015 1008   LYMPHSABS 1.2 12/11/2015 1008   MONOABS 0.3 12/11/2015 1008   EOSABS 0.2 12/11/2015 1008   BASOSABS 0.1 12/11/2015 1008   CMP     Component Value Date/Time   NA 141 12/11/2015 1008   NA 146* 10/05/2015 1547   K 3.8 12/11/2015 1008   CL 106 12/11/2015 1008   CO2 25 12/11/2015 1008   GLUCOSE 94 12/11/2015 1008   GLUCOSE 120* 10/05/2015 1547   BUN 16 12/11/2015 1008   BUN 13 10/05/2015 1547   CREATININE 1.40* 12/11/2015 1008   CREATININE 1.40* 06/05/2013 1103   CALCIUM 9.2 12/11/2015 1008   PROT 7.7 12/11/2015 1008   PROT 7.0 10/05/2015 1547   ALBUMIN 4.1 12/11/2015 1008   ALBUMIN 4.0 10/05/2015 1547   AST 14* 12/11/2015 1008   ALT 4* 12/11/2015 1008   ALKPHOS 119 12/11/2015 1008   BILITOT 0.7 12/11/2015 1008   BILITOT 0.3 10/05/2015 1547   GFRNONAA 39* 12/11/2015 1008   GFRAA 45* 12/11/2015 1008     PATHOLOGY: Diagnosis Bone Marrow, Aspirate,Biopsy, and Clot, right iliac - NORMOCELLULAR MARROW WITH MILD POLYTYPIC PLASMACYTOSIS (5%). - SEE COMMENT. PERIPHERAL BLOOD: - UNREMARKABLE. Diagnosis Note The bone marrow is overall normocellular. There is a mild plasmacytosis (approximately 5%) which is polytypic by light chain immunohistochemistry. These findings are non-specific and are not diagnostic for a plasma cell neoplasm. Vanessa Males MD   RADIOLOGY:  CLINICAL DATA: Monoclonal gammopathy  EXAM: METASTATIC BONE SURVEY  COMPARISON: Metastatic bone survey of 11/14/2012, and chest x-ray of 04/11/2015  FINDINGS: No active infiltrate or effusion is seen. There is chronic elevation of the right hemidiaphragm with right basilar atelectasis present. Mild cardiomegaly is stable.  A lateral view the skull shows no radiolucent lesion. The cervical vertebrae remain somewhat straightened alignment with degenerative disc  disease rim C3 to C6 with loss of disc space and sclerosis.  The thoracic vertebrae are in normal alignment. No compression deformity is seen.  The lumbar vertebrae remain in normal alignment with mild degenerative change diffusely. No compression deformity is seen.  Views of both shoulders and both humerus show no radiolucent lesion.  In view of the pelvis shows no radiolucent lesion. The hips are in normal position. The pelvic rami are intact.  Views of both hips and femurs show no radiolucent lesion.  Views of both tibia and fibula as well as the forearms show no radiolucent lesions.  IMPRESSION: No evidence of multiple myeloma is seen by plain film. There are diffuse degenerative changes throughout the cervical, thoracic, and lumbar spine.   Electronically  Signed  By: Ivar Drape M.D.  On: 05/21/2015 15:51    ASSESSMENT and THERAPY PLAN:  IgG kappa MGUS CKD BMBX 02/2014 HTN BMBX 2015 with mild 5% polytypic plasmacytosis  Kidney function is stable. Myeloma labs are pending.  Bone marrow biopsy was performed in 2015, M spike is small, normal CBC, normal myeloma survey; I feel ongoing observation is reasonable.   We will continue with observation for now. We will keep the patient on her 6 month office visits. Should anything change in the interim she can return to the clinic for additional follow-up prior to her next established visit.  All questions were answered. The patient knows to call the clinic with any problems, questions or concerns. We can certainly see the patient much sooner if necessary.    Orders Placed This Encounter  Procedures  . DG Bone Survey Met    Standing Status: Future     Number of Occurrences:      Standing Expiration Date: 02/10/2017    Order Specific Question:  Reason for Exam (SYMPTOM  OR DIAGNOSIS REQUIRED)    Answer:  MGUS    Order Specific Question:  Preferred imaging location?    Answer:  [facility]   This note  was electronically signed.  This document serves as a record of services personally performed by Ancil Linsey, MD. It was created on her behalf by Toni Amend, a trained medical scribe. The creation of this record is based on the scribe's personal observations and the provider's statements to them. This document has been checked and approved by the attending provider.  I have reviewed the above documentation for accuracy and completeness, and I agree with the above.  Molli Hazard, MD

## 2015-12-11 NOTE — Patient Instructions (Addendum)
[facility] at [facility] Discharge Instructions  RECOMMENDATIONS MADE BY THE CONSULTANT AND ANY TEST RESULTS WILL BE SENT TO YOUR REFERRING PHYSICIAN.  Exam and discussion with Dr. Whitney Muse. Visit radiology on your way out today for bone survey. Referral to GI for screening colonoscopy. Return for lab work in office visit in 6 months.  Call the clinic for any questions or concerns.    Thank you for choosing [facility] at [facility] to provide your oncology and hematology care.  To afford each patient quality time with our provider, please arrive at least 15 minutes before your scheduled appointment time.    You need to re-schedule your appointment should you arrive 10 or more minutes late.  We strive to give you quality time with our providers, and arriving late affects you and other patients whose appointments are after yours.  Also, if you no show three or more times for appointments you may be dismissed from the clinic at the providers discretion.     Again, thank you for choosing [facility].  Our hope is that these requests will decrease the amount of time that you wait before being seen by our physicians.       _____________________________________________________________  Should you have questions after your visit to [facility], please contact our office at [phone] between the hours of 8:30 a.m. and 4:30 p.m.  Voicemails left after 4:30 p.m. will not be returned until the following business day.  For prescription refill requests, have your pharmacy contact our office.

## 2015-12-12 LAB — IGG, IGA, IGM
IgA: 226 mg/dL (ref 87–352)
IgG (Immunoglobin G), Serum: 1182 mg/dL (ref 700–1600)
IgM, Serum: 162 mg/dL (ref 26–217)

## 2015-12-14 LAB — KAPPA/LAMBDA LIGHT CHAINS
Kappa free light chain: 47.5 mg/L — ABNORMAL HIGH (ref 3.30–19.40)
Kappa, lambda light chain ratio: 1.86 — ABNORMAL HIGH (ref 0.26–1.65)
Lambda free light chains: 25.58 mg/L (ref 5.71–26.30)

## 2015-12-14 LAB — PROTEIN ELECTROPHORESIS, SERUM
A/G Ratio: 1.1 (ref 0.7–1.7)
Albumin ELP: 3.7 g/dL (ref 2.9–4.4)
Alpha-1-Globulin: 0.2 g/dL (ref 0.0–0.4)
Alpha-2-Globulin: 0.7 g/dL (ref 0.4–1.0)
Beta Globulin: 1.1 g/dL (ref 0.7–1.3)
Gamma Globulin: 1.3 g/dL (ref 0.4–1.8)
Globulin, Total: 3.3 g/dL (ref 2.2–3.9)
M-Spike, %: 0.4 g/dL — ABNORMAL HIGH
Total Protein ELP: 7 g/dL (ref 6.0–8.5)

## 2015-12-15 ENCOUNTER — Telehealth (HOSPITAL_COMMUNITY): Payer: Self-pay | Admitting: Emergency Medicine

## 2015-12-15 NOTE — Telephone Encounter (Signed)
Pt called and wanted results from bone survey, spoke with Dr Whitney Muse, bone survey is stable.  Pt notified

## 2015-12-16 LAB — IMMUNOFIXATION ELECTROPHORESIS
IgA: 236 mg/dL (ref 87–352)
IgG (Immunoglobin G), Serum: 1203 mg/dL (ref 700–1600)
IgM, Serum: 170 mg/dL (ref 26–217)
Total Protein ELP: 8.3 g/dL (ref 6.0–8.5)

## 2015-12-23 ENCOUNTER — Ambulatory Visit (INDEPENDENT_AMBULATORY_CARE_PROVIDER_SITE_OTHER): Payer: Medicare Other | Admitting: Pediatrics

## 2015-12-23 ENCOUNTER — Encounter: Payer: Self-pay | Admitting: Pediatrics

## 2015-12-23 VITALS — BP 122/71 | HR 80 | Temp 97.0°F | Ht 67.0 in | Wt 192.8 lb

## 2015-12-23 DIAGNOSIS — I10 Essential (primary) hypertension: Secondary | ICD-10-CM | POA: Diagnosis not present

## 2015-12-23 DIAGNOSIS — F411 Generalized anxiety disorder: Secondary | ICD-10-CM | POA: Diagnosis not present

## 2015-12-23 DIAGNOSIS — M7711 Lateral epicondylitis, right elbow: Secondary | ICD-10-CM | POA: Diagnosis not present

## 2015-12-23 MED ORDER — HYDROXYZINE HCL 10 MG PO TABS: 10.0000 mg | ORAL_TABLET | Freq: Three times a day (TID) | ORAL | Status: DC | PRN

## 2015-12-23 NOTE — Progress Notes (Signed)
Subjective:    Patient ID: [name], female    DOB: 06/18/1951, 65 y.o.   MRN: [id number]  CC: Joint Pain   HPI: [name] is a 65 y.o. female presenting for Joint Pain  Has pain in her R arm around the elbow. Has had this pain before Has been doing a lot of work around the house, family member recently discharged from hospital. Pain present off and on for several months. Sometimes wears a band on upper R forearm. Also feeling more anxious than usual during the day Recently seen by psychiatrist, no medication changes at that time.  Pt has been taking current meds as prescribed, no ambien. Takes trazodone sometimes for sleep, not having any anxiety at night, says has been sleeping fine. Sometimes feels crawling sensation all over arms/legs when feeling more anxious. Last Cr 10 days ago at baseline, 1.48, has h/o MGUS, bili level normal.  Also says she was hospitalized several weeks ago in [place] for dehydration, says she got fluids and was sent home. Can't see any of these records in the EMR system.   Depression screen [facility] 2/9 12/23/2015 10/20/2015 10/05/2015 09/17/2015 08/13/2015  Decreased Interest 2 3 0 3 3  Down, Depressed, Hopeless 3 3 - 3 3  PHQ - 2 Score 5 6 0 6 6  Altered sleeping 3 3 - 2 2  Tired, decreased energy 3 3 - 3 3  Change in appetite 0 2 - 3 3  Feeling bad or failure about yourself  0 0 - 3 0  Trouble concentrating 0 2 - 0 0  Moving slowly or fidgety/restless 0 3 - 0 1  Suicidal thoughts 0 0 - 1 0  PHQ-9 Score 11 19 - 18 15  Difficult doing work/chores Somewhat difficult Somewhat difficult - - -     Relevant past medical, surgical, family and social history reviewed and updated as indicated. Interim medical history since our last visit reviewed. Allergies and medications reviewed and updated.    ROS: Per HPI unless specifically indicated above  History  Smoking status  . Former Smoker -- 1.00 packs/day for 30 years  . Types:  Cigarettes  . Quit date: 11/13/2012  Smokeless tobacco  . Never Used    Past Medical History Patient Active Problem List   Diagnosis Date Noted  . COPD (chronic obstructive pulmonary disease) ([place]) 10/20/2015  . Osteoarthritis of right knee 10/20/2015  . Chronic kidney disease 10/06/2015  . Chronic diastolic congestive heart failure ([place]) 10/06/2015  . Acute psychosis associated with endocrine, metabolic, or cerebrovascular disorder 09/21/2015  . Hallucinations   . Vomiting 04/11/2015  . Metabolic acidosis [id number]  . Diarrhea 04/11/2015  . Nausea vomiting and diarrhea   . Gastroesophageal reflux disease without esophagitis 03/13/2015  . Neuropathy ([place]) 03/13/2015  . 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 Insomnia 03/25/2013  . Depression 03/25/2013  . Seizures ([place]) 03/25/2013  . Hyperlipidemia 03/25/2013  . 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 Essential hypertension 04/21/2010  . DOE (dyspnea on exertion) 04/21/2010  . CHEST PAIN-UNSPECIFIED 04/21/2010    Current Outpatient Prescriptions  Medication Sig Dispense Refill  . acetaminophen (TYLENOL) 500 MG tablet Take 1,000 mg by mouth every 6 (six) hours as needed for moderate pain.    [name] amLODipine (NORVASC) 10 MG tablet Take 1 tablet (10 mg total) by mouth daily. 30 tablet 5  . aspirin EC 81 MG tablet Take 81 mg by mouth daily.    . calcitRIOL (  ROCALTROL) 0.5 MCG capsule Take 0.5 mcg by mouth daily.     . calcium acetate (PHOSLO) 667 MG capsule Take 1 capsule by mouth 3 (three) times daily.    . cholecalciferol (VITAMIN D) 1000 UNITS tablet Take 1,000 Units by mouth daily.    [name] donepezil (ARICEPT) 10 MG tablet Take 1 tablet (10 mg total) by mouth at bedtime. 30 tablet 3  . DULoxetine (CYMBALTA) 60 MG capsule Take 60 mg by mouth daily.    . ferrous sulfate 325 (65 FE) MG tablet Take 325 mg by mouth daily with breakfast.    . fluticasone (FLONASE) 50 MCG/ACT nasal spray Place 2 sprays into both  nostrils daily. 16 g 2  . Fluticasone-Salmeterol (ADVAIR) 100-50 MCG/DOSE AEPB Inhale 1 puff into the lungs 2 (two) times daily. 1 each 3  . furosemide (LASIX) 20 MG tablet TAKE ONE TABLET BY MOUTH DAILY AS NEEDED 30 tablet 4  . gabapentin (NEURONTIN) 300 MG capsule Take 1 capsule (300 mg total) by mouth at bedtime. 30 capsule 1  . hydrALAZINE (APRESOLINE) 50 MG tablet TAKE 1 TABLET BY MOUTH THREE TIMES DAILY 90 tablet 2  . Ipratropium-Albuterol (COMBIVENT RESPIMAT) 20-100 MCG/ACT AERS respimat INHALE 2 PUFFS INTO THE LUNGS TWICE A DAY 4 g 4  . levETIRAcetam (KEPPRA) 500 MG tablet TAKE 1 TABLET BY MOUTH TWICE DAILY 60 tablet 2  . lidocaine (XYLOCAINE) 4 % external solution APPLY TOPICALLY THREE TIMES A DAY AS NEEDED    . metoprolol (LOPRESSOR) 50 MG tablet Take 1 tablet (50 mg total) by mouth 2 (two) times daily. 60 tablet 5  . pantoprazole (PROTONIX) 40 MG tablet Take 1 tablet (40 mg total) by mouth daily. 30 tablet 5  . pravastatin (PRAVACHOL) 40 MG tablet Take 1 tablet (40 mg total) by mouth daily. 90 tablet 3  . hydrOXYzine (ATARAX/VISTARIL) 10 MG tablet Take 1 tablet (10 mg total) by mouth 3 (three) times daily as needed. 30 tablet 1   No current facility-administered medications for this visit.       Objective:    BP 122/71 mmHg  Pulse 80  Temp(Src) 97 F (36.1 C) (Oral)  Ht 5\' 7"  (1.702 m)  Wt 192 lb 12.8 oz (87.454 kg)  BMI 30.19 kg/m2  Wt Readings from Last 3 Encounters:  12/23/15 192 lb 12.8 oz (87.454 kg)  12/11/15 196 lb 9.6 oz (89.177 kg)  12/01/15 201 lb 3.2 oz (91.264 kg)     Gen: NAD, alert, cooperative with exam, NCAT EYES: EOMI, no scleral injection or icterus ENT: OP without erythema LYMPH: no cervical LAD CV: NRRR, normal S1/S2, no murmur, distal pulses 2+ b/l Resp: CTABL, no wheezes, normal WOB Abd: +BS, soft, NTND.  Ext: No edema, warm Neuro: Alert and oriented MSK: tender over medial epicondyle of R elbow. Strength b/l UE with flex/ext normal. Raises  both arms to 160 degrees from her sides Psych: normal affect     Assessment & Plan:    Arnella was seen today for tennis elbow and follow up medical problems..  Diagnoses and all orders for this visit:  Tennis elbow, right Discussed stretches, using band will refer to PT. HgA1c last was 5.8 beginning of this year. -     Ambulatory referral to Physical Therapy  Anxiety state Symptoms not well controlled per pt. Recent labs at baseline, normal bilirubin level, creatinine at baseline for her, on gabapentin 300mg  once a day at night. I dont have a medical cause for the crawling sensation at  this time other than the anxiety. Pt on cymbalta. When on benzos she was having frequent falls and multiple hospitalizations last fall for altered mental status. Recommended she discuss with her psychiatrist changing the cymbalta or other alternate medication while continuing to benzodiazepine medications given her recent h/o AMS. Will try low dose atarax take no more than 3 times daily as needed. -     hydrOXYzine (ATARAX/VISTARIL) 10 MG tablet; Take 1 tablet (10 mg total) by mouth 3 (three) times daily as needed.  Essential hypertension Well controlled, continue current medications.   Follow up plan: Return in about 8 weeks (around 02/17/2016).  Assunta Found, MD [facility] 12/23/2015, 9:37 AM

## 2015-12-29 ENCOUNTER — Ambulatory Visit: Payer: Medicare Other | Attending: Pediatrics | Admitting: Physical Therapy

## 2015-12-29 DIAGNOSIS — G43709 Chronic migraine without aura, not intractable, without status migrainosus: Secondary | ICD-10-CM | POA: Diagnosis not present

## 2015-12-29 DIAGNOSIS — M25521 Pain in right elbow: Secondary | ICD-10-CM | POA: Diagnosis present

## 2015-12-29 NOTE — Therapy (Signed)
[facility] [place], [place], [address] Phone: [phone]   Fax:  [phone]  Physical Therapy Evaluation  Patient Details  Name: [name] MRN: [id number] Date of Birth: 1951-05-12 Referring Provider: Assunta Found MD.  Encounter Date: 12/29/2015      PT End of Session - 12/29/15 1843    Visit Number 1   Number of Visits 12   Date for PT Re-Evaluation 02/16/16   PT Start Time [id number]   PT Stop Time [id number]   PT Time Calculation (min) 50 min      Past Medical History  Diagnosis Date  . Seizures ([place])   . Chronic headaches   . Essential hypertension   . Osteoporosis   . Depression   . 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 URI (upper respiratory infection)   . CKD (chronic kidney disease) stage 3, GFR 30-59 ml/min   . Restrictive lung disease   . Hyperlipidemia   . Right leg weakness   . Encephalopathy     Past Surgical History  Procedure Laterality Date  . Breast reduction surgery    . Abdominal hysterectomy    . Lung lobectomy Right     Cyst  . Appendectomy    . Cholecystectomy    . Back surgery  2002    There were no vitals filed for this visit.  Visit Diagnosis:  Right elbow pain - Plan: PT plan of care cert/re-cert      Subjective Assessment - 12/29/15 1922    Subjective My right elbow has been hurting for 6 months.  I'm starting to feelpain in my left elbow also.   Limitations House hold activities   Patient Stated Goals Want to be able to use my right arm again without pain.   Currently in Pain? Yes   Pain Score 10-Worst pain ever   Pain Location Elbow   Pain Orientation Right   Pain Descriptors / Indicators Aching;Throbbing   Pain Onset More than a month ago   Pain Frequency Constant   Aggravating Factors  "Everything."   Pain Relieving Factors "Nothing."            [facility] PT Assessment - 12/29/15 0001    Assessment   Medical Diagnosis Right  Tennis elbow.   Referring Provider Assunta Found MD.   Onset Date/Surgical Date --  6 months.   Hand Dominance --  Right.   Precautions   Precaution Comments Lifting and carrying objects with right hand.   Restrictions   Weight Bearing Restrictions No   Balance Screen   Has the patient fallen in the past 6 months No   Has the patient had a decrease in activity level because of a fear of falling?  Yes   Is the patient reluctant to leave their home because of a fear of falling?  No   Home Ecologist residence   Prior Function   Level of Independence Independent   Cognition   Overall Cognitive Status Within Functional Limits for tasks assessed   Posture/Postural Control   Posture/Postural Control No significant limitations   ROM / Strength   AROM / PROM / Strength AROM;Strength   AROM   Overall AROM Comments Normal right elbow, forearm and wrist AROM.   Strength   Overall Strength Comments Right grip= 40# and left= 45#.   Palpation   Palpation comment Tender to palpation over right lateral epicondyle and proximal 1/3 of extensor musculature.  Special Tests    Special Tests --  (+) RT Tennis elbow test.   Ambulation/Gait   Gait Comments --  WNL.                   [facility] Adult PT Treatment/Exercise - 01-03-16 0001    Modalities   Modalities Electrical Stimulation;Moist Heat   Moist Heat Therapy   Number Minutes Moist Heat 20 Minutes   Moist Heat Location --  Right elbow.   Electrical Stimulation   Electrical Stimulation Location Right lateral epicondyle and proximal wrist extensor musculature.   Electrical Stimulation Action Pre-mod   Electrical Stimulation Parameters 80-150 HZ.x 20 minutes.   Electrical Stimulation Goals Pain                  PT Short Term Goals - 01-03-16 1931    PT SHORT TERM GOAL #1   Title Ind with HEP.   Time 2   Period Weeks   Status New           PT Long Term Goals - 01-03-2016 1932     PT LONG TERM GOAL #1   Title Perform ADL's with pain not > 3/10.   Time 6   Period Weeks   Status New   PT LONG TERM GOAL #2   Title Caryy 8# with right elbow pain not > 2-3/10.   Time 6   Period Weeks   Status New   PT LONG TERM GOAL #3   Title Increase right grip to 50#.   Time 6   Period Weeks   Status New               Plan - 2016-01-03 1926    Clinical Impression Statement The patient reports ongoing and worsenening right elbow pain over the last 6 months which she states is due to lifting and pulling ("overworking").  She rates her pain at a 10/10.  She states "everything" hurts and "nothing" helps.     Pt will benefit from skilled therapeutic intervention in order to improve on the following deficits Pain;Decreased activity tolerance   Rehab Potential Excellent   PT Frequency 2x / week   PT Duration 6 weeks   PT Treatment/Interventions ADLs/Self Care Home Management;Cryotherapy;Electrical Stimulation;Moist Heat;Therapeutic exercise;Therapeutic activities;Ultrasound;Patient/family education;Manual techniques   PT Next Visit Plan E'stim; Combo e'stim/U/S to right lateral epicondylar region and affected musculature; IASTM; isometric right wrist extension and progress to light strengthening.   Consulted and Agree with Plan of Care Patient          G-Codes - January 03, 2016 1934    Functional Assessment Tool Used FOTO....56% limitation.   Functional Limitation Carrying, moving and handling objects   Carrying, Moving and Handling Objects Current Status [phone]) At least 40 percent but less than 60 percent impaired, limited or restricted   Carrying, Moving and Handling Objects Goal Status [id number]) At least 1 percent but less than 20 percent impaired, limited or restricted       Problem List Patient Active Problem List   Diagnosis Date Noted  . COPD (chronic obstructive pulmonary disease) ([place]) 10/20/2015  . Osteoarthritis of right knee 10/20/2015  . Chronic kidney disease  10/06/2015  . Chronic diastolic congestive heart failure ([place]) 10/06/2015  . Acute psychosis associated with endocrine, metabolic, or cerebrovascular disorder 09/21/2015  . Hallucinations   . Vomiting 04/11/2015  . Metabolic acidosis [id number]  . Diarrhea 04/11/2015  . Nausea vomiting and diarrhea   . Gastroesophageal reflux disease without esophagitis 03/13/2015  .  Neuropathy ([place]) 03/13/2015  . 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 Insomnia 03/25/2013  . Depression 03/25/2013  . Seizures ([place]) 03/25/2013  . Hyperlipidemia 03/25/2013  . 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 Essential hypertension 04/21/2010  . DOE (dyspnea on exertion) 04/21/2010  . CHEST PAIN-UNSPECIFIED 04/21/2010    APPLEGATE, [place] MPT 12/29/2015, 7:41 PM  [facility] [address] [place], [place], [address] Phone: [phone]   Fax:  [phone]  Name: [name] MRN: [id number] Date of Birth: March 21, 1951

## 2016-01-04 ENCOUNTER — Other Ambulatory Visit: Payer: Self-pay | Admitting: Nurse Practitioner

## 2016-01-04 ENCOUNTER — Encounter: Payer: Self-pay | Admitting: Physical Therapy

## 2016-01-05 ENCOUNTER — Ambulatory Visit: Payer: Self-pay | Admitting: Physical Therapy

## 2016-01-05 DIAGNOSIS — F419 Anxiety disorder, unspecified: Secondary | ICD-10-CM | POA: Diagnosis not present

## 2016-01-07 ENCOUNTER — Encounter: Payer: Self-pay | Admitting: Physical Therapy

## 2016-01-07 ENCOUNTER — Ambulatory Visit: Payer: Medicare Other | Attending: Pediatrics | Admitting: Physical Therapy

## 2016-01-07 DIAGNOSIS — M25521 Pain in right elbow: Secondary | ICD-10-CM | POA: Insufficient documentation

## 2016-01-07 NOTE — Patient Instructions (Signed)
Wrist Extensor Stretch    Keeping elbow straight, grasp right/left hand and slowly bend wrist forward until stretch is felt. Hold _30___ seconds. Relax. Repeat __3__ times per set. Do __1__ sets per session. Do __3-4__ sessions per day.  Copyright  VHI. All rights reserved.

## 2016-01-07 NOTE — Therapy (Signed)
[facility] [place], [place], [address] Phone: [phone]   Fax:  [phone]  Physical Therapy Treatment  Patient Details  Name: [name] MRN: [id number] Date of Birth: 1951-05-17 Referring Provider: Assunta Found MD.  Encounter Date: 01/07/2016      PT End of Session - 01/07/16 1036    Visit Number 2   Number of Visits 12   Date for PT Re-Evaluation 02/16/16   PT Start Time 1036   PT Stop Time 1126   PT Time Calculation (min) 50 min   Activity Tolerance Patient tolerated treatment well   Behavior During Therapy [facility] for tasks assessed/performed      Past Medical History  Diagnosis Date  . Seizures ([place])   . Chronic headaches   . Essential hypertension   . Osteoporosis   . Depression   . 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 URI (upper respiratory infection)   . CKD (chronic kidney disease) stage 3, GFR 30-59 ml/min   . Restrictive lung disease   . Hyperlipidemia   . Right leg weakness   . Encephalopathy     Past Surgical History  Procedure Laterality Date  . Breast reduction surgery    . Abdominal hysterectomy    . Lung lobectomy Right     Cyst  . Appendectomy    . Cholecystectomy    . Back surgery  2002    There were no vitals filed for this visit.  Visit Diagnosis:  Right elbow pain      Subjective Assessment - 01/07/16 1035    Subjective Reports that R mid upper arm to hand is hurting today.   Limitations House hold activities   Patient Stated Goals Want to be able to use my right arm again without pain.   Currently in Pain? Yes   Pain Score 10-Worst pain ever   Pain Location Elbow   Pain Orientation Right   Pain Descriptors / Indicators Constant;Other (Comment)  "to the bone."   Pain Onset More than a month ago            [facility] PT Assessment - 01/07/16 0001    Assessment   Medical Diagnosis Right Tennis elbow.   Next MD Visit 8 weeks   Precautions   Precaution Comments Lifting and carrying objects with right hand.                     [facility] Adult PT Treatment/Exercise - 01/07/16 0001    Modalities   Modalities Ultrasound;Retail buyer Location Right lateral epicondyle and proximal wrist extensor musculature.   Electrical Stimulation Action Pre-Mod   Electrical Stimulation Parameters 80-150 Hz x15 min   Electrical Stimulation Goals Pain;Tone   Ultrasound   Ultrasound Location R lateral epicondyle   Ultrasound Parameters 0.8 w/cm2, 50%, 3.3 mhx x10 min   Ultrasound Goals Pain   Manual Therapy   Manual Therapy Myofascial release   Myofascial Release IASTW to R lateral epicondyle and proximal wrist extensors to decrease tightness and pain                PT Education - 01/07/16 1114    Education provided Yes   Education Details HEP- wrist extensors stretch   Person(s) Educated Patient   Methods Explanation;Demonstration;Verbal cues;Handout   Comprehension Verbalized understanding;Returned demonstration;Verbal cues required          PT Short Term Goals - 12/29/15 1931  PT SHORT TERM GOAL #1   Title Ind with HEP.   Time 2   Period Weeks   Status New           PT Long Term Goals - 12/29/15 1932    PT LONG TERM GOAL #1   Title Perform ADL's with pain not > 3/10.   Time 6   Period Weeks   Status New   PT LONG TERM GOAL #2   Title Caryy 8# with right elbow pain not > 2-3/10.   Time 6   Period Weeks   Status New   PT LONG TERM GOAL #3   Title Increase right grip to 50#.   Time 6   Period Weeks   Status New               Plan - 01/07/16 1115    Clinical Impression Statement Patient tolerated today's treatment well although she was experiencing increased R lateral epicondyle pain at beginning of treatment. Patient continues to experience increased R elbow pain with ADLs around home. TIghtness and nodules of  tightness noted during IASTW and MFR of R wrist extensors today but decreased following manual therapy. Patient experienced soreness directly over the R wrist extensors and posteriolateral elbow today per patient report. Normal modalities response noted following removal of the modalities. Patient accepted stretching HEP for wrist extensors to assist in decreasing tightness and pain. Goals are on-going at this time secondary to pain experienced by patient with actvities. Patient experienced R elbow feeling better following treatment with 7/10 pain per patient report.   Pt will benefit from skilled therapeutic intervention in order to improve on the following deficits Pain;Decreased activity tolerance   Rehab Potential Excellent   PT Frequency 2x / week   PT Duration 6 weeks   PT Treatment/Interventions ADLs/Self Care Home Management;Cryotherapy;Electrical Stimulation;Moist Heat;Therapeutic exercise;Therapeutic activities;Ultrasound;Patient/family education;Manual techniques   PT Next Visit Plan Continue modalities and IASTW to R lateral epicondyle as symptoms allow and begin strengthening as symptoms allow.   PT Home Exercise Plan HEP- wrist extensors stretch   Consulted and Agree with Plan of Care Patient        Problem List Patient Active Problem List   Diagnosis Date Noted  . COPD (chronic obstructive pulmonary disease) ([place]) 10/20/2015  . Osteoarthritis of right knee 10/20/2015  . Chronic kidney disease 10/06/2015  . Chronic diastolic congestive heart failure ([place]) 10/06/2015  . Acute psychosis associated with endocrine, metabolic, or cerebrovascular disorder 09/21/2015  . Hallucinations   . Vomiting 04/11/2015  . Metabolic acidosis [id number]  . Diarrhea 04/11/2015  . Nausea vomiting and diarrhea   . Gastroesophageal reflux disease without esophagitis 03/13/2015  . Neuropathy ([place]) 03/13/2015  . 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 Insomnia 03/25/2013  . Depression 03/25/2013  .  Seizures ([place]) 03/25/2013  . Hyperlipidemia 03/25/2013  . 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 Essential hypertension 04/21/2010  . DOE (dyspnea on exertion) 04/21/2010  . CHEST PAIN-UNSPECIFIED 04/21/2010    Wynelle Fanny, PTA 01/07/2016, 11:30 AM  [facility] [place], [place], [address] Phone: [phone]   Fax:  [phone]  Name: [name] MRN: [id number] Date of Birth: 1951/10/31

## 2016-01-12 ENCOUNTER — Encounter: Payer: Self-pay | Admitting: Physical Therapy

## 2016-01-12 ENCOUNTER — Ambulatory Visit: Payer: Medicare Other | Admitting: Physical Therapy

## 2016-01-12 DIAGNOSIS — M25521 Pain in right elbow: Secondary | ICD-10-CM | POA: Diagnosis not present

## 2016-01-12 NOTE — Therapy (Signed)
[facility] [place], [place], [address] Phone: [phone]   Fax:  [phone]  Physical Therapy Treatment  Patient Details  Name: [name] MRN: [id number] Date of Birth: 1951/10/07 Referring Provider: Assunta Found MD.  Encounter Date: 01/12/2016      PT End of Session - 01/12/16 1110    Visit Number 3   Number of Visits 12   Date for PT Re-Evaluation 02/16/16   PT Start Time 1031   PT Stop Time 1122   PT Time Calculation (min) 51 min   Activity Tolerance Patient tolerated treatment well   Behavior During Therapy [facility] for tasks assessed/performed      Past Medical History  Diagnosis Date  . Seizures ([place])   . Chronic headaches   . Essential hypertension   . Osteoporosis   . Depression   . 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 URI (upper respiratory infection)   . CKD (chronic kidney disease) stage 3, GFR 30-59 ml/min   . Restrictive lung disease   . Hyperlipidemia   . Right leg weakness   . Encephalopathy     Past Surgical History  Procedure Laterality Date  . Breast reduction surgery    . Abdominal hysterectomy    . Lung lobectomy Right     Cyst  . Appendectomy    . Cholecystectomy    . Back surgery  2002    There were no vitals filed for this visit.  Visit Diagnosis:  Right elbow pain      Subjective Assessment - 01/12/16 1041    Subjective patient reported no decrease in pain at this time   Limitations House hold activities   Patient Stated Goals Want to be able to use my right arm again without pain.   Currently in Pain? Yes   Pain Score 8    Pain Location Elbow   Pain Orientation Right   Pain Descriptors / Indicators Aching   Pain Onset More than a month ago   Pain Frequency Constant   Aggravating Factors  hurts all the time   Pain Relieving Factors nothing at this time                         [facility] Adult PT Treatment/Exercise -  01/12/16 0001    Moist Heat Therapy   Number Minutes Moist Heat 15 Minutes   Moist Heat Location Elbow   Electrical Stimulation   Electrical Stimulation Location Right lateral epicondyle and proximal wrist extensor musculature.   Electrical Stimulation Action premod   Electrical Stimulation Parameters 1-10hz    Electrical Stimulation Goals Pain;Tone   Ultrasound   Ultrasound Location rt lat elbow   Ultrasound Parameters 1.5w/cm2/50%/3.18mhz x83min   Ultrasound Goals Pain   Manual Therapy   Manual Therapy Myofascial release   Myofascial Release IASTW to R lateral epicondyle and proximal wrist extensors to decrease tightness and pain                  PT Short Term Goals - 12/29/15 1931    PT SHORT TERM GOAL #1   Title Ind with HEP.   Time 2   Period Weeks   Status New           PT Long Term Goals - 12/29/15 1932    PT LONG TERM GOAL #1   Title Perform ADL's with pain not > 3/10.   Time 6   Period Weeks  Status New   PT LONG TERM GOAL #2   Title Caryy 8# with right elbow pain not > 2-3/10.   Time 6   Period Weeks   Status New   PT LONG TERM GOAL #3   Title Increase right grip to 50#.   Time 6   Period Weeks   Status New               Plan - 01/12/16 1113    Clinical Impression Statement Patient progressing slowly due to pain in elbow, and has reported little relief thus far. Patient reported doing self stretch at home daily and today treatment felt some relief. Unable to meet any further goals due to pain deficit.   Pt will benefit from skilled therapeutic intervention in order to improve on the following deficits Pain;Decreased activity tolerance   Rehab Potential Excellent   PT Frequency 2x / week   PT Duration 6 weeks   PT Treatment/Interventions ADLs/Self Care Home Management;Cryotherapy;Electrical Stimulation;Moist Heat;Therapeutic exercise;Therapeutic activities;Ultrasound;Patient/family education;Manual techniques   PT Next Visit Plan  Continue modalities and IASTW to R lateral epicondyle as symptoms allow and begin strengthening as symptoms allow.   Consulted and Agree with Plan of Care Patient        Problem List Patient Active Problem List   Diagnosis Date Noted  . COPD (chronic obstructive pulmonary disease) ([place]) 10/20/2015  . Osteoarthritis of right knee 10/20/2015  . Chronic kidney disease 10/06/2015  . Chronic diastolic congestive heart failure ([place]) 10/06/2015  . Acute psychosis associated with endocrine, metabolic, or cerebrovascular disorder 09/21/2015  . Hallucinations   . Vomiting 04/11/2015  . Metabolic acidosis [id number]  . Diarrhea 04/11/2015  . Nausea vomiting and diarrhea   . Gastroesophageal reflux disease without esophagitis 03/13/2015  . Neuropathy ([place]) 03/13/2015  . 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 Insomnia 03/25/2013  . Depression 03/25/2013  . Seizures ([place]) 03/25/2013  . Hyperlipidemia 03/25/2013  . 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 Essential hypertension 04/21/2010  . DOE (dyspnea on exertion) 04/21/2010  . CHEST PAIN-UNSPECIFIED 04/21/2010    Phillips Climes, PTA 01/12/2016, 11:24 AM  [facility] [place], [place], [address] Phone: [phone]   Fax:  [phone]  Name: [name] MRN: [id number] Date of Birth: 28-Jan-1951

## 2016-01-13 ENCOUNTER — Ambulatory Visit: Payer: Medicare Other | Admitting: *Deleted

## 2016-01-14 ENCOUNTER — Ambulatory Visit: Payer: Medicare Other | Admitting: Physical Therapy

## 2016-01-14 DIAGNOSIS — M25521 Pain in right elbow: Secondary | ICD-10-CM

## 2016-01-14 NOTE — Therapy (Signed)
[facility] [place], [place], [address] Phone: [phone]   Fax:  [phone]  Physical Therapy Treatment  Patient Details  Name: [name] MRN: [id number] Date of Birth: 08-Apr-1951 Referring Provider: Assunta Found MD.  Encounter Date: 01/14/2016      PT End of Session - 01/14/16 1037    Visit Number 4   Number of Visits 12   Date for PT Re-Evaluation 02/16/16   PT Start Time [id number]   PT Stop Time 1131   PT Time Calculation (min) 54 min   Activity Tolerance Patient tolerated treatment well   Behavior During Therapy [facility] for tasks assessed/performed      Past Medical History  Diagnosis Date  . Seizures ([place])   . Chronic headaches   . Essential hypertension   . Osteoporosis   . Depression   . 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 URI (upper respiratory infection)   . CKD (chronic kidney disease) stage 3, GFR 30-59 ml/min   . Restrictive lung disease   . Hyperlipidemia   . Right leg weakness   . Encephalopathy     Past Surgical History  Procedure Laterality Date  . Breast reduction surgery    . Abdominal hysterectomy    . Lung lobectomy Right     Cyst  . Appendectomy    . Cholecystectomy    . Back surgery  2002    There were no vitals filed for this visit.  Visit Diagnosis:  Right elbow pain      Subjective Assessment - 01/14/16 1038    Subjective patient states that her elbow is a little better   Limitations House hold activities   Patient Stated Goals Want to be able to use my right arm again without pain.   Currently in Pain? Yes   Pain Score 7    Pain Location Elbow   Pain Orientation Right   Pain Descriptors / Indicators Aching   Pain Onset More than a month ago                         [facility] Adult PT Treatment/Exercise - 01/14/16 0001    Exercises   Exercises Wrist   Wrist Exercises   Forearm Supination AROM;10 reps;Seated   Forearm Pronation AROM;10 reps;Seated   Wrist Flexion AROM;20 reps;Seated   Wrist Extension AROM;20 reps;Seated   Modalities   Modalities Electrical Stimulation;Ultrasound;Moist Heat   Moist Heat Therapy   Number Minutes Moist Heat 15 Minutes   Moist Heat Location Elbow   Electrical Stimulation   Electrical Stimulation Location R lateral epicondyle and wrist extensors   Electrical Stimulation Action premod   Electrical Stimulation Parameters 80-150 Hz x 15 min to tolerance   Electrical Stimulation Goals Tone;Pain   Ultrasound   Ultrasound Location R lateral elbow and wrist extensors   Ultrasound Parameters 1.5 wcm2 3.3 mhz 50% x 10 min    Ultrasound Goals Pain   Manual Therapy   Manual Therapy Myofascial release;Soft tissue mobilization   Soft tissue mobilization to R forearm musculature with passive stretch into wrist extension   Myofascial Release to R med/lat common tendons                PT Education - 01/14/16 1230    Education provided Yes   Education Details recommended ice 3-4 x /day after stretching and AROM for wrist flex/ext.   Person(s) Educated Patient   Methods Explanation;Demonstration  Comprehension Verbalized understanding;Returned demonstration          PT Short Term Goals - 12/29/15 1931    PT SHORT TERM GOAL #1   Title Ind with HEP.   Time 2   Period Weeks   Status New           PT Long Term Goals - 12/29/15 1932    PT LONG TERM GOAL #1   Title Perform ADL's with pain not > 3/10.   Time 6   Period Weeks   Status New   PT LONG TERM GOAL #2   Title Caryy 8# with right elbow pain not > 2-3/10.   Time 6   Period Weeks   Status New   PT LONG TERM GOAL #3   Title Increase right grip to 50#.   Time 6   Period Weeks   Status New               Plan - 01/14/16 1232    Clinical Impression Statement Patient reports some improvements since last visit. She has tenderness throughout foream and responded well to treatment  reporting less pain and tenderness at end.   PT Next Visit Plan Continue modalities and IASTW to R lateral epicondyle as symptoms allow and begin strengthening as symptoms allow.        Problem List Patient Active Problem List   Diagnosis Date Noted  . COPD (chronic obstructive pulmonary disease) ([place]) 10/20/2015  . Osteoarthritis of right knee 10/20/2015  . Chronic kidney disease 10/06/2015  . Chronic diastolic congestive heart failure ([place]) 10/06/2015  . Acute psychosis associated with endocrine, metabolic, or cerebrovascular disorder 09/21/2015  . Hallucinations   . Vomiting 04/11/2015  . Metabolic acidosis [id number]  . Diarrhea 04/11/2015  . Nausea vomiting and diarrhea   . Gastroesophageal reflux disease without esophagitis 03/13/2015  . Neuropathy ([place]) 03/13/2015  . 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 Insomnia 03/25/2013  . Depression 03/25/2013  . Seizures ([place]) 03/25/2013  . Hyperlipidemia 03/25/2013  . 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 Essential hypertension 04/21/2010  . DOE (dyspnea on exertion) 04/21/2010  . CHEST PAIN-UNSPECIFIED 04/21/2010    Madelyn Flavors PT  01/14/2016, 12:36 PM  [facility] [address] [place], [place], [address] Phone: [phone]   Fax:  [phone]  Name: RANIQUE QUILLEN MRN: [id number] Date of Birth: December 21, 1950

## 2016-01-19 ENCOUNTER — Other Ambulatory Visit: Payer: Self-pay | Admitting: Nurse Practitioner

## 2016-01-19 ENCOUNTER — Encounter: Payer: Self-pay | Admitting: Physical Therapy

## 2016-01-21 ENCOUNTER — Ambulatory Visit: Payer: Medicare Other | Admitting: Physical Therapy

## 2016-01-21 DIAGNOSIS — M25521 Pain in right elbow: Secondary | ICD-10-CM

## 2016-01-21 NOTE — Therapy (Signed)
[facility] [place], [place], [address] Phone: [phone]   Fax:  [phone]  Physical Therapy Treatment  Patient Details  Name: [name] MRN: [id number] Date of Birth: Jul 23, 1951 Referring Provider: Assunta Found MD.  Encounter Date: 01/21/2016      PT End of Session - 01/21/16 1021    Visit Number 5   Number of Visits 12   Date for PT Re-Evaluation 02/16/16   PT Start Time 1029   PT Stop Time 1115   PT Time Calculation (min) 46 min   Activity Tolerance Patient tolerated treatment well   Behavior During Therapy [facility] for tasks assessed/performed      Past Medical History  Diagnosis Date  . Seizures ([place])   . Chronic headaches   . Essential hypertension   . Osteoporosis   . Depression   . 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 URI (upper respiratory infection)   . CKD (chronic kidney disease) stage 3, GFR 30-59 ml/min   . Restrictive lung disease   . Hyperlipidemia   . Right leg weakness   . Encephalopathy     Past Surgical History  Procedure Laterality Date  . Breast reduction surgery    . Abdominal hysterectomy    . Lung lobectomy Right     Cyst  . Appendectomy    . Cholecystectomy    . Back surgery  2002    There were no vitals filed for this visit.  Visit Diagnosis:  Right elbow pain - Plan: PT plan of care cert/re-cert      Subjective Assessment - 01/21/16 1032    Subjective Patient states that she had a great weekend with no pain, but today pain is back at a 10 right in the bone. Pain had returned on Monday. She reports compliance with TE and ice multiple times per day.   Patient Stated Goals Want to be able to use my right arm again without pain.   Currently in Pain? Yes   Pain Score 10-Worst pain ever   Pain Location Elbow   Pain Orientation Right   Pain Descriptors / Indicators Aching   Pain Onset More than a month ago                          [facility] Adult PT Treatment/Exercise - 01/21/16 0001    Elbow Exercises   Forearm Supination Strengthening;Right;Bar weights/barbell  2#; 30 reps   Forearm Pronation Strengthening;Right;Bar weights/barbell  2#; 30 reps   Wrist Flexion Strengthening;Right;Bar weights/barbell  2# 30 reps   Wrist Extension Strengthening;Right;Bar weights/barbell  2# 30 reps   Wrist Exercises   Other wrist exercises putty squeezes yellow   Modalities   Modalities Iontophoresis;Ultrasound   Ultrasound   Ultrasound Location R elbow   Ultrasound Parameters 0.5 wcm2 20% duty x 10 min 3.3. mhz   Ultrasound Goals Pain   Iontophoresis   Type of Iontophoresis Dexamethasone   Location R elbow   Dose 2.0 ml   Time 8   Manual Therapy   Manual Therapy Soft tissue mobilization   Soft tissue mobilization to R forearm                PT Education - 01/21/16 1122    Education provided Yes   Education Details HEP   Person(s) Educated Patient   Methods Explanation;Demonstration;Handout   Comprehension Verbalized understanding;Returned demonstration          PT  Short Term Goals - 01/21/16 1035    PT SHORT TERM GOAL #1   Title Ind with HEP.   Time 2   Period Weeks   Status Achieved           PT Long Term Goals - 01/21/16 1035    PT LONG TERM GOAL #1   Title Perform ADL's with pain not > 3/10.   Time 6   Period Weeks   Status On-going   PT LONG TERM GOAL #2   Title Caryy 8# with right elbow pain not > 2-3/10.   Time 6   Status On-going   PT LONG TERM GOAL #3   Title Increase right grip to 50#.  40# 01/21/16   Time 6   Period Weeks   Status On-going               Plan - 01/21/16 1123    Clinical Impression Statement Patient is making progress with elbow pain, experiencing two days without pain. However, pain continues to extreme levels at times. She responded well to therex today reporting no pain at end of treatment (10/10 at start).  Patient would likely benefit from iontophoresis with 4mg /ml dexamethasone to help achieve LTGs which are ongoing.   Pt will benefit from skilled therapeutic intervention in order to improve on the following deficits Pain;Decreased activity tolerance   Rehab Potential Excellent   PT Frequency 2x / week   PT Duration 6 weeks   PT Treatment/Interventions ADLs/Self Care Home Management;Cryotherapy;Electrical Stimulation;Moist Heat;Therapeutic exercise;Therapeutic activities;Ultrasound;Patient/family education;Manual techniques;Iontophoresis 4mg /ml Dexamethasone   PT Next Visit Plan Assess ionto. Continue with 2/6 ionto patch and [place] prn. Also continue  IASTW to R lateral epicondyle as symptoms allow and strengthening.   PT Home Exercise Plan putty squeeze for grip   Consulted and Agree with Plan of Care Patient        Problem List Patient Active Problem List   Diagnosis Date Noted  . COPD (chronic obstructive pulmonary disease) ([place]) 10/20/2015  . Osteoarthritis of right knee 10/20/2015  . Chronic kidney disease 10/06/2015  . Chronic diastolic congestive heart failure ([place]) 10/06/2015  . Acute psychosis associated with endocrine, metabolic, or cerebrovascular disorder 09/21/2015  . Hallucinations   . Vomiting 04/11/2015  . Metabolic acidosis [id number]  . Diarrhea 04/11/2015  . Nausea vomiting and diarrhea   . Gastroesophageal reflux disease without esophagitis 03/13/2015  . Neuropathy ([place]) 03/13/2015  . 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 Insomnia 03/25/2013  . Depression 03/25/2013  . Seizures ([place]) 03/25/2013  . Hyperlipidemia 03/25/2013  . 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 Essential hypertension 04/21/2010  . DOE (dyspnea on exertion) 04/21/2010  . CHEST PAIN-UNSPECIFIED 04/21/2010    Madelyn Flavors PT  01/21/2016, 11:34 AM  [facility] [address] [place], [place], [address] Phone: [phone]    Fax:  [phone]  Name: VALENTYNA HAHNE MRN: [id number] Date of Birth: [birth year]

## 2016-01-25 ENCOUNTER — Other Ambulatory Visit: Payer: Self-pay | Admitting: Pediatrics

## 2016-01-25 ENCOUNTER — Ambulatory Visit: Payer: Medicare Other | Admitting: Nurse Practitioner

## 2016-01-26 ENCOUNTER — Encounter: Payer: Self-pay | Admitting: Nurse Practitioner

## 2016-01-26 ENCOUNTER — Ambulatory Visit: Payer: Medicare Other | Admitting: *Deleted

## 2016-01-26 ENCOUNTER — Ambulatory Visit (INDEPENDENT_AMBULATORY_CARE_PROVIDER_SITE_OTHER): Payer: Medicare Other | Admitting: Nurse Practitioner

## 2016-01-26 VITALS — BP 112/70 | HR 73 | Temp 97.0°F | Ht 67.0 in | Wt 191.0 lb

## 2016-01-26 DIAGNOSIS — M25521 Pain in right elbow: Secondary | ICD-10-CM | POA: Diagnosis not present

## 2016-01-26 DIAGNOSIS — G43919 Migraine, unspecified, intractable, without status migrainosus: Secondary | ICD-10-CM | POA: Diagnosis not present

## 2016-01-26 MED ORDER — CYCLOBENZAPRINE HCL 5 MG PO TABS: 5.0000 mg | ORAL_TABLET | Freq: Three times a day (TID) | ORAL | Status: DC | PRN

## 2016-01-26 MED ORDER — KETOROLAC TROMETHAMINE 60 MG/2ML IM SOLN
60.0000 mg | Freq: Once | INTRAMUSCULAR | Status: AC
  Administered 2016-01-26: 60 mg via INTRAMUSCULAR

## 2016-01-26 NOTE — Telephone Encounter (Signed)
Last seen 12/23/15  Dr Evette Doffing  PCP  MMM

## 2016-01-26 NOTE — Progress Notes (Signed)
   Subjective:    Patient ID: [name], female    DOB: 1951-02-20, 65 y.o.   MRN: [id number]  HPI Patient in today c/o headache that stated 1 week ago- rates pain 10/10- constant- has tried tylenol no relief. Has history of migraines but has nit had for awhile until now. SLight blurred vision. Pain is  On right side only- starts in temple and radiates to back of head and down neck. Not the worse headache she has ever had    Review of Systems  Eyes: Positive for photophobia.  Respiratory: Negative.   Cardiovascular: Negative.   Gastrointestinal: Positive for nausea. Negative for vomiting.  Neurological: Positive for headaches.  Psychiatric/Behavioral: Negative.   All other systems reviewed and are negative.      Objective:   Physical Exam  Constitutional: She is oriented to person, place, and time. She appears well-developed and well-nourished.  HENT:  Right Ear: External ear normal.  Left Ear: External ear normal.  Mouth/Throat: Oropharynx is clear and moist.  Eyes: Conjunctivae and EOM are normal. Pupils are equal, round, and reactive to light.  Neck: Normal range of motion. Neck supple.  Cardiovascular: Normal rate, regular rhythm and normal heart sounds.   Pulmonary/Chest: Effort normal and breath sounds normal.  Musculoskeletal:  Slight right neck pain when turning head to the left  Neurological: She is alert and oriented to person, place, and time. She has normal reflexes. No cranial nerve deficit.  Skin: Skin is warm.  Psychiatric: She has a normal mood and affect. Her behavior is normal. Judgment and thought content normal.   BP 112/70 mmHg  Pulse 73  Temp(Src) 97 F (36.1 C) (Oral)  Ht 5\' 7"  (1.702 m)  Wt 191 lb (86.637 kg)  BMI 29.91 kg/m2       Assessment & Plan:  1. Intractable migraine without status migrainosus, unspecified migraine type Rest Avoid caffeine RTO prn Keep headache diary - cyclobenzaprine (FLEXERIL) 5 MG tablet; Take 1 tablet (5  mg total) by mouth 3 (three) times daily as needed for muscle spasms.  Dispense: 30 tablet; Refill: 1 - ketorolac (TORADOL) injection 60 mg; Inject 2 mLs (60 mg total) into the muscle once.  Mary-Margaret Hassell Done, FNP

## 2016-01-26 NOTE — Therapy (Signed)
[facility] [place], [place], [address] Phone: [phone]   Fax:  [phone]  Physical Therapy Treatment  Patient Details  Name: [name] MRN: [id number] Date of Birth: [birth year] Referring Provider: Assunta Found MD.  Encounter Date: 01/26/2016      PT End of Session - 01/26/16 1006    Visit Number 6   Number of Visits 12   Date for PT Re-Evaluation 02/16/16   PT Start Time 1000   PT Stop Time 1051   PT Time Calculation (min) 51 min      Past Medical History  Diagnosis Date  . Seizures ([place])   . Chronic headaches   . Essential hypertension   . Osteoporosis   . Depression   . 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 URI (upper respiratory infection)   . CKD (chronic kidney disease) stage 3, GFR 30-59 ml/min   . Restrictive lung disease   . Hyperlipidemia   . Right leg weakness   . Encephalopathy     Past Surgical History  Procedure Laterality Date  . Breast reduction surgery    . Abdominal hysterectomy    . Lung lobectomy Right     Cyst  . Appendectomy    . Cholecystectomy    . Back surgery  2002    There were no vitals filed for this visit.  Visit Diagnosis:  Right elbow pain      Subjective Assessment - 01/26/16 1003    Subjective 15 minutes late. PT is helping my RT elbow is feeling better 7/10 at times.   Limitations House hold activities   Patient Stated Goals Want to be able to use my right arm again without pain.   Currently in Pain? Yes   Pain Score 7    Pain Location Elbow   Pain Orientation Right   Pain Descriptors / Indicators Aching   Pain Onset More than a month ago   Pain Frequency Constant                         [facility] Adult PT Treatment/Exercise - 01/26/16 0001    Exercises   Exercises Wrist   Wrist Exercises   Forearm Supination Strengthening;Right;Bar weights/barbell  2#; 30 reps   Forearm Pronation  Strengthening;Right;Bar weights/barbell  2#; 30 reps   Wrist Flexion Strengthening;Right;Bar weights/barbell  2# 30 reps   Wrist Extension Strengthening;Right;Bar weights/barbell  2# 30 reps   Moist Heat Therapy   Moist Heat Location Elbow   Electrical Stimulation   Electrical Stimulation Location R lateral epicondyle and wrist extensors   Electrical Stimulation Action IFC   Electrical Stimulation Parameters 80-150  x56mins   Electrical Stimulation Goals Tone;Pain   Ultrasound   Ultrasound Location RT elbow   Ultrasound Parameters  Combo 1 w/cm2 x 8 mins , 3.74mhz   Ultrasound Goals Pain   Iontophoresis   Type of Iontophoresis Dexamethasone   Location R elbow   Dose 2.0 ml   Time 8                  PT Short Term Goals - 01/21/16 1035    PT SHORT TERM GOAL #1   Title Ind with HEP.   Time 2   Period Weeks   Status Achieved           PT Long Term Goals - 01/21/16 1035    PT LONG TERM GOAL #1   Title Perform  ADL's with pain not > 3/10.   Time 6   Period Weeks   Status On-going   PT LONG TERM GOAL #2   Title Caryy 8# with right elbow pain not > 2-3/10.   Time 6   Status On-going   PT LONG TERM GOAL #3   Title Increase right grip to 50#.  40# 01/21/16   Time 6   Period Weeks   Status On-going               Plan - 01/26/16 1028    Clinical Impression Statement Pt did fairly well today with Rx to RT elbow. She continues to make progress and feels that PT is helping with less pain. She did well with [place] combo and IONTO patch. She was still able to perform all exs again with minimal complaints.[name] LTGs are ongoing   Pt will benefit from skilled therapeutic intervention in order to improve on the following deficits Pain;Decreased activity tolerance   Rehab Potential Excellent   PT Frequency 2x / week   PT Duration 6 weeks   PT Next Visit Plan Assess ionto. Continue with 3/6 ionto patch and [place] prn. Also continue  IASTW to R lateral epicondyle as symptoms  allow and strengthening.   PT Home Exercise Plan putty squeeze for grip   Consulted and Agree with Plan of Care Patient        Problem List Patient Active Problem List   Diagnosis Date Noted  . COPD (chronic obstructive pulmonary disease) ([place]) 10/20/2015  . Osteoarthritis of right knee 10/20/2015  . Chronic kidney disease 10/06/2015  . Chronic diastolic congestive heart failure ([place]) 10/06/2015  . Acute psychosis associated with endocrine, metabolic, or cerebrovascular disorder 09/21/2015  . Hallucinations   . Vomiting 04/11/2015  . Metabolic acidosis Q000111Q  . Diarrhea 04/11/2015  . Nausea vomiting and diarrhea   . Gastroesophageal reflux disease without esophagitis 03/13/2015  . Neuropathy ([place]) 03/13/2015  . 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 Insomnia 03/25/2013  . Depression 03/25/2013  . Seizures ([place]) 03/25/2013  . Hyperlipidemia 03/25/2013  . 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 Essential hypertension 04/21/2010  . DOE (dyspnea on exertion) 04/21/2010  . CHEST PAIN-UNSPECIFIED 04/21/2010    Novah Nessel,CHRIS, PTA 01/26/2016, 4:28 PM  [facility] [place], [place], [address] Phone: [phone]   Fax:  [phone]  Name: [name] MRN: [id number] Date of Birth: 1951/09/20

## 2016-01-26 NOTE — Patient Instructions (Signed)

## 2016-01-28 ENCOUNTER — Ambulatory Visit: Payer: Medicare Other | Attending: Pediatrics | Admitting: Physical Therapy

## 2016-01-28 ENCOUNTER — Encounter: Payer: Self-pay | Admitting: Physical Therapy

## 2016-01-28 DIAGNOSIS — M25521 Pain in right elbow: Secondary | ICD-10-CM | POA: Insufficient documentation

## 2016-01-28 NOTE — Therapy (Signed)
[facility] [place], [place], [address] Phone: [phone]   Fax:  [phone]  Physical Therapy Treatment  Patient Details  Name: [name] MRN: [id number] Date of Birth: February 14, 1951 Referring Provider: Assunta Found MD.  Encounter Date: 01/28/2016      PT End of Session - 01/28/16 1108    Visit Number 7   Number of Visits 12   Date for PT Re-Evaluation 02/16/16   PT Start Time 1032   PT Stop Time 1127   PT Time Calculation (min) 55 min   Activity Tolerance Patient tolerated treatment well   Behavior During Therapy [facility] for tasks assessed/performed      Past Medical History  Diagnosis Date  . Seizures ([place])   . Chronic headaches   . Essential hypertension   . Osteoporosis   . Depression   . 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 URI (upper respiratory infection)   . CKD (chronic kidney disease) stage 3, GFR 30-59 ml/min   . Restrictive lung disease   . Hyperlipidemia   . Right leg weakness   . Encephalopathy     Past Surgical History  Procedure Laterality Date  . Breast reduction surgery    . Abdominal hysterectomy    . Lung lobectomy Right     Cyst  . Appendectomy    . Cholecystectomy    . Back surgery  2002    There were no vitals filed for this visit.  Visit Diagnosis:  Right elbow pain      Subjective Assessment - 01/28/16 1040    Subjective Patient had no complaints after last treatment and tolerated ionto great per patient   Limitations House hold activities   Patient Stated Goals Want to be able to use my right arm again without pain.   Currently in Pain? Yes   Pain Score 5    Pain Location Elbow   Pain Orientation Right   Pain Descriptors / Indicators Sore   Pain Onset More than a month ago   Pain Frequency Constant   Aggravating Factors  using UE   Pain Relieving Factors at rest                         [facility] Adult PT  Treatment/Exercise - 01/28/16 0001    Elbow Exercises   Forearm Supination Strengthening;Right;Bar weights/barbell   Forearm Pronation Strengthening;Right;Bar weights/barbell   Wrist Flexion Strengthening;Right;Bar weights/barbell   Wrist Extension Strengthening;Right;Bar weights/barbell   Wrist Exercises   Wrist Radial Deviation Strengthening;Right  2# 3x10   Wrist Ulnar Deviation Strengthening;Right  2# 3x10   Moist Heat Therapy   Number Minutes Moist Heat 15 Minutes   Moist Heat Location Elbow   Electrical Stimulation   Electrical Stimulation Location R lateral epicondyle and wrist extensors   Electrical Stimulation Action premod   Electrical Stimulation Parameters 1-10hz    Electrical Stimulation Goals Tone;Pain   Ultrasound   Ultrasound Location rt elbow   Ultrasound Parameters 1.0w/cm2/50%/3.28mhzx10min   Ultrasound Goals Pain   Iontophoresis   Type of Iontophoresis Dexamethasone   Location R elbow   Dose 2.0 ml 3of 6   Time 8                  PT Short Term Goals - 01/21/16 1035    PT SHORT TERM GOAL #1   Title Ind with HEP.   Time 2   Period Weeks   Status Achieved  PT Long Term Goals - 01/21/16 1035    PT LONG TERM GOAL #1   Title Perform ADL's with pain not > 3/10.   Time 6   Period Weeks   Status On-going   PT LONG TERM GOAL #2   Title Caryy 8# with right elbow pain not > 2-3/10.   Time 6   Status On-going   PT LONG TERM GOAL #3   Title Increase right grip to 50#.  40# 01/21/16   Time 6   Period Weeks   Status On-going               Plan - 01/28/16 1120    Clinical Impression Statement Patient progressing with all activities. Patient feels 50% better overall and has less pain overall. patient responsing well to treatment thus far. patient reported doing HEP daily. Unable to meet any further goals due to pain deficits.   Pt will benefit from skilled therapeutic intervention in order to improve on the following deficits  Pain;Decreased activity tolerance   Rehab Potential Excellent   PT Frequency 2x / week   PT Duration 6 weeks   PT Treatment/Interventions ADLs/Self Care Home Management;Cryotherapy;Electrical Stimulation;Moist Heat;Therapeutic exercise;Therapeutic activities;Ultrasound;Patient/family education;Manual techniques;Iontophoresis 4mg /ml Dexamethasone   PT Next Visit Plan Assess ionto. Continue with 4/6 ionto patch and [place] prn. Also continue  IASTW to R lateral epicondyle as symptoms allow and strengthening.   Consulted and Agree with Plan of Care Patient        Problem List Patient Active Problem List   Diagnosis Date Noted  . COPD (chronic obstructive pulmonary disease) ([place]) 10/20/2015  . Osteoarthritis of right knee 10/20/2015  . Chronic kidney disease 10/06/2015  . Chronic diastolic congestive heart failure ([place]) 10/06/2015  . Acute psychosis associated with endocrine, metabolic, or cerebrovascular disorder 09/21/2015  . Hallucinations   . Vomiting 04/11/2015  . Metabolic acidosis Q000111Q  . Diarrhea 04/11/2015  . Nausea vomiting and diarrhea   . Gastroesophageal reflux disease without esophagitis 03/13/2015  . Neuropathy ([place]) 03/13/2015  . 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 Insomnia 03/25/2013  . Depression 03/25/2013  . Seizures ([place]) 03/25/2013  . Hyperlipidemia 03/25/2013  . 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 Essential hypertension 04/21/2010  . DOE (dyspnea on exertion) 04/21/2010  . CHEST PAIN-UNSPECIFIED 04/21/2010    Phillips Climes, PTA 01/28/2016, 11:27 AM  [facility] [place], [place], [address] Phone: [phone]   Fax:  [phone]  Name: [name] MRN: [id number] Date of Birth: [birth year]

## 2016-02-02 ENCOUNTER — Encounter: Payer: Self-pay | Admitting: Physical Therapy

## 2016-02-04 ENCOUNTER — Encounter: Payer: Self-pay | Admitting: Physical Therapy

## 2016-02-04 ENCOUNTER — Ambulatory Visit: Payer: Medicare Other | Admitting: Physical Therapy

## 2016-02-04 DIAGNOSIS — M25521 Pain in right elbow: Secondary | ICD-10-CM

## 2016-02-04 NOTE — Therapy (Signed)
[facility] [place], [place], [address] Phone: [phone]   Fax:  [phone]  Physical Therapy Treatment  Patient Details  Name: [name] MRN: [id number] Date of Birth: July 01, 1951 Referring Provider: Assunta Found MD.  Encounter Date: 02/04/2016      PT End of Session - 02/04/16 1135    Visit Number 8   Number of Visits 12   Date for PT Re-Evaluation 02/16/16   PT Start Time 1114   PT Stop Time 1158   PT Time Calculation (min) 44 min   Activity Tolerance Patient tolerated treatment well   Behavior During Therapy [facility] for tasks assessed/performed      Past Medical History  Diagnosis Date  . Seizures ([place])   . Chronic headaches   . Essential hypertension   . Osteoporosis   . Depression   . 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 URI (upper respiratory infection)   . CKD (chronic kidney disease) stage 3, GFR 30-59 ml/min   . Restrictive lung disease   . Hyperlipidemia   . Right leg weakness   . Encephalopathy     Past Surgical History  Procedure Laterality Date  . Breast reduction surgery    . Abdominal hysterectomy    . Lung lobectomy Right     Cyst  . Appendectomy    . Cholecystectomy    . Back surgery  2002    There were no vitals filed for this visit.  Visit Diagnosis:  Right elbow pain      Subjective Assessment - 02/04/16 1122    Subjective Patient had no complaints after last treatment and continues to tolerate ionto great per patient   Limitations House hold activities   Patient Stated Goals Want to be able to use my right arm again without pain.   Currently in Pain? No/denies                         [facility] Adult PT Treatment/Exercise - 02/04/16 0001    Elbow Exercises   Forearm Supination Strengthening;Right;Bar weights/barbell  2# 3x10   Theraband Level (Supination) --  with hammer x30   Forearm Pronation Strengthening;Right;Bar  weights/barbell  2# 3x10   Wrist Flexion Strengthening;Right;Bar weights/barbell  2# 3x10   Wrist Extension Strengthening;Right;Bar weights/barbell  2# 3x10   Theraband Level (Wrist Extension) --  flexbar twist x30   Wrist Extension Limitations UBE x25mn    Wrist Exercises   Wrist Radial Deviation Strengthening;Right  2# 3x10   Wrist Ulnar Deviation Strengthening;Right  2# 3x10   Other wrist exercises bicep curl 3# 2x10   Moist Heat Therapy   Number Minutes Moist Heat 15 Minutes   Moist Heat Location Elbow   Electrical Stimulation   Electrical Stimulation Location R lateral epicondyle and wrist extensors   Electrical Stimulation Action premod   Electrical Stimulation Parameters 1-10hz   Electrical Stimulation Goals Tone;Pain   Iontophoresis   Type of Iontophoresis Dexamethasone   Location R elbow   Dose 2.0 ml 4 of 6   Time 8                  PT Short Term Goals - 01/21/16 1035    PT SHORT TERM GOAL #1   Title Ind with HEP.   Time 2   Period Weeks   Status Achieved           PT Long Term Goals - 02/04/16 1125  PT LONG TERM GOAL #1   Title Perform ADL's with pain not > 3/10.   Time 6   Period Weeks   Status Achieved  3/10 (02/04/16)   PT LONG TERM GOAL #2   Title Caryy 8# with right elbow pain not > 2-3/10.   Time 6   Status On-going   PT LONG TERM GOAL #3   Title Increase right grip to 50#.   Time 6   Period Weeks   Status On-going  43# (02/04/16)               Plan - 02/04/16 1138    Clinical Impression Statement Patient progressing with all activities. Patient has had pain level at 3/10 at most with ADL's and activity. Patient has improved grip strength to 43# today. Patient is progressing with elbow and wrist exercises with no difficulty. Pateint met LTG #1 today and grip strength ongoing.    Pt will benefit from skilled therapeutic intervention in order to improve on the following deficits Pain;Decreased activity tolerance   Rehab  Potential Excellent   PT Frequency 2x / week   PT Duration 6 weeks   PT Treatment/Interventions ADLs/Self Care Home Management;Cryotherapy;Electrical Stimulation;Moist Heat;Therapeutic exercise;Therapeutic activities;Ultrasound;Patient/family education;Manual techniques;Iontophoresis 60m/ml Dexamethasone   PT Next Visit Plan Continue with 5/6 ionto patch and  strengthening (MD. VEvette Doffing3/22/17)   Consulted and Agree with Plan of Care Patient        Problem List Patient Active Problem List   Diagnosis Date Noted  . COPD (chronic obstructive pulmonary disease) ([place] 10/20/2015  . Osteoarthritis of right knee 10/20/2015  . Chronic kidney disease 10/06/2015  . Chronic diastolic congestive heart failure ([place] 10/06/2015  . Acute psychosis associated with endocrine, metabolic, or cerebrovascular disorder 09/21/2015  . Hallucinations   . Vomiting 04/11/2015  . Metabolic acidosis 011/91/4782 . Diarrhea 04/11/2015  . Nausea vomiting and diarrhea   . Gastroesophageal reflux disease without esophagitis 03/13/2015  . Neuropathy ([place] 03/13/2015  . 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 Insomnia 03/25/2013  . Depression 03/25/2013  . Seizures ([place] 03/25/2013  . Hyperlipidemia 03/25/2013  . 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 Essential hypertension 04/21/2010  . DOE (dyspnea on exertion) 04/21/2010  . CHEST PAIN-UNSPECIFIED 04/21/2010    DPhillips Climes3/07/2016, 12:02 PM  [facility] [address]Phone: [phone]  Fax:  [phone] Name: KJALEIGH MCCROSKEYMRN: [id number] of Birth: 906/07/1951

## 2016-02-09 ENCOUNTER — Telehealth: Payer: Self-pay | Admitting: *Deleted

## 2016-02-09 ENCOUNTER — Ambulatory Visit: Payer: Medicare Other | Admitting: Physical Therapy

## 2016-02-09 ENCOUNTER — Other Ambulatory Visit: Payer: Self-pay | Admitting: Nurse Practitioner

## 2016-02-09 DIAGNOSIS — M25521 Pain in right elbow: Secondary | ICD-10-CM

## 2016-02-09 NOTE — Telephone Encounter (Signed)
Patient called stating that she is constantly having a headache everyday going down the side of her neck. The muscle relaxers are not helping.

## 2016-02-09 NOTE — Telephone Encounter (Signed)
Has she followed up with her meurologist

## 2016-02-09 NOTE — Therapy (Addendum)
[facility] [place], [place], [address] Phone: [phone]   Fax:  [phone]  Physical Therapy Treatment  Patient Details  Name: LEVAEH VICE MRN: [id number] Date of Birth: January 28, 1951 Referring Provider: Assunta Found MD.  Encounter Date: 02/09/2016    Past Medical History:  Diagnosis Date  . Chronic headaches   . CKD (chronic kidney disease) stage 3, GFR 30-59 ml/min   . Depression   . Encephalopathy   . Essential hypertension   . Hyperlipidemia   . 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 Osteoporosis   . Restrictive lung disease   . Right leg weakness   . Seizures [facility])    first sz age 2, last seizure Aug 2017  . URI (upper respiratory infection)     Past Surgical History:  Procedure Laterality Date  . ABDOMINAL HYSTERECTOMY    . APPENDECTOMY    . BACK SURGERY  2002  . BREAST REDUCTION SURGERY    . CHOLECYSTECTOMY    . LUNG LOBECTOMY Right    Cyst    There were no vitals filed for this visit.  Visit Diagnosis:  Right elbow pain                                 PT Short Term Goals - 01/21/16 1035      PT SHORT TERM GOAL #1   Title Ind with HEP.   Time 2   Period Weeks   Status Achieved           PT Long Term Goals - 02/04/16 1125      PT LONG TERM GOAL #1   Title Perform ADL's with pain not > 3/10.   Time 6   Period Weeks   Status Achieved  3/10 (02/04/16)     PT LONG TERM GOAL #2   Title Caryy 8# with right elbow pain not > 2-3/10.   Time 6   Status On-going     PT LONG TERM GOAL #3   Title Increase right grip to 50#.   Time 6   Period Weeks   Status On-going  43# (02/04/16)               Problem List Patient Active Problem List   Diagnosis Date Noted  . Memory disturbance 04/19/2016  . BMI 31.0-31.9,adult 04/19/2016  . Chronic migraine 02/17/2016  . COPD (chronic obstructive pulmonary disease) ([place])  10/20/2015  . Osteoarthritis of right knee 10/20/2015  . Chronic kidney disease 10/06/2015  . Chronic diastolic congestive heart failure ([place]) 10/06/2015  . Nausea vomiting and diarrhea   . Gastroesophageal reflux disease without esophagitis 03/13/2015  . Neuropathy ([place]) 03/13/2015  . 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 Insomnia 03/25/2013  . Depression 03/25/2013  . Seizures ([place]) 03/25/2013  . 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 Essential hypertension 04/21/2010  . DOE (dyspnea on exertion) 04/21/2010  . CHEST PAIN-UNSPECIFIED 04/21/2010   Madelyn Flavors PT  10/25/2016, [facility] [address] [place], [place], [address] Phone: [phone]   Fax:  [phone]  Name: TISHANNA DUNFORD MRN: [id number] Date of Birth: [birth year]  PHYSICAL THERAPY DISCHARGE SUMMARY  Visits from Start of Care: 9.  Current functional level related to goals / functional outcomes: See above.   Remaining deficits: Continued right elbow pain.   Education / Equipment: HEP. Plan: Patient agrees to  discharge.  Patient goals were partially met. Patient is being discharged due to being pleased with the current functional level.  ?????         Mali Applegate MPT

## 2016-02-09 NOTE — Telephone Encounter (Signed)
Patient had spoken with her neurologist last week and she was prescribed Prednisone at that time which has not helped.  I advised the patient to contact her neurologist since the treatment they prescribed had not been effective and to let us know if she required any more assistance after speaking with them.

## 2016-02-09 NOTE — Telephone Encounter (Signed)
Busy signal at home # needing to call to get more information about message left on providers voicemail about headaches.

## 2016-02-11 ENCOUNTER — Encounter: Payer: Self-pay | Admitting: Physical Therapy

## 2016-02-17 ENCOUNTER — Ambulatory Visit (INDEPENDENT_AMBULATORY_CARE_PROVIDER_SITE_OTHER): Payer: Medicare Other | Admitting: Pediatrics

## 2016-02-17 ENCOUNTER — Encounter: Payer: Self-pay | Admitting: Pediatrics

## 2016-02-17 VITALS — BP 114/71 | HR 78 | Temp 97.3°F | Ht 67.0 in | Wt 197.4 lb

## 2016-02-17 DIAGNOSIS — I10 Essential (primary) hypertension: Secondary | ICD-10-CM | POA: Diagnosis not present

## 2016-02-17 DIAGNOSIS — IMO0002 Reserved for concepts with insufficient information to code with codable children: Secondary | ICD-10-CM | POA: Insufficient documentation

## 2016-02-17 DIAGNOSIS — G43709 Chronic migraine without aura, not intractable, without status migrainosus: Secondary | ICD-10-CM

## 2016-02-17 MED ORDER — KETOROLAC TROMETHAMINE 60 MG/2ML IM SOLN
30.0000 mg | Freq: Once | INTRAMUSCULAR | Status: AC
  Administered 2016-02-17: 30 mg via INTRAMUSCULAR

## 2016-02-17 NOTE — Patient Instructions (Addendum)
Call neurologist for follow up appointment  Don't take tylenol more than twice a week for headache  Try baclofen at home for headache  Let me know if not improving

## 2016-02-17 NOTE — Progress Notes (Signed)
Subjective:    Patient ID: [name], female    DOB: 1951/07/27, 65 y.o.   MRN: [id number]  CC: Follow-up headaches  HPI: [name] is a 65 y.o. female presenting for Follow-up  Having headaches past three weeks. Neurologist prescribed prednisone a few days ago, not sure if it is helping. Took 4 pills first day 3 next day, 2 then 1. Stopped a couple days ago. Hurting on R side of head constantly, to temple to your neck Follows with Dr. Sima Matas in [place] for chronic migraine Taking baclofen when needed, taking 1-2 once a week Has been having weakness RLE since her back surgery No new weakness Has been getting blurry vision with the headache. BP unchanged when she has the headache.  Thinks HA have been worsening since stopped botox injections a couple of months ago  HTN: 120s-130s/70s-80s  Mood has been ok   Depression screen [facility] 2/9 02/17/2016 01/26/2016 12/23/2015 10/20/2015 10/05/2015  Decreased Interest 3 3 2 3  0  Down, Depressed, Hopeless 2 2 3 3  -  PHQ - 2 Score 5 5 5 6  0  Altered sleeping 2 3 3 3  -  Tired, decreased energy 2 3 3 3  -  Change in appetite 0 0 0 2 -  Feeling bad or failure about yourself  0 0 0 0 -  Trouble concentrating 0 0 0 2 -  Moving slowly or fidgety/restless 0 0 0 3 -  Suicidal thoughts 0 0 0 0 -  PHQ-9 Score 9 11 11 19  -  Difficult doing work/chores - - Somewhat difficult Somewhat difficult -     Relevant past medical, surgical, family and social history reviewed and updated as indicated. Interim medical history since our last visit reviewed. Allergies and medications reviewed and updated.    ROS: Per HPI unless specifically indicated above  History  Smoking status  . Former Smoker -- 1.00 packs/day for 30 years  . Types: Cigarettes  . Quit date: 11/13/2012  Smokeless tobacco  . Never Used    Past Medical History Patient Active Problem List   Diagnosis Date Noted  . Chronic migraine 02/17/2016  . COPD  (chronic obstructive pulmonary disease) ([place]) 10/20/2015  . Osteoarthritis of right knee 10/20/2015  . Chronic kidney disease 10/06/2015  . Chronic diastolic congestive heart failure ([place]) 10/06/2015  . Acute psychosis associated with endocrine, metabolic, or cerebrovascular disorder 09/21/2015  . Hallucinations   . Vomiting 04/11/2015  . Metabolic acidosis [id number]  . Diarrhea 04/11/2015  . Nausea vomiting and diarrhea   . Gastroesophageal reflux disease without esophagitis 03/13/2015  . Neuropathy ([place]) 03/13/2015  . 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 Insomnia 03/25/2013  . Depression 03/25/2013  . Seizures ([place]) 03/25/2013  . Hyperlipidemia 03/25/2013  . 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 Essential hypertension 04/21/2010  . DOE (dyspnea on exertion) 04/21/2010  . CHEST PAIN-UNSPECIFIED 04/21/2010       Objective:    BP 114/71 mmHg  Pulse 78  Temp(Src) 97.3 F (36.3 C) (Oral)  Ht 5\' 7"  (1.702 m)  Wt 197 lb 6.4 oz (89.54 kg)  BMI 30.91 kg/m2  Wt Readings from Last 3 Encounters:  02/17/16 197 lb 6.4 oz (89.54 kg)  01/26/16 191 lb (86.637 kg)  12/23/15 192 lb 12.8 oz (87.454 kg)   Gen: NAD, alert, cooperative with exam, NCAT EYES: EOMI, no scleral injection or icterus ENT:  TMs pearly gray b/l, OP without erythema LYMPH: no cervical LAD CV: NRRR, normal  S1/S2, no murmur, distal pulses 2+ b/l Resp: CTABL, no wheezes, normal WOB Abd: +BS, soft, NTND. no guarding or organomegaly Ext: No edema, warm Neuro: Alert and oriented, strength equal b/l UE and LE, CN III-XII intact     Assessment & Plan:    [name] was seen today for follow-up chronic migraine, worse since stopping botox injections, prednisone helped minimally. BP has been well controlled at home. Often starts in her neck. Has baclofen at home to take as needed, not using often. Try baclofen at home, follow up with neurology.  Diagnoses and all orders for this visit:  Chronic  migraine -     ketorolac (TORADOL) injection 30 mg; Inject 1 mL (30 mg total) into the muscle once.  Essential hypertension Well controlled, cont current meds.  Follow up plan: Return in about 2 months (around 04/18/2016) for med follow up.  Assunta Found, MD [facility] 02/17/2016, 1:23 PM

## 2016-02-22 ENCOUNTER — Other Ambulatory Visit: Payer: Self-pay | Admitting: Pediatrics

## 2016-02-29 ENCOUNTER — Ambulatory Visit (INDEPENDENT_AMBULATORY_CARE_PROVIDER_SITE_OTHER): Payer: Medicare Other | Admitting: Pulmonary Disease

## 2016-02-29 ENCOUNTER — Encounter: Payer: Self-pay | Admitting: Pulmonary Disease

## 2016-02-29 VITALS — BP 126/78 | HR 76 | Ht 67.0 in | Wt 193.0 lb

## 2016-02-29 DIAGNOSIS — R0602 Shortness of breath: Secondary | ICD-10-CM

## 2016-02-29 DIAGNOSIS — R0609 Other forms of dyspnea: Secondary | ICD-10-CM

## 2016-02-29 DIAGNOSIS — J986 Disorders of diaphragm: Secondary | ICD-10-CM | POA: Diagnosis not present

## 2016-02-29 DIAGNOSIS — R0600 Dyspnea, unspecified: Secondary | ICD-10-CM

## 2016-02-29 NOTE — Progress Notes (Signed)
Subjective:    Patient ID: [name], female    DOB: [birth year], 65 y.o.   MRN: [id number]  PROBLEM LIST: Chronic right diaphragm paralysis Lung lobectomy Restrictive lung disease Asthma.  HPI  [name] is a 65 year old with past medical history as below. He is a former patient of Dr. Gwenette Richardson who had evaluated her for lung lobectomy, chronic diaphragm paralysis, restrictive lung disease. She has a long history of tobacco use for 30 years, but quit in 2013. 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 As per the patient the right upper lobe was removed. It was thought that her dyspnea was secondary to her restriction from lung resection, diaphragm paralysis.  She was seen earlier this year with symptoms of cough with wheezing. This is associated with sinusitis, rhinitis, postnasal drip. She was started on Advair which has improved his symptoms. She also has history of GERD and is on a PPI.  DATA: Sniff test 07/21/14 Right diaphragm paralysis.  PFTs 08/14/14 FVC 1.36 (48%] FEV1 1.15 (52%) F/F 85 TLC 49% DLCO 47% Severe restriction with reduction in diffusion capacity. Diffusion capacity corrected for alveolar volume.  Chest x-ray 10/23/169 Chronic right diaphragm elevation, chronic atelectatic changes at the right base   Past Medical History  Diagnosis Date  . Seizures ([place])   . Chronic headaches   . Essential hypertension   . Osteoporosis   . Depression   . 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 URI (upper respiratory infection)   . CKD (chronic kidney disease) stage 3, GFR 30-59 ml/min   . Restrictive lung disease   . Hyperlipidemia   . Right leg weakness   . Encephalopathy     Current outpatient prescriptions:  .  acetaminophen (TYLENOL) 500 MG tablet, Take 1,000 mg by mouth every 6 (six) hours as needed for moderate pain., Disp: , Rfl:  .  amLODipine (NORVASC) 10 MG tablet, Take 1 tablet (10 mg total)  by mouth daily., Disp: 30 tablet, Rfl: 5 .  aspirin EC 81 MG tablet, Take 81 mg by mouth daily., Disp: , Rfl:  .  calcitRIOL (ROCALTROL) 0.5 MCG capsule, Take 0.5 mcg by mouth daily. , Disp: , Rfl:  .  calcium acetate (PHOSLO) 667 MG capsule, Take 1 capsule by mouth 3 (three) times daily., Disp: , Rfl:  .  cholecalciferol (VITAMIN D) 1000 UNITS tablet, Take 1,000 Units by mouth daily., Disp: , Rfl:  .  cyclobenzaprine (FLEXERIL) 5 MG tablet, TAKE 1 TABLET BY MOUTH THREE TIMES DAILY AS NEEDED FOR MUSCULE SPASMS, Disp: 30 tablet, Rfl: 2 .  donepezil (ARICEPT) 10 MG tablet, Take 1 tablet by mouth at bedtime FOR MEMORY., Disp: 30 tablet, Rfl: 5 .  DULoxetine (CYMBALTA) 60 MG capsule, Take 60 mg by mouth daily., Disp: , Rfl:  .  ferrous sulfate 325 (65 FE) MG tablet, Take 325 mg by mouth daily with breakfast., Disp: , Rfl:  .  fluticasone (FLONASE) 50 MCG/ACT nasal spray, Place 2 sprays into both nostrils daily., Disp: 16 g, Rfl: 2 .  Fluticasone-Salmeterol (ADVAIR) 100-50 MCG/DOSE AEPB, Inhale 1 puff into the lungs 2 (two) times daily., Disp: 1 each, Rfl: 3 .  furosemide (LASIX) 20 MG tablet, TAKE ONE TABLET BY MOUTH DAILY AS NEEDED, Disp: 30 tablet, Rfl: 4 .  gabapentin (NEURONTIN) 300 MG capsule, Take 1 capsule (300 mg total) by mouth at bedtime., Disp: 30 capsule, Rfl: 1 .  hydrALAZINE (APRESOLINE) 50 MG tablet, TAKE  1 TABLET BY MOUTH THREE TIMES DAILY, Disp: 90 tablet, Rfl: 2 .  hydrOXYzine (ATARAX/VISTARIL) 10 MG tablet, TAKE 1 TABLET BY MOUTH THREE TIMES DAILY AS NEEDED, Disp: 60 tablet, Rfl: 1 .  Ipratropium-Albuterol (COMBIVENT RESPIMAT) 20-100 MCG/ACT AERS respimat, INHALE 2 PUFFS INTO THE LUNGS TWICE A DAY, Disp: 4 g, Rfl: 4 .  levETIRAcetam (KEPPRA) 500 MG tablet, TAKE 1 TABLET BY MOUTH TWICE DAILY, Disp: 60 tablet, Rfl: 2 .  lidocaine (XYLOCAINE) 4 % external solution, Reported on 02/17/2016, Disp: , Rfl:  .  metoprolol (LOPRESSOR) 50 MG tablet, Take 1 tablet (50 mg total) by mouth 2 (two)  times daily., Disp: 60 tablet, Rfl: 5 .  pantoprazole (PROTONIX) 40 MG tablet, Take 1 tablet by mouth daily., Disp: 30 tablet, Rfl: 2 .  pravastatin (PRAVACHOL) 40 MG tablet, Take 1 tablet (40 mg total) by mouth daily., Disp: 90 tablet, Rfl: 3 .  traZODone (DESYREL) 50 MG tablet, Take ONE-HALF (0.5) TO ONE (1) tablet (25-50 mg total) by mouth at bedtime as needed for sleep., Disp: 30 tablet, Rfl: 2  Review of Systems Cough, wheezing, dyspnea, rhinitis, postnasal drip. Denies any sputum production, fevers, chills, hemoptysis. Denies any chest pain, palpitation. Denies any nausea, vomiting, diarrhea, constipation. All other review of systems are negative    Objective:   Physical Exam Blood pressure 126/78, pulse 76, height 5\' 7"  (1.702 m), weight 193 lb (87.544 kg), SpO2 93 %. Gen: No apparent distress Neuro: No gross focal deficits. Neck: No JVD, lymphadenopathy, thyromegaly. RS: Clear, no wheeze, crackles CVS: S1-S2 heard, no murmurs rubs gallops. Abdomen: Soft, positive bowel sounds. Extremities: No edema.    Assessment & Plan:  #1 Restrictive lung disease Likely secondary to her right upper lobectomy, right hemidiaphragm paralysis. It is likely that her hemidiaphragm paralysis occurred due to her surgery. Her PFTs show severe restriction and DLCO reduction. However the DLCO corrects for alveolar volume.  #2 Asthma She has reactive airway disease issue, probably exacerbated by her postnasal drip and GERD. She is already on a flonase, PPI and Advair with improvement in symptoms.   Plan: - Continue Advair, Protonix, flonase  Return in 6 months.  Marshell Garfinkel MD [facility] Pager [phone] If no answer or after 3pm call: [phone] 02/29/2016, 11:35 AM

## 2016-02-29 NOTE — Patient Instructions (Signed)
Please continue your inhalers as prescribed.  Return to clinic in 6 months.

## 2016-03-11 ENCOUNTER — Other Ambulatory Visit: Payer: Self-pay | Admitting: Pediatrics

## 2016-03-29 ENCOUNTER — Other Ambulatory Visit: Payer: Self-pay | Admitting: Nurse Practitioner

## 2016-04-12 DIAGNOSIS — R413 Other amnesia: Secondary | ICD-10-CM | POA: Diagnosis not present

## 2016-04-13 DIAGNOSIS — G3184 Mild cognitive impairment, so stated: Secondary | ICD-10-CM | POA: Diagnosis not present

## 2016-04-14 ENCOUNTER — Telehealth: Payer: Self-pay | Admitting: Nurse Practitioner

## 2016-04-14 ENCOUNTER — Other Ambulatory Visit: Payer: Self-pay | Admitting: *Deleted

## 2016-04-14 DIAGNOSIS — Z9889 Other specified postprocedural states: Secondary | ICD-10-CM

## 2016-04-15 ENCOUNTER — Encounter: Payer: Self-pay | Admitting: Gastroenterology

## 2016-04-19 ENCOUNTER — Encounter: Payer: Self-pay | Admitting: Nurse Practitioner

## 2016-04-19 ENCOUNTER — Ambulatory Visit (INDEPENDENT_AMBULATORY_CARE_PROVIDER_SITE_OTHER): Payer: Medicare Other | Admitting: Nurse Practitioner

## 2016-04-19 VITALS — BP 122/68 | HR 83 | Temp 97.2°F | Ht 67.0 in | Wt 200.0 lb

## 2016-04-19 DIAGNOSIS — R569 Unspecified convulsions: Secondary | ICD-10-CM | POA: Diagnosis not present

## 2016-04-19 DIAGNOSIS — F32A Depression, unspecified: Secondary | ICD-10-CM

## 2016-04-19 DIAGNOSIS — Z6831 Body mass index (BMI) 31.0-31.9, adult: Secondary | ICD-10-CM | POA: Diagnosis not present

## 2016-04-19 DIAGNOSIS — F329 Major depressive disorder, single episode, unspecified: Secondary | ICD-10-CM

## 2016-04-19 DIAGNOSIS — I10 Essential (primary) hypertension: Secondary | ICD-10-CM | POA: Diagnosis not present

## 2016-04-19 DIAGNOSIS — N189 Chronic kidney disease, unspecified: Secondary | ICD-10-CM

## 2016-04-19 DIAGNOSIS — G629 Polyneuropathy, unspecified: Secondary | ICD-10-CM

## 2016-04-19 DIAGNOSIS — E785 Hyperlipidemia, unspecified: Secondary | ICD-10-CM

## 2016-04-19 DIAGNOSIS — J449 Chronic obstructive pulmonary disease, unspecified: Secondary | ICD-10-CM

## 2016-04-19 DIAGNOSIS — G4700 Insomnia, unspecified: Secondary | ICD-10-CM | POA: Diagnosis not present

## 2016-04-19 DIAGNOSIS — E669 Obesity, unspecified: Secondary | ICD-10-CM | POA: Insufficient documentation

## 2016-04-19 DIAGNOSIS — R413 Other amnesia: Secondary | ICD-10-CM | POA: Diagnosis not present

## 2016-04-19 DIAGNOSIS — K219 Gastro-esophageal reflux disease without esophagitis: Secondary | ICD-10-CM | POA: Diagnosis not present

## 2016-04-19 MED ORDER — TRAZODONE HCL 50 MG PO TABS: ORAL_TABLET | ORAL | Status: DC

## 2016-04-19 MED ORDER — DULOXETINE HCL 60 MG PO CPEP: 90.0000 mg | ORAL_CAPSULE | Freq: Every day | ORAL | Status: DC

## 2016-04-19 MED ORDER — GABAPENTIN 600 MG PO TABS: 600.0000 mg | ORAL_TABLET | Freq: Three times a day (TID) | ORAL | Status: DC

## 2016-04-19 MED ORDER — FUROSEMIDE 20 MG PO TABS: 20.0000 mg | ORAL_TABLET | Freq: Every day | ORAL | Status: DC | PRN

## 2016-04-19 MED ORDER — PRAVASTATIN SODIUM 40 MG PO TABS: 40.0000 mg | ORAL_TABLET | Freq: Every day | ORAL | Status: DC

## 2016-04-19 MED ORDER — LEVETIRACETAM 500 MG PO TABS: 500.0000 mg | ORAL_TABLET | Freq: Two times a day (BID) | ORAL | Status: DC

## 2016-04-19 MED ORDER — CITALOPRAM HYDROBROMIDE 20 MG PO TABS: 20.0000 mg | ORAL_TABLET | Freq: Every day | ORAL | Status: DC

## 2016-04-19 MED ORDER — DONEPEZIL HCL 10 MG PO TABS: ORAL_TABLET | ORAL | Status: DC

## 2016-04-19 MED ORDER — PANTOPRAZOLE SODIUM 40 MG PO TBEC: 40.0000 mg | DELAYED_RELEASE_TABLET | Freq: Every day | ORAL | Status: DC

## 2016-04-19 MED ORDER — AMLODIPINE BESYLATE 10 MG PO TABS: 10.0000 mg | ORAL_TABLET | Freq: Every day | ORAL | Status: DC

## 2016-04-19 NOTE — Progress Notes (Signed)
Subjective:    Patient ID: [name], female    DOB: [birth year], 65 y.o.   MRN: [id number]   Patient here today for follow up of chronic medical problems. Patient has been seeing D. Evette Doffing the last several visits- I have not seen her in over a year ans a half. Her last visit she was c/o frequent headaches- no changes were made to her medications but she was given a toradol injection. She was told to follow up with neurologist. [facility] has not been [facility] to get [facility]  Until [facility].  She says that headaches come and go- good days and bad days.   Outpatient Encounter Prescriptions as of 04/19/2016  Medication Sig  . acetaminophen (TYLENOL) 500 MG tablet Take 1,000 mg by mouth every 6 (six) hours as needed for moderate pain.  [name] amLODipine (NORVASC) 10 MG tablet Take 1 tablet by mouth daily FOR BLOOD PRESSURE  . aspirin EC 81 MG tablet Take 81 mg by mouth daily.  . baclofen (LIORESAL) 10 MG tablet Take 10 mg by mouth 3 (three) times daily.  . calcitRIOL (ROCALTROL) 0.5 MCG capsule Take 0.5 mcg by mouth daily.   . calcium acetate (PHOSLO) 667 MG capsule Take 1 capsule by mouth 3 (three) times daily.  . cholecalciferol (VITAMIN D) 1000 UNITS tablet Take 1,000 Units by mouth daily.  . cyclobenzaprine (FLEXERIL) 5 MG tablet TAKE 1 TABLET BY MOUTH THREE TIMES DAILY AS NEEDED FOR MUSCLE SPASMS  . donepezil (ARICEPT) 10 MG tablet Take 1 tablet by mouth at bedtime FOR MEMORY.  . DULoxetine (CYMBALTA) 60 MG capsule Take 90 mg by mouth daily.   . ferrous sulfate 325 (65 FE) MG tablet Take 325 mg by mouth daily with breakfast.  . fluticasone (FLONASE) 50 MCG/ACT nasal spray Place 2 sprays into both nostrils daily.  . Fluticasone-Salmeterol (ADVAIR) 100-50 MCG/DOSE AEPB Inhale 1 puff into the lungs 2 (two) times daily.  . furosemide (LASIX) 20 MG tablet TAKE ONE TABLET BY MOUTH DAILY AS NEEDED  . gabapentin (NEURONTIN) 600 MG tablet Take 600 mg by mouth 3 (three) times daily.  . hydrALAZINE  (APRESOLINE) 50 MG tablet TAKE 1 TABLET BY MOUTH THREE TIMES DAILY  . hydrOXYzine (ATARAX/VISTARIL) 10 MG tablet TAKE 1 TABLET BY MOUTH THREE TIMES DAILY AS NEEDED  . Ipratropium-Albuterol (COMBIVENT RESPIMAT) 20-100 MCG/ACT AERS respimat INHALE 2 PUFFS INTO THE LUNGS TWICE A DAY  . levETIRAcetam (KEPPRA) 500 MG tablet TAKE 1 TABLET BY MOUTH TWICE DAILY  . lidocaine (XYLOCAINE) 4 % external solution Reported on 02/17/2016  . metoprolol (LOPRESSOR) 50 MG tablet Take 1 tablet (50 mg total) by mouth 2 (two) times daily.  . pantoprazole (PROTONIX) 40 MG tablet Take 1 tablet by mouth daily.  . pravastatin (PRAVACHOL) 40 MG tablet Take 1 tablet (40 mg total) by mouth daily.  . traZODone (DESYREL) 50 MG tablet Take ONE-HALF (0.5) TO ONE (1) tablet (25-50 mg total) by mouth at bedtime as needed for sleep.   No facility-administered encounter medications on file as of 04/19/2016.    Marland KitchenHypertension This is a chronic problem. The current episode started more than 1 year ago. The problem has been gradually worsening since onset. The problem is uncontrolled. Associated symptoms include headaches. Pertinent negatives include no palpitations, peripheral edema or sweats. Risk factors for coronary artery disease include dyslipidemia, obesity, post-menopausal state and sedentary lifestyle. Past treatments include beta blockers, central alpha agonists and angiotensin blockers. The current treatment provides mild improvement. Compliance problems include diet  and exercise.  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  Hyperlipidemia This is a chronic problem. The current episode started more than 1 year ago. The problem is uncontrolled. Recent lipid tests were reviewed and are variable. Exacerbating diseases include obesity. She has no history of diabetes or hypothyroidism. Current antihyperlipidemic treatment includes statins. The current treatment provides mild improvement of lipids. Compliance problems include  adherence to diet and adherence to exercise.  Risk factors for coronary artery disease include dyslipidemia, hypertension, obesity and post-menopausal.  depression Patient has been struggling with this for several months- tries to stay busy and that seems to help- only thing se is on is cymbalta but it does not seem to be helping with her depression. insomnia trazadone to sleep- wakes up at night but is able to go back to sleep. seizures On keppra- Last seizure was  In February- sees neurologist every 6 months. GERD protonix working well to keep symptoms under control Neuropathy bil feet On gabapentin which helps a lot with burning and pain- right foot worse then left. C/o lots of pain in legs and feet. COPD advair daily- has needed albuterol 2-3 x a week here lately - been more active- saw Pulmonologist within the last month. Memory disturbance aricept has really helped with memory- she denies any side effects CKD Currently just watching labs  Depression screen [facility] 2/9 02/17/2016 01/26/2016 12/23/2015 10/20/2015 10/05/2015  Decreased Interest '3 3 2 3 ' 0  Down, Depressed, Hopeless '2 2 3 3 ' -  PHQ - 2 Score '5 5 5 6 ' 0  Altered sleeping '2 3 3 3 ' -  Tired, decreased energy '2 3 3 3 ' -  Change in appetite 0 0 0 2 -  Feeling bad or failure about yourself  0 0 0 0 -  Trouble concentrating 0 0 0 2 -  Moving slowly or fidgety/restless 0 0 0 3 -  Suicidal thoughts 0 0 0 0 -  PHQ-9 Score '9 11 11 19 ' -  Difficult doing work/chores - - Somewhat difficult Somewhat difficult -       Review of Systems  Constitutional: Negative.   Eyes: Negative.   Respiratory: Negative.   Cardiovascular: Negative for palpitations.  Endocrine: Negative.   Musculoskeletal: Positive for arthralgias (right knee).  Neurological: Positive for headaches.  Psychiatric/Behavioral: Positive for sleep disturbance and dysphoric mood.  All other systems reviewed and are negative.      Objective:   Physical Exam    Constitutional: She is oriented to person, place, and time. She appears well-developed and well-nourished.  HENT:  Nose: Nose normal.  Mouth/Throat: Oropharynx is clear and moist.  Eyes: EOM are normal.  Neck: Trachea normal, normal range of motion and full passive range of motion without pain. Neck supple. No JVD present. Carotid bruit is not present. No thyromegaly present.  Cardiovascular: Normal rate, regular rhythm, normal heart sounds and intact distal pulses.  Exam reveals no gallop and no friction rub.   No murmur heard. Pulmonary/Chest: Effort normal and breath sounds normal.  Diminished breath sounds bil bases  Abdominal: Soft. Bowel sounds are normal. She exhibits no distension and no mass. There is no tenderness.  Musculoskeletal: Normal range of motion. She exhibits no edema.  Lymphadenopathy:    She has no cervical adenopathy.  Neurological: She is alert and oriented to person, place, and time. She has normal reflexes.  Skin: Skin is warm and dry.  Psychiatric: She has a normal mood and affect. Her behavior is normal. Judgment and  thought content normal.   BP 122/68 mmHg  Pulse 83  Temp(Src) 97.2 F (36.2 C) (Oral)  Ht '5\' 7"'  (1.702 m)  Wt 200 lb (90.719 kg)  BMI 31.32 kg/m2      Assessment & Plan:  1. Hyperlipidemia Low fat diet - pravastatin (PRAVACHOL) 40 MG tablet; Take 1 tablet (40 mg total) by mouth daily.  Dispense: 90 tablet; Refill: 3 - Lipid panel  2. Essential hypertension Do not add salt to diet - furosemide (LASIX) 20 MG tablet; Take 1 tablet (20 mg total) by mouth daily as needed.  Dispense: 30 tablet; Refill: 5 - amLODipine (NORVASC) 10 MG tablet; Take 1 tablet (10 mg total) by mouth daily. for blood pressure  Dispense: 30 tablet; Refill: 5 - CMP14+EGFR  3. Chronic obstructive pulmonary disease, unspecified COPD type ([place])  4. Neuropathy (H[facility]) - gabapentin (NEURONTIN) 600 MG tablet; Take 1 tablet (600 mg total) by mouth 3 (three) times daily.   Dispense: 90 tablet; Refill: 5  5. Insomnia Bedtime ritual - traZODone (DESYREL) 50 MG tablet; Take ONE-HALF (0.5) TO ONE (1) tablet (25-50 mg total) by mouth at bedtime as needed for sleep.  Dispense: 30 tablet; Refill: 5  6. Depression Added celexa to meds - DULoxetine (CYMBALTA) 60 MG capsule; Take 2 capsules (120 mg total) by mouth daily.  Dispense: 30 capsule; Refill: 5 - citalopram (CELEXA) 20 MG tablet; Take 1 tablet (20 mg total) by mouth daily.  Dispense: 30 tablet; Refill: 5  7. Seizures (HCC) - levETIRAcetam (KEPPRA) 500 MG tablet; Take 1 tablet (500 mg total) by mouth 2 (two) times daily.  Dispense: 60 tablet; Refill: 5  8. Chronic kidney disease, unspecified stage  9. Gastroesophageal reflux disease without esophagitis Avoid spicy foods Do not eat 2 hours prior to bedtime - pantoprazole (PROTONIX) 40 MG tablet; Take 1 tablet (40 mg total) by mouth daily.  Dispense: 30 tablet; Refill: 5  10. Memory disturbance - donepezil (ARICEPT) 10 MG tablet; Take 1 tablet by mouth at bedtime FOR MEMORY.  Dispense: 30 tablet; Refill: 5  11. BMI 31.0-31.9,adult Discussed diet and exercise for person with BMI >25 Will recheck weight in 3-6 months    Labs pending Health maintenance reviewed Diet and exercise encouraged Continue all meds Follow up  In 3 months   [place], FNP

## 2016-04-19 NOTE — Patient Instructions (Signed)
Major Depressive Disorder Major depressive disorder is a mental illness. It also may be called clinical depression or unipolar depression. Major depressive disorder usually causes feelings of sadness, hopelessness, or helplessness. Some people with this disorder do not feel particularly sad but lose interest in doing things they used to enjoy (anhedonia). Major depressive disorder also can cause physical symptoms. It can interfere with work, school, relationships, and other normal everyday activities. The disorder varies in severity but is longer lasting and more serious than the sadness we all feel from time to time in our lives. Major depressive disorder often is triggered by stressful life events or major life changes. Examples of these triggers include divorce, loss of your job or home, a move, and the death of a family member or close friend. Sometimes this disorder occurs for no obvious reason at all. People who have family members with major depressive disorder or bipolar disorder are at higher risk for developing this disorder, with or without life stressors. Major depressive disorder can occur at any age. It may occur just once in your life (single episode major depressive disorder). It may occur multiple times (recurrent major depressive disorder). SYMPTOMS People with major depressive disorder have either anhedonia or depressed mood on nearly a daily basis for at least 2 weeks or longer. Symptoms of depressed mood include:  Feelings of sadness (blue or down in the dumps) or emptiness.  Feelings of hopelessness or helplessness.  Tearfulness or episodes of crying (may be observed by others).  Irritability (children and adolescents). In addition to depressed mood or anhedonia or both, people with this disorder have at least four of the following symptoms:  Difficulty sleeping or sleeping too much.   Significant change (increase or decrease) in appetite or weight.   Lack of energy or  motivation.  Feelings of guilt and worthlessness.   Difficulty concentrating, remembering, or making decisions.  Unusually slow movement (psychomotor retardation) or restlessness (as observed by others).   Recurrent wishes for death, recurrent thoughts of self-harm (suicide), or a suicide attempt. People with major depressive disorder commonly have persistent negative thoughts about themselves, other people, and the world. People with severe major depressive disorder may experiencedistorted beliefs or perceptions about the world (psychotic delusions). They also may see or hear things that are not real (psychotic hallucinations). DIAGNOSIS Major depressive disorder is diagnosed through an assessment by your health care provider. Your health care provider will ask aboutaspects of your daily life, such as mood,sleep, and appetite, to see if you have the diagnostic symptoms of major depressive disorder. Your health care provider may ask about your medical history and use of alcohol or drugs, including prescription medicines. Your health care provider also may do a physical exam and blood work. This is because certain medical conditions and the use of certain substances can cause major depressive disorder-like symptoms (secondary depression). Your health care provider also may refer you to a mental health specialist for further evaluation and treatment. TREATMENT It is important to recognize the symptoms of major depressive disorder and seek treatment. The following treatments can be prescribed for this disorder:   Medicine. Antidepressant medicines usually are prescribed. Antidepressant medicines are thought to correct chemical imbalances in the brain that are commonly associated with major depressive disorder. Other types of medicine may be added if the symptoms do not respond to antidepressant medicines alone or if psychotic delusions or hallucinations occur.  Talk therapy. Talk therapy can be  helpful in treating major depressive disorder by providing   support, education, and guidance. Certain types of talk therapy also can help with negative thinking (cognitive behavioral therapy) and with relationship issues that trigger this disorder (interpersonal therapy). A mental health specialist can help determine which treatment is best for you. Most people with major depressive disorder do well with a combination of medicine and talk therapy. Treatments involving electrical stimulation of the brain can be used in situations with extremely severe symptoms or when medicine and talk therapy do not work over time. These treatments include electroconvulsive therapy, transcranial magnetic stimulation, and vagal nerve stimulation.   This information is not intended to replace advice given to you by your health care provider. Make sure you discuss any questions you have with your health care provider.   Document Released: 03/11/2013 Document Revised: 12/05/2014 Document Reviewed: 03/11/2013 Elsevier Interactive Patient Education 2016 Elsevier Inc.  

## 2016-04-20 LAB — LIPID PANEL
Chol/HDL Ratio: 1.9 ratio units (ref 0.0–4.4)
Cholesterol, Total: 149 mg/dL (ref 100–199)
HDL: 78 mg/dL (ref >39)
LDL Calculated: 53 mg/dL (ref 0–99)
Triglycerides: 91 mg/dL (ref 0–149)
VLDL Cholesterol Cal: 18 mg/dL (ref 5–40)

## 2016-04-20 LAB — CMP14+EGFR
ALT: 17 IU/L (ref 0–32)
AST: 18 IU/L (ref 0–40)
Albumin/Globulin Ratio: 1.4 (ref 1.2–2.2)
Albumin: 4.1 g/dL (ref 3.6–4.8)
Alkaline Phosphatase: 159 IU/L — ABNORMAL HIGH (ref 39–117)
BUN/Creatinine Ratio: 21 (ref 12–28)
BUN: 29 mg/dL — ABNORMAL HIGH (ref 8–27)
Bilirubin Total: <0.2 mg/dL (ref 0.0–1.2)
CO2: 21 mmol/L (ref 18–29)
Calcium: 9.4 mg/dL (ref 8.7–10.3)
Chloride: 105 mmol/L (ref 96–106)
Creatinine, Ser: 1.41 mg/dL — ABNORMAL HIGH (ref 0.57–1.00)
GFR calc Af Amer: 45 mL/min/{1.73_m2} — ABNORMAL LOW (ref >59)
GFR calc non Af Amer: 39 mL/min/{1.73_m2} — ABNORMAL LOW (ref >59)
Globulin, Total: 2.9 g/dL (ref 1.5–4.5)
Glucose: 88 mg/dL (ref 65–99)
Potassium: 5.3 mmol/L — ABNORMAL HIGH (ref 3.5–5.2)
Sodium: 141 mmol/L (ref 134–144)
Total Protein: 7 g/dL (ref 6.0–8.5)

## 2016-05-02 ENCOUNTER — Other Ambulatory Visit: Payer: Self-pay | Admitting: Nurse Practitioner

## 2016-05-16 DIAGNOSIS — G3184 Mild cognitive impairment, so stated: Secondary | ICD-10-CM | POA: Diagnosis not present

## 2016-05-24 ENCOUNTER — Telehealth: Payer: Self-pay | Admitting: *Deleted

## 2016-05-24 NOTE — Telephone Encounter (Signed)
[name],  Per Dr Loletha Carrow' request can you review this chart and make sure she is eligible for her colon in the [place]. Thanks, Lelan Pons PV

## 2016-05-24 NOTE — Telephone Encounter (Signed)
Thank you for the note.  I do not think she needs an office visit.  Please send her case to Osvaldo Angst for review to see if he agrees that she is appropriate for the [place].  Thanks.  HD

## 2016-05-24 NOTE — Telephone Encounter (Signed)
Dr Loletha Carrow, I have been reviewing this cahrt for PV. Pt has a hx of seizures, she is on Keppra BID and her last seizure was February 2017.  She sees neuro every 6 months, last OV was 12-29-2015, last EEG was 11-30-2015 which was normal.  She ahs a medical history of GERD, HTN, seizures, chronic kidney disease, chronic CHF and her last scho 11-30-2015 showed EF 60-65% with no AVS, migraines. She is a direct colon with you, last colon > 40 years ago Are we okay to proceed with her colon or does she need an OV? Please advise, thanks for your time,  Marijean Niemann

## 2016-05-25 NOTE — Telephone Encounter (Signed)
[name],  This pt is cleared for anesthetic care at [facility].  Thanks,  Jenny Reichmann

## 2016-06-09 ENCOUNTER — Ambulatory Visit (HOSPITAL_COMMUNITY): Payer: Self-pay | Admitting: Hematology & Oncology

## 2016-06-09 ENCOUNTER — Other Ambulatory Visit (HOSPITAL_COMMUNITY): Payer: Self-pay

## 2016-06-13 ENCOUNTER — Ambulatory Visit (INDEPENDENT_AMBULATORY_CARE_PROVIDER_SITE_OTHER): Payer: Medicare Other | Admitting: Pediatrics

## 2016-06-13 ENCOUNTER — Encounter: Payer: Self-pay | Admitting: Pediatrics

## 2016-06-13 VITALS — BP 107/60 | HR 76 | Temp 97.1°F | Ht 67.0 in | Wt 194.0 lb

## 2016-06-13 DIAGNOSIS — R55 Syncope and collapse: Secondary | ICD-10-CM

## 2016-06-13 DIAGNOSIS — I10 Essential (primary) hypertension: Secondary | ICD-10-CM | POA: Diagnosis not present

## 2016-06-13 DIAGNOSIS — E875 Hyperkalemia: Secondary | ICD-10-CM | POA: Diagnosis not present

## 2016-06-13 DIAGNOSIS — J449 Chronic obstructive pulmonary disease, unspecified: Secondary | ICD-10-CM | POA: Diagnosis not present

## 2016-06-13 DIAGNOSIS — R569 Unspecified convulsions: Secondary | ICD-10-CM

## 2016-06-13 DIAGNOSIS — IMO0002 Reserved for concepts with insufficient information to code with codable children: Secondary | ICD-10-CM

## 2016-06-13 DIAGNOSIS — G43709 Chronic migraine without aura, not intractable, without status migrainosus: Secondary | ICD-10-CM | POA: Diagnosis not present

## 2016-06-13 NOTE — Progress Notes (Signed)
  Subjective:    Patient ID: [name], female    DOB: 04/19/1951, 65 y.o.   MRN: [id number]  CC: Generalized Body Aches and Loss of Consciousness   HPI: [name] is a 65 y.o. female presenting for Generalized Body Aches and Loss of Consciousness  Had an episode last week was coming out of the bathroom and son heard her fall Pt thinks she was out fo 2-3 min Head was hurting when she woke up Head has been hurting more than usual since then, has headaches in the back of her head and on R side Headache getting better today compared to immediately after event Some swelling back of her head and over R ear On ASA daily for TIAs, no other blood thinners Now feeling weak in her legs SAys she is under a lot of pressure taking care of her aunt  No recent other episodes of syncope Last happened 6 months ago when coughing a lot BPs have been up and down Has also been seen for AMS and unsteadiness on her feet in the past year  She says she does have racing heart rate 1-2 times a week Doesn't remember any chest pressure, change in HR before falling last week  Says it was unlike her seizures, after those she is very confused and this time she wasn't confused but was very tired and had to go to bed   Depression screen PHQ 2/9 06/13/2016 04/19/2016 02/17/2016 01/26/2016 12/23/2015  Decreased Interest 0 1 3 3 2  Down, Depressed, Hopeless 0 1 2 2 3  PHQ - 2 Score 0 2 5 5 5  Altered sleeping - 2 2 3 3  Tired, decreased energy - 3 2 3 3  Change in appetite - 0 0 0 0  Feeling bad or failure about yourself  - 0 0 0 0  Trouble concentrating - 0 0 0 0  Moving slowly or fidgety/restless - 0 0 0 0  Suicidal thoughts - 0 0 0 0  PHQ-9 Score - 7 9 11 11  Difficult doing work/chores - Somewhat difficult - - Somewhat difficult     Relevant past medical, surgical, family and social history reviewed and updated as indicated.  Interim medical history since our last visit  reviewed. Allergies and medications reviewed and updated.  ROS: Per HPI unless specifically indicated above  History  Smoking status  . Former Smoker -- 1.00 packs/day for 30 years  . Types: Cigarettes  . Quit date: 11/13/2012  Smokeless tobacco  . Never Used       Objective:    BP 107/60 mmHg  Pulse 76  Temp(Src) 97.1 F (36.2 C) (Oral)  Ht 5' 7" (1.702 m)  Wt 194 lb (87.998 kg)  BMI 30.38 kg/m2  Wt Readings from Last 3 Encounters:  06/13/16 194 lb (87.998 kg)  04/19/16 200 lb (90.719 kg)  02/29/16 193 lb (87.544 kg)     Gen: NAD, alert, moving around well EYES: EOMI, no scleral injection or icterus ENT:  TMs pearly gray b/l, OP without erythema LYMPH: no cervical LAD CV: NRRR, normal S1/S2, no murmur Resp: CTABL, no wheezes, normal WOB Abd: +BS, soft, NTND. no guarding or organomegaly Ext: No edema, warm Neuro: Alert and oriented, strength equal b/l UE and LE, coordination normal, CN III-XII intact Psych: well groomed, normal affect     Assessment & Plan:    Flossie was seen today for generalized body aches and loss of consciousness.  Diagnoses and   all orders for this visit:  Syncope and collapse No lightheadedness or warning signs prior to event No post-ictal period afterwards, no loss of bladder/bowel LOC lasted for a couple minutes per pt's son who heard her fall, had time to get damp towel for her head Has been having heart palpitations a couple times a week, feels like her heart is racing Will refer to cardiology for eval possible arrhythmia -     Ambulatory referral to Cardiology -     BMP8+EGFR  Chronic obstructive pulmonary disease, unspecified COPD type (HCC) Symptoms well controlled Continue inhalers  Chronic migraine Has headache slightly worse than baseline headaches at back of head and R side where she fell Improved since fall On aspirin, no blood thinners  Essential hypertension Bp slighlty low today Much higher at home at  times Denies lightheadedness  Seizures (HCC) Cont keppra  Hyperkalemia -     BMP8+EGFR     Follow up plan: No Follow-up on file.   , MD [facility] 06/13/2016, 11:25 AM   

## 2016-06-14 LAB — BMP8+EGFR
BUN/Creatinine Ratio: 20 (ref 12–28)
BUN: 34 mg/dL — ABNORMAL HIGH (ref 8–27)
CO2: 21 mmol/L (ref 18–29)
Calcium: 9.5 mg/dL (ref 8.7–10.3)
Chloride: 103 mmol/L (ref 96–106)
Creatinine, Ser: 1.67 mg/dL — ABNORMAL HIGH (ref 0.57–1.00)
GFR calc Af Amer: 37 mL/min/{1.73_m2} — ABNORMAL LOW (ref >59)
GFR calc non Af Amer: 32 mL/min/{1.73_m2} — ABNORMAL LOW (ref >59)
Glucose: 66 mg/dL (ref 65–99)
Potassium: 5.7 mmol/L — ABNORMAL HIGH (ref 3.5–5.2)
Sodium: 141 mmol/L (ref 134–144)

## 2016-06-15 ENCOUNTER — Encounter: Payer: Self-pay | Admitting: Gastroenterology

## 2016-06-15 ENCOUNTER — Telehealth: Payer: Self-pay | Admitting: *Deleted

## 2016-06-15 ENCOUNTER — Other Ambulatory Visit: Payer: Self-pay | Admitting: Pediatrics

## 2016-06-15 DIAGNOSIS — E875 Hyperkalemia: Secondary | ICD-10-CM

## 2016-06-15 NOTE — Telephone Encounter (Signed)
Pt is wanting labs from 06/13/16 - i can see them, but you haven't noted them yet = she wants a call today

## 2016-06-15 NOTE — Telephone Encounter (Signed)
I called pt back with lab results. Potassium slightly elevated, pt with CKD, Cr up slightly Not on ACE-i or ARB Not on potassium Take extra dose of lasix tonight, keep drinking plenty of fluids, come back to clinic for lab draw in two days.

## 2016-06-24 ENCOUNTER — Other Ambulatory Visit: Payer: Self-pay | Admitting: Nurse Practitioner

## 2016-06-27 ENCOUNTER — Encounter: Payer: Self-pay | Admitting: Pediatrics

## 2016-06-27 ENCOUNTER — Ambulatory Visit (INDEPENDENT_AMBULATORY_CARE_PROVIDER_SITE_OTHER): Payer: Medicare Other | Admitting: Pediatrics

## 2016-06-27 VITALS — BP 113/65 | HR 70 | Temp 95.9°F | Ht 67.0 in | Wt 193.6 lb

## 2016-06-27 DIAGNOSIS — I10 Essential (primary) hypertension: Secondary | ICD-10-CM | POA: Diagnosis not present

## 2016-06-27 DIAGNOSIS — E875 Hyperkalemia: Secondary | ICD-10-CM | POA: Diagnosis not present

## 2016-06-27 DIAGNOSIS — G43709 Chronic migraine without aura, not intractable, without status migrainosus: Secondary | ICD-10-CM | POA: Diagnosis not present

## 2016-06-27 DIAGNOSIS — IMO0002 Reserved for concepts with insufficient information to code with codable children: Secondary | ICD-10-CM

## 2016-06-27 DIAGNOSIS — N189 Chronic kidney disease, unspecified: Secondary | ICD-10-CM | POA: Diagnosis not present

## 2016-06-27 MED ORDER — TOPIRAMATE 15 MG PO CPSP: 15.0000 mg | ORAL_CAPSULE | Freq: Two times a day (BID) | ORAL | 1 refills | Status: DC

## 2016-06-27 MED ORDER — HYDRALAZINE HCL 50 MG PO TABS: 50.0000 mg | ORAL_TABLET | Freq: Three times a day (TID) | ORAL | 2 refills | Status: DC

## 2016-06-27 NOTE — Progress Notes (Signed)
    Subjective:    Patient ID: [name], female    DOB: Feb 02, 1951, 65 y.o.   MRN: [id number]  CC: Follow-up (Headaches)   HPI: [name] is a 65 y.o. female presenting for Follow-up (Headaches)  No more falls Feels tired and drained No vision changes Still with R sided headaches, comes and goes Feeling well otherwise  Avoiding NSAIDs with CKD Has appt to f/u with neprhology in a few weeks  Colonoscopy scheduled 3 weeks  No dizzyness, no lightheadedness No numbness, weakness   Depression screen [facility] 2/9 06/13/2016 04/19/2016 02/17/2016 01/26/2016 12/23/2015  Decreased Interest 0 '1 3 3 2  '$ Down, Depressed, Hopeless 0 '1 2 2 3  '$ PHQ - 2 Score 0 '2 5 5 5  '$ Altered sleeping - '2 2 3 3  '$ Tired, decreased energy - '3 2 3 3  '$ Change in appetite - 0 0 0 0  Feeling bad or failure about yourself  - 0 0 0 0  Trouble concentrating - 0 0 0 0  Moving slowly or fidgety/restless - 0 0 0 0  Suicidal thoughts - 0 0 0 0  PHQ-9 Score - '7 9 11 11  '$ Difficult doing work/chores - Somewhat difficult - - Somewhat difficult  Some recent data might be hidden     Relevant past medical, surgical, family and social history reviewed and updated. Interim medical history since our last visit reviewed. Allergies and medications reviewed and updated.  History  Smoking Status  . Former Smoker  . Packs/day: 1.00  . Years: 30.00  . Types: Cigarettes  . Quit date: 11/13/2012  Smokeless Tobacco  . Never Used    ROS: Per HPI      Objective:    BP 113/65   Pulse 70   Temp (!) 95.9 F (35.5 C) (Oral)   Ht '5\' 7"'$  (1.702 m)   Wt 193 lb 9.6 oz (87.8 kg)   BMI 30.32 kg/m   Wt Readings from Last 3 Encounters:  06/27/16 193 lb 9.6 oz (87.8 kg)  06/13/16 194 lb (88 kg)  04/19/16 200 lb (90.7 kg)     Gen: NAD, alert, cooperative with exam, NCAT EYES: EOMI, no scleral injection or icterus ENT:  TMs pearly gray b/l, OP without erythema LYMPH: no cervical LAD CV: NRRR, normal S1/S2, no  murmur, distal pulses 2+ b/l Resp: CTABL, no wheezes, normal WOB Ext: No edema, warm Neuro: Alert and oriented, strength equal b/l UE and LE, CN III-XII intact     Assessment & Plan:    [name] was seen today for follow-up mulitple med problems.  Diagnoses and all orders for this visit:  Chronic migraine -     topiramate (TOPAMAX) 15 MG capsule; Take 1 capsule (15 mg total) by mouth 2 (two) times daily.  Essential hypertension Continue meds, refill below -     hydrALAZINE (APRESOLINE) 50 MG tablet; Take 1 tablet (50 mg total) by mouth 3 (three) times daily. -     BMP8+EGFR  Chronic kidney disease, unspecified stage Avoid NSAIDs -     BMP8+EGFR  Hyperkalemia Not on ACE-I/ARB Repeat lab -     BMP8+EGFR    Colonoscopy scheduled for 3 weeks  Follow up plan: Return in about 8 weeks (around 08/22/2016).  Assunta Found, MD [facility] 06/27/2016, 10:52 AM

## 2016-07-04 DIAGNOSIS — N189 Chronic kidney disease, unspecified: Secondary | ICD-10-CM | POA: Diagnosis not present

## 2016-07-04 DIAGNOSIS — Z885 Allergy status to narcotic agent status: Secondary | ICD-10-CM | POA: Diagnosis not present

## 2016-07-04 DIAGNOSIS — R938 Abnormal findings on diagnostic imaging of other specified body structures: Secondary | ICD-10-CM | POA: Diagnosis not present

## 2016-07-04 DIAGNOSIS — Z886 Allergy status to analgesic agent status: Secondary | ICD-10-CM | POA: Diagnosis not present

## 2016-07-04 DIAGNOSIS — Z888 Allergy status to other drugs, medicaments and biological substances status: Secondary | ICD-10-CM | POA: Diagnosis not present

## 2016-07-04 DIAGNOSIS — I129 Hypertensive chronic kidney disease with stage 1 through stage 4 chronic kidney disease, or unspecified chronic kidney disease: Secondary | ICD-10-CM | POA: Diagnosis not present

## 2016-07-04 DIAGNOSIS — R079 Chest pain, unspecified: Secondary | ICD-10-CM | POA: Diagnosis not present

## 2016-07-04 DIAGNOSIS — Z880 Allergy status to penicillin: Secondary | ICD-10-CM | POA: Diagnosis not present

## 2016-07-04 DIAGNOSIS — Z87891 Personal history of nicotine dependence: Secondary | ICD-10-CM | POA: Diagnosis not present

## 2016-07-04 DIAGNOSIS — R51 Headache: Secondary | ICD-10-CM | POA: Diagnosis not present

## 2016-07-04 DIAGNOSIS — R55 Syncope and collapse: Secondary | ICD-10-CM | POA: Diagnosis not present

## 2016-07-04 DIAGNOSIS — Z79899 Other long term (current) drug therapy: Secondary | ICD-10-CM | POA: Diagnosis not present

## 2016-07-04 DIAGNOSIS — G43119 Migraine with aura, intractable, without status migrainosus: Secondary | ICD-10-CM | POA: Diagnosis not present

## 2016-07-04 DIAGNOSIS — Z882 Allergy status to sulfonamides status: Secondary | ICD-10-CM | POA: Diagnosis not present

## 2016-07-04 DIAGNOSIS — R569 Unspecified convulsions: Secondary | ICD-10-CM | POA: Diagnosis not present

## 2016-07-04 DIAGNOSIS — G43109 Migraine with aura, not intractable, without status migrainosus: Secondary | ICD-10-CM | POA: Diagnosis not present

## 2016-07-11 ENCOUNTER — Ambulatory Visit: Payer: Self-pay | Admitting: Cardiovascular Disease

## 2016-07-15 ENCOUNTER — Other Ambulatory Visit: Payer: Self-pay | Admitting: Pediatrics

## 2016-07-15 ENCOUNTER — Other Ambulatory Visit (HOSPITAL_COMMUNITY): Payer: Self-pay

## 2016-07-15 ENCOUNTER — Encounter: Payer: Self-pay | Admitting: Pediatrics

## 2016-07-15 ENCOUNTER — Ambulatory Visit (INDEPENDENT_AMBULATORY_CARE_PROVIDER_SITE_OTHER): Payer: Medicare Other | Admitting: Pediatrics

## 2016-07-15 ENCOUNTER — Ambulatory Visit (HOSPITAL_COMMUNITY): Payer: Self-pay

## 2016-07-15 VITALS — BP 106/56 | HR 63 | Temp 97.5°F | Ht 67.0 in | Wt 194.8 lb

## 2016-07-15 DIAGNOSIS — F32A Depression, unspecified: Secondary | ICD-10-CM

## 2016-07-15 DIAGNOSIS — N189 Chronic kidney disease, unspecified: Secondary | ICD-10-CM

## 2016-07-15 DIAGNOSIS — I10 Essential (primary) hypertension: Secondary | ICD-10-CM | POA: Diagnosis not present

## 2016-07-15 DIAGNOSIS — R569 Unspecified convulsions: Secondary | ICD-10-CM

## 2016-07-15 DIAGNOSIS — F329 Major depressive disorder, single episode, unspecified: Secondary | ICD-10-CM | POA: Diagnosis not present

## 2016-07-15 NOTE — Progress Notes (Signed)
  Subjective:   Patient ID: [name], female    DOB: 09/18/1951, 65 y.o.   MRN: [id number] CC: Hospitalization Follow-up (Fainting)  HPI: [name] is a 65 y.o. female presenting for Hospitalization Follow-up (Fainting)  IN ED after having possible seizure Headaches have been worse since falling and hitting her head during a possible seizure 11 days ago She had a post-ictal period Took at least 30 min to start remembering, EMS brought her to ED, doesn't remember ambulance ride Had another episode of blacking out a few days after the emergency room visit, she was standing walking, then just staring and not responding to family. Son says she had her eyes closed when this happens, he had to help her down and then she would sleep for a couple of hours Normal self when she wakes up Stopped taking topamax  Relevant past medical, surgical, family and social history reviewed. Allergies and medications reviewed and updated. History  Smoking Status  . Former Smoker  . Packs/day: 1.00  . Years: 30.00  . Types: Cigarettes  . Quit date: 11/13/2012  Smokeless Tobacco  . Never Used   ROS: Per HPI   Objective:    BP (!) 106/56 (BP Location: Left Arm, Patient Position: Sitting, Cuff Size: Large)   Pulse 63   Temp 97.5 F (36.4 C) (Oral)   Ht '5\' 7"'$  (1.702 m)   Wt 194 lb 12.8 oz (88.4 kg)   BMI 30.51 kg/m   Wt Readings from Last 3 Encounters:  07/15/16 194 lb 12.8 oz (88.4 kg)  06/27/16 193 lb 9.6 oz (87.8 kg)  06/13/16 194 lb (88 kg)    Gen: NAD, alert, cooperative with exam, NCAT EYES: EOMI, no conjunctival injection, or no icterus ENT:  OP without erythema LYMPH: no cervical LAD CV: NRRR, normal S1/S2, no murmur, distal pulses 2+ b/l Resp: CTABL, no wheezes, normal WOB Abd: +BS, soft, NTND. no guarding or organomegaly Ext: No edema, warm Neuro: Alert and oriented  Assessment & Plan:  Charron was seen today for hospitalization follow-up.  Diagnoses and all  orders for this visit:  Seizures ([place]) H/o seizure disorder Post-ictal period with recent seizure On keppra -     Ambulatory referral to Neurology  Depression Followed by pscyhiatry in [place], see care everywhere On ativan, aricept, cymbalta  Chronic kidney disease, unspecified stage Was tol din the ED Cr was 4.5, per available records appears it was 2, will repeat today -     BMP8+EGFR  Essential hypertension BP well controlled if not slightly low If lightheaded, may need to decrease meds -     BMP8+EGFR   Follow up plan: 4 weeks Assunta Found, MD [facility]

## 2016-07-17 ENCOUNTER — Telehealth: Payer: Self-pay | Admitting: Family Medicine

## 2016-07-17 DIAGNOSIS — E875 Hyperkalemia: Secondary | ICD-10-CM

## 2016-07-17 LAB — BMP8+EGFR
BUN/Creatinine Ratio: 21 (ref 12–28)
BUN: 34 mg/dL — ABNORMAL HIGH (ref 8–27)
CO2: 10 mmol/L — ABNORMAL LOW (ref 18–29)
Calcium: 9.9 mg/dL (ref 8.7–10.3)
Chloride: 111 mmol/L — ABNORMAL HIGH (ref 96–106)
Creatinine, Ser: 1.61 mg/dL — ABNORMAL HIGH (ref 0.57–1.00)
GFR calc Af Amer: 39 mL/min/{1.73_m2} — ABNORMAL LOW (ref >59)
GFR calc non Af Amer: 34 mL/min/{1.73_m2} — ABNORMAL LOW (ref >59)
Glucose: 76 mg/dL (ref 65–99)
Potassium: 6.9 mmol/L (ref 3.5–5.2)
Sodium: 145 mmol/L — ABNORMAL HIGH (ref 134–144)

## 2016-07-18 NOTE — Telephone Encounter (Signed)
Spoke with pt, she has not yet had lab rechecked, will come today to have drawn in clinic.

## 2016-07-18 NOTE — Telephone Encounter (Signed)
Patient did not go to hospital to have creatinine rechecked, she is coming her tomorrow to have this done.  Her Fioricet prescription was called in to [facility] in [place].

## 2016-07-18 NOTE — Telephone Encounter (Signed)
See vincent 8/17 - may print - can be phoned in if approved

## 2016-07-20 ENCOUNTER — Encounter: Payer: Self-pay | Admitting: Gastroenterology

## 2016-07-22 NOTE — Telephone Encounter (Signed)
Pt needs to come to clinic today for redraw of potassium

## 2016-07-22 NOTE — Telephone Encounter (Signed)
Pt aware and will come today or tomorrow at the latest.

## 2016-07-26 ENCOUNTER — Other Ambulatory Visit: Payer: Self-pay | Admitting: Pediatrics

## 2016-07-26 DIAGNOSIS — G43709 Chronic migraine without aura, not intractable, without status migrainosus: Secondary | ICD-10-CM

## 2016-07-26 DIAGNOSIS — IMO0002 Reserved for concepts with insufficient information to code with codable children: Secondary | ICD-10-CM

## 2016-07-27 ENCOUNTER — Other Ambulatory Visit (HOSPITAL_COMMUNITY): Payer: Self-pay | Admitting: *Deleted

## 2016-07-27 DIAGNOSIS — D472 Monoclonal gammopathy: Secondary | ICD-10-CM

## 2016-07-27 NOTE — Progress Notes (Signed)
Cardiology Office Note   Date:  07/27/2016   ID:  [name], DOB Jan 01, 1951, MRN [id number]  PCP:  Chevis Pretty, FNP  Cardiologist:   Jenkins Rouge, MD   No chief complaint on file.     History of Present Illness: [name] is a 65 y.o. female who presents for evaluation of syncope These seem to be related To neurologic issues. History of chronic headaches and seizures. On Keppra has been evaluated for  "fainting' and found to have prolonged post ictal period with no cardiac arrhythmias noted.   CRF: s HTN and elevated lipids   She has chronic dyspnea and has seen Dr Gwenette Greet and Mannam.  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 Chronic headaches   . CKD (chronic kidney disease) stage 3, GFR 30-59 ml/min   . Depression   . Encephalopathy   . Essential hypertension   . Hyperlipidemia   . 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 Osteoporosis   . Restrictive lung disease   . Right leg weakness   . Seizures ([place])   . URI (upper respiratory infection)     Past Surgical History:  Procedure Laterality Date  . ABDOMINAL HYSTERECTOMY    . APPENDECTOMY    . BACK SURGERY  2002  . BREAST REDUCTION SURGERY    . CHOLECYSTECTOMY    . LUNG LOBECTOMY Right    Cyst     Current Outpatient Prescriptions  Medication Sig Dispense Refill  . acetaminophen (TYLENOL) 500 MG tablet Take 1,000 mg by mouth every 6 (six) hours as needed for moderate pain.    [name] amLODipine (NORVASC) 10 MG tablet Take 1 tablet (10 mg total) by mouth daily. for blood pressure 30 tablet 5  . aspirin EC 81 MG tablet Take 81 mg by mouth daily.    . baclofen (LIORESAL) 10 MG tablet Take 10 mg by mouth 3 (three) times daily.    .  butalbital-acetaminophen-caffeine (FIORICET, ESGIC) 50-325-40 MG tablet TAKE 1 TO 2 TABLETS BY MOUTH EVERY 6 HOURS AS NEEDED FOR HEADACHES 20 tablet 0  . calcitRIOL (ROCALTROL) 0.5 MCG capsule Take 0.5 mcg by mouth daily.     . calcium acetate (PHOSLO) 667 MG capsule Take 1 capsule by mouth 3 (three) times daily.    . cholecalciferol (VITAMIN D) 1000 UNITS tablet Take 1,000 Units by mouth daily.    . citalopram (CELEXA) 20 MG tablet Take 1 tablet (20 mg total) by mouth daily. 30 tablet 5  . cyclobenzaprine (FLEXERIL) 5 MG tablet TAKE 1 TABLET BY MOUTH THREE TIMES DAILY AS NEEDED FOR MUSCLE SPASMS 30 tablet 0  . donepezil (ARICEPT) 10 MG tablet Take 1 tablet by mouth at bedtime FOR MEMORY. 30 tablet 5  . DULoxetine (CYMBALTA) 60 MG capsule Take 2 capsules (120 mg total) by mouth daily. 30 capsule 5  . ferrous sulfate 325 (65 FE) MG tablet Take 325 mg by mouth daily with breakfast.    . fluticasone (FLONASE) 50 MCG/ACT nasal spray Place 2 sprays into both nostrils daily. 16 g 2  . Fluticasone-Salmeterol (ADVAIR) 100-50 MCG/DOSE AEPB Inhale 1 puff into the lungs 2 (two) times daily. 1 each 3  . furosemide (LASIX) 20 MG tablet Take 1 tablet (20 mg total) by mouth daily as  needed. 30 tablet 5  . gabapentin (NEURONTIN) 600 MG tablet Take 1 tablet (600 mg total) by mouth 3 (three) times daily. 90 tablet 5  . hydrALAZINE (APRESOLINE) 50 MG tablet Take 1 tablet (50 mg total) by mouth 3 (three) times daily. 90 tablet 2  . hydrOXYzine (ATARAX/VISTARIL) 10 MG tablet TAKE 1 TABLET BY MOUTH THREE TIMES DAILY AS NEEDED 60 tablet 1  . Ipratropium-Albuterol (COMBIVENT RESPIMAT) 20-100 MCG/ACT AERS respimat INHALE 2 PUFFS INTO THE LUNGS TWICE A DAY 4 g 4  . levETIRAcetam (KEPPRA) 500 MG tablet Take 1 tablet (500 mg total) by mouth 2 (two) times daily. 60 tablet 5  . pantoprazole (PROTONIX) 40 MG tablet Take 1 tablet (40 mg total) by mouth daily. 30 tablet 5  . pravastatin (PRAVACHOL) 40 MG tablet Take 1 tablet (40  mg total) by mouth daily. 90 tablet 3  . traZODone (DESYREL) 50 MG tablet Take ONE-HALF (0.5) TO ONE (1) tablet (25-50 mg total) by mouth at bedtime as needed for sleep. 30 tablet 5   No current facility-administered medications for this visit.     Allergies:   Rofecoxib; Asa [aspirin]; Penicillins; Sulfonamide derivatives; and Tylenol with codeine #3 [acetaminophen-codeine]    Social History:  The patient  reports that she quit smoking about 3 years ago. Her smoking use included Cigarettes. She has a 30.00 pack-year smoking history. She has never used smokeless tobacco. She reports that she does not drink alcohol or use drugs.   Family History:  The patient's family history includes Arthritis in her mother; Diabetes in her mother; Heart disease in her mother; Hypertension in her father and mother; Kidney disease in her mother.    ROS:  Please see the history of present illness.   Otherwise, review of systems are positive for none.   All other systems are reviewed and negative.    PHYSICAL EXAM: VS:  There were no vitals taken for this visit. , BMI There is no height or weight on file to calculate BMI. Affect appropriate Healthy:  appears stated age 49: normal Neck supple with no adenopathy JVP normal no bruits no thyromegaly Lungs clear with no wheezing and good diaphragmatic motion Heart:  S1/S2 no murmur, no rub, gallop or click PMI normal Abdomen: benighn, BS positve, no tenderness, no AAA no bruit.  No HSM or HJR Distal pulses intact with no bruits No edema Neuro non-focal Skin warm and dry No muscular weakness    EKG:  09/20/15  ST rate 113 otherwise normal    Recent Labs: 09/13/2015: TSH 0.393 10/05/2015: BNP 34.4 12/11/2015: Hemoglobin 12.7; Platelets 427 04/19/2016: ALT 17 07/15/2016: BUN 34; Creatinine, Ser 1.61; Potassium 6.9; Sodium 145    Lipid Panel    Component Value Date/Time   CHOL 149 04/19/2016 1616   TRIG 91 04/19/2016 1616   TRIG 132 03/13/2015  1045   HDL 78 04/19/2016 1616   HDL 58 03/13/2015 1045   CHOLHDL 1.9 04/19/2016 1616   LDLCALC 53 04/19/2016 1616      Wt Readings from Last 3 Encounters:  07/15/16 194 lb 12.8 oz (88.4 kg)  06/27/16 193 lb 9.6 oz (87.8 kg)  06/13/16 194 lb (88 kg)      Other studies Reviewed: Additional studies/ records that were reviewed today include: Epic notes ER visit [facility] notes labs and ECG;s  .    ASSESSMENT AND PLAN:  1.  Syncope not likely related to heart will check 21 day event monitor for her palpitations and ?  Tachycardia as Well as echo to r/o structural heart dx  2. HTN: Well controlled.  Continue current medications and low sodium Dash type diet.    3. Seizures continue Keppra not driving f/u neuro likely etiology of "syncope" 4. Psych admits to frequent panic attacks likely related to tachycardia f/u behavioral health   Current medicines are reviewed at length with the patient today.  The patient does not have concerns regarding medicines.  The following changes have been made:  no change  Labs/ tests ordered today include: 21 day Event monitor Echo  No orders of the defined types were placed in this encounter.    Disposition:   FU with NP after event monitor      Signed, Jenkins Rouge, MD  07/27/2016 12:01 PM    [facility] [address], [place], [place]  [address] Phone: [phone]; Fax: [phone]

## 2016-07-28 ENCOUNTER — Encounter: Payer: Self-pay | Admitting: Cardiovascular Disease

## 2016-07-28 ENCOUNTER — Ambulatory Visit (INDEPENDENT_AMBULATORY_CARE_PROVIDER_SITE_OTHER): Payer: Medicare Other

## 2016-07-28 ENCOUNTER — Ambulatory Visit (INDEPENDENT_AMBULATORY_CARE_PROVIDER_SITE_OTHER): Payer: Medicare Other | Admitting: Cardiovascular Disease

## 2016-07-28 VITALS — BP 134/68 | HR 86 | Ht 67.0 in | Wt 193.0 lb

## 2016-07-28 DIAGNOSIS — R55 Syncope and collapse: Secondary | ICD-10-CM

## 2016-07-28 DIAGNOSIS — R002 Palpitations: Secondary | ICD-10-CM

## 2016-07-28 DIAGNOSIS — F488 Other specified nonpsychotic mental disorders: Secondary | ICD-10-CM

## 2016-07-28 NOTE — Patient Instructions (Signed)
Your physician recommends that you schedule a follow-up appointment with Vanessa Sims, NP.   Your physician recommends that you continue on your current medications as directed. Please refer to the Current Medication list given to you today.  Your physician has recommended that you wear an event monitor. Event monitors are medical devices that record the heart's electrical activity. Doctors most often [place] these monitors to diagnose arrhythmias. Arrhythmias are problems with the speed or rhythm of the heartbeat. The monitor is a small, portable device. You can wear one while you do your normal daily activities. This is usually used to diagnose what is causing palpitations/syncope (passing out).  Your physician has requested that you have an echocardiogram. Echocardiography is a painless test that uses sound waves to create images of your heart. It provides your doctor with information about the size and shape of your heart and how well your heart's chambers and valves are working. This procedure takes approximately one hour. There are no restrictions for this procedure.  If you need a refill on your cardiac medications before your next appointment, please call your pharmacy.  Thank you for choosing [facility]!

## 2016-07-29 ENCOUNTER — Encounter (HOSPITAL_COMMUNITY): Payer: Medicare Other | Attending: Hematology & Oncology | Admitting: Hematology & Oncology

## 2016-07-29 ENCOUNTER — Encounter (HOSPITAL_COMMUNITY): Payer: Medicare Other

## 2016-07-29 ENCOUNTER — Encounter (HOSPITAL_COMMUNITY): Payer: Self-pay | Admitting: Hematology & Oncology

## 2016-07-29 VITALS — BP 144/99 | HR 66 | Temp 98.0°F | Resp 16 | Wt 193.6 lb

## 2016-07-29 DIAGNOSIS — D72822 Plasmacytosis: Secondary | ICD-10-CM | POA: Diagnosis not present

## 2016-07-29 DIAGNOSIS — M47896 Other spondylosis, lumbar region: Secondary | ICD-10-CM | POA: Diagnosis not present

## 2016-07-29 DIAGNOSIS — J984 Other disorders of lung: Secondary | ICD-10-CM | POA: Diagnosis not present

## 2016-07-29 DIAGNOSIS — D472 Monoclonal gammopathy: Secondary | ICD-10-CM | POA: Diagnosis present

## 2016-07-29 DIAGNOSIS — I5032 Chronic diastolic (congestive) heart failure: Secondary | ICD-10-CM | POA: Diagnosis not present

## 2016-07-29 DIAGNOSIS — Z87891 Personal history of nicotine dependence: Secondary | ICD-10-CM | POA: Diagnosis not present

## 2016-07-29 DIAGNOSIS — F329 Major depressive disorder, single episode, unspecified: Secondary | ICD-10-CM | POA: Diagnosis not present

## 2016-07-29 DIAGNOSIS — N179 Acute kidney failure, unspecified: Secondary | ICD-10-CM | POA: Insufficient documentation

## 2016-07-29 DIAGNOSIS — R569 Unspecified convulsions: Secondary | ICD-10-CM | POA: Diagnosis not present

## 2016-07-29 DIAGNOSIS — E785 Hyperlipidemia, unspecified: Secondary | ICD-10-CM | POA: Diagnosis not present

## 2016-07-29 DIAGNOSIS — I130 Hypertensive heart and chronic kidney disease with heart failure and stage 1 through stage 4 chronic kidney disease, or unspecified chronic kidney disease: Secondary | ICD-10-CM | POA: Insufficient documentation

## 2016-07-29 DIAGNOSIS — N183 Chronic kidney disease, stage 3 (moderate): Secondary | ICD-10-CM | POA: Diagnosis not present

## 2016-07-29 DIAGNOSIS — M5030 Other cervical disc degeneration, unspecified cervical region: Secondary | ICD-10-CM | POA: Diagnosis not present

## 2016-07-29 LAB — CBC WITH DIFFERENTIAL/PLATELET
Basophils Absolute: 0.1 10*3/uL (ref 0.0–0.1)
Basophils Relative: 2 %
Eosinophils Absolute: 0.2 10*3/uL (ref 0.0–0.7)
Eosinophils Relative: 5 %
HCT: 39.1 % (ref 36.0–46.0)
Hemoglobin: 12.4 g/dL (ref 12.0–15.0)
Lymphocytes Relative: 27 %
Lymphs Abs: 1.2 10*3/uL (ref 0.7–4.0)
MCH: 30.8 pg (ref 26.0–34.0)
MCHC: 31.7 g/dL (ref 30.0–36.0)
MCV: 97 fL (ref 78.0–100.0)
Monocytes Absolute: 0.3 10*3/uL (ref 0.1–1.0)
Monocytes Relative: 7 %
Neutro Abs: 2.5 10*3/uL (ref 1.7–7.7)
Neutrophils Relative %: 59 %
Platelets: 373 10*3/uL (ref 150–400)
RBC: 4.03 MIL/uL (ref 3.87–5.11)
RDW: 12.7 % (ref 11.5–15.5)
WBC: 4.3 10*3/uL (ref 4.0–10.5)

## 2016-07-29 LAB — COMPREHENSIVE METABOLIC PANEL
ALT: 25 U/L (ref 14–54)
AST: 22 U/L (ref 15–41)
Albumin: 4 g/dL (ref 3.5–5.0)
Alkaline Phosphatase: 145 U/L — ABNORMAL HIGH (ref 38–126)
Anion gap: 10 (ref 5–15)
BUN: 22 mg/dL — ABNORMAL HIGH (ref 6–20)
CO2: 22 mmol/L (ref 22–32)
Calcium: 9 mg/dL (ref 8.9–10.3)
Chloride: 106 mmol/L (ref 101–111)
Creatinine, Ser: 1.35 mg/dL — ABNORMAL HIGH (ref 0.44–1.00)
GFR calc Af Amer: 47 mL/min — ABNORMAL LOW (ref >60)
GFR calc non Af Amer: 41 mL/min — ABNORMAL LOW (ref >60)
Glucose, Bld: 74 mg/dL (ref 65–99)
Potassium: 4.4 mmol/L (ref 3.5–5.1)
Sodium: 138 mmol/L (ref 135–145)
Total Bilirubin: 0.5 mg/dL (ref 0.3–1.2)
Total Protein: 7.3 g/dL (ref 6.5–8.1)

## 2016-07-29 NOTE — Progress Notes (Signed)
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 - SEE COMMENT. PERIPHERAL BLOOD: - UNREMARKABLE. Diagnosis Note The bone marrow is overall normocellular. There is a mild plasmacytosis (approximately 5%) which is polytypic by light chain immunohistochemistry. These findings are non-specific and are not diagnostic for a plasma cell neoplasm. Vicente Males MD Pathologist, Electronic Signature (Case signed 03/03/2014) GROSS AND MICROSCOPIC INFORMATIO  CKD  CURRENT THERAPY: Observation  INTERVAL HISTORY: [name] 65 y.o. female returns for follow up of MGUS. She has been diagnosed with encephalopathy and acute on chronic renal failure.   [name] is unaccompanied. I personally reviewed and went over laboratory studies with the patient.  She reports recent seizures. She believes these are stress-related. Her anti-seizure medication has not been changed recently.  She reports depression. She follows with her PCP for this.  She reports pain in her lower extremity joints.  She also reports head pain. She is scheduled to meet with Dr. Leta Baptist of [facility] on 08/09/2016.  She had a heart monitor placed yesterday and will wear it for 21 days. This was placed "because her potassium was high."   She is scheduled to meet with a kidney specialist at [facility] on 08/11/2016. She has no other complaints or concerns today.   MEDICAL HISTORY: Past Medical History:  Diagnosis Date  . Chronic headaches   . CKD (chronic kidney disease) stage 3, GFR 30-59 ml/min   . Depression   . Encephalopathy   . Essential hypertension   . Hyperlipidemia   . MGUS (monoclonal gammopathy of unknown  significance) 03/25/2013  . Osteoporosis   . Restrictive lung disease   . Right leg weakness   . Seizures ([place])   . URI (upper respiratory infection)     has Essential hypertension; DOE (dyspnea on exertion); CHEST PAIN-UNSPECIFIED; Insomnia; Depression; Seizures ([place]); MGUS (monoclonal gammopathy of unknown significance); Diaphragm paralysis; Gastroesophageal reflux disease without esophagitis; Neuropathy ([place]); Nausea vomiting and diarrhea; Chronic kidney disease; Chronic diastolic congestive heart failure (HCC); COPD (chronic obstructive pulmonary disease) ([place]); Osteoarthritis of right knee; Chronic migraine; Memory disturbance; and BMI 31.0-31.9,adult on her problem list.     is allergic to rofecoxib; asa [aspirin]; penicillins; sulfonamide derivatives; and tylenol with codeine #3 [acetaminophen-codeine].  [name] had no medications administered during this visit.   SURGICAL HISTORY: Past Surgical History:  Procedure Laterality Date  . ABDOMINAL HYSTERECTOMY    . APPENDECTOMY    . BACK SURGERY  2002  . BREAST REDUCTION SURGERY    . CHOLECYSTECTOMY    . LUNG LOBECTOMY Right    Cyst    SOCIAL HISTORY: Social History   Social History  . Marital status: Widowed    Spouse name: N/A  . Number of children: N/A  . Years of education: N/A   Occupational History  . Unemployed    Social History Main Topics  . Smoking status: Former Smoker    Packs/day: 1.00    Years: 30.00    Types: Cigarettes    Quit date: 11/13/2012  . Smokeless tobacco: Never Used  . Alcohol use No  . Drug use: No  . Sexual activity: Not on file   Other Topics Concern  . Not on file   Social History Narrative  . No narrative on file  FAMILY HISTORY: Family History  Problem Relation Age of Onset  . Hypertension Mother   . Heart disease Mother   . Diabetes Mother   . Kidney disease Mother   . Arthritis Mother   . Hypertension Father     Review of Systems  Constitutional:  Negative for fever, chills and weight loss.  HENT: Negative for hearing loss.   Eyes: Negative for blurred vision and double vision.  Respiratory: Negative for cough.   Cardiovascular: Negative for chest pain and palpitations.  Gastrointestinal: Negative for nausea, vomiting, abdominal pain, diarrhea, constipation and blood in stool.  Genitourinary: Negative for dysuria, urgency, frequency and hematuria.  Musculoskeletal: Positive for joint pain. Negative for myalgias and back pain.       Joint pain in lower extremities Skin: Negative for rash.  Neurological: Positive for headaches and seizures. Negative for dizziness, tingling.  Psychiatric/Behavioral: Positive for depression.  14 point review of systems was performed and is negative except as detailed under history of present illness and above   PHYSICAL EXAMINATION  ECOG PERFORMANCE STATUS: 1 - Symptomatic but completely ambulatory  Vitals - 1 value per visit 03/31/6269  SYSTOLIC 350  DIASTOLIC 99  Pulse 66  Temperature 98  Respirations 16  Weight (lb) 193.6  Height   BMI 30.32  VISIT REPORT     Physical Exam  Constitutional: She is oriented to person, place, and time and well-developed, well-nourished, and in no distress.  HENT:  Head: Normocephalic and atraumatic.  Nose: Nose normal.  Mouth/Throat: Oropharynx is clear and moist. No oropharyngeal exudate.  Eyes: Conjunctivae and EOM are normal. Pupils are equal, round, and reactive to light. Right eye exhibits no discharge. Left eye exhibits no discharge. No scleral icterus.  Neck: Normal range of motion. Neck supple. No tracheal deviation present. No thyromegaly present.  Cardiovascular: Normal rate, regular rhythm and normal heart sounds.  Exam reveals no gallop and no friction rub.   No murmur heard. Occasional ectopy. Heart monitor noted.  Pulmonary/Chest: Effort normal and breath sounds normal. She has no wheezes. She has no rales.  Abdominal: Soft. Bowel sounds are  normal. She exhibits no distension and no mass. There is no tenderness. There is no rebound and no guarding.  Musculoskeletal: Normal range of motion. She exhibits no edema.  Lymphadenopathy:    She has no cervical adenopathy.  Neurological: She is alert and oriented to person, place, and time. She has normal reflexes. No cranial nerve deficit. Gait normal. Coordination normal.  Skin: Skin is warm and dry. No rash noted.  Psychiatric: Mood, memory, affect and judgment normal.  Nursing note and vitals reviewed.   LABORATORY DATA: I have reviewed the data as listed Results for [name], [name] (MRN [id number])   Ref. Range 07/29/2016 11:03  WBC Latest Ref Range: 4.0 - 10.5 K/uL 4.3  RBC Latest Ref Range: 3.87 - 5.11 MIL/uL 4.03  Hemoglobin Latest Ref Range: 12.0 - 15.0 g/dL 12.4  HCT Latest Ref Range: 36.0 - 46.0 % 39.1  MCV Latest Ref Range: 78.0 - 100.0 fL 97.0  MCH Latest Ref Range: 26.0 - 34.0 pg 30.8  MCHC Latest Ref Range: 30.0 - 36.0 g/dL 31.7  RDW Latest Ref Range: 11.5 - 15.5 % 12.7  Platelets Latest Ref Range: 150 - 400 K/uL 373  Neutrophils Latest Units: % 59  Lymphocytes Latest Units: % 27  Monocytes Relative Latest Units: % 7  Eosinophil Latest Units: % 5  Basophil Latest Units: % 2  NEUT# Latest Ref  Range: 1.7 - 7.7 K/uL 2.5  Lymphocyte # Latest Ref Range: 0.7 - 4.0 K/uL 1.2  Monocyte # Latest Ref Range: 0.1 - 1.0 K/uL 0.3  Eosinophils Absolute Latest Ref Range: 0.0 - 0.7 K/uL 0.2  Basophils Absolute Latest Ref Range: 0.0 - 0.1 K/uL 0.1   Results for [name], [name] (MRN [id number])   Ref. Range 07/29/2016 11:03  Sodium Latest Ref Range: 135 - 145 mmol/L 138  Potassium Latest Ref Range: 3.5 - 5.1 mmol/L 4.4  Chloride Latest Ref Range: 101 - 111 mmol/L 106  CO2 Latest Ref Range: 22 - 32 mmol/L 22  BUN Latest Ref Range: 6 - 20 mg/dL 22 (H)  Creatinine Latest Ref Range: 0.44 - 1.00 mg/dL 1.35 (H)  Calcium Latest Ref Range: 8.9 - 10.3 mg/dL 9.0  EGFR (Non-African  Amer.) Latest Ref Range: >60 mL/min 41 (L)  EGFR (African American) Latest Ref Range: >60 mL/min 47 (L)  Glucose Latest Ref Range: 65 - 99 mg/dL 74  Anion gap Latest Ref Range: 5 - 15  10  Alkaline Phosphatase Latest Ref Range: 38 - 126 U/L 145 (H)  Albumin Latest Ref Range: 3.5 - 5.0 g/dL 4.0  AST Latest Ref Range: 15 - 41 U/L 22  ALT Latest Ref Range: 14 - 54 U/L 25  Total Protein Latest Ref Range: 6.5 - 8.1 g/dL 7.3  Total Bilirubin Latest Ref Range: 0.3 - 1.2 mg/dL 0.5           PATHOLOGY: Diagnosis Bone Marrow, Aspirate,Biopsy, and Clot, right iliac - NORMOCELLULAR MARROW WITH MILD POLYTYPIC PLASMACYTOSIS (5%). - SEE COMMENT. PERIPHERAL BLOOD: - UNREMARKABLE. Diagnosis Note The bone marrow is overall normocellular. There is a mild plasmacytosis (approximately 5%) which is polytypic by light chain immunohistochemistry. These findings are non-specific and are not diagnostic for a plasma cell neoplasm. Vicente Males MD   RADIOLOGY: I have reviewed the above documentation for accuracy and completeness, and I agree with the above. CLINICAL DATA:  Monoclonal gammopathy of unknown significance. Right-sided pain.  EXAM: METASTATIC BONE SURVEY  COMPARISON:  05/21/2015  FINDINGS: Elevated right hemidiaphragm with atelectasis of the right lung base.  Cervical spondylosis and degenerative disc disease. Lumbar spondylosis and degenerative disc disease. Numerous calcified granulomas in the spleen.  No lytic bony lesions characteristic of myeloma identified.  IMPRESSION: 1. No lytic bony lesions characteristic myeloma are identified. 2. Chronic right hemidiaphragmatic elevation with atelectasis at the right lung base. 3. Cervical and lumbar spondylosis and degenerative disc disease. 4. 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   Electronically Signed   By: Van Clines M.D.   On: 12/11/2015 12:42   ASSESSMENT and THERAPY PLAN:  IgG kappa  MGUS CKD BMBX 02/2014 HTN BMBX 2015 with mild 5% polytypic plasmacytosis  Myeloma labs are stable. Last bone survey was in January.  Bone marrow biopsy was performed in 2015, M spike is small, normal CBC, normal myeloma survey; I feel ongoing observation is reasonable.   We will continue with observation for now. We will keep the patient on her 6 month office visits. Should anything change in the interim she can return to the clinic for additional follow-up prior to her next established visit.  She is scheduled to meet with Dr. Leta [facility] of [facility] on 08/09/2016.  She had a heart monitor placed yesterday and will wear it for 21 days. This was placed because her potassium was high. She is scheduled for an echocardiogram on 08/10/2016.  She is scheduled to meet with a  nephrologist at [facility] on 08/11/2016. I advised her that if another BMBX is discussed to let me know as we can arrange for this locally if she desires.   Orders Placed This Encounter  Procedures  . CBC with Differential    Standing Status:   Future    Standing Expiration Date:   07/29/2017  . Comprehensive metabolic panel    Standing Status:   Future    Standing Expiration Date:   07/29/2017  . Kappa/lambda light chains    Standing Status:   Future    Standing Expiration Date:   07/29/2017  . IgG, IgA, IgM    Standing Status:   Future    Standing Expiration Date:   07/29/2017  . Protein electrophoresis, serum    Standing Status:   Future    Standing Expiration Date:   07/29/2017  . Immunofixation electrophoresis    Standing Status:   Future    Standing Expiration Date:   07/29/2017    All questions were answered. The patient knows to call the clinic with any problems, questions or concerns. We can certainly see the patient much sooner if necessary.   This note was electronically signed.  This document serves as a record of services personally performed by Ancil Linsey, MD. It was created on her behalf by Arlyce Harman, a trained medical scribe. The creation of this record is based on the scribe's personal observations and the provider's statements to them. This document has been checked and approved by the attending provider.  I have reviewed the above documentation for accuracy and completeness, and I agree with the above.  Molli Hazard, MD

## 2016-07-29 NOTE — Patient Instructions (Signed)
[place] at [facility] Discharge Instructions  RECOMMENDATIONS MADE BY THE CONSULTANT AND ANY TEST RESULTS WILL BE SENT TO YOUR REFERRING PHYSICIAN.  You saw Dr. Whitney Muse today. Follow up in 6 months with labs.  Thank you for choosing [place] at [facility] to provide your oncology and hematology care.  To afford each patient quality time with our provider, please arrive at least 15 minutes before your scheduled appointment time.   Beginning January 23rd 2017 lab work for the [facility] will be done in the  Main lab at [facility] on 1st floor. If you have a lab appointment with the [facility] please come in thru the  Main Entrance and check in at the main information desk  You need to re-schedule your appointment should you arrive 10 or more minutes late.  We strive to give you quality time with our providers, and arriving late affects you and other patients whose appointments are after yours.  Also, if you no show three or more times for appointments you may be dismissed from the clinic at the providers discretion.     Again, thank you for choosing [facility].  Our hope is that these requests will decrease the amount of time that you wait before being seen by our physicians.       _____________________________________________________________  Should you have questions after your visit to [facility], please contact our office at [phone] between the hours of 8:30 a.m. and 4:30 p.m.  Voicemails left after 4:30 p.m. will not be returned until the following business day.  For prescription refill requests, have your pharmacy contact our office.         Resources For Cancer Patients and their Caregivers ? American Cancer Society: Can assist with transportation, wigs, general needs, runs Look Good Feel Better.        (416) 164-2631 ? Cancer Care: Provides financial assistance, online support groups,  medication/co-pay assistance.  1-800-813-HOPE 574-186-8561) ? [facility] Assists [place] cancer patients and their families through emotional , educational and financial support.  367-387-9322 ? [place] DSS Where to apply for food stamps, Medicaid and utility assistance. 437-143-9063 ? [facility]: Transportation to medical appointments. 419-688-3223 ? Social Security Administration: May apply for disability if have a Stage IV cancer. 534-141-1101 772-264-1043 ? [employer], Disability and Transit Services: Assists with nutrition, care and transit needs. [facility] Support Programs: @10RELATIVEDAYS @ > Cancer Support Group  2nd Tuesday of the month 1pm-2pm, Journey Room  > Creative Journey  3rd Tuesday of the month 1130am-1pm, Journey Room  > Look Good Feel Better  1st Wednesday of the month 10am-12 noon, Journey Room (Call [place] to register [phone])

## 2016-07-30 LAB — IGG, IGA, IGM
IgA: 223 mg/dL (ref 87–352)
IgG (Immunoglobin G), Serum: 1258 mg/dL (ref 700–1600)
IgM, Serum: 176 mg/dL (ref 26–217)

## 2016-08-02 LAB — KAPPA/LAMBDA LIGHT CHAINS
Kappa free light chain: 52.2 mg/L — ABNORMAL HIGH (ref 3.3–19.4)
Kappa, lambda light chain ratio: 1.67 — ABNORMAL HIGH (ref 0.26–1.65)
Lambda free light chains: 31.2 mg/L — ABNORMAL HIGH (ref 5.7–26.3)

## 2016-08-02 LAB — PROTEIN ELECTROPHORESIS, SERUM
A/G Ratio: 1.4 (ref 0.7–1.7)
Albumin ELP: 4.2 g/dL (ref 2.9–4.4)
Alpha-1-Globulin: 0.2 g/dL (ref 0.0–0.4)
Alpha-2-Globulin: 0.6 g/dL (ref 0.4–1.0)
Beta Globulin: 1 g/dL (ref 0.7–1.3)
Gamma Globulin: 1.3 g/dL (ref 0.4–1.8)
Globulin, Total: 3.1 g/dL (ref 2.2–3.9)
M-Spike, %: 0.3 g/dL — ABNORMAL HIGH
Total Protein ELP: 7.3 g/dL (ref 6.0–8.5)

## 2016-08-02 LAB — IMMUNOFIXATION ELECTROPHORESIS
IgA: 219 mg/dL (ref 87–352)
IgG (Immunoglobin G), Serum: 1197 mg/dL (ref 700–1600)
IgM, Serum: 171 mg/dL (ref 26–217)
Total Protein ELP: 7.1 g/dL (ref 6.0–8.5)

## 2016-08-07 ENCOUNTER — Encounter (HOSPITAL_COMMUNITY): Payer: Self-pay | Admitting: Hematology & Oncology

## 2016-08-08 ENCOUNTER — Telehealth: Payer: Self-pay | Admitting: *Deleted

## 2016-08-08 NOTE — Telephone Encounter (Signed)
LVM on mobile number requesting call back re: impending weather. Advised in VM that this RN was checking to make sure patient wanted to come in for early appointment tomorrow due to impending weather. Requested she call back to advise she will come in or to reschedule if she prefers. Attempted to reach on home phone x 2, no answer, no voice mail.  Left name, number.

## 2016-08-09 ENCOUNTER — Ambulatory Visit (INDEPENDENT_AMBULATORY_CARE_PROVIDER_SITE_OTHER): Payer: Medicare Other | Admitting: Diagnostic Neuroimaging

## 2016-08-09 ENCOUNTER — Encounter: Payer: Self-pay | Admitting: Diagnostic Neuroimaging

## 2016-08-09 VITALS — BP 132/72 | HR 65 | Ht 67.0 in | Wt 198.6 lb

## 2016-08-09 DIAGNOSIS — G43709 Chronic migraine without aura, not intractable, without status migrainosus: Secondary | ICD-10-CM | POA: Diagnosis not present

## 2016-08-09 DIAGNOSIS — R569 Unspecified convulsions: Secondary | ICD-10-CM

## 2016-08-09 DIAGNOSIS — IMO0002 Reserved for concepts with insufficient information to code with codable children: Secondary | ICD-10-CM

## 2016-08-09 DIAGNOSIS — G40909 Epilepsy, unspecified, not intractable, without status epilepticus: Secondary | ICD-10-CM | POA: Diagnosis not present

## 2016-08-09 NOTE — Progress Notes (Signed)
[facility]  PATIENT: [name] DOB: 07-02-1951  REFERRING CLINICIAN: Evette Doffing HISTORY FROM: patient  REASON FOR VISIT: new consult    HISTORICAL  CHIEF COMPLAINT:  Chief Complaint  Patient presents with  . Seizures    rm 7 New Pt, "seizures since age 65, last sz 07/08/16; HA x 1 month"    HISTORY OF PRESENT ILLNESS:   65 year old female here for evaluation of headaches and seizures. Patient has hypertension, migraine, depression, anxiety, chronic renal insufficiency.  Patient has had "seizures" since age 43 years old consisting of passing out, shaking, convulsions. Initially she was on phenobarbital, Depakote, Dilantin, with one seizure per month. Seizures eventually tapered off to one seizure per year. 07/04/2016 patient had 3 seizures in one week. Patient was staring, unresponsive without shaking spell, in the setting of increased headaches. Patient to the emergency room for evaluation and was referred for follow-up in neurology clinic. Of note patient had established with epilepsy specialist Dr. Ernest Haber [facility] neurology clinic, had video EEG EMU monitoring admission in 2014, with 2 typical episodes and no epileptiform discharges. Therefore patient was diagnosed with non-up with topics seizures at that time and tapered off of levetiracetam. However in 2016 patient went to the hospital for encephalopathy and partial seizures, was restarted on levetiracetam. This was continued by her PCP.  Patient has had history of headaches since age 19 years old. She describes severe global headaches with nausea, right-sided predominance, photophobia and phonophobia. Patient was diagnosed with chronic migraine. Patient averaging up to 28 days of headache per month. She was previously treated with atenolol, Topamax, gabapentin, Cymbalta, Botox injections without relief. She was treated by headache specialist Dr. Joretta Bachelor. Review of headache clinic notes mentions that  Botox was helping improve headache frequency but patient had to stop taking medication due to financial issues as well as patient preference. He    REVIEW OF SYSTEMS: Full 14 system review of systems performed and negative with exception of:  depression anxiety not asleep decreased energy headache weakness seizure passing out insomnia restless legs increased thirst blurred vision palpitations cough.  ALLERGIES: Allergies  Allergen Reactions  . Rofecoxib Rash    Reaction to VIOXX  . Asa [Aspirin]     High dose Aspirin causes itching but can take 81mg    . Penicillins Hives    Has patient had a PCN reaction causing immediate rash, facial/tongue/throat swelling, SOB or lightheadedness with hypotension: Yes Has patient had a PCN reaction causing severe rash involving mucus membranes or skin necrosis: Yes-rash/hives Has patient had a PCN reaction that required hospitalization No Has patient had a PCN reaction occurring within the last 10 years: Yes If all of the above answers are "NO", then may proceed with Cephalosporin use.   . Sulfonamide Derivatives Hives  . Tylenol With Codeine #3 [Acetaminophen-Codeine] Itching    HOME MEDICATIONS: Outpatient Medications Prior to Visit  Medication Sig Dispense Refill  . acetaminophen (TYLENOL) 500 MG tablet Take 1,000 mg by mouth every 6 (six) hours as needed for moderate pain.    [name] amLODipine (NORVASC) 10 MG tablet Take 1 tablet (10 mg total) by mouth daily. for blood pressure 30 tablet 5  . aspirin EC 81 MG tablet Take 81 mg by mouth daily.    . baclofen (LIORESAL) 10 MG tablet Take 10 mg by mouth 3 (three) times daily.    . butalbital-acetaminophen-caffeine (FIORICET, ESGIC) 50-325-40 MG tablet TAKE 1 TO 2 TABLETS BY MOUTH EVERY 6 HOURS AS NEEDED FOR HEADACHES  20 tablet 0  . calcitRIOL (ROCALTROL) 0.5 MCG capsule Take 0.5 mcg by mouth daily.     . calcium acetate (PHOSLO) 667 MG capsule Take 1 capsule by mouth 3 (three) times daily.    .  cholecalciferol (VITAMIN D) 1000 UNITS tablet Take 1,000 Units by mouth daily.    . citalopram (CELEXA) 20 MG tablet Take 1 tablet (20 mg total) by mouth daily. 30 tablet 5  . cyclobenzaprine (FLEXERIL) 5 MG tablet TAKE 1 TABLET BY MOUTH THREE TIMES DAILY AS NEEDED FOR MUSCLE SPASMS 30 tablet 0  . donepezil (ARICEPT) 10 MG tablet Take 1 tablet by mouth at bedtime FOR MEMORY. 30 tablet 5  . DULoxetine (CYMBALTA) 60 MG capsule Take 2 capsules (120 mg total) by mouth daily. 30 capsule 5  . ferrous sulfate 325 (65 FE) MG tablet Take 325 mg by mouth daily with breakfast.    . fluticasone (FLONASE) 50 MCG/ACT nasal spray Place 2 sprays into both nostrils daily. 16 g 2  . Fluticasone-Salmeterol (ADVAIR) 100-50 MCG/DOSE AEPB Inhale 1 puff into the lungs 2 (two) times daily. 1 each 3  . furosemide (LASIX) 20 MG tablet Take 1 tablet (20 mg total) by mouth daily as needed. 30 tablet 5  . gabapentin (NEURONTIN) 600 MG tablet Take 1 tablet (600 mg total) by mouth 3 (three) times daily. 90 tablet 5  . hydrALAZINE (APRESOLINE) 50 MG tablet Take 1 tablet (50 mg total) by mouth 3 (three) times daily. 90 tablet 2  . hydrOXYzine (ATARAX/VISTARIL) 10 MG tablet TAKE 1 TABLET BY MOUTH THREE TIMES DAILY AS NEEDED 60 tablet 1  . Ipratropium-Albuterol (COMBIVENT RESPIMAT) 20-100 MCG/ACT AERS respimat INHALE 2 PUFFS INTO THE LUNGS TWICE A DAY 4 g 4  . levETIRAcetam (KEPPRA) 500 MG tablet Take 1 tablet (500 mg total) by mouth 2 (two) times daily. 60 tablet 5  . pantoprazole (PROTONIX) 40 MG tablet Take 1 tablet (40 mg total) by mouth daily. 30 tablet 5  . pravastatin (PRAVACHOL) 40 MG tablet Take 1 tablet (40 mg total) by mouth daily. 90 tablet 3  . topiramate (TOPAMAX) 15 MG capsule Take 1 capsule (15 mg total) by mouth 2 times daily. 60 capsule 0  . traZODone (DESYREL) 50 MG tablet Take ONE-HALF (0.5) TO ONE (1) tablet (25-50 mg total) by mouth at bedtime as needed for sleep. 30 tablet 5   No facility-administered  medications prior to visit.     PAST MEDICAL HISTORY: Past Medical History:  Diagnosis Date  . Chronic headaches   . CKD (chronic kidney disease) stage 3, GFR 30-59 ml/min   . Depression   . Encephalopathy   . Essential hypertension   . Hyperlipidemia   . 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 Osteoporosis   . Restrictive lung disease   . Right leg weakness   . Seizures [facility])    first sz age 76, last seizure Aug 2017  . URI (upper respiratory infection)     PAST SURGICAL HISTORY: Past Surgical History:  Procedure Laterality Date  . ABDOMINAL HYSTERECTOMY    . APPENDECTOMY    . BACK SURGERY  2002  . BREAST REDUCTION SURGERY    . CHOLECYSTECTOMY    . LUNG LOBECTOMY Right    Cyst    FAMILY HISTORY: Family History  Problem Relation Age of Onset  . Hypertension Mother   . Heart disease Mother   . Diabetes Mother   . Kidney disease Mother   . Arthritis Mother   .  Hypertension Father   . Hypertension Paternal Aunt   . Kidney disease Paternal Aunt   . Hypertension Son     SOCIAL HISTORY:  Social History   Social History  . Marital status: Widowed    Spouse name: N/A  . Number of children: 3  . Years of education: 60   Occupational History  . Unemployed    Social History Main Topics  . Smoking status: Former Smoker    Packs/day: 1.00    Years: 30.00    Types: Cigarettes    Quit date: 11/13/2012  . Smokeless tobacco: Never Used  . Alcohol use No  . Drug use: No  . Sexual activity: Not on file   Other Topics Concern  . Not on file   Social History Narrative   Lives with son, aunt   Caffeine - coffee 1 cup daily     PHYSICAL EXAM  GENERAL EXAM/CONSTITUTIONAL: Vitals:  Vitals:   08/09/16 0926  BP: 132/72  Pulse: 65  Weight: 198 lb 9.6 oz (90.1 kg)  Height: 5\' 7"  (1.702 m)     Body mass index is 31.11 kg/m.  Visual Acuity Screening   Right eye Left eye Both eyes  Without correction:     With correction:  20/30 20/30      Patient is in no distress; well developed, nourished and groomed; neck is supple  MALAISE APPEARANCE; SOFT SPOKEN; FLAT AFFECT  CARDIOVASCULAR:  Examination of carotid arteries is normal; no carotid bruits  Regular rate and rhythm, no murmurs  Examination of peripheral vascular system by observation and palpation is normal  EYES:  Ophthalmoscopic exam of optic discs and posterior segments is normal; no papilledema or hemorrhages  MUSCULOSKELETAL:  Gait, strength, tone, movements noted in Neurologic exam below  NEUROLOGIC: MENTAL STATUS:  MMSE - Mini Mental State Exam 08/13/2015 05/28/2015  Orientation to time 5 2  Orientation to Place 5 4  Registration 3 3  Attention/ Calculation 3 5  Recall 2 2  Language- name 2 objects 2 2  Language- repeat 1 0  Language- follow 3 step command 3 3  Language- read & follow direction 1 1  Write a sentence 1 1  Copy design 1 0  Total score 27 23    awake, alert, oriented to person, place and time  recent and remote memory intact  normal attention and concentration  language fluent, comprehension intact, naming intact,   fund of knowledge appropriate  CRANIAL NERVE:   2nd - no papilledema on fundoscopic exam  2nd, 3rd, 4th, 6th - pupils equal and reactive to light, visual fields full to confrontation, extraocular muscles intact, no nystagmus  5th - facial sensation symmetric  7th - facial strength symmetric  8th - hearing intact  9th - palate elevates symmetrically, uvula midline  11th - shoulder shrug symmetric  12th - tongue protrusion midline  MOTOR:   normal bulk and tone, full strength in the BUE, BLE  SENSORY:   normal and symmetric to light touch, temperature, vibration  COORDINATION:   finger-nose-finger, fine finger movements normal  REFLEXES:   deep tendon reflexes TRACE and symmetric  GAIT/STATION:   narrow based gait; romberg is negative    DIAGNOSTIC DATA (LABS,  IMAGING, TESTING) - I reviewed patient records, labs, notes, testing and imaging myself where available.  Lab Results  Component Value Date   WBC 4.3 07/29/2016   HGB 12.4 07/29/2016   HCT 39.1 07/29/2016   MCV 97.0 07/29/2016   PLT 373  07/29/2016      Component Value Date/Time   NA 138 07/29/2016 1103   NA 145 (H) 07/15/2016 1553   K 4.4 07/29/2016 1103   CL 106 07/29/2016 1103   CO2 22 07/29/2016 1103   GLUCOSE 74 07/29/2016 1103   BUN 22 (H) 07/29/2016 1103   BUN 34 (H) 07/15/2016 1553   CREATININE 1.35 (H) 07/29/2016 1103   CALCIUM 9.0 07/29/2016 1103   PROT 7.3 07/29/2016 1103   PROT 7.0 04/19/2016 1616   ALBUMIN 4.0 07/29/2016 1103   ALBUMIN 4.1 04/19/2016 1616   AST 22 07/29/2016 1103   ALT 25 07/29/2016 1103   ALKPHOS 145 (H) 07/29/2016 1103   BILITOT 0.5 07/29/2016 1103   BILITOT <0.2 04/19/2016 1616   GFRNONAA 41 (L) 07/29/2016 1103   GFRAA 47 (L) 07/29/2016 1103   Lab Results  Component Value Date   CHOL 149 04/19/2016   HDL 78 04/19/2016   LDLCALC 53 04/19/2016   TRIG 91 04/19/2016   CHOLHDL 1.9 04/19/2016   No results found for: HGBA1C Lab Results  Component Value Date   IOEVOJJK09 381 09/14/2015   Lab Results  Component Value Date   TSH 0.393 09/13/2015    08/20/13 - 08/21/13: [place] EMU admission  The patient was admitted into the epilepsy monitoring unit and continuous video EEG monitoring was instituted. Her Keppra was gently decreased and the patient had 2 clinical events. With these events, the patient demonstrated tonic, flexed posturing and a generalized tremor. She also demonstrated loss of awareness. With review of the EEG, there were no epileptiform changes associated with these events. It was felt that these events represented nonepileptic etiologies, and likely psychological causes. These findings were shared with the patient, it was determined that therapy may be beneficial. The patient also had a elevated potassium level, but repeat  study only demonstrated an abnormality of 5.4. At this level, it was felt that treatment was not necessary. The patient did not condone any symptoms associated with hyperkalemia. The patient will follow up in 4 weeks in our clinic and we will help facilitate a referral to a therapist. The patient was also given instructions to wean off of her Keppra.  02/18/15 - 02/24/15 [facility] # Hx of partial seizures # possible seizure activity on 02/21/15 Neurology was consulted and Keppra 500 mg by mouth twice a day was started. EEG was negative. She is stable upon discharge.   02/18/15 EEG  - normal EEG awake and drowsy  02/18/15 MRI / MRA brain  - no acute intracranial abnormality - unremarkable circle of willis   09/14/15 MRI brain [I reviewed images myself and agree with interpretation. -VRP]  1. No acute intracranial abnormality. 2. Mild chronic small vessel ischemic disease.  11/30/15 EEG - normal EEG awake and asleep  07/04/16 CT head  - no acute intracranial abnormality     ASSESSMENT AND PLAN  65 y.o. year old female here with seizures versus pseudoseizures, chronic migraine, chronic renal insufficiency here for evaluation of seizure/spells and headache management. Chronic migraine (15 or more days of headache): tried and failed betablockers (Atenolol), antiepileptics (Keppra, Topamax, Gabapentin ), and antidepressants (Cymbalta), previously eval'd by Dr. Joretta Bachelor. Seizure vs non-epileptic spells previously eval'd in EMU at [facility] with Dr. Amalia Hailey.    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 Seizure disorder ([place])   3. Chronic migraine      PLAN:  SEIZURES vs PSEUDOSEIZURES - follow up with epilepsy specialist that patient has  seen in the past (Dr. Ernest Haber) for evaluation of epilepsy vs non-epileptic spells; patient may need repeat EMU evaluation; continue levetiracetam 500mg  twice a day for now - continue psychiatry/psychology follow up  HEADACHES (chronic  migraine) - continue topiramate 15mg  twice a day - consider restarting botox with Dr. Joretta Bachelor - offered migraine infusion for patient today (patient declined due to transportation issues and report of poor IV access)  POLYPHARMACY - consider stopping baclofen (not effective) - consider stopping donepezil (patient does not have dementia) - consider consolidating celexa and cymbalta (patient probably should be on only 1 of these medications)  Return if symptoms worsen or fail to improve, for return to PCP.  I reviewed images, labs, notes, records myself. I summarized findings and reviewed with patient, for this high risk condition (seizures, headaches, polypharmacy) requiring high complexity decision making.    Penni Bombard, MD 8/88/7579, 72:82 AM Certified in Neurology, Neurophysiology and Neuroimaging  [facility] [address] [place], [place] [address] [phone]

## 2016-08-09 NOTE — Patient Instructions (Addendum)
Thank you for coming to see [place] at [facility]. I hope we have been able to provide you high quality care today.  You may receive a patient satisfaction survey over the next few weeks. We would appreciate your feedback and comments so that we may continue to improve ourselves and the health of our patients.  SEIZURES vs PSEUDOSEIZURES - follow up with epilepsy specialist (Dr. Ernest Haber) for evaluation of epilepsy vs non-epileptic spells - continue levetiracetam 576m twice a day for now - continue psychiatry/psychology follow up  HEADACHES (chronic migraine) - continue topiramate 146mtwice a day - consider restarting botox with Dr. HaJoretta BachelorPOLYPHARMACY - discuss the following changes with your medical doctor and psychiatrist - consider stopping baclofen (not effective) - consider stopping donepezil (you do not have dementia) - consider consolidating celexa and cymbalta (you should probably only be on 1 of these medications)   ~~~~~~~~~~~~~~~~~~~~~~~~~~~~~~~~~~~~~~~~~~~~~~~~~~~~~~~~~~~~~~~~~  DR. Raigan Baria'S GUIDE TO HAPPY AND HEALTHY LIVING These are some of my general health and wellness recommendations. Some of them may apply to you better than others. Please use common sense as you try these suggestions and feel free to ask me any questions.   ACTIVITY/FITNESS Mental, social, emotional and physical stimulation are very important for brain and body health. Try learning a new activity (arts, music, language, sports, games).  Keep moving your body to the best of your abilities. You can do this at home, inside or outside, the park, community center, gym or anywhere you like. Consider a physical therapist or personal trainer to get started. Consider the app Sworkit. Fitness trackers such as smart-watches, smart-phones or Fitbits can help as well.   NUTRITION Eat more plants: colorful vegetables, nuts, seeds and berries.  Eat less sugar, salt, preservatives and  processed foods.  Avoid toxins such as cigarettes and alcohol.  Drink water when you are thirsty. Warm water with a slice of lemon is an excellent morning drink to start the day.  Consider these websites for more information The Nutrition Source (hthttps://www.henry-hernandez.biz/[employer] Nutrition (wwWindowBlog.ch  RELAXATION Consider practicing mindfulness meditation or other relaxation techniques such as deep breathing, prayer, yoga, tai chi, massage. See website mindful.org or the apps Headspace or Calm to help get started.   SLEEP Try to get at least 7-8+ hours sleep per day. Regular exercise and reduced caffeine will help you sleep better. Practice good sleep hygeine techniques. See website sleep.org for more information.   PLANNING Prepare estate planning, living will, healthcare POA documents. Sometimes this is best planned with the help of an attorney. Theconversationproject.org and agingwithdignity.org are excellent resources.

## 2016-08-10 ENCOUNTER — Ambulatory Visit (HOSPITAL_COMMUNITY)
Admission: RE | Admit: 2016-08-10 | Discharge: 2016-08-10 | Disposition: A | Discharge status: Home / Self Care | Payer: Medicare Other | Source: Ambulatory Visit | Attending: Cardiovascular Disease | Admitting: Cardiovascular Disease

## 2016-08-10 DIAGNOSIS — F488 Other specified nonpsychotic mental disorders: Secondary | ICD-10-CM

## 2016-08-10 DIAGNOSIS — I10 Essential (primary) hypertension: Secondary | ICD-10-CM | POA: Insufficient documentation

## 2016-08-10 DIAGNOSIS — Z87891 Personal history of nicotine dependence: Secondary | ICD-10-CM | POA: Insufficient documentation

## 2016-08-10 DIAGNOSIS — I071 Rheumatic tricuspid insufficiency: Secondary | ICD-10-CM | POA: Diagnosis not present

## 2016-08-10 DIAGNOSIS — R002 Palpitations: Secondary | ICD-10-CM | POA: Insufficient documentation

## 2016-08-10 DIAGNOSIS — R55 Syncope and collapse: Secondary | ICD-10-CM

## 2016-08-10 DIAGNOSIS — J449 Chronic obstructive pulmonary disease, unspecified: Secondary | ICD-10-CM | POA: Diagnosis not present

## 2016-08-10 LAB — ECHOCARDIOGRAM COMPLETE
E decel time: 250 msec
E/e' ratio: 11.7
FS: 51 % — AB (ref 28–44)
IVS/LV PW RATIO, ED: 1
LA ID, A-P, ES: 38 mm
LA diam end sys: 38 mm
LA diam index: 1.82 cm/m2
LA vol A4C: 30.8 ml
LV E/e' medial: 11.7
LV E/e'average: 11.7
LV PW d: 10.9 mm — AB (ref 0.6–1.1)
LV e' LATERAL: 9.57 cm/s
LVOT area: 3.14 cm2
LVOT diameter: 20 mm
MV Dec: 250
MV Peak grad: 5 mmHg
MV pk A vel: 99 m/s
MV pk E vel: 112 m/s
RV sys press: 33 mmHg
Reg peak vel: 272 cm/s
TAPSE: 12.9 mm
TDI e' lateral: 9.57
TDI e' medial: 6.2
TR max vel: 272 cm/s
VTI: 206 cm

## 2016-08-10 NOTE — Progress Notes (Signed)
*  PRELIMINARY RESULTS* Echocardiogram 2D Echocardiogram has been performed.  Leavy Cella 08/10/2016, 4:05 PM

## 2016-08-11 ENCOUNTER — Telehealth: Payer: Self-pay | Admitting: Pediatrics

## 2016-08-11 ENCOUNTER — Other Ambulatory Visit: Payer: Self-pay

## 2016-08-11 ENCOUNTER — Telehealth: Payer: Self-pay | Admitting: *Deleted

## 2016-08-11 DIAGNOSIS — G8929 Other chronic pain: Secondary | ICD-10-CM | POA: Diagnosis not present

## 2016-08-11 DIAGNOSIS — M549 Dorsalgia, unspecified: Secondary | ICD-10-CM | POA: Diagnosis not present

## 2016-08-11 DIAGNOSIS — J449 Chronic obstructive pulmonary disease, unspecified: Secondary | ICD-10-CM | POA: Diagnosis not present

## 2016-08-11 DIAGNOSIS — Z888 Allergy status to other drugs, medicaments and biological substances status: Secondary | ICD-10-CM | POA: Diagnosis not present

## 2016-08-11 DIAGNOSIS — G4700 Insomnia, unspecified: Secondary | ICD-10-CM | POA: Diagnosis not present

## 2016-08-11 DIAGNOSIS — D472 Monoclonal gammopathy: Secondary | ICD-10-CM | POA: Diagnosis not present

## 2016-08-11 DIAGNOSIS — Z79899 Other long term (current) drug therapy: Secondary | ICD-10-CM | POA: Diagnosis not present

## 2016-08-11 DIAGNOSIS — Z87898 Personal history of other specified conditions: Secondary | ICD-10-CM | POA: Diagnosis not present

## 2016-08-11 DIAGNOSIS — E875 Hyperkalemia: Secondary | ICD-10-CM | POA: Diagnosis not present

## 2016-08-11 DIAGNOSIS — G43909 Migraine, unspecified, not intractable, without status migrainosus: Secondary | ICD-10-CM | POA: Diagnosis not present

## 2016-08-11 DIAGNOSIS — Z882 Allergy status to sulfonamides status: Secondary | ICD-10-CM | POA: Diagnosis not present

## 2016-08-11 DIAGNOSIS — Z7951 Long term (current) use of inhaled steroids: Secondary | ICD-10-CM | POA: Diagnosis not present

## 2016-08-11 DIAGNOSIS — Z886 Allergy status to analgesic agent status: Secondary | ICD-10-CM | POA: Diagnosis not present

## 2016-08-11 DIAGNOSIS — N183 Chronic kidney disease, stage 3 (moderate): Secondary | ICD-10-CM | POA: Diagnosis not present

## 2016-08-11 DIAGNOSIS — Z885 Allergy status to narcotic agent status: Secondary | ICD-10-CM | POA: Diagnosis not present

## 2016-08-11 DIAGNOSIS — I129 Hypertensive chronic kidney disease with stage 1 through stage 4 chronic kidney disease, or unspecified chronic kidney disease: Secondary | ICD-10-CM | POA: Diagnosis not present

## 2016-08-11 DIAGNOSIS — K219 Gastro-esophageal reflux disease without esophagitis: Secondary | ICD-10-CM | POA: Diagnosis not present

## 2016-08-11 DIAGNOSIS — Z791 Long term (current) use of non-steroidal anti-inflammatories (NSAID): Secondary | ICD-10-CM | POA: Diagnosis not present

## 2016-08-11 NOTE — Telephone Encounter (Signed)
Will forward to [place] as she is her PCP---thanks will call and cancel Pv and colon with patient   Marijean Niemann

## 2016-08-11 NOTE — Telephone Encounter (Signed)
It sounds like this patient is not appropriate for a screening colonoscopy at this time because the potential risks of sedation with ongoing seizure activity are greater than the expected benefits.  Therefore, please cancel the procedure and previsit and get this message to primary care.  They can refer the patient back to [place] when her seizure condition is controlled.   Wilfrid Lund, MD

## 2016-08-11 NOTE — Telephone Encounter (Signed)
Dr Loletha Carrow,  This pt was previously scheduled to have a colon with you in July and was cancelled.  She has a hx of seizures and was cleared back in the summer with Jenny Reichmann. Since the last TE, she has had some recurrent seizure activity.  The week of 07-04-16 she had 3 seizures and the last noted was 07-08-2016.  She had an Echo yesterday, results are still pending. She is scheduled for a colon 10-9 Monday with a PV 9-25. I was not sure if you wanted her to have an OV now since she has had multiple seizures or if she is okay to proceed as scheduled. Please advise and thanks for your time  [name]

## 2016-08-11 NOTE — Telephone Encounter (Signed)
LM to [facility] with pt's family member- marie PV

## 2016-08-11 NOTE — Telephone Encounter (Signed)
Spoke with pt- informed need to cancel PV and colon until after her seizures are under control and the reasons. Pt verbalized understanding of all this and states she will call and RS at a later date/ time.  [name] PV

## 2016-08-11 NOTE — Telephone Encounter (Signed)
Put through to Dr Evette Doffing

## 2016-08-12 ENCOUNTER — Other Ambulatory Visit: Payer: Self-pay | Admitting: Pediatrics

## 2016-08-12 MED ORDER — HYDROXYZINE HCL 10 MG PO TABS: 10.0000 mg | ORAL_TABLET | Freq: Three times a day (TID) | ORAL | 1 refills | Status: DC | PRN

## 2016-08-12 MED ORDER — BUTALBITAL-APAP-CAFFEINE 50-325-40 MG PO TABS: ORAL_TABLET | ORAL | 0 refills | Status: DC

## 2016-08-12 NOTE — Telephone Encounter (Signed)
Patient aware that Rx is ready for pick up 

## 2016-08-25 DIAGNOSIS — R51 Headache: Secondary | ICD-10-CM | POA: Diagnosis not present

## 2016-08-28 NOTE — Progress Notes (Signed)
Cardiology Office Note   Date:  08/28/2016   ID:  [name], DOB [birth year], MRN [id number]  PCP:  Chevis Pretty, FNP  Cardiologist: Lamar Sprinkles, NP   Hypertension     History of Present Illness: [name] is a 65 y.o. female who presents for ongoing assessment and management of syncope, hypertension, with history of seizures, chronic dyspnea, CKD Stage III. Was last seen bt Dr. Johnsie Cancel on 07/28/2016. Echocardiogram was ordered.   Left ventricle: The cavity size was normal. Wall thickness was   increased in a pattern of mild LVH. Systolic function was normal.   The estimated ejection fraction was in the range of 55% to 60%.   Wall motion was normal; there were no regional wall motion   abnormalities. Features are consistent with a pseudonormal left   ventricular filling pattern, with concomitant abnormal relaxation   and increased filling pressure (grade 2 diastolic dysfunction).   Doppler parameters are consistent with high ventricular filling   pressure. - Mitral valve: There was mild regurgitation. - Right ventricle: Systolic function was mildly reduced. - Atrial septum: A patent foramen ovale cannot be excluded. - Tricuspid valve: There was mild regurgitation. - Pulmonary arteries: PA peak pressure: 33 mm Hg (S).  She also had a cardiac monitor to evaluate for self-reported palpitations. This revealed normal sinus rhythm without arrhythmias.  She is here today without recurrent symptoms with exception of chronic headaches and seizures. She is continued to be followed by neurology.   Past Medical History:  Diagnosis Date  . Chronic headaches   . CKD (chronic kidney disease) stage 3, GFR 30-59 ml/min   . Depression   . Encephalopathy   . Essential hypertension   . Hyperlipidemia   . 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 Osteoporosis   . Restrictive lung disease   . Right leg weakness   . Seizures [facility])    first sz age 43, last seizure Aug 2017  . URI (upper respiratory infection)     Past Surgical History:  Procedure Laterality Date  . ABDOMINAL HYSTERECTOMY    . APPENDECTOMY    . BACK SURGERY  2002  . BREAST REDUCTION SURGERY    . CHOLECYSTECTOMY    . LUNG LOBECTOMY Right    Cyst     Current Outpatient Prescriptions  Medication Sig Dispense Refill  . acetaminophen (TYLENOL) 500 MG tablet Take 1,000 mg by mouth every 6 (six) hours as needed for moderate pain.    [name] amLODipine (NORVASC) 10 MG tablet Take 1 tablet (10 mg total) by mouth daily. for blood pressure 30 tablet 5  . aspirin EC 81 MG tablet Take 81 mg by mouth daily.    . baclofen (LIORESAL) 10 MG tablet Take 10 mg by mouth 3 (three) times daily.    . butalbital-acetaminophen-caffeine (FIORICET, ESGIC) 50-325-40 MG tablet TAKE 1 TO 2 TABLETS BY MOUTH EVERY 6 HOURS AS NEEDED FOR HEADACHES 20 tablet 0  . calcitRIOL (ROCALTROL) 0.5 MCG capsule Take 0.5 mcg by mouth daily.     . calcium acetate (PHOSLO) 667 MG capsule Take 1 capsule by mouth 3 (three) times daily.    . cholecalciferol (VITAMIN D) 1000 UNITS tablet Take 1,000 Units by mouth daily.    . citalopram (CELEXA) 20 MG tablet Take 1 tablet (20 mg total) by mouth daily. 30 tablet 5  . cyclobenzaprine (FLEXERIL) 5 MG tablet TAKE 1 TABLET BY MOUTH THREE TIMES DAILY AS NEEDED FOR MUSCLE SPASMS  30 tablet 0  . donepezil (ARICEPT) 10 MG tablet Take 1 tablet by mouth at bedtime FOR MEMORY. 30 tablet 5  . DULoxetine (CYMBALTA) 60 MG capsule Take 2 capsules (120 mg total) by mouth daily. 30 capsule 5  . ferrous sulfate 325 (65 FE) MG tablet Take 325 mg by mouth daily with breakfast.    . fluticasone (FLONASE) 50 MCG/ACT nasal spray Place 2 sprays into both nostrils daily. 16 g 2  . Fluticasone-Salmeterol (ADVAIR) 100-50 MCG/DOSE AEPB Inhale 1 puff into the lungs 2 (two) times daily. 1 each 3  . furosemide (LASIX) 20 MG tablet Take 1 tablet (20 mg total) by mouth daily as needed. 30  tablet 5  . gabapentin (NEURONTIN) 600 MG tablet Take 1 tablet (600 mg total) by mouth 3 (three) times daily. 90 tablet 5  . hydrALAZINE (APRESOLINE) 50 MG tablet Take 1 tablet (50 mg total) by mouth 3 (three) times daily. 90 tablet 2  . hydrOXYzine (ATARAX/VISTARIL) 10 MG tablet Take 1 tablet (10 mg total) by mouth 3 (three) times daily as needed. 60 tablet 1  . hydrOXYzine (ATARAX/VISTARIL) 10 MG tablet TAKE 1 TABLET BY MOUTH THREE TIMES DAILY AS NEEDED 60 tablet 1  . Ipratropium-Albuterol (COMBIVENT RESPIMAT) 20-100 MCG/ACT AERS respimat INHALE 2 PUFFS INTO THE LUNGS TWICE A DAY 4 g 4  . levETIRAcetam (KEPPRA) 500 MG tablet Take 1 tablet (500 mg total) by mouth 2 (two) times daily. 60 tablet 5  . pantoprazole (PROTONIX) 40 MG tablet Take 1 tablet (40 mg total) by mouth daily. 30 tablet 5  . pravastatin (PRAVACHOL) 40 MG tablet Take 1 tablet (40 mg total) by mouth daily. 90 tablet 3  . topiramate (TOPAMAX) 15 MG capsule Take 1 capsule (15 mg total) by mouth 2 times daily. 60 capsule 0  . traZODone (DESYREL) 50 MG tablet Take ONE-HALF (0.5) TO ONE (1) tablet (25-50 mg total) by mouth at bedtime as needed for sleep. 30 tablet 5   No current facility-administered medications for this visit.     Allergies:   Rofecoxib; Asa [aspirin]; Penicillins; Sulfonamide derivatives; and Tylenol with codeine #3 [acetaminophen-codeine]    Social History:  The patient  reports that she quit smoking about 3 years ago. Her smoking use included Cigarettes. She has a 30.00 pack-year smoking history. She has never used smokeless tobacco. She reports that she does not drink alcohol or use drugs.   Family History:  The patient's family history includes Arthritis in her mother; Diabetes in her mother; Heart disease in her mother; Hypertension in her father, mother, paternal aunt, and son; Kidney disease in her mother and paternal aunt.    ROS: All other systems are reviewed and negative. Unless otherwise mentioned in  H&P    PHYSICAL EXAM: VS:  There were no vitals taken for this visit. , BMI There is no height or weight on file to calculate BMI. GEN: Well nourished, well developed, in no acute distress  HEENT: normal  Neck: no JVD, carotid bruits, or masses Cardiac: RRR; no murmurs, rubs, or gallops,no edema  Respiratory:  clear to auscultation bilaterally, normal work of breathing GI: soft, nontender, nondistended, + BS MS: no deformity or atrophy  Skin: warm and dry, no rash Neuro:  Strength and sensation are intact Psych: euthymic mood, full affect   EKG: The ekg ordered today demonstrates normal sinus rhythm rate of 65 bpm with rightward axis.   Recent Labs: 09/13/2015: TSH 0.393 10/05/2015: BNP 34.4 07/29/2016: ALT 25; BUN 22;  Creatinine, Ser 1.35; Hemoglobin 12.4; Platelets 373; Potassium 4.4; Sodium 138    Lipid Panel    Component Value Date/Time   CHOL 149 04/19/2016 1616   TRIG 91 04/19/2016 1616   TRIG 132 03/13/2015 1045   HDL 78 04/19/2016 1616   HDL 58 03/13/2015 1045   CHOLHDL 1.9 04/19/2016 1616   LDLCALC 53 04/19/2016 1616      Wt Readings from Last 3 Encounters:  08/09/16 198 lb 9.6 oz (90.1 kg)  07/29/16 193 lb 9.6 oz (87.8 kg)  07/28/16 193 lb (87.5 kg)      Other studies Reviewed: Additional studies/ records that were reviewed today include:  Review of the above records demonstrates:    ASSESSMENT AND PLAN:  1. Hypertension: Echocardiogram reveals normal LV systolic function with grade 2 diastolic dysfunction. I have counseled her on a low-sodium diet, need to be medically compliant with antihypertensive medications to include hydralazine 50 mg twice a day and amlodipine 10 mg daily. We'll see her again in 6 months.  2. Palpitations: Cardiac monitor revealing normal sinus rhythm without arrhythmias. Reassurance given.  Patient is requesting a flu shot today which is provided. Current medicines are reviewed at length with the patient today.    Labs/  tests ordered today include:No orders of the defined types were placed in this encounter.    Disposition:   FU with 6 months  Signed, Jory Sims, NP  08/28/2016 5:37 PM    [facility] [address] [place], [place] [address] Phone: [phone]; Fax: [phone]

## 2016-08-29 ENCOUNTER — Encounter: Payer: Self-pay | Admitting: Adult Health

## 2016-08-29 ENCOUNTER — Ambulatory Visit (INDEPENDENT_AMBULATORY_CARE_PROVIDER_SITE_OTHER): Payer: Medicare Other | Admitting: Adult Health

## 2016-08-29 VITALS — BP 128/68 | HR 68 | Ht 67.0 in | Wt 194.0 lb

## 2016-08-29 DIAGNOSIS — R002 Palpitations: Secondary | ICD-10-CM

## 2016-08-29 DIAGNOSIS — Z23 Encounter for immunization: Secondary | ICD-10-CM

## 2016-08-29 DIAGNOSIS — I10 Essential (primary) hypertension: Secondary | ICD-10-CM | POA: Diagnosis not present

## 2016-08-29 NOTE — Patient Instructions (Signed)
Your physician wants you to follow-up in: 6 Months with Dr. Johnsie Cancel. You will receive a reminder letter in the mail two months in advance. If you don't receive a letter, please call our office to schedule the follow-up appointment.  Your physician recommends that you continue on your current medications as directed. Please refer to the Current Medication list given to you today.  If you need a refill on your cardiac medications before your next appointment, please call your pharmacy.  Thank you for choosing [facility]!

## 2016-08-29 NOTE — Progress Notes (Signed)
Name: [name]    DOB: March 03, 1951  Age: 65 y.o.  MR#: [id number]       PCP:  Chevis Pretty, FNP      Insurance: Payor: [employer] MEDICARE / Plan: [facility] MEDICARE / Product Type: *No Product type* /   CC:   No chief complaint on file.   VS Vitals:   08/29/16 1309  BP: 128/68  Pulse: 68  SpO2: 96%  Weight: 194 lb (88 kg)  Height: 5\' 7"  (1.702 m)    Weights Current Weight  08/29/16 194 lb (88 kg)  08/09/16 198 lb 9.6 oz (90.1 kg)  07/29/16 193 lb 9.6 oz (87.8 kg)    Blood Pressure  BP Readings from Last 3 Encounters:  08/29/16 128/68  08/09/16 132/72  07/29/16 (!) 144/99     Admit date:  (Not on file) Last encounter with RMR:  Visit date not found   Allergy Rofecoxib; Asa [aspirin]; Penicillins; Sulfonamide derivatives; and Tylenol with codeine #3 [acetaminophen-codeine]  Current Outpatient Prescriptions  Medication Sig Dispense Refill  . acetaminophen (TYLENOL) 500 MG tablet Take 1,000 mg by mouth every 6 (six) hours as needed for moderate pain.    [name] amLODipine (NORVASC) 10 MG tablet Take 1 tablet (10 mg total) by mouth daily. for blood pressure 30 tablet 5  . aspirin EC 81 MG tablet Take 81 mg by mouth daily.    . baclofen (LIORESAL) 10 MG tablet Take 10 mg by mouth 3 (three) times daily.    . butalbital-acetaminophen-caffeine (FIORICET, ESGIC) 50-325-40 MG tablet TAKE 1 TO 2 TABLETS BY MOUTH EVERY 6 HOURS AS NEEDED FOR HEADACHES 20 tablet 0  . calcitRIOL (ROCALTROL) 0.5 MCG capsule Take 0.5 mcg by mouth daily.     . calcium acetate (PHOSLO) 667 MG capsule Take 1 capsule by mouth 3 (three) times daily.    . cholecalciferol (VITAMIN D) 1000 UNITS tablet Take 1,000 Units by mouth daily.    . citalopram (CELEXA) 20 MG tablet Take 1 tablet (20 mg total) by mouth daily. 30 tablet 5  . cyclobenzaprine (FLEXERIL) 5 MG tablet TAKE 1 TABLET BY MOUTH THREE TIMES DAILY AS NEEDED FOR MUSCLE SPASMS 30 tablet 0  . donepezil (ARICEPT) 10 MG tablet Take 1 tablet by  mouth at bedtime FOR MEMORY. 30 tablet 5  . DULoxetine (CYMBALTA) 60 MG capsule Take 2 capsules (120 mg total) by mouth daily. 30 capsule 5  . ferrous sulfate 325 (65 FE) MG tablet Take 325 mg by mouth daily with breakfast.    . fluticasone (FLONASE) 50 MCG/ACT nasal spray Place 2 sprays into both nostrils daily. 16 g 2  . Fluticasone-Salmeterol (ADVAIR) 100-50 MCG/DOSE AEPB Inhale 1 puff into the lungs 2 (two) times daily. 1 each 3  . furosemide (LASIX) 20 MG tablet Take 1 tablet (20 mg total) by mouth daily as needed. 30 tablet 5  . gabapentin (NEURONTIN) 600 MG tablet Take 1 tablet (600 mg total) by mouth 3 (three) times daily. 90 tablet 5  . hydrALAZINE (APRESOLINE) 50 MG tablet Take 1 tablet (50 mg total) by mouth 3 (three) times daily. 90 tablet 2  . hydrOXYzine (ATARAX/VISTARIL) 10 MG tablet Take 1 tablet (10 mg total) by mouth 3 (three) times daily as needed. 60 tablet 1  . hydrOXYzine (ATARAX/VISTARIL) 10 MG tablet TAKE 1 TABLET BY MOUTH THREE TIMES DAILY AS NEEDED 60 tablet 1  . Ipratropium-Albuterol (COMBIVENT RESPIMAT) 20-100 MCG/ACT AERS respimat INHALE 2 PUFFS INTO THE LUNGS TWICE A DAY 4  g 4  . levETIRAcetam (KEPPRA) 500 MG tablet Take 1 tablet (500 mg total) by mouth 2 (two) times daily. 60 tablet 5  . pantoprazole (PROTONIX) 40 MG tablet Take 1 tablet (40 mg total) by mouth daily. 30 tablet 5  . pravastatin (PRAVACHOL) 40 MG tablet Take 1 tablet (40 mg total) by mouth daily. 90 tablet 3  . topiramate (TOPAMAX) 15 MG capsule Take 1 capsule (15 mg total) by mouth 2 times daily. 60 capsule 0  . traZODone (DESYREL) 50 MG tablet Take ONE-HALF (0.5) TO ONE (1) tablet (25-50 mg total) by mouth at bedtime as needed for sleep. 30 tablet 5   No current facility-administered medications for this visit.     Discontinued Meds:   There are no discontinued medications.  Patient Active Problem List   Diagnosis Date Noted  . Memory disturbance 04/19/2016  . BMI 31.0-31.9,adult 04/19/2016   . Chronic migraine 02/17/2016  . COPD (chronic obstructive pulmonary disease) ([place]) 10/20/2015  . Osteoarthritis of right knee 10/20/2015  . Chronic kidney disease 10/06/2015  . Chronic diastolic congestive heart failure ([place]) 10/06/2015  . Nausea vomiting and diarrhea   . Gastroesophageal reflux disease without esophagitis 03/13/2015  . Neuropathy ([place]) 03/13/2015  . 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 Insomnia 03/25/2013  . Depression 03/25/2013  . Seizures ([place]) 03/25/2013  . 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 Essential hypertension 04/21/2010  . DOE (dyspnea on exertion) 04/21/2010  . CHEST PAIN-UNSPECIFIED 04/21/2010    LABS    Component Value Date/Time   NA 138 07/29/2016 1103   NA 145 (H) 07/15/2016 1553   NA 141 06/13/2016 1128   NA 141 04/19/2016 1616   K 4.4 07/29/2016 1103   K 6.9 (>) 07/15/2016 1553   K 5.7 (H) 06/13/2016 1128   CL 106 07/29/2016 1103   CL 111 (H) 07/15/2016 1553   CL 103 06/13/2016 1128   CO2 22 07/29/2016 1103   CO2 10 (L) 07/15/2016 1553   CO2 21 06/13/2016 1128   GLUCOSE 74 07/29/2016 1103   GLUCOSE 76 07/15/2016 1553   GLUCOSE 66 06/13/2016 1128   GLUCOSE 88 04/19/2016 1616   GLUCOSE 94 12/11/2015 1008   GLUCOSE 111 (H) 09/20/2015 1516   BUN 22 (H) 07/29/2016 1103   BUN 34 (H) 07/15/2016 1553   BUN 34 (H) 06/13/2016 1128   BUN 29 (H) 04/19/2016 1616   CREATININE 1.35 (H) 07/29/2016 1103   CREATININE 1.61 (H) 07/15/2016 1553   CREATININE 1.67 (H) 06/13/2016 1128   CALCIUM 9.0 07/29/2016 1103   CALCIUM 9.9 07/15/2016 1553   CALCIUM 9.5 06/13/2016 1128   GFRNONAA 41 (L) 07/29/2016 1103   GFRNONAA 34 (L) 07/15/2016 1553   GFRNONAA 32 (L) 06/13/2016 1128   GFRAA 47 (L) 07/29/2016 1103   GFRAA 39 (L) 07/15/2016 1553   GFRAA 37 (L) 06/13/2016 1128   CMP     Component Value Date/Time   NA 138 07/29/2016 1103   NA 145 (H) 07/15/2016 1553   K 4.4 07/29/2016 1103   CL 106 07/29/2016 1103   CO2 22  07/29/2016 1103   GLUCOSE 74 07/29/2016 1103   BUN 22 (H) 07/29/2016 1103   BUN 34 (H) 07/15/2016 1553   CREATININE 1.35 (H) 07/29/2016 1103   CALCIUM 9.0 07/29/2016 1103   PROT 7.3 07/29/2016 1103   PROT 7.0 04/19/2016 1616   ALBUMIN 4.0 07/29/2016 1103   ALBUMIN 4.1 04/19/2016 1616   AST 22 07/29/2016 1103  ALT 25 07/29/2016 1103   ALKPHOS 145 (H) 07/29/2016 1103   BILITOT 0.5 07/29/2016 1103   BILITOT <0.2 04/19/2016 1616   GFRNONAA 41 (L) 07/29/2016 1103   GFRAA 47 (L) 07/29/2016 1103       Component Value Date/Time   WBC 4.3 07/29/2016 1103   WBC 4.7 12/11/2015 1008   WBC 8.6 09/20/2015 1516   HGB 12.4 07/29/2016 1103   HGB 12.7 12/11/2015 1008   HGB 11.9 (L) 09/20/2015 1516   HCT 39.1 07/29/2016 1103   HCT 39.9 12/11/2015 1008   HCT 37.1 09/20/2015 1516   HCT 40.9 05/29/2015 0801   MCV 97.0 07/29/2016 1103   MCV 97.3 12/11/2015 1008   MCV 98.9 09/20/2015 1516   MCV 95.8 04/10/2015 1244   MCV 95.8 04/25/2014 1725    Lipid Panel     Component Value Date/Time   CHOL 149 04/19/2016 1616   TRIG 91 04/19/2016 1616   TRIG 132 03/13/2015 1045   HDL 78 04/19/2016 1616   HDL 58 03/13/2015 1045   CHOLHDL 1.9 04/19/2016 1616   LDLCALC 53 04/19/2016 1616    ABG    Component Value Date/Time   PHART 7.286 (L) 05/28/2015 1615   PCO2ART 39.4 05/28/2015 1615   PO2ART 64.6 (L) 05/28/2015 1615   HCO3 18.2 (L) 05/28/2015 1615   TCO2 16.8 05/28/2015 1615   ACIDBASEDEF 7.5 (H) 05/28/2015 1615   O2SAT 89.9 05/28/2015 1615     Lab Results  Component Value Date   TSH 0.393 09/13/2015   BNP (last 3 results)  Recent Labs  10/05/15 1547  BNP 34.4    ProBNP (last 3 results) No results for input(s): PROBNP in the last 8760 hours.  Cardiac Panel (last 3 results) No results for input(s): CKTOTAL, CKMB, TROPONINI, RELINDX in the last 72 hours.  Iron/TIBC/Ferritin/ %Sat No results found for: IRON, TIBC, FERRITIN, IRONPCTSAT   EKG Orders placed or performed in  visit on 08/29/16  . 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 Unfortunately there is not a lot to do for this at this time.      COPD (chronic obstructive pulmonary disease) (HCC)     Digestive   Gastroesophageal reflux disease without esophagitis   Nausea vomiting and diarrhea     Nervous and Auditory   Neuropathy (HCC)     Musculoskeletal and Integument   Osteoarthritis of right knee     Genitourinary   Chronic kidney disease     Other   DOE (dyspnea on exertion)   Last Assessment & Plan 08/14/2014 Office Visit Written 08/14/2014 11:38 AM by Kathee Delton, MD    The patient's pulmonary function studies surprisingly shows very little airflow obstruction, and also severe restriction. Her diffusion capacity totally corrects with alveolar volume adjustment. 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 There is  really no pulmonary intervention that is going to make her breathing significantly better, but I have encouraged her to work aggressively on weight loss and also her conditioning. I would be happy to see her back as needed.      CHEST PAIN-UNSPECIFIED   Insomnia   Depression   Seizures (HCC)   MGUS (monoclonal gammopathy of unknown significance)   Memory disturbance   BMI 31.0-31.9,adult       Imaging: No results  found.

## 2016-08-31 DIAGNOSIS — F329 Major depressive disorder, single episode, unspecified: Secondary | ICD-10-CM | POA: Insufficient documentation

## 2016-08-31 DIAGNOSIS — G8929 Other chronic pain: Secondary | ICD-10-CM | POA: Insufficient documentation

## 2016-08-31 DIAGNOSIS — M549 Dorsalgia, unspecified: Secondary | ICD-10-CM

## 2016-08-31 DIAGNOSIS — F419 Anxiety disorder, unspecified: Secondary | ICD-10-CM | POA: Insufficient documentation

## 2016-08-31 DIAGNOSIS — F32A Depression, unspecified: Secondary | ICD-10-CM | POA: Insufficient documentation

## 2016-09-05 ENCOUNTER — Encounter: Payer: Self-pay | Admitting: Gastroenterology

## 2016-09-16 DIAGNOSIS — G43709 Chronic migraine without aura, not intractable, without status migrainosus: Secondary | ICD-10-CM | POA: Diagnosis not present

## 2016-10-01 IMAGING — DX DG BONE SURVEY MET
8 of 10 series · 8 of 10 positions shown · non-contrast
Comparison: Metastatic bone survey of 11/14/2012, and chest x-ray
of 04/11/2015

CLINICAL DATA: Monoclonal gammopathy

EXAM:
METASTATIC BONE SURVEY

[chest pa]
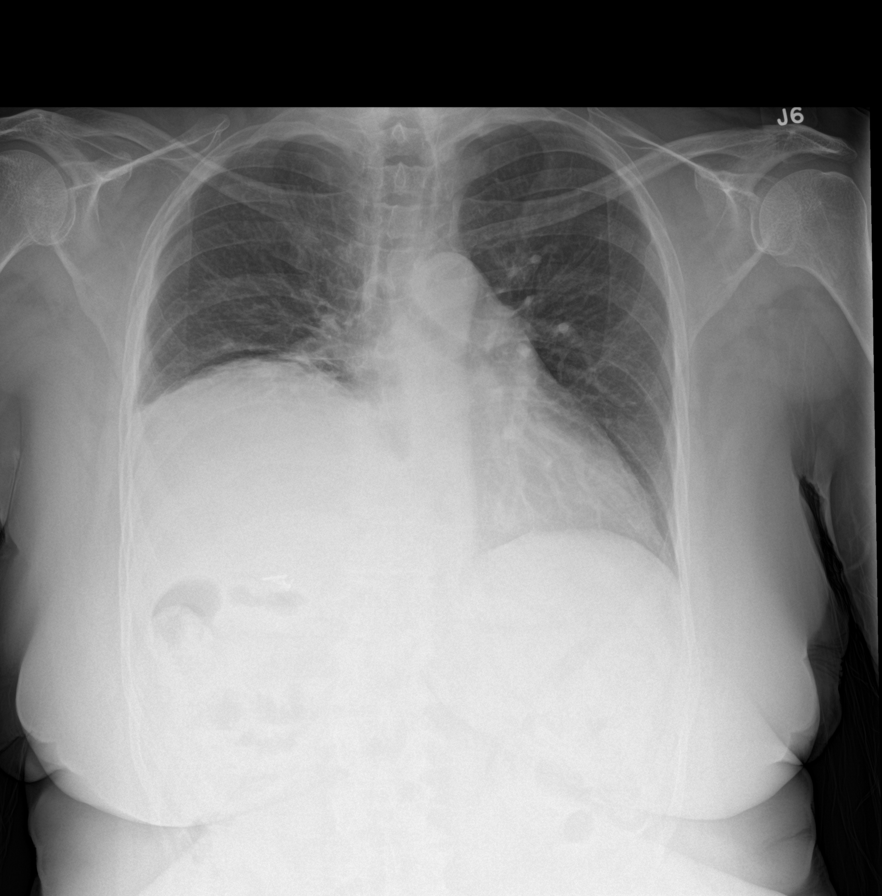

[c-spine lat]
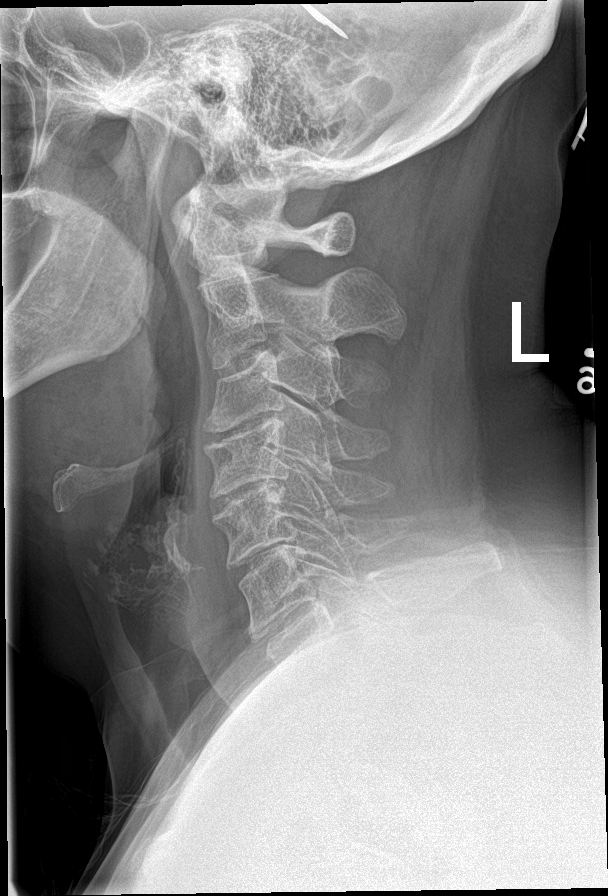

[skull ap]
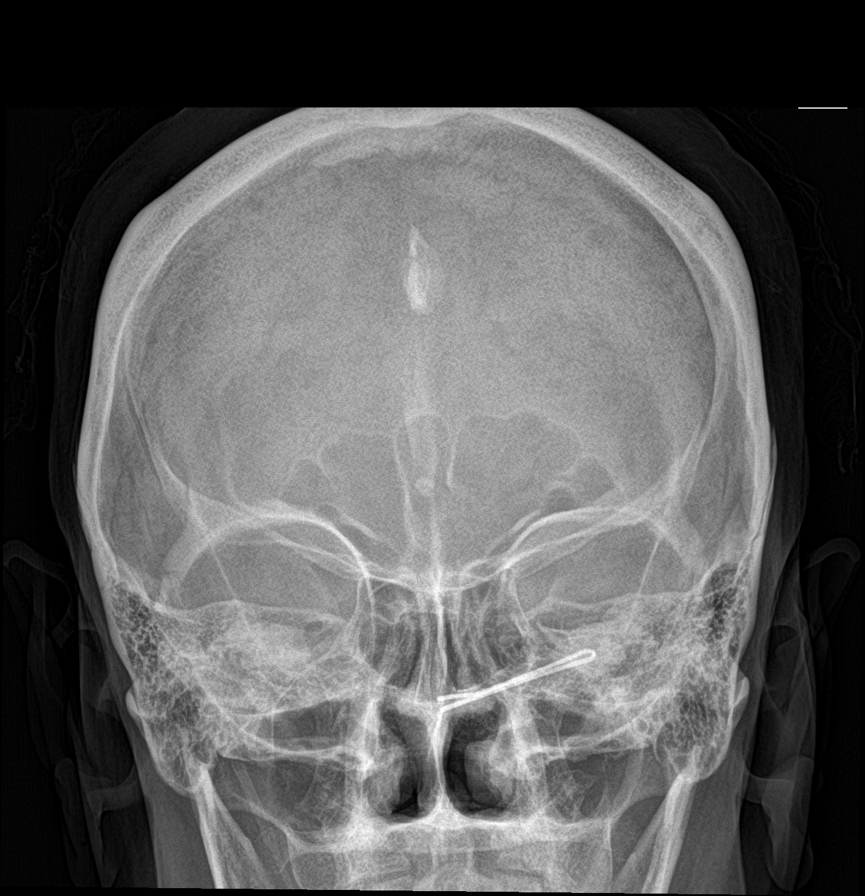

[skull lat]
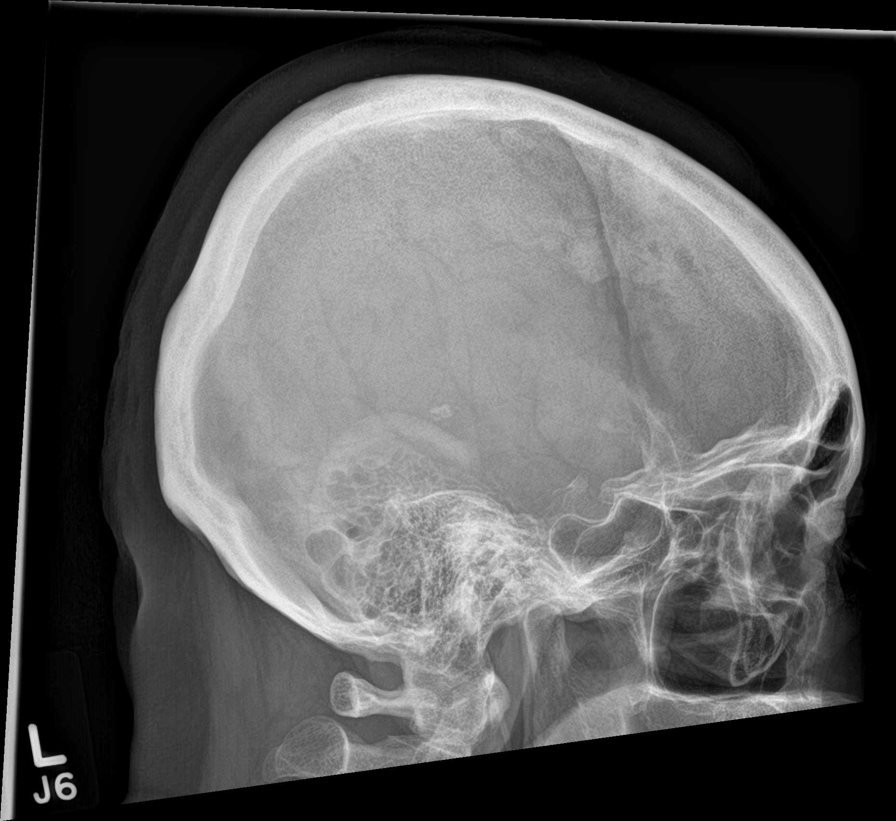

[shoulder ap (1 of 2)]
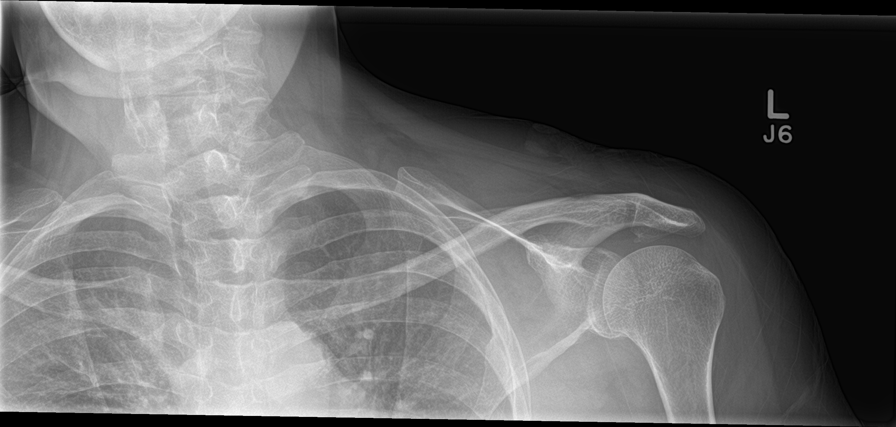

[shoulder ap (2 of 2)]
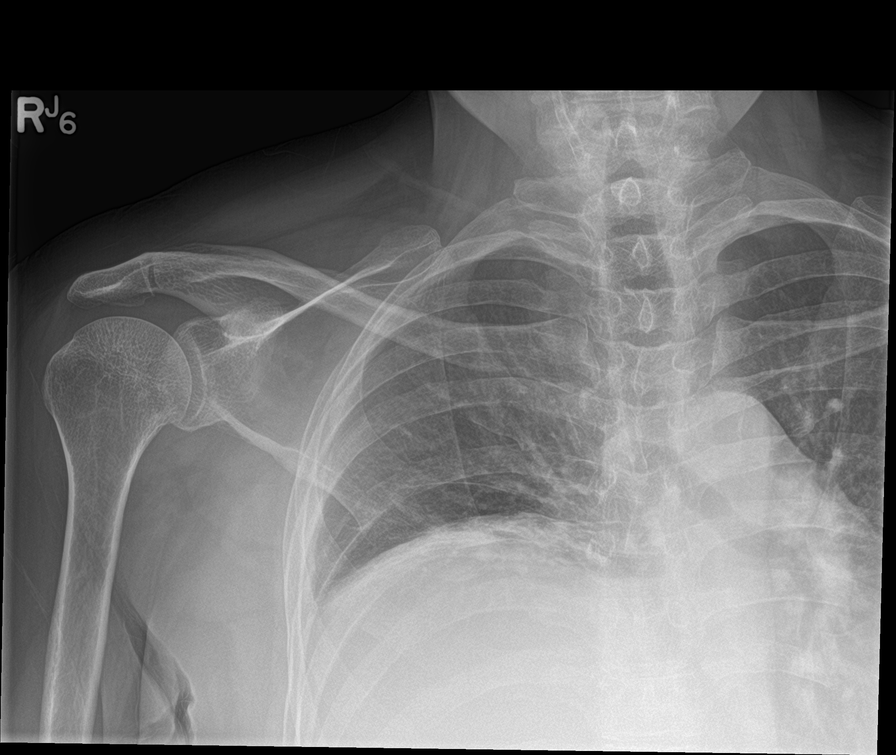

[humerus ap (1 of 2)]
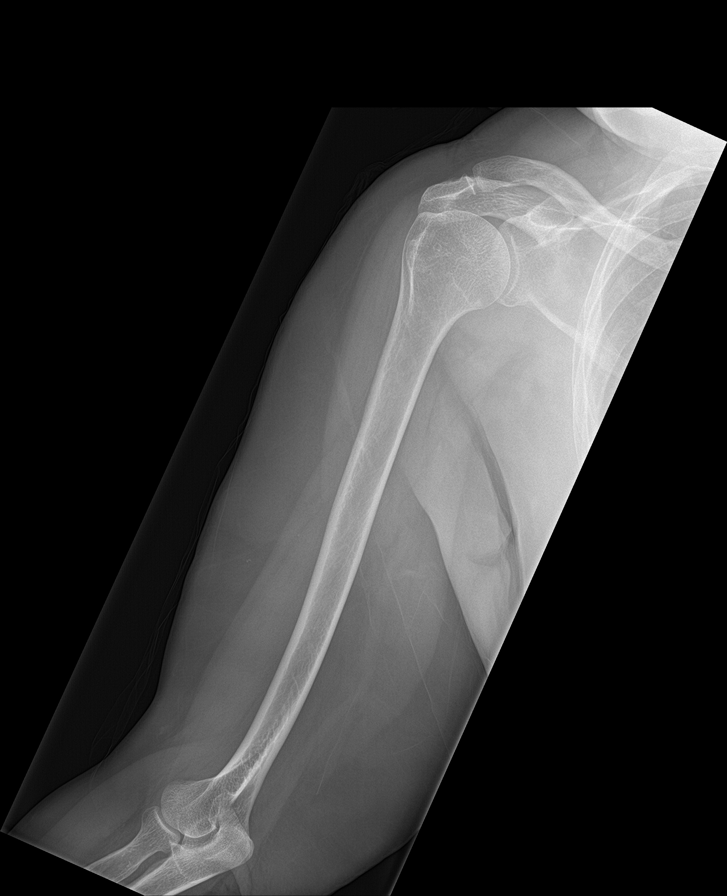

[humerus ap (2 of 2)]
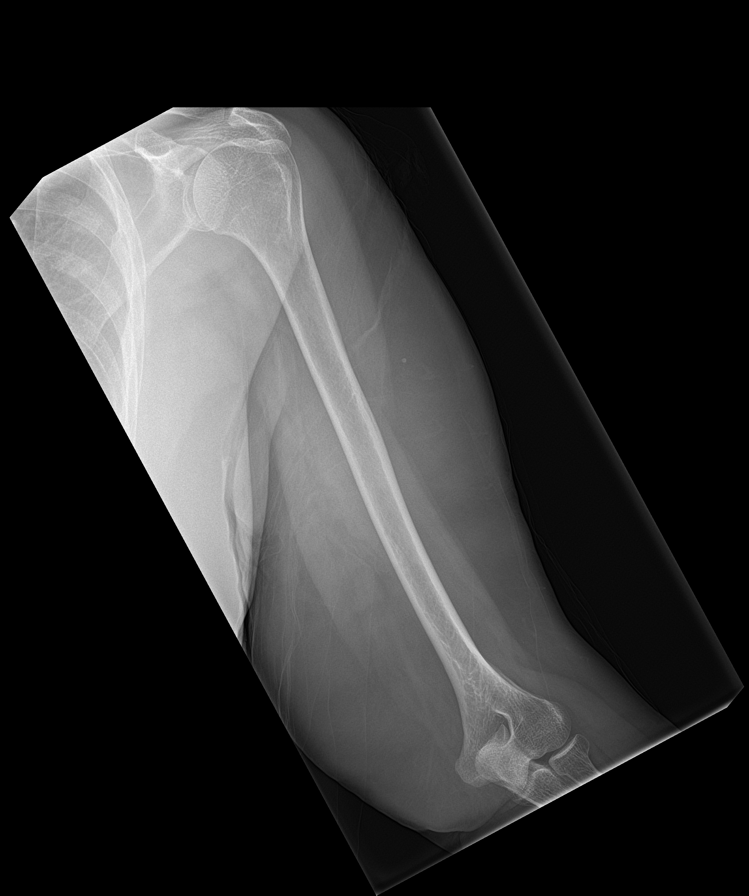

[8 of 10 positions shown; findings below may reference images not displayed]

FINDINGS: No active infiltrate or effusion is seen. There is chronic elevation
of the right hemidiaphragm with right basilar atelectasis present.
Mild cardiomegaly is stable.

A lateral view the skull shows no radiolucent lesion. The cervical
vertebrae remain somewhat straightened alignment with degenerative
disc disease rim C3 to C6 with loss of disc space and sclerosis.

The thoracic vertebrae are in normal alignment. No compression
deformity is seen.

The lumbar vertebrae remain in normal alignment with mild
degenerative change diffusely. No compression deformity is seen.

Views of both shoulders and both humerus show no radiolucent lesion.

In view of the pelvis shows no radiolucent lesion. The hips are in
normal position. The pelvic rami are intact.

Views of both hips and femurs show no radiolucent lesion.

Views of both tibia and fibula as well as the forearms show no
radiolucent lesions.
IMPRESSION: No evidence of multiple myeloma is seen by plain film. There are
diffuse degenerative changes throughout the cervical, thoracic, and
lumbar spine.

## 2016-10-31 ENCOUNTER — Other Ambulatory Visit: Payer: Self-pay | Admitting: Nurse Practitioner

## 2016-10-31 DIAGNOSIS — G43709 Chronic migraine without aura, not intractable, without status migrainosus: Secondary | ICD-10-CM

## 2016-10-31 DIAGNOSIS — IMO0002 Reserved for concepts with insufficient information to code with codable children: Secondary | ICD-10-CM

## 2016-10-31 DIAGNOSIS — G4700 Insomnia, unspecified: Secondary | ICD-10-CM

## 2016-11-15 ENCOUNTER — Other Ambulatory Visit: Payer: Self-pay | Admitting: Nurse Practitioner

## 2016-11-29 ENCOUNTER — Other Ambulatory Visit: Payer: Self-pay | Admitting: Pediatrics

## 2016-11-29 DIAGNOSIS — I10 Essential (primary) hypertension: Secondary | ICD-10-CM

## 2016-11-30 NOTE — Telephone Encounter (Signed)
Message left that she needs to schedule an appointment.

## 2016-11-30 NOTE — Telephone Encounter (Signed)
Last refill without being seen 

## 2016-12-13 ENCOUNTER — Ambulatory Visit (INDEPENDENT_AMBULATORY_CARE_PROVIDER_SITE_OTHER): Payer: Medicare Other | Admitting: Pediatrics

## 2016-12-13 ENCOUNTER — Ambulatory Visit (INDEPENDENT_AMBULATORY_CARE_PROVIDER_SITE_OTHER): Payer: Medicare Other

## 2016-12-13 ENCOUNTER — Telehealth: Payer: Self-pay | Admitting: Pediatrics

## 2016-12-13 VITALS — BP 121/64 | HR 63 | Temp 97.2°F | Ht 67.0 in | Wt 193.0 lb

## 2016-12-13 DIAGNOSIS — I10 Essential (primary) hypertension: Secondary | ICD-10-CM

## 2016-12-13 DIAGNOSIS — G43709 Chronic migraine without aura, not intractable, without status migrainosus: Secondary | ICD-10-CM | POA: Diagnosis not present

## 2016-12-13 DIAGNOSIS — IMO0002 Reserved for concepts with insufficient information to code with codable children: Secondary | ICD-10-CM

## 2016-12-13 DIAGNOSIS — M25551 Pain in right hip: Secondary | ICD-10-CM | POA: Diagnosis not present

## 2016-12-13 DIAGNOSIS — G4700 Insomnia, unspecified: Secondary | ICD-10-CM

## 2016-12-13 MED ORDER — BUTALBITAL-APAP-CAFFEINE 50-325-40 MG PO TABS: ORAL_TABLET | ORAL | 0 refills | Status: DC

## 2016-12-13 MED ORDER — TRAZODONE HCL 50 MG PO TABS: ORAL_TABLET | ORAL | 3 refills | Status: DC

## 2016-12-13 NOTE — Progress Notes (Signed)
  Subjective:   Patient ID: Marcie Mowers, female    DOB: 11/17/1951, 66 y.o.   MRN: [id number] CC: Headache (1 month); Hip Pain (1 week); and Groin Pain (1 week)  HPI: RAIYAH SPEAKMAN is a 66 y.o. female presenting for Headache (1 month); Hip Pain (1 month); and Groin Pain (1 month)  R hip catching and almost falling ongoing past week Hurts a lot when it happens Has been walking more than usual No injury that she knows of hasnt bothered her in the past  Tylenol daily for migraines Has appt with neurology in two weeks for cont botox injections Does think that the botox is helping Now headaches back to every day On topamax, baclofen as well Says fioricet helps some with HA  Insomnia: taking one trazodone nightly Follows with psychiatry Her duloxetine was stopped by psychiatry yesterday  Has loose stools 2-3 times a day for the past week No abd pain, no nausea  HTN: taking meds regularly No lightheadedness, no recent falls  Relevant past medical, surgical, family and social history reviewed. Allergies and medications reviewed and updated. History  Smoking Status  . Former Smoker  . Packs/day: 1.00  . Years: 30.00  . Types: Cigarettes  . Quit date: 11/13/2012  Smokeless Tobacco  . Never Used   ROS: Per HPI   Objective:    BP 121/64   Pulse 63   Temp 97.2 F (36.2 C) (Oral)   Ht 5\' 7"  (1.702 m)   Wt 193 lb (87.5 kg)   BMI 30.23 kg/m   Wt Readings from Last 3 Encounters:  12/13/16 193 lb (87.5 kg)  08/29/16 194 lb (88 kg)  08/09/16 198 lb 9.6 oz (90.1 kg)    Gen: NAD, alert, cooperative with exam, NCAT EYES: EOMI, no conjunctival injection, or no icterus ENT: OP without erythema LYMPH: no cervical LAD CV: NRRR, normal S1/S2, no murmur, distal pulses 2+ b/l Resp: CTABL, no wheezes, normal WOB Abd: +BS, soft, NTND. no guarding or organomegaly Ext: No edema, warm Neuro: Alert and oriented MSK: pain b/l hips with int and ext rotation. R hip with  decreased int/ext rotation compared with L, more pain with internal rotation  Assessment & Plan:  Chevelle was seen today for headache, hip pain and groin pain.  Diagnoses and all orders for this visit:  Essential hypertension Well controlled, cont current meds  Insomnia, unspecified type Some nights harder than others to fall asleep during, continuing  -     traZODone (DESYREL) 50 MG tablet; Take ONE-HALF (0.5) TO ONE (1) tablet (25-50 mg total) by mouth at bedtime as needed for sleep.  Chronic migraine Follows with neurology Discussed minimizing abortive meds, no more than twice a week -     butalbital-acetaminophen-caffeine (FIORICET, ESGIC) 50-325-40 MG tablet; TAKE 1 TO 2 TABLETS BY MOUTH EVERY 6 HOURS AS NEEDED FOR HEADACHES  Pain of right hip joint Will get xray to eval for arthritis Not interested in ortho referral now -     DG HIP UNILAT W OR W/O PELVIS 2-3 VIEWS RIGHT; Future   Follow up plan: 3 mo Assunta Found, MD Early

## 2016-12-14 NOTE — Telephone Encounter (Signed)
Pt is aware of x-ray report of her hip.

## 2016-12-23 ENCOUNTER — Other Ambulatory Visit: Payer: Self-pay | Admitting: Nurse Practitioner

## 2016-12-23 DIAGNOSIS — G43709 Chronic migraine without aura, not intractable, without status migrainosus: Secondary | ICD-10-CM

## 2016-12-23 DIAGNOSIS — IMO0002 Reserved for concepts with insufficient information to code with codable children: Secondary | ICD-10-CM

## 2017-01-06 ENCOUNTER — Other Ambulatory Visit: Payer: Self-pay | Admitting: Nurse Practitioner

## 2017-01-06 DIAGNOSIS — I10 Essential (primary) hypertension: Secondary | ICD-10-CM

## 2017-01-10 ENCOUNTER — Other Ambulatory Visit: Payer: Self-pay | Admitting: Pediatrics

## 2017-01-26 ENCOUNTER — Other Ambulatory Visit (HOSPITAL_COMMUNITY): Payer: Self-pay

## 2017-01-26 ENCOUNTER — Ambulatory Visit (HOSPITAL_COMMUNITY): Payer: Self-pay

## 2017-02-02 ENCOUNTER — Ambulatory Visit (INDEPENDENT_AMBULATORY_CARE_PROVIDER_SITE_OTHER): Payer: Medicare Other | Admitting: Pediatrics

## 2017-02-02 ENCOUNTER — Ambulatory Visit (INDEPENDENT_AMBULATORY_CARE_PROVIDER_SITE_OTHER): Payer: Medicare Other

## 2017-02-02 ENCOUNTER — Encounter: Payer: Self-pay | Admitting: Pediatrics

## 2017-02-02 VITALS — BP 122/62 | HR 73 | Temp 98.0°F | Ht 67.0 in | Wt 199.6 lb

## 2017-02-02 DIAGNOSIS — Z23 Encounter for immunization: Secondary | ICD-10-CM

## 2017-02-02 DIAGNOSIS — R1032 Left lower quadrant pain: Secondary | ICD-10-CM

## 2017-02-02 DIAGNOSIS — M76892 Other specified enthesopathies of left lower limb, excluding foot: Secondary | ICD-10-CM | POA: Diagnosis not present

## 2017-02-02 NOTE — Patient Instructions (Signed)
Stretch throughout the day

## 2017-02-02 NOTE — Progress Notes (Signed)
  Subjective:   Patient ID: [name], female    DOB: Apr 27, 1951, 66 y.o.   MRN: [id number] CC: Groin Pain (left side, 1 week)  HPI: [name] is a 66 y.o. female presenting for Groin Pain (left side, 1 week)  About a week ago started hurting Woke up with it No injury No falls Has been taking care of aunt for past 3 weeks since her aunt broke her knee Lifting her in and out of bed and on and off of bed pan Having pain in ant groin Not walking up and down stiars  Relevant past medical, surgical, family and social history reviewed. Allergies and medications reviewed and updated. History  Smoking Status  . Former Smoker  . Packs/day: 1.00  . Years: 30.00  . Types: Cigarettes  . Quit date: 11/13/2012  Smokeless Tobacco  . Never Used   ROS: Per HPI   Objective:    BP 122/62   Pulse 73   Temp 98 F (36.7 C) (Oral)   Ht 5\' 7"  (1.702 m)   Wt 199 lb 9.6 oz (90.5 kg)   BMI 31.26 kg/m   Wt Readings from Last 3 Encounters:  02/02/17 199 lb 9.6 oz (90.5 kg)  12/13/16 193 lb (87.5 kg)  08/29/16 194 lb (88 kg)    Gen: NAD, alert, cooperative with exam, NCAT EYES: EOMI, no conjunctival injection, or no icterus CV: NRRR, normal S1/S2, no murmur Resp: CTABL, no wheezes, normal WOB Abd: +BS, soft, NTND. no guarding or organomegaly Ext: No edema, warm Neuro: Alert and oriented MSK: no pain with straight leg int/ext rotation b/l, minimal pain L leg with int/ext rotation compared with R leg,nl ROM b/l Pain with hip flexion L side Pelvis stable Strength 5/5 b/l knee ext/flex Mild pain anterior hip with L leg adduction against resistance compared with R  Assessment & Plan:  [name] was seen today for groin pain.  Diagnoses and all orders for this visit:  Left groin pain Hip xray without arthritis Discussed treatment for tendonitis of hip flexor Rest, gentle stretching Tylenol, heat/ice Avoid NSAIDs with h/o CKD -     DG HIP UNILAT W OR W/O PELVIS 2-3 VIEWS  LEFT; Future  Tendinitis of left hip flexor As above  Encounter for immunization -     Flu Vaccine QUAD 36+ mos IM  Follow up plan: 2 mo for next chronic f/u appt Assunta Found, MD [place]

## 2017-02-08 ENCOUNTER — Other Ambulatory Visit: Payer: Self-pay | Admitting: Pediatrics

## 2017-02-08 ENCOUNTER — Other Ambulatory Visit: Payer: Self-pay | Admitting: Nurse Practitioner

## 2017-02-08 DIAGNOSIS — G43709 Chronic migraine without aura, not intractable, without status migrainosus: Secondary | ICD-10-CM

## 2017-02-08 DIAGNOSIS — IMO0002 Reserved for concepts with insufficient information to code with codable children: Secondary | ICD-10-CM

## 2017-03-07 ENCOUNTER — Ambulatory Visit: Payer: Medicare Other | Admitting: Pediatrics

## 2017-03-08 ENCOUNTER — Encounter: Payer: Self-pay | Admitting: Nurse Practitioner

## 2017-03-13 ENCOUNTER — Other Ambulatory Visit: Payer: Self-pay | Admitting: Nurse Practitioner

## 2017-03-13 DIAGNOSIS — I10 Essential (primary) hypertension: Secondary | ICD-10-CM

## 2017-03-20 ENCOUNTER — Other Ambulatory Visit: Payer: Self-pay | Admitting: Nurse Practitioner

## 2017-03-20 ENCOUNTER — Ambulatory Visit: Payer: Medicare Other | Admitting: Pediatrics

## 2017-03-21 ENCOUNTER — Encounter: Payer: Self-pay | Admitting: Nurse Practitioner

## 2017-03-21 ENCOUNTER — Telehealth: Payer: Self-pay | Admitting: Nurse Practitioner

## 2017-04-13 ENCOUNTER — Other Ambulatory Visit: Payer: Self-pay | Admitting: Nurse Practitioner

## 2017-04-13 DIAGNOSIS — G4700 Insomnia, unspecified: Secondary | ICD-10-CM

## 2017-04-19 ENCOUNTER — Ambulatory Visit (INDEPENDENT_AMBULATORY_CARE_PROVIDER_SITE_OTHER): Payer: Medicare Other | Admitting: Pediatrics

## 2017-04-19 VITALS — BP 136/72 | HR 58 | Temp 97.9°F | Ht 67.0 in | Wt 206.0 lb

## 2017-04-19 DIAGNOSIS — F419 Anxiety disorder, unspecified: Secondary | ICD-10-CM | POA: Diagnosis not present

## 2017-04-19 DIAGNOSIS — F329 Major depressive disorder, single episode, unspecified: Secondary | ICD-10-CM | POA: Diagnosis not present

## 2017-04-19 DIAGNOSIS — F32A Depression, unspecified: Secondary | ICD-10-CM

## 2017-04-19 MED ORDER — HYDROXYZINE HCL 10 MG PO TABS: 10.0000 mg | ORAL_TABLET | Freq: Three times a day (TID) | ORAL | 2 refills | Status: DC | PRN

## 2017-04-19 MED ORDER — BUSPIRONE HCL 5 MG PO TABS: 5.0000 mg | ORAL_TABLET | Freq: Three times a day (TID) | ORAL | 1 refills | Status: DC

## 2017-04-19 NOTE — Progress Notes (Signed)
  Subjective:   Patient ID: [name], female    DOB: 1951/08/11, 66 y.o.   MRN: [id number] CC: Follow-up anxiety HPI: [name] is a 66 y.o. female presenting for Follow-up  Anxiety and depression Says her psychiatrist retired in [place] and she has been assigned a new one but has to call to schedule appt isnt sure who new psychiatrist will be  Last appt some months ago Feeling anxious most of the time Taking care of sick family members, stress is high at home Mood has been down  Depression screen [facility] 2/9 04/19/2017 02/02/2017 12/13/2016 07/15/2016 06/13/2016  Decreased Interest 3 3 0 3 0  Down, Depressed, Hopeless 3 3 1 2  0  PHQ - 2 Score 6 6 1 5  0  Altered sleeping 3 0 - 3 -  Tired, decreased energy 3 3 - 3 -  Change in appetite 2 0 - 2 -  Feeling bad or failure about yourself  0 0 - 0 -  Trouble concentrating 0 0 - 0 -  Moving slowly or fidgety/restless 0 3 - 0 -  Suicidal thoughts 0 - - 0 -  PHQ-9 Score 14 12 - 13 -  Difficult doing work/chores Somewhat difficult Somewhat difficult - Somewhat difficult -  Some recent data might be hidden     Relevant past medical, surgical, family and social history reviewed. Allergies and medications reviewed and updated. History  Smoking Status  . Former Smoker  . Packs/day: 1.00  . Years: 30.00  . Types: Cigarettes  . Quit date: 11/13/2012  Smokeless Tobacco  . Never Used   ROS: Per HPI   Objective:    BP 136/72   Pulse (!) 58   Temp 97.9 F (36.6 C) (Oral)   Ht 5\' 7"  (1.702 m)   Wt 206 lb (93.4 kg)   BMI 32.26 kg/m   Wt Readings from Last 3 Encounters:  04/19/17 206 lb (93.4 kg)  02/02/17 199 lb 9.6 oz (90.5 kg)  12/13/16 193 lb (87.5 kg)    Gen: NAD, alert, cooperative with exam, NCAT EYES: EOMI, no conjunctival injection, or no icterus CV: NRRR, normal S1/S2, no murmur, distal pulses 2+ b/l Resp: CTABL, no wheezes, normal WOB Abd: +BS, soft, NTND. no guarding or organomegaly Ext: No edema, warm Neuro:  Alert and oriented, strength equal b/l UE and LE, coordination grossly normal MSK: normal muscle bulk Psych: normal affect, no thoughts of self harm  Assessment & Plan:  [name] was seen today for follow-up anxiety, depression.  Diagnoses and all orders for this visit:  Depression, unspecified depression type Mood down, does think citalopram helping Denies thoughts of self harm Open to [place] program, will refer  Anxiety Ongoing symptoms Start buspar Call psychiatrist to set up appt Referral made to [place] as above -     hydrOXYzine (ATARAX/VISTARIL) 10 MG tablet; Take 1 tablet (10 mg total) by mouth 3 (three) times daily as needed. -     busPIRone (BUSPAR) 5 MG tablet; Take 1 tablet (5 mg total) by mouth 3 (three) times daily.   Follow up plan: Return in about 4 weeks (around 05/17/2017). sooner if needed. Vanessa Found, MD [place]

## 2017-05-18 ENCOUNTER — Other Ambulatory Visit: Payer: Self-pay | Admitting: Pediatrics

## 2017-05-18 DIAGNOSIS — F419 Anxiety disorder, unspecified: Secondary | ICD-10-CM

## 2017-06-15 ENCOUNTER — Other Ambulatory Visit: Payer: Self-pay | Admitting: Pediatrics

## 2017-06-15 DIAGNOSIS — G4700 Insomnia, unspecified: Secondary | ICD-10-CM

## 2017-06-16 ENCOUNTER — Other Ambulatory Visit: Payer: Self-pay | Admitting: Nurse Practitioner

## 2017-06-16 DIAGNOSIS — G629 Polyneuropathy, unspecified: Secondary | ICD-10-CM

## 2017-06-16 NOTE — Telephone Encounter (Signed)
Last seen 04/19/17  MMM

## 2017-06-23 ENCOUNTER — Other Ambulatory Visit: Payer: Self-pay | Admitting: Nurse Practitioner

## 2017-06-23 DIAGNOSIS — G629 Polyneuropathy, unspecified: Secondary | ICD-10-CM

## 2017-06-27 ENCOUNTER — Ambulatory Visit (INDEPENDENT_AMBULATORY_CARE_PROVIDER_SITE_OTHER): Payer: Medicare Other | Admitting: Pediatrics

## 2017-06-27 ENCOUNTER — Encounter: Payer: Self-pay | Admitting: Pediatrics

## 2017-06-27 ENCOUNTER — Encounter: Payer: Self-pay | Admitting: Neurology

## 2017-06-27 VITALS — BP 121/71 | HR 73 | Temp 97.8°F | Ht 67.0 in | Wt 206.0 lb

## 2017-06-27 DIAGNOSIS — N189 Chronic kidney disease, unspecified: Secondary | ICD-10-CM | POA: Diagnosis not present

## 2017-06-27 DIAGNOSIS — D472 Monoclonal gammopathy: Secondary | ICD-10-CM

## 2017-06-27 DIAGNOSIS — F32A Depression, unspecified: Secondary | ICD-10-CM

## 2017-06-27 DIAGNOSIS — R51 Headache: Secondary | ICD-10-CM

## 2017-06-27 DIAGNOSIS — R519 Headache, unspecified: Secondary | ICD-10-CM

## 2017-06-27 DIAGNOSIS — H9190 Unspecified hearing loss, unspecified ear: Secondary | ICD-10-CM | POA: Diagnosis not present

## 2017-06-27 DIAGNOSIS — G43709 Chronic migraine without aura, not intractable, without status migrainosus: Secondary | ICD-10-CM | POA: Diagnosis not present

## 2017-06-27 DIAGNOSIS — F329 Major depressive disorder, single episode, unspecified: Secondary | ICD-10-CM

## 2017-06-27 DIAGNOSIS — G8929 Other chronic pain: Secondary | ICD-10-CM | POA: Diagnosis not present

## 2017-06-27 DIAGNOSIS — IMO0002 Reserved for concepts with insufficient information to code with codable children: Secondary | ICD-10-CM

## 2017-06-27 DIAGNOSIS — M5441 Lumbago with sciatica, right side: Secondary | ICD-10-CM

## 2017-06-27 LAB — CMP14+EGFR
ALT: 15 IU/L (ref 0–32)
AST: 15 IU/L (ref 0–40)
Albumin/Globulin Ratio: 1.4 (ref 1.2–2.2)
Albumin: 4.1 g/dL (ref 3.6–4.8)
Alkaline Phosphatase: 140 IU/L — ABNORMAL HIGH (ref 39–117)
BUN/Creatinine Ratio: 18 (ref 12–28)
BUN: 35 mg/dL — ABNORMAL HIGH (ref 8–27)
Bilirubin Total: 0.3 mg/dL (ref 0.0–1.2)
CO2: 18 mmol/L — ABNORMAL LOW (ref 20–29)
Calcium: 9.4 mg/dL (ref 8.7–10.3)
Chloride: 108 mmol/L — ABNORMAL HIGH (ref 96–106)
Creatinine, Ser: 1.95 mg/dL — ABNORMAL HIGH (ref 0.57–1.00)
GFR calc Af Amer: 30 mL/min/{1.73_m2} — ABNORMAL LOW (ref >59)
GFR calc non Af Amer: 26 mL/min/{1.73_m2} — ABNORMAL LOW (ref >59)
Globulin, Total: 3 g/dL (ref 1.5–4.5)
Glucose: 89 mg/dL (ref 65–99)
Potassium: 5.8 mmol/L — ABNORMAL HIGH (ref 3.5–5.2)
Sodium: 141 mmol/L (ref 134–144)
Total Protein: 7.1 g/dL (ref 6.0–8.5)

## 2017-06-27 MED ORDER — BUTALBITAL-APAP-CAFFEINE 50-325-40 MG PO TABS: ORAL_TABLET | ORAL | 0 refills | Status: DC

## 2017-06-27 MED ORDER — CYCLOBENZAPRINE HCL 5 MG PO TABS: 5.0000 mg | ORAL_TABLET | Freq: Three times a day (TID) | ORAL | 0 refills | Status: DC | PRN

## 2017-06-27 NOTE — Progress Notes (Signed)
Subjective:   Patient ID: [name], female    DOB: 1951-11-17, 66 y.o.   MRN: [id number] CC: Headache and Back Pain  HPI: [name] is a 66 y.o. female presenting for Headache and Back Pain  Has been more active than usual past couple of months, there have been a couple of deaths in the family, has been hard month  Has h/o back pain, had back surgery some years ago, no hardware Back pain has returned last couple of weeks, feels similar to prior episodes Muscle relaxer has helped in the past No weakness/numbness in legs  HA for past 2 days Hurts from back R side into the front Light makes HA worse No recent seizures Wants to see a neurologist closer to her, previously followed in [place]  Ongoing anxiety and depression Was referred to new psychiatrist after her previous one retired, she missed first appt and now needs new referral bc they wont see her  Wants her hearing checked, has had a harder time hearing others over past year or so  Was following with nephrology for CKD, says she stopped going  Last follow up with oncology for MGUS was in 07/2016, missed follow up appt  Relevant past medical, surgical, family and social history reviewed. Allergies and medications reviewed and updated. History  Smoking Status  . Former Smoker  . Packs/day: 1.00  . Years: 30.00  . Types: Cigarettes  . Quit date: 11/13/2012  Smokeless Tobacco  . Never Used   ROS: Per HPI   Objective:    BP 121/71   Pulse 73   Temp 97.8 F (36.6 C) (Oral)   Ht '5\' 7"'  (1.702 m)   Wt 206 lb (93.4 kg)   BMI 32.26 kg/m   Wt Readings from Last 3 Encounters:  06/27/17 206 lb (93.4 kg)  04/19/17 206 lb (93.4 kg)  02/02/17 199 lb 9.6 oz (90.5 kg)    Gen: NAD, alert, cooperative with exam, NCAT EYES: EOMI, PERRL, no conjunctival injection, or no icterus ENT:  TMs pearly gray b/l, OP without erythema LYMPH: no cervical LAD CV: NRRR, normal S1/S2, no murmur, distal pulses 2+ b/l Resp:  CTABL, no wheezes, normal WOB Abd: +BS, soft, NTND.  Ext: No edema, warm Neuro: Alert and oriented, strength equal b/l UE and LE, sensation intact, coordination grossly normal MSK: no point tenderness over spine  Assessment & Plan:  Harika was seen today for headache and back pain.  Diagnoses and all orders for this visit:  Depression, unspecified depression type Mood is down with recent family losses, no thoughts of self harm, cont citalopram, ref to spcyh -     Ambulatory referral to Psychiatry  Nonintractable headache, unspecified chronicity pattern, unspecified headache type H/o chronic migraine, ongoing symptoms fioricet has helped in past, overused the medication, however, will give #5 tablets Refer to neurology Last imaging CT scan in 07/2016 Discussed return precautions -     Ambulatory referral to Neurology -     butalbital-acetaminophen-caffeine (FIORICET, ESGIC) 50-325-40 MG tablet; TAKE 1 TO 2 TABLETS BY MOUTH EVERY 6 HOURS AS NEEDED FOR HEADACHES  Chronic right-sided low back pain with right-sided sciatica Trial of below, will get xrays -     cyclobenzaprine (FLEXERIL) 5 MG tablet; Take 1 tablet (5 mg total) by mouth 3 (three) times daily as needed. for muscle spams -     DG Lumbar Spine 2-3 Views; Future  Chronic migraine  Chronic kidney disease, unspecified CKD stage -  CMP14+EGFR  MGUS (monoclonal gammopathy of unknown significance) Overdue for f/u with oncology  Hearing problem, unspecified laterality -     Ambulatory referral to Audiology   Follow up plan: Return in about 4 weeks (around 07/25/2017). Sooner if needed Assunta Found, MD [place]

## 2017-06-27 NOTE — Patient Instructions (Addendum)
Call [facility] Hematology/Oncology to set up follow up appt: [phone]   We are getting blood work today and back xray  We are sending in referrals for you to re-establish care with new neurologist and psychiatrist You should hear about these appointments in the next week

## 2017-06-28 ENCOUNTER — Telehealth: Payer: Self-pay | Admitting: Pediatrics

## 2017-06-28 ENCOUNTER — Other Ambulatory Visit: Payer: Self-pay | Admitting: Pediatrics

## 2017-06-28 ENCOUNTER — Other Ambulatory Visit: Payer: Self-pay | Admitting: *Deleted

## 2017-06-28 DIAGNOSIS — N189 Chronic kidney disease, unspecified: Secondary | ICD-10-CM

## 2017-06-28 DIAGNOSIS — E875 Hyperkalemia: Secondary | ICD-10-CM

## 2017-06-28 MED ORDER — SODIUM POLYSTYRENE SULFONATE 15 GM/60ML PO SUSP: 15.0000 g | Freq: Once | ORAL | 0 refills | Status: AC

## 2017-06-28 NOTE — Telephone Encounter (Signed)
Pharmacy needs the verbal okay from doctor- Pharmacy is out of keyexalate suspension - can they change to the powder instead? Please advise

## 2017-06-28 NOTE — Telephone Encounter (Signed)
Pharmacy aware

## 2017-06-28 NOTE — Telephone Encounter (Signed)
Yes that's fine, thanks

## 2017-06-30 ENCOUNTER — Other Ambulatory Visit: Payer: Medicare Other

## 2017-06-30 DIAGNOSIS — E875 Hyperkalemia: Secondary | ICD-10-CM

## 2017-07-01 ENCOUNTER — Other Ambulatory Visit: Payer: Self-pay | Admitting: Pediatrics

## 2017-07-01 DIAGNOSIS — E875 Hyperkalemia: Secondary | ICD-10-CM

## 2017-07-01 LAB — BMP8+EGFR
BUN/Creatinine Ratio: 14 (ref 12–28)
BUN: 33 mg/dL — ABNORMAL HIGH (ref 8–27)
CO2: 18 mmol/L — ABNORMAL LOW (ref 20–29)
Calcium: 9 mg/dL (ref 8.7–10.3)
Chloride: 108 mmol/L — ABNORMAL HIGH (ref 96–106)
Creatinine, Ser: 2.39 mg/dL — ABNORMAL HIGH (ref 0.57–1.00)
GFR calc Af Amer: 24 mL/min/{1.73_m2} — ABNORMAL LOW (ref >59)
GFR calc non Af Amer: 21 mL/min/{1.73_m2} — ABNORMAL LOW (ref >59)
Glucose: 91 mg/dL (ref 65–99)
Potassium: 5.9 mmol/L (ref 3.5–5.2)
Sodium: 140 mmol/L (ref 134–144)

## 2017-07-01 MED ORDER — SODIUM POLYSTYRENE SULFONATE 15 GM/60ML PO SUSP: 15.0000 g | Freq: Four times a day (QID) | ORAL | 0 refills | Status: AC

## 2017-07-03 ENCOUNTER — Ambulatory Visit: Payer: Medicare Other | Admitting: Pediatrics

## 2017-07-06 ENCOUNTER — Encounter: Payer: Self-pay | Admitting: Pediatrics

## 2017-07-06 ENCOUNTER — Ambulatory Visit (INDEPENDENT_AMBULATORY_CARE_PROVIDER_SITE_OTHER): Payer: Medicare Other | Admitting: Pediatrics

## 2017-07-06 VITALS — BP 135/76 | HR 78 | Temp 98.0°F | Ht 67.0 in | Wt 209.2 lb

## 2017-07-06 DIAGNOSIS — G43709 Chronic migraine without aura, not intractable, without status migrainosus: Secondary | ICD-10-CM

## 2017-07-06 DIAGNOSIS — IMO0002 Reserved for concepts with insufficient information to code with codable children: Secondary | ICD-10-CM

## 2017-07-06 DIAGNOSIS — E875 Hyperkalemia: Secondary | ICD-10-CM | POA: Diagnosis not present

## 2017-07-06 MED ORDER — TOPIRAMATE 15 MG PO CPSP: 30.0000 mg | ORAL_CAPSULE | Freq: Two times a day (BID) | ORAL | 2 refills | Status: DC

## 2017-07-06 NOTE — Progress Notes (Signed)
  Subjective:   Patient ID: [name], female    DOB: 27-Oct-1951, 66 y.o.   MRN: [id number] CC: elevated potassium  HPI: [name] is a 66 y.o. female presenting for elevated potassium  Continues to have headaches Has appt in a couple months to see neurologist botox has worked in the past but has been cost prohibitive recently  Says she took two doses of kayexelate since last visit with good return of stool afterwards No heart palpitations Went over medicine list, denied any other meds taken daily  has upcoming appt with nephrology  Relevant past medical, surgical, family and social history reviewed. Allergies and medications reviewed and updated. History  Smoking Status  . Former Smoker  . Packs/day: 1.00  . Years: 30.00  . Types: Cigarettes  . Quit date: 11/13/2012  Smokeless Tobacco  . Never Used   ROS: Per HPI   Objective:    BP 135/76   Pulse 78   Temp 98 F (36.7 C) (Oral)   Ht 5\' 7"  (1.702 m)   Wt 209 lb 3.2 oz (94.9 kg)   BMI 32.77 kg/m   Wt Readings from Last 3 Encounters:  07/06/17 209 lb 3.2 oz (94.9 kg)  06/27/17 206 lb (93.4 kg)  04/19/17 206 lb (93.4 kg)    Gen: NAD, alert, cooperative with exam, NCAT EYES:  no conjunctival injection, or no icterus CV: NRRR, normal S1/S2, no murmur, distal pulses 2+ b/l Resp: CTABL, no wheezes, normal WOB Abd: +BS, soft, NTND. no guarding or organomegaly Ext: No edema, warm Neuro: Alert and oriented MSK: normal muscle bulk  EKG: NSR, poor R wave progression, slightly peaked T waves, similar to last EKG 08/29/2016. K not known at that time. Assessment & Plan:  [name] was seen today for elevated potassium.  Diagnoses and all orders for this visit:  Serum potassium elevated EKG with slightly peaked T qwaves, similar to last in system -     EKG 12-Lead  Hyperkalemia Repeat BMP -     Basic Metabolic Panel  Chronic migraine Increase to 30mg  BID Follow up with neurology -     topiramate  (TOPAMAX) 15 MG capsule; Take 2 capsules (30 mg total) by mouth 2 (two) times daily.   Follow up plan: Return if symptoms worsen or fail to improve. Assunta Found, MD [facility]  ADDENDUM: Elevated K to 6.4 Cr slightly improved Take 3 doses of 15g kayexelate noon, evening, next morning. miralax if needed for stooling rtc Monday for K redraw

## 2017-07-07 ENCOUNTER — Telehealth: Payer: Self-pay | Admitting: *Deleted

## 2017-07-07 DIAGNOSIS — E875 Hyperkalemia: Secondary | ICD-10-CM

## 2017-07-07 LAB — BASIC METABOLIC PANEL
BUN/Creatinine Ratio: 18 (ref 12–28)
BUN: 30 mg/dL — ABNORMAL HIGH (ref 8–27)
CO2: 18 mmol/L — ABNORMAL LOW (ref 20–29)
Calcium: 9.6 mg/dL (ref 8.7–10.3)
Chloride: 107 mmol/L — ABNORMAL HIGH (ref 96–106)
Creatinine, Ser: 1.69 mg/dL — ABNORMAL HIGH (ref 0.57–1.00)
GFR calc Af Amer: 36 mL/min/{1.73_m2} — ABNORMAL LOW (ref >59)
GFR calc non Af Amer: 31 mL/min/{1.73_m2} — ABNORMAL LOW (ref >59)
Glucose: 79 mg/dL (ref 65–99)
Potassium: 6.4 mmol/L (ref 3.5–5.2)
Sodium: 139 mmol/L (ref 134–144)

## 2017-07-07 MED ORDER — KAYEXALATE PO POWD: ORAL | 0 refills | Status: DC

## 2017-07-07 NOTE — Telephone Encounter (Signed)
Patient aware that potassium is elevated and will need to take kayexelate today, tonight and tomorrow am and will need to return to office on Monday 07/10/17 to have potassium rechecked.  Per Dr. Evette Doffing.

## 2017-07-10 ENCOUNTER — Other Ambulatory Visit: Payer: Self-pay | Admitting: *Deleted

## 2017-07-10 DIAGNOSIS — E875 Hyperkalemia: Secondary | ICD-10-CM

## 2017-07-11 ENCOUNTER — Other Ambulatory Visit: Payer: Medicare Other

## 2017-07-11 DIAGNOSIS — E875 Hyperkalemia: Secondary | ICD-10-CM

## 2017-07-12 ENCOUNTER — Telehealth: Payer: Self-pay | Admitting: *Deleted

## 2017-07-12 ENCOUNTER — Other Ambulatory Visit: Payer: Self-pay | Admitting: *Deleted

## 2017-07-12 DIAGNOSIS — E875 Hyperkalemia: Secondary | ICD-10-CM

## 2017-07-12 LAB — BMP8+EGFR
BUN/Creatinine Ratio: 19 (ref 12–28)
BUN: 34 mg/dL — ABNORMAL HIGH (ref 8–27)
CO2: 17 mmol/L — ABNORMAL LOW (ref 20–29)
Calcium: 9.9 mg/dL (ref 8.7–10.3)
Chloride: 107 mmol/L — ABNORMAL HIGH (ref 96–106)
Creatinine, Ser: 1.8 mg/dL — ABNORMAL HIGH (ref 0.57–1.00)
GFR calc Af Amer: 34 mL/min/{1.73_m2} — ABNORMAL LOW (ref >59)
GFR calc non Af Amer: 29 mL/min/{1.73_m2} — ABNORMAL LOW (ref >59)
Glucose: 80 mg/dL (ref 65–99)
Potassium: 6.3 mmol/L (ref 3.5–5.2)
Sodium: 140 mmol/L (ref 134–144)

## 2017-07-12 MED ORDER — KAYEXALATE PO POWD: ORAL | 0 refills | Status: DC

## 2017-07-12 NOTE — Telephone Encounter (Signed)
Patient aware that potassium is elevated and will need to take kayexalate twice daily with breakfast and lunch.  Needs to have BM and hydrate self.  Will need to return on Friday 07/14/17 to have potassium redrawn.  Per Dr. Evette Doffing

## 2017-07-13 ENCOUNTER — Other Ambulatory Visit: Payer: Self-pay | Admitting: Nurse Practitioner

## 2017-07-13 DIAGNOSIS — G4700 Insomnia, unspecified: Secondary | ICD-10-CM

## 2017-07-14 ENCOUNTER — Other Ambulatory Visit: Payer: Medicare Other

## 2017-07-14 DIAGNOSIS — E875 Hyperkalemia: Secondary | ICD-10-CM

## 2017-07-15 LAB — BMP8+EGFR
BUN/Creatinine Ratio: 16 (ref 12–28)
BUN: 31 mg/dL — ABNORMAL HIGH (ref 8–27)
CO2: 19 mmol/L — ABNORMAL LOW (ref 20–29)
Calcium: 9.1 mg/dL (ref 8.7–10.3)
Chloride: 106 mmol/L (ref 96–106)
Creatinine, Ser: 1.89 mg/dL — ABNORMAL HIGH (ref 0.57–1.00)
GFR calc Af Amer: 32 mL/min/{1.73_m2} — ABNORMAL LOW (ref >59)
GFR calc non Af Amer: 27 mL/min/{1.73_m2} — ABNORMAL LOW (ref >59)
Glucose: 101 mg/dL — ABNORMAL HIGH (ref 65–99)
Potassium: 5.1 mmol/L (ref 3.5–5.2)
Sodium: 142 mmol/L (ref 134–144)

## 2017-07-17 ENCOUNTER — Other Ambulatory Visit: Payer: Self-pay | Admitting: Nurse Practitioner

## 2017-07-17 DIAGNOSIS — G629 Polyneuropathy, unspecified: Secondary | ICD-10-CM

## 2017-07-25 ENCOUNTER — Ambulatory Visit (INDEPENDENT_AMBULATORY_CARE_PROVIDER_SITE_OTHER): Payer: Medicare Other | Admitting: Pediatrics

## 2017-07-25 ENCOUNTER — Encounter: Payer: Self-pay | Admitting: Pediatrics

## 2017-07-25 VITALS — BP 135/73 | HR 71 | Temp 97.2°F | Ht 67.0 in | Wt 209.0 lb

## 2017-07-25 DIAGNOSIS — R569 Unspecified convulsions: Secondary | ICD-10-CM | POA: Diagnosis not present

## 2017-07-25 DIAGNOSIS — F32A Depression, unspecified: Secondary | ICD-10-CM

## 2017-07-25 DIAGNOSIS — F329 Major depressive disorder, single episode, unspecified: Secondary | ICD-10-CM | POA: Diagnosis not present

## 2017-07-25 DIAGNOSIS — G8929 Other chronic pain: Secondary | ICD-10-CM

## 2017-07-25 DIAGNOSIS — Z1211 Encounter for screening for malignant neoplasm of colon: Secondary | ICD-10-CM | POA: Diagnosis not present

## 2017-07-25 DIAGNOSIS — G4700 Insomnia, unspecified: Secondary | ICD-10-CM

## 2017-07-25 DIAGNOSIS — M5441 Lumbago with sciatica, right side: Secondary | ICD-10-CM | POA: Diagnosis not present

## 2017-07-25 MED ORDER — VENLAFAXINE HCL 25 MG PO TABS: 25.0000 mg | ORAL_TABLET | Freq: Two times a day (BID) | ORAL | 1 refills | Status: DC

## 2017-07-25 MED ORDER — CYCLOBENZAPRINE HCL 5 MG PO TABS: 5.0000 mg | ORAL_TABLET | Freq: Three times a day (TID) | ORAL | 1 refills | Status: DC | PRN

## 2017-07-25 MED ORDER — ZOLPIDEM TARTRATE 5 MG PO TABS: 5.0000 mg | ORAL_TABLET | Freq: Every evening | ORAL | 1 refills | Status: DC | PRN

## 2017-07-25 MED ORDER — LEVETIRACETAM 500 MG PO TABS: 500.0000 mg | ORAL_TABLET | Freq: Two times a day (BID) | ORAL | 5 refills | Status: DC

## 2017-07-25 NOTE — Patient Instructions (Addendum)
Take half tab (20mg ) of citalopram/celexa for 1 week. Start venlafaxine 25mg  twice a day

## 2017-07-25 NOTE — Progress Notes (Signed)
  Subjective:   Patient ID: [name], female    DOB: 02/17/1951, 66 y.o.   MRN: [id number] CC: Follow-up (4 week) med problems  HPI: [name] is a 66 y.o. female presenting for Follow-up (4 week)  Headaches: continues to have regular Had some nausea a few days ago with headaches Taking topamax 30mg  BID  Insomnia: continues to have a hard time sleeping Had been getting ambien through psychiatry, now between providers Ongoing depression symptoms Pt says mood much better than a couple of years ago At that time had been admitted to the hospital for altered mental status She says she did not take anything to hurt herself but was not taking her medications as prescribed which likely contributed to AMS Has been stable on ambien since then No thoughts of self harm  Back pain continues to bother her off and on Flexeril helps  Relevant past medical, surgical, family and social history reviewed. Allergies and medications reviewed and updated. History  Smoking Status  . Former Smoker  . Packs/day: 1.00  . Years: 30.00  . Types: Cigarettes  . Quit date: 11/13/2012  Smokeless Tobacco  . Never Used   ROS: Per HPI   Objective:    BP 135/73   Pulse 71   Temp (!) 97.2 F (36.2 C) (Oral)   Ht 5\' 7"  (1.702 m)   Wt 209 lb (94.8 kg)   BMI 32.73 kg/m   Wt Readings from Last 3 Encounters:  07/25/17 209 lb (94.8 kg)  07/06/17 209 lb 3.2 oz (94.9 kg)  06/27/17 206 lb (93.4 kg)    Gen: NAD, alert, cooperative with exam, NCAT EYES: EOMI, no conjunctival injection, or no icterus CV: NRRR, normal S1/S2, no murmur, distal pulses 2+ b/l Resp: CTABL, no wheezes, normal WOB Abd: +BS, soft, NTND.  Ext: No edema, warm Neuro: Alert and oriented Psych: no thoughts of self harm, well groomed, good eye contact  Assessment & Plan:  Dosia was seen today for follow-up med problems.  Diagnoses and all orders for this visit:  Depression, unspecified depression type Ongoing  symptoms, wean off citalopram, start below -     venlafaxine (EFFEXOR) 25 MG tablet; Take 1 tablet (25 mg total) by mouth 2 (two) times daily.  Seizures (HCC) Stable, no recent seizures, cont below -     levETIRAcetam (KEPPRA) 500 MG tablet; Take 1 tablet (500 mg total) by mouth 2 (two) times daily.  Chronic right-sided low back pain with right-sided sciatica Cont below as needed, dont take with othereds that cause sleepiness -     cyclobenzaprine (FLEXERIL) 5 MG tablet; Take 1 tablet (5 mg total) by mouth 3 (three) times daily as needed. for muscle spams  Colon cancer screening -     Fecal occult blood, imunochemical; Future  Insomnia, unspecified type Rx for #10 ambien for a month given, dont take every night Also taking trazodone 50mg  qhs -     zolpidem (AMBIEN) 5 MG tablet; Take 1 tablet (5 mg total) by mouth at bedtime as needed for sleep.  Follow up plan: Return in about 8 weeks (around 09/19/2017). Assunta Found, MD [place]

## 2017-08-11 ENCOUNTER — Other Ambulatory Visit: Payer: Self-pay | Admitting: Pediatrics

## 2017-08-11 ENCOUNTER — Other Ambulatory Visit: Payer: Self-pay | Admitting: Nurse Practitioner

## 2017-08-11 DIAGNOSIS — G629 Polyneuropathy, unspecified: Secondary | ICD-10-CM

## 2017-08-11 DIAGNOSIS — M5441 Lumbago with sciatica, right side: Secondary | ICD-10-CM

## 2017-08-11 DIAGNOSIS — F419 Anxiety disorder, unspecified: Secondary | ICD-10-CM

## 2017-08-11 DIAGNOSIS — G4700 Insomnia, unspecified: Secondary | ICD-10-CM

## 2017-08-11 DIAGNOSIS — G8929 Other chronic pain: Secondary | ICD-10-CM

## 2017-08-28 ENCOUNTER — Encounter (HOSPITAL_COMMUNITY): Payer: Medicare Other

## 2017-08-28 ENCOUNTER — Ambulatory Visit (HOSPITAL_COMMUNITY)
Admission: RE | Admit: 2017-08-28 | Discharge: 2017-08-28 | Disposition: A | Discharge status: Home / Self Care | Payer: Medicare Other | Source: Ambulatory Visit | Attending: Oncology | Admitting: Oncology

## 2017-08-28 ENCOUNTER — Encounter (HOSPITAL_COMMUNITY): Payer: Medicare Other | Attending: Oncology | Admitting: Oncology

## 2017-08-28 ENCOUNTER — Encounter (HOSPITAL_COMMUNITY): Payer: Self-pay | Admitting: Oncology

## 2017-08-28 VITALS — BP 102/60 | HR 91 | Temp 97.5°F | Resp 18 | Wt 203.1 lb

## 2017-08-28 DIAGNOSIS — Z9049 Acquired absence of other specified parts of digestive tract: Secondary | ICD-10-CM | POA: Diagnosis not present

## 2017-08-28 DIAGNOSIS — R569 Unspecified convulsions: Secondary | ICD-10-CM | POA: Insufficient documentation

## 2017-08-28 DIAGNOSIS — D472 Monoclonal gammopathy: Secondary | ICD-10-CM | POA: Insufficient documentation

## 2017-08-28 DIAGNOSIS — I130 Hypertensive heart and chronic kidney disease with heart failure and stage 1 through stage 4 chronic kidney disease, or unspecified chronic kidney disease: Secondary | ICD-10-CM | POA: Diagnosis not present

## 2017-08-28 DIAGNOSIS — K219 Gastro-esophageal reflux disease without esophagitis: Secondary | ICD-10-CM | POA: Insufficient documentation

## 2017-08-28 DIAGNOSIS — M5031 Other cervical disc degeneration,  high cervical region: Secondary | ICD-10-CM | POA: Insufficient documentation

## 2017-08-28 DIAGNOSIS — Z87891 Personal history of nicotine dependence: Secondary | ICD-10-CM | POA: Insufficient documentation

## 2017-08-28 DIAGNOSIS — I5032 Chronic diastolic (congestive) heart failure: Secondary | ICD-10-CM | POA: Insufficient documentation

## 2017-08-28 DIAGNOSIS — E785 Hyperlipidemia, unspecified: Secondary | ICD-10-CM | POA: Insufficient documentation

## 2017-08-28 DIAGNOSIS — I10 Essential (primary) hypertension: Secondary | ICD-10-CM

## 2017-08-28 DIAGNOSIS — G4700 Insomnia, unspecified: Secondary | ICD-10-CM | POA: Insufficient documentation

## 2017-08-28 DIAGNOSIS — J986 Disorders of diaphragm: Secondary | ICD-10-CM | POA: Diagnosis not present

## 2017-08-28 DIAGNOSIS — F329 Major depressive disorder, single episode, unspecified: Secondary | ICD-10-CM | POA: Diagnosis not present

## 2017-08-28 DIAGNOSIS — N183 Chronic kidney disease, stage 3 (moderate): Secondary | ICD-10-CM | POA: Diagnosis not present

## 2017-08-28 DIAGNOSIS — J449 Chronic obstructive pulmonary disease, unspecified: Secondary | ICD-10-CM | POA: Insufficient documentation

## 2017-08-28 DIAGNOSIS — D72822 Plasmacytosis: Secondary | ICD-10-CM | POA: Diagnosis not present

## 2017-08-28 LAB — COMPREHENSIVE METABOLIC PANEL
ALT: 15 U/L (ref 14–54)
AST: 21 U/L (ref 15–41)
Albumin: 4.2 g/dL (ref 3.5–5.0)
Alkaline Phosphatase: 148 U/L — ABNORMAL HIGH (ref 38–126)
Anion gap: 13 (ref 5–15)
BUN: 39 mg/dL — ABNORMAL HIGH (ref 6–20)
CO2: 19 mmol/L — ABNORMAL LOW (ref 22–32)
Calcium: 9.2 mg/dL (ref 8.9–10.3)
Chloride: 102 mmol/L (ref 101–111)
Creatinine, Ser: 2.16 mg/dL — ABNORMAL HIGH (ref 0.44–1.00)
GFR calc Af Amer: 26 mL/min — ABNORMAL LOW (ref >60)
GFR calc non Af Amer: 23 mL/min — ABNORMAL LOW (ref >60)
Glucose, Bld: 97 mg/dL (ref 65–99)
Potassium: 5.1 mmol/L (ref 3.5–5.1)
Sodium: 134 mmol/L — ABNORMAL LOW (ref 135–145)
Total Bilirubin: 0.6 mg/dL (ref 0.3–1.2)
Total Protein: 8 g/dL (ref 6.5–8.1)

## 2017-08-28 LAB — CBC WITH DIFFERENTIAL/PLATELET
Basophils Absolute: 0.1 10*3/uL (ref 0.0–0.1)
Basophils Relative: 2 %
Eosinophils Absolute: 0.1 10*3/uL (ref 0.0–0.7)
Eosinophils Relative: 3 %
HCT: 43.9 % (ref 36.0–46.0)
Hemoglobin: 13.9 g/dL (ref 12.0–15.0)
Lymphocytes Relative: 26 %
Lymphs Abs: 1.3 10*3/uL (ref 0.7–4.0)
MCH: 30.2 pg (ref 26.0–34.0)
MCHC: 31.7 g/dL (ref 30.0–36.0)
MCV: 95.4 fL (ref 78.0–100.0)
Monocytes Absolute: 0.3 10*3/uL (ref 0.1–1.0)
Monocytes Relative: 6 %
Neutro Abs: 3.4 10*3/uL (ref 1.7–7.7)
Neutrophils Relative %: 64 %
RBC: 4.6 MIL/uL (ref 3.87–5.11)
RDW: 12.6 % (ref 11.5–15.5)
WBC: 5.2 10*3/uL (ref 4.0–10.5)

## 2017-08-28 NOTE — Progress Notes (Signed)
Vanessa Maize, MD [place] [address]    DIAGNOSIS: IgG Kappa MGUS (monoclonal gammopathy of unknown significance) BMBX 02/2014 BONE MARROW REPORT FINAL DIAGNOSIS Diagnosis Bone Marrow, Aspirate,Biopsy, and Clot, right iliac - NORMOCELLULAR MARROW WITH MILD POLYTYPIC PLASMACYTOSIS (5%). - SEE COMMENT. PERIPHERAL BLOOD: - UNREMARKABLE. Diagnosis Note The bone marrow is overall normocellular. There is a mild plasmacytosis (approximately 5%) which is polytypic by light chain immunohistochemistry. These findings are non-specific and are not diagnostic for a plasma cell neoplasm. Vicente Males MD Pathologist, Electronic Signature (Case signed 03/03/2014) GROSS AND MICROSCOPIC INFORMATIO  CKD  CURRENT THERAPY: Observation  INTERVAL HISTORY: [name] 66 y.o. female returns for follow up of MGUS. She has been diagnosed with encephalopathy and acute on chronic renal failure.   Patient presents today for follow-up of MGUS. Patient has been lost to follow-up and was last seen in September 2017. She was told by her nephrologist at [facility] to come back to see [place]. She states that overall she's been doing well except for fatigue. She denies any bone pain, chest pain shortness breath, abdominal pain, focal weakness, changes in her bowels. She states she has chronic low back pain which is unchanged.  MEDICAL HISTORY: Past Medical History:  Diagnosis Date  . Chronic headaches   . CKD (chronic kidney disease) stage 3, GFR 30-59 ml/min (HCC)   . Depression   . Encephalopathy   . Essential hypertension   . Hyperlipidemia   . 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 Osteoporosis   . Restrictive lung disease   . Right leg weakness   . Seizures [facility])    first sz age 41, last seizure Aug 2017  . URI (upper respiratory infection)     has Essential hypertension; DOE (dyspnea on exertion); CHEST PAIN-UNSPECIFIED; Insomnia;  Depression; Seizures ([place]); MGUS (monoclonal gammopathy of unknown significance); Diaphragm paralysis; Gastroesophageal reflux disease without esophagitis; Neuropathy; Nausea vomiting and diarrhea; Chronic kidney disease; Chronic diastolic congestive heart failure (HCC); COPD (chronic obstructive pulmonary disease) ([place]); Osteoarthritis of right knee; Chronic migraine; Memory disturbance; BMI 31.0-31.9,adult; and Pain of right hip joint on her problem list.     is allergic to rofecoxib; asa [aspirin]; penicillins; sulfonamide derivatives; and tylenol with codeine #3 [acetaminophen-codeine].  [name] had no medications administered during this visit.   SURGICAL HISTORY: Past Surgical History:  Procedure Laterality Date  . ABDOMINAL HYSTERECTOMY    . APPENDECTOMY    . BACK SURGERY  2002  . BREAST REDUCTION SURGERY    . CHOLECYSTECTOMY    . LUNG LOBECTOMY Right    Cyst    SOCIAL HISTORY: Social History   Social History  . Marital status: Widowed    Spouse name: N/A  . Number of children: 3  . Years of education: 79   Occupational History  . Unemployed    Social History Main Topics  . Smoking status: Former Smoker    Packs/day: 1.00    Years: 30.00    Types: Cigarettes    Quit date: 11/13/2012  . Smokeless tobacco: Never Used  . Alcohol use No  . Drug use: No  . Sexual activity: Not on file   Other Topics Concern  . Not on file   Social History Narrative   Lives with son, aunt   Caffeine - coffee 1 cup daily    FAMILY HISTORY: Family History  Problem Relation Age of Onset  . Hypertension Mother   . Heart  disease Mother   . Diabetes Mother   . Kidney disease Mother   . Arthritis Mother   . Hypertension Father   . Hypertension Paternal Aunt   . Kidney disease Paternal Aunt   . Hypertension Son     Review of Systems   14 point review of systems was performed and is negative except as detailed under history of present illness and above   PHYSICAL  EXAMINATION  ECOG PERFORMANCE STATUS: 1 - Symptomatic but completely ambulatory  Vitals:   08/28/17 1331  BP: 102/60  Pulse: 91  Resp: 18  Temp: (!) 97.5 F (36.4 C)  SpO2: 100%     Physical Exam  Constitutional: She is oriented to person, place, and time and well-developed, well-nourished, and in no distress.  HENT:  Head: Normocephalic and atraumatic.  Nose: Nose normal.  Mouth/Throat: Oropharynx is clear and moist. No oropharyngeal exudate.  Eyes: Conjunctivae and EOM are normal. Pupils are equal, round, and reactive to light. Right eye exhibits no discharge. Left eye exhibits no discharge. No scleral icterus.  Neck: Normal range of motion. Neck supple. No tracheal deviation present. No thyromegaly present.  Cardiovascular: Normal rate, regular rhythm and normal heart sounds.  Exam reveals no gallop and no friction rub.   No murmur heard. Occasional ectopy. Heart monitor noted.  Pulmonary/Chest: Effort normal and breath sounds normal. She has no wheezes. She has no rales.  Abdominal: Soft. Bowel sounds are normal. She exhibits no distension and no mass. There is no tenderness. There is no rebound and no guarding.  Musculoskeletal: Normal range of motion. She exhibits no edema.  Lymphadenopathy:    She has no cervical adenopathy.  Neurological: She is alert and oriented to person, place, and time. She has normal reflexes. No cranial nerve deficit. Gait normal. Coordination normal.  Skin: Skin is warm and dry. No rash noted.  Psychiatric: Mood, memory, affect and judgment normal.  Nursing note and vitals reviewed.   LABORATORY DATA: I have reviewed the data as listed CBC    Component Value Date/Time   WBC 4.3 07/29/2016 1103   RBC 4.03 07/29/2016 1103   HGB 12.4 07/29/2016 1103   HCT 39.1 07/29/2016 1103   HCT 40.9 05/29/2015 0801   PLT 373 07/29/2016 1103   MCV 97.0 07/29/2016 1103   MCV 95.8 04/10/2015 1244   MCH 30.8 07/29/2016 1103   MCHC 31.7 07/29/2016 1103     RDW 12.7 07/29/2016 1103   LYMPHSABS 1.2 07/29/2016 1103   MONOABS 0.3 07/29/2016 1103   EOSABS 0.2 07/29/2016 1103   BASOSABS 0.1 07/29/2016 1103   CMP Latest Ref Rng & Units 07/14/2017 07/11/2017 07/06/2017  Glucose 65 - 99 mg/dL 101(H) 80 79  BUN 8 - 27 mg/dL 31(H) 34(H) 30(H)  Creatinine 0.57 - 1.00 mg/dL 1.89(H) 1.80(H) 1.69(H)  Sodium 134 - 144 mmol/L 142 140 139  Potassium 3.5 - 5.2 mmol/L 5.1 6.3(>) 6.4(>)  Chloride 96 - 106 mmol/L 106 107(H) 107(H)  CO2 20 - 29 mmol/L 19(L) 17(L) 18(L)  Calcium 8.7 - 10.3 mg/dL 9.1 9.9 9.6  Total Protein 6.0 - 8.5 g/dL - - -  Total Bilirubin 0.0 - 1.2 mg/dL - - -  Alkaline Phos 39 - 117 IU/L - - -  AST 0 - 40 IU/L - - -  ALT 0 - 32 IU/L - - -            PATHOLOGY: Diagnosis Bone Marrow, Aspirate,Biopsy, and Clot, right iliac - NORMOCELLULAR MARROW WITH  MILD POLYTYPIC PLASMACYTOSIS (5%). - SEE COMMENT. PERIPHERAL BLOOD: - UNREMARKABLE. Diagnosis Note The bone marrow is overall normocellular. There is a mild plasmacytosis (approximately 5%) which is polytypic by light chain immunohistochemistry. These findings are non-specific and are not diagnostic for a plasma cell neoplasm. Vicente Males MD   RADIOLOGY: I have reviewed the above documentation for accuracy and completeness, and I agree with the above. CLINICAL DATA:  Monoclonal gammopathy of unknown significance. Right-sided pain.  EXAM: METASTATIC BONE SURVEY  COMPARISON:  05/21/2015  FINDINGS: Elevated right hemidiaphragm with atelectasis of the right lung base.  Cervical spondylosis and degenerative disc disease. Lumbar spondylosis and degenerative disc disease. Numerous calcified granulomas in the spleen.  No lytic bony lesions characteristic of myeloma identified.  IMPRESSION: 1. No lytic bony lesions characteristic myeloma are identified. 2. Chronic right hemidiaphragmatic elevation with atelectasis at the right lung base. 3. Cervical and lumbar  spondylosis and degenerative disc disease. 4. 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   Electronically Signed   By: Van Clines M.D.   On: 12/11/2015 12:42   ASSESSMENT and THERAPY PLAN:  IgG kappa MGUS CKD Stage III BMBX 02/2014 HTN BMBX 2015 with mild 5% polytypic plasmacytosis  -Patient has been lost to follow up.  -Will repeat her bone survey since its been over a year. -Repeat her myeloma labs. We will call her with the results.  -Tentatively plan to see her back in 6 months with repeat labs. If her labs show progression of her MGUS then we will call her back and get her in to be seen sooner. -continue follow up with [facility] nephrologist at [facility].  Orders Placed This Encounter  Procedures  . DG Bone Survey Met    Standing Status:   Future    Standing Expiration Date:   10/28/2018    Order Specific Question:   Reason for Exam (SYMPTOM  OR DIAGNOSIS REQUIRED)    Answer:   MGUS, survey for lytic bone lesions    Order Specific Question:   Preferred imaging location?    Answer:   [facility]    Order Specific Question:   Radiology Contrast Protocol - do NOT remove file path    Answer:   [email or web address]  . CBC with Differential    Standing Status:   Standing    Number of Occurrences:   2    Standing Expiration Date:   02/27/2019  . Comprehensive metabolic panel    Standing Status:   Standing    Number of Occurrences:   2    Standing Expiration Date:   08/28/2018  . Multiple Myeloma Panel (SPEP&IFE w/QIG)    Standing Status:   Standing    Number of Occurrences:   2    Standing Expiration Date:   08/28/2018  . Kappa/lambda light chains    Standing Status:   Standing    Number of Occurrences:   2    Standing Expiration Date:   08/28/2018  . Beta 2 microglobuline, serum    Standing Status:   Standing    Number of Occurrences:   2    Standing Expiration Date:   08/28/2018    All questions were answered. The  patient knows to call the clinic with any problems, questions or concerns. We can certainly see the patient much sooner if necessary.   This note was electronically signed.   Twana First, MD

## 2017-08-28 NOTE — Patient Instructions (Signed)
[facility] at [facility] Discharge Instructions  RECOMMENDATIONS MADE BY THE CONSULTANT AND ANY TEST RESULTS WILL BE SENT TO YOUR REFERRING PHYSICIAN.  You were seen by Dr. Zhou today  Thank you for choosing [facility] at [facility] to provide your oncology and hematology care.  To afford each patient quality time with our provider, please arrive at least 15 minutes before your scheduled appointment time.    If you have a lab appointment with the Cancer Center please come in thru the  Main Entrance and check in at the main information desk  You need to re-schedule your appointment should you arrive 10 or more minutes late.  We strive to give you quality time with our providers, and arriving late affects you and other patients whose appointments are after yours.  Also, if you no show three or more times for appointments you may be dismissed from the clinic at the providers discretion.     Again, thank you for choosing [facility].  Our hope is that these requests will decrease the amount of time that you wait before being seen by our physicians.       _____________________________________________________________  Should you have questions after your visit to [facility], please contact our office at [phone] between the hours of 8:30 a.m. and 4:30 p.m.  Voicemails left after 4:30 p.m. will not be returned until the following business day.  For prescription refill requests, have your pharmacy contact our office.       Resources For Cancer Patients and their Caregivers ? American Cancer Society: Can assist with transportation, wigs, general needs, runs Look Good Feel Better.        1-888-227-6333 ? Cancer Care: Provides financial assistance, online support groups, medication/co-pay assistance.  1-800-813-HOPE (4673) ? [facility] Assists [place] cancer patients and their families  through emotional , educational and financial support.  336-427-4357 ? [place] DSS Where to apply for food stamps, Medicaid and utility assistance. 336-342-1394 ? [facility]: Transportation to medical appointments. 336-347-2287 ? Social Security Administration: May apply for disability if have a Stage IV cancer. 336-342-7796 1-800-772-1213 ? [place] Aging, Disability and Transit Services: Assists with nutrition, care and transit needs. 336-349-2343  Cancer Center Support Programs: @10RELATIVEDAYS@ > Cancer Support Group  2nd Tuesday of the month 1pm-2pm, Journey Room  > Creative Journey  3rd Tuesday of the month 1130am-1pm, Journey Room  > Look Good Feel Better  1st Wednesday of the month 10am-12 noon, Journey Room (Call American Cancer Society to register 1-800-395-5775)   

## 2017-08-31 ENCOUNTER — Telehealth (HOSPITAL_COMMUNITY): Payer: Self-pay

## 2017-08-31 NOTE — Telephone Encounter (Signed)
Called pt to schedule a lab appt for tomorrow for labs they were unable to obtain on Monday. Pt instructed to come in tomorrow at 1100 am and understanding verbalized

## 2017-09-01 ENCOUNTER — Other Ambulatory Visit: Payer: Self-pay | Admitting: Pediatrics

## 2017-09-01 ENCOUNTER — Encounter (HOSPITAL_COMMUNITY): Payer: Medicare Other

## 2017-09-01 ENCOUNTER — Telehealth: Payer: Self-pay | Admitting: Pediatrics

## 2017-09-01 DIAGNOSIS — D472 Monoclonal gammopathy: Secondary | ICD-10-CM

## 2017-09-01 NOTE — Telephone Encounter (Signed)
Please review and advise.

## 2017-09-01 NOTE — Telephone Encounter (Signed)
Seen 07/25/17  Dr Evette Doffing  If approved route to nurse to [facility]  [phone]

## 2017-09-02 LAB — BETA 2 MICROGLOBULIN, SERUM: Beta-2 Microglobulin: 4.2 mg/L — ABNORMAL HIGH (ref 0.6–2.4)

## 2017-09-04 LAB — KAPPA/LAMBDA LIGHT CHAINS
Kappa free light chain: 75.6 mg/L — ABNORMAL HIGH (ref 3.3–19.4)
Kappa, lambda light chain ratio: 2.53 — ABNORMAL HIGH (ref 0.26–1.65)
Lambda free light chains: 29.9 mg/L — ABNORMAL HIGH (ref 5.7–26.3)

## 2017-09-05 NOTE — Telephone Encounter (Signed)
Hope you don't mind looking at this. [name]

## 2017-09-06 LAB — MULTIPLE MYELOMA PANEL, SERUM
Albumin SerPl Elph-Mcnc: 3.5 g/dL (ref 2.9–4.4)
Albumin/Glob SerPl: 1.1 (ref 0.7–1.7)
Alpha 1: 0.2 g/dL (ref 0.0–0.4)
Alpha2 Glob SerPl Elph-Mcnc: 0.7 g/dL (ref 0.4–1.0)
B-Globulin SerPl Elph-Mcnc: 1 g/dL (ref 0.7–1.3)
Gamma Glob SerPl Elph-Mcnc: 1.5 g/dL (ref 0.4–1.8)
Globulin, Total: 3.5 g/dL (ref 2.2–3.9)
IgA: 271 mg/dL (ref 87–352)
IgG (Immunoglobin G), Serum: 1240 mg/dL (ref 700–1600)
IgM (Immunoglobulin M), Srm: 190 mg/dL (ref 26–217)
M Protein SerPl Elph-Mcnc: 0.4 g/dL — ABNORMAL HIGH
Total Protein ELP: 7 g/dL (ref 6.0–8.5)

## 2017-09-15 ENCOUNTER — Other Ambulatory Visit: Payer: Self-pay | Admitting: Pediatrics

## 2017-09-15 DIAGNOSIS — F32A Depression, unspecified: Secondary | ICD-10-CM

## 2017-09-15 DIAGNOSIS — F329 Major depressive disorder, single episode, unspecified: Secondary | ICD-10-CM

## 2017-09-27 ENCOUNTER — Ambulatory Visit (INDEPENDENT_AMBULATORY_CARE_PROVIDER_SITE_OTHER): Payer: Medicare Other | Admitting: Pediatrics

## 2017-09-27 ENCOUNTER — Encounter: Payer: Self-pay | Admitting: Pediatrics

## 2017-09-27 VITALS — BP 128/73 | HR 78 | Temp 96.2°F | Ht 67.0 in | Wt 206.0 lb

## 2017-09-27 DIAGNOSIS — M5441 Lumbago with sciatica, right side: Secondary | ICD-10-CM

## 2017-09-27 DIAGNOSIS — G4700 Insomnia, unspecified: Secondary | ICD-10-CM

## 2017-09-27 DIAGNOSIS — F329 Major depressive disorder, single episode, unspecified: Secondary | ICD-10-CM | POA: Diagnosis not present

## 2017-09-27 DIAGNOSIS — G8929 Other chronic pain: Secondary | ICD-10-CM | POA: Diagnosis not present

## 2017-09-27 DIAGNOSIS — F32A Depression, unspecified: Secondary | ICD-10-CM

## 2017-09-27 MED ORDER — VENLAFAXINE HCL 50 MG PO TABS: 50.0000 mg | ORAL_TABLET | Freq: Two times a day (BID) | ORAL | 3 refills | Status: DC

## 2017-09-27 MED ORDER — CYCLOBENZAPRINE HCL 5 MG PO TABS: 5.0000 mg | ORAL_TABLET | Freq: Three times a day (TID) | ORAL | 2 refills | Status: DC | PRN

## 2017-09-27 MED ORDER — ZOLPIDEM TARTRATE 5 MG PO TABS: ORAL_TABLET | ORAL | 2 refills | Status: DC

## 2017-09-27 NOTE — Progress Notes (Signed)
  Subjective:   Patient ID: [name], female    DOB: [birth year], 66 y.o.   MRN: [id number] CC: f/u med problems  HPI: HONI NAME is a 66 y.o. female presenting for f/u  HA: mostly bothers her now in the morning Sometimes will bother her when she wakes up at night Changing pillow position helps Hurts mostly on R side of head Sleeps mostly on R side, is not able to fall asleep if on her L side, is bothered by hip pain L side Still has headaches sometimes during the day Not as debilitating as they were at her last visit Has appt with neurology upcoming  No snoring, doesn't wake herself up coughing/choking  CKD: following with nephrology, Cr most recently 1.93, GFR apprx 20  Vision sometimes blurry, over due for eye exam  HTN: checks at home, in 120s/60s  No falls, feeling steady on her feet Mood has been down Has been in [place] program Helping some Was started on effexor 2 mo ago Thinks mood has improved some while on it  Relevant past medical, surgical, family and social history reviewed. Allergies and medications reviewed and updated. History  Smoking Status  . Former Smoker  . Packs/day: 1.00  . Years: 30.00  . Types: Cigarettes  . Quit date: 11/13/2012  Smokeless Tobacco  . Never Used   ROS: Per HPI   Objective:    BP 128/73   Pulse 78   Temp (!) 96.2 F (35.7 C) (Oral)   Ht 5\' 7"  (1.702 m)   Wt 206 lb (93.4 kg)   BMI 32.26 kg/m   Wt Readings from Last 3 Encounters:  09/27/17 206 lb (93.4 kg)  08/28/17 203 lb 1.6 oz (92.1 kg)  07/25/17 209 lb (94.8 kg)    Gen: NAD, alert, cooperative with exam, NCAT EYES: no conjunctival injection, or no icterus ENT:  TMs pearly gray b/l, OP without erythema LYMPH: no cervical LAD CV: NRRR, normal S1/S2, no murmur, distal pulses 2+ b/l Resp: CTABL, no wheezes, normal WOB Abd: +BS, soft, NTND.  Ext: No edema, warm Neuro: Alert and oriented, strength equal b/l UE and LE, coordination grossly  normal Psych: normal affect, no thoughts of self harm  Assessment & Plan:  Bentlie was seen today for 8 week recheck med problems.  Diagnoses and all orders for this visit:  Insomnia, unspecified type Stable, continue below -     zolpidem (AMBIEN) 5 MG tablet; TAKE 1 TABLET BY MOUTH AT BEDTIME FOR SLEEP  Chronic right-sided low back pain with right-sided sciatica Stable, takes below -     cyclobenzaprine (FLEXERIL) 5 MG tablet; Take 1 tablet (5 mg total) by mouth 3 (three) times daily as needed. for muscle spams  Depression, unspecified depression type Ongoing symptoms, feels safe at home Increase to 50mg  BID Cont vBHI program -     venlafaxine (EFFEXOR) 50 MG tablet; Take 1 tablet (50 mg total) by mouth 2 (two) times daily.   Follow up plan: Return in about 2 months (around 11/27/2017). Assunta Found, MD [place]

## 2017-10-10 ENCOUNTER — Ambulatory Visit (INDEPENDENT_AMBULATORY_CARE_PROVIDER_SITE_OTHER): Payer: Medicare Other | Admitting: Neurology

## 2017-10-10 ENCOUNTER — Encounter: Payer: Self-pay | Admitting: Neurology

## 2017-10-10 VITALS — BP 112/66 | HR 72 | Resp 16 | Ht 67.0 in | Wt 206.0 lb

## 2017-10-10 DIAGNOSIS — G40909 Epilepsy, unspecified, not intractable, without status epilepticus: Secondary | ICD-10-CM

## 2017-10-10 DIAGNOSIS — G43719 Chronic migraine without aura, intractable, without status migrainosus: Secondary | ICD-10-CM | POA: Diagnosis not present

## 2017-10-10 DIAGNOSIS — R569 Unspecified convulsions: Secondary | ICD-10-CM

## 2017-10-10 MED ORDER — SUMATRIPTAN SUCCINATE 100 MG PO TABS: ORAL_TABLET | ORAL | 2 refills | Status: DC

## 2017-10-10 NOTE — Progress Notes (Signed)
NEUROLOGY CONSULTATION NOTE  MIKEL PYON MRN: [id number] DOB: [birth year]  Referring provider: Dr. Evette Doffing Primary care provider: Dr. Evette Doffing  Reason for consult:  migraines  HISTORY OF PRESENT ILLNESS: [name] is a 66 year old African American female with CKD, MGUS, HTN, depression, COPD, CHF and history of seizures vs psychogenic nonepileptic seizures who presents for headache.  History supplemented by prior neurologist's notes.  Onset:  66 years old Location:  Right sided from occipital region to the right eye Quality:  pounding Intensity:  severe Aura:  no Prodrome:  no Postdrome:  no Associated symptoms:  Nausea, sometimes vomiting, photophobia, phonophobia, sees spots.  She has not had any new worse headache of her life, waking up from sleep Duration:  2 to 3 days  Frequency:  15 headache days per month Frequency of abortive medication: 2 to 3 days per week Triggers/exacerbating factors:  stress Relieving factors:  nothing Activity:  aggravates  Past NSAIDS:  Meloxicam, ibuprofen, naproxen Past analgesics:  Fioricet, Tylenol, Excedrin Past abortive triptans:  no Past muscle relaxants:  baclofen Past anti-emetic:  Zofran ODT 4mg  Past antihypertensive medications:  atenolol 100mg , metoprolol Past antidepressant medications:  Cymbalta 60mg , citalopram Past anticonvulsant medications:  gabapentin 600mg  three times daily, Depakote Past vitamins/Herbal/Supplements:  no Other past therapies:  Botox (previously effective), lidocaine 4%  Current NSAIDS:  ASA 81mg  Current analgesics:  Tylenol Current triptans:  no Current anti-emetic:  no Current muscle relaxants:  Flexeril Current anti-anxiolytic:  BuSpar Current sleep aide:  trazodone, Ambien Current Antihypertensive medications:  amlodipine, hydralazine Current Antidepressant medications:  venlafaxine 50mg  twice daily Current Anticonvulsant medications:  Keppra 500mg  twice daily, topiramate 30mg   twice daily Current Vitamins/Herbal/Supplements:  calcium, D Current Antihistamines/Decongestants:  Flonase Other therapy:  no  Caffeine:  Coffee 1 to 2 times a week Alcohol:  no Smoker:  no Diet:  hydrates Exercise:  yes Depression/anxiety:  yes Sleep hygiene:  varies Family history of headache:  Mother  She also has history of seizures since she was a teenager.  She says they are generalized tonic clonic seizure with loss of consciousness, tongue biting but no incontinence.  They occur when she is upset or stressed.  Her last seizure was last year.  PNES was also considered.  EEG from 11/30/15 was normal.  She takes Keppra 500mg  twice daily.  Prior antiepileptic medication includes Dilantin and Depakote.   MRI Of brain from 09/14/15 personally reviewed and is unremarkable, showing only mild chronic small vessel disease.  08/28/17:  Na 134, K 5.1, Cl 102, CO2 19, glucose 97 BUN 39, Cr 2.16, t bili 0.6, ALP 148, AST 221 and ALT 15.  PAST MEDICAL HISTORY: Past Medical History:  Diagnosis Date  . Chronic headaches   . CKD (chronic kidney disease) stage 3, GFR 30-59 ml/min (HCC)   . Depression   . Encephalopathy   . Essential hypertension   . Hyperlipidemia   . 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 Osteoporosis   . Restrictive lung disease   . Right leg weakness   . Seizures [facility])    first sz age 40, last seizure Aug 2017  . URI (upper respiratory infection)     PAST SURGICAL HISTORY: Past Surgical History:  Procedure Laterality Date  . ABDOMINAL HYSTERECTOMY    . APPENDECTOMY    . BACK SURGERY  2002  . BREAST REDUCTION SURGERY    . CHOLECYSTECTOMY    . LUNG LOBECTOMY Right    Cyst  MEDICATIONS: Current Outpatient Medications on File Prior to Visit  Medication Sig Dispense Refill  . acetaminophen (TYLENOL) 500 MG tablet Take 1,000 mg by mouth every 6 (six) hours as needed for moderate pain.    [name] amLODipine (NORVASC) 10 MG tablet Take 1  tablet (10 mg total) by mouth daily. for blood pressure 30 tablet 5  . aspirin EC 81 MG tablet Take 81 mg by mouth daily.    . busPIRone (BUSPAR) 5 MG tablet Take 1 tablet (5 mg total) by mouth 3 (three) times daily. 90 tablet 2  . calcitRIOL (ROCALTROL) 0.5 MCG capsule Take 0.5 mcg by mouth daily.     . calcium acetate (PHOSLO) 667 MG capsule Take 1 capsule by mouth 3 (three) times daily.    . cholecalciferol (VITAMIN D) 1000 UNITS tablet Take 1,000 Units by mouth daily.    . cyclobenzaprine (FLEXERIL) 5 MG tablet Take 1 tablet (5 mg total) by mouth 3 (three) times daily as needed. for muscle spams 30 tablet 2  . ferrous sulfate 325 (65 FE) MG tablet Take 325 mg by mouth daily with breakfast.    . fluticasone (FLONASE) 50 MCG/ACT nasal spray Place 2 sprays into both nostrils daily. 16 g 2  . Fluticasone-Salmeterol (ADVAIR) 100-50 MCG/DOSE AEPB Inhale 1 puff into the lungs 2 (two) times daily. 1 each 3  . gabapentin (NEURONTIN) 600 MG tablet Take 1 tablet by mouth three times daily. 90 tablet 2  . hydrALAZINE (APRESOLINE) 50 MG tablet Take 1 tablet (50 mg total) by mouth 3 times daily. 90 tablet 0  . hydrOXYzine (ATARAX/VISTARIL) 10 MG tablet Take 1 tablet (10 mg total) by mouth 3 (three) times daily as needed. 90 tablet 5  . Ipratropium-Albuterol (COMBIVENT RESPIMAT) 20-100 MCG/ACT AERS respimat INHALE 2 PUFFS INTO THE LUNGS TWICE A DAY 4 g 4  . levETIRAcetam (KEPPRA) 500 MG tablet Take 1 tablet (500 mg total) by mouth 2 (two) times daily. 60 tablet 5  . pantoprazole (PROTONIX) 40 MG tablet Take 1 tablet (40 mg total) by mouth daily. 30 tablet 5  . pravastatin (PRAVACHOL) 40 MG tablet Take 1 tablet (40 mg total) by mouth daily. 90 tablet 3  . topiramate (TOPAMAX) 15 MG capsule Take 2 capsules (30 mg total) by mouth 2 (two) times daily. 120 capsule 2  . traZODone (DESYREL) 50 MG tablet Take ONE-HALF (0.5) TO ONE (1) tablet (25-50 mg total) by mouth at bedtime as needed for sleep. 30 tablet 2  .  venlafaxine (EFFEXOR) 50 MG tablet Take 1 tablet (50 mg total) by mouth 2 (two) times daily. 60 tablet 3  . zolpidem (AMBIEN) 5 MG tablet TAKE 1 TABLET BY MOUTH AT BEDTIME FOR SLEEP 10 tablet 2   No current facility-administered medications on file prior to visit.     ALLERGIES: Allergies  Allergen Reactions  . Rofecoxib Rash    Reaction to VIOXX  . Asa [Aspirin]     High dose Aspirin causes itching but can take 81mg    . Penicillins Hives    Has patient had a PCN reaction causing immediate rash, facial/tongue/throat swelling, SOB or lightheadedness with hypotension: Yes Has patient had a PCN reaction causing severe rash involving mucus membranes or skin necrosis: Yes-rash/hives Has patient had a PCN reaction that required hospitalization No Has patient had a PCN reaction occurring within the last 10 years: Yes If all of the above answers are "NO", then may proceed with Cephalosporin use.   . Sulfonamide Derivatives Hives  .  Tylenol With Codeine #3 [Acetaminophen-Codeine] Itching    FAMILY HISTORY: Family History  Problem Relation Age of Onset  . Hypertension Mother   . Heart disease Mother   . Diabetes Mother   . Kidney disease Mother   . Arthritis Mother   . Hypertension Father   . Hypertension Paternal Aunt   . Kidney disease Paternal Aunt   . Hypertension Son     SOCIAL HISTORY: Social History   Socioeconomic History  . Marital status: Widowed    Spouse name: Not on file  . Number of children: 3  . Years of education: 69  . Highest education level: Not on file  Social Needs  . Financial resource strain: Not on file  . Food insecurity - worry: Not on file  . Food insecurity - inability: Not on file  . Transportation needs - medical: Not on file  . Transportation needs - non-medical: Not on file  Occupational History  . Occupation: Unemployed  Tobacco Use  . Smoking status: Former Smoker    Packs/day: 1.00    Years: 30.00    Pack years: 30.00    Types:  Cigarettes    Last attempt to quit: 11/13/2012    Years since quitting: 4.9  . Smokeless tobacco: Never Used  Substance and Sexual Activity  . Alcohol use: No  . Drug use: No  . Sexual activity: Not on file  Other Topics Concern  . Not on file  Social History Narrative   Lives with son, aunt   Caffeine - coffee 1 cup daily    REVIEW OF SYSTEMS: Constitutional: No fevers, chills, or sweats, no generalized fatigue, change in appetite Eyes: No visual changes, double vision, eye pain Ear, nose and throat: No hearing loss, ear pain, nasal congestion, sore throat Cardiovascular: No chest pain, palpitations Respiratory:  No shortness of breath at rest or with exertion, wheezes GastrointestinaI: No nausea, vomiting, diarrhea, abdominal pain, fecal incontinence Genitourinary:  No dysuria, urinary retention or frequency Musculoskeletal:  No neck pain, back pain Integumentary: No rash, pruritus, skin lesions Neurological: as above Psychiatric: No depression, insomnia, anxiety Endocrine: No palpitations, fatigue, diaphoresis, mood swings, change in appetite, change in weight, increased thirst Hematologic/Lymphatic:  No purpura, petechiae. Allergic/Immunologic: no itchy/runny eyes, nasal congestion, recent allergic reactions, rashes  PHYSICAL EXAM: Vitals:   10/10/17 0948  BP: 112/66   General: No acute distress.  Head:  Normocephalic/atraumatic Eyes:  fundi examined but not visualized Neck: supple, no paraspinal tenderness, full range of motion Back: No paraspinal tenderness Heart: regular rate and rhythm Lungs: Clear to auscultation bilaterally. Vascular: No carotid bruits. Neurological Exam: Mental status: alert and oriented to person, place, and time, recent and remote memory intact, fund of knowledge intact, attention and concentration intact, speech fluent and not dysarthric, language intact. Cranial nerves: CN I: not tested CN II: pupils equal, round and reactive to light,  visual fields intact CN III, IV, VI:  full range of motion, no nystagmus, no ptosis CN V: facial sensation intact CN VII: upper and lower face symmetric CN VIII: hearing intact CN IX, X: gag intact, uvula midline CN XI: sternocleidomastoid and trapezius muscles intact CN XII: tongue midline Bulk & Tone: normal, no fasciculations. Motor:  5/5 throughout  Sensation: temperature and vibration sensation intact. Deep Tendon Reflexes:  2+ throughout, toes downgoing.  Finger to nose testing:  Without dysmetria.  Heel to shin:  Without dysmetria.  Gait:  Normal station and stride.  Able to turn. Romberg negative.  IMPRESSION:  Chronic migraine Seizure disorder vs psychogenic nonepileptic spells  PLAN: 1.  Will restart Botox since it was previously effective 2.  She will try sumatriptan 100mg  for abortive therapy 3.  Continue Keppra 500mg  twice daily and topiramate 30mg  twice daily 4.  Follow up for Botox  Thank you for allowing me to take part in the care of this patient.  Metta Clines, DO  CC: Assunta Found, MD

## 2017-10-10 NOTE — Patient Instructions (Addendum)
Migraine Recommendations: 1.  We will retry Botox since it was previously effective 2.  Take sumatriptan 100mg  at earliest onset of headache.  May repeat dose once in 2 hours if needed.  Do not exceed two tablets in 24 hours. 3.  Limit use of pain relievers to no more than 2 days out of the week.  These medications include acetaminophen, ibuprofen, triptans and narcotics.  This will help reduce risk of rebound headaches. 4.  Be aware of common food triggers such as processed sweets, processed foods with nitrites (such as deli meat, hot dogs, sausages), foods with MSG, alcohol (such as wine), chocolate, certain cheeses, certain fruits (dried fruits, bananas, some citrus fruit), vinegar, diet soda. 4.  Avoid caffeine 5.  Routine exercise 6.  Proper sleep hygiene 7.  Stay adequately hydrated with water 8.  Keep a headache diary. 9.  Maintain proper stress management. 10.  Do not skip meals. 11.  Consider supplements:  Magnesium citrate 400mg  to 600mg  daily, riboflavin 400mg , Coenzyme Q 10 100mg  three times daily 12.  Follow up for Botox  Seizure Recommendations: 1. If medication has been prescribed for you to prevent seizures, take it exactly as directed.  Do not stop taking the medicine without talking to your doctor first, even if you have not had a seizure in a long time.   2. Avoid activities in which a seizure would cause danger to yourself or to others.  Don't operate dangerous machinery, swim alone, or climb in high or dangerous places, such as on ladders, roofs, or girders.  Do not drive unless your doctor says you may.  3. If you have any warning that you may have a seizure, lay down in a safe place where you can't hurt yourself.    4.  No driving for 6 months from last seizure, as per [facility].   Please refer to the following link on the [employer] website for more information: http://[email or web address]/[email or web address]/drivingu.cfm    5.  Maintain good sleep hygiene.  6.  Notify your neurology if you are planning pregnancy or if you become pregnant.  7.  Contact your doctor if you have any problems that may be related to the medicine you are taking.  8.  Call 911 and bring the patient back to the ED if:        A.  The seizure lasts longer than 5 minutes.       B.  The patient doesn't awaken shortly after the seizure  C.  The patient has new problems such as difficulty seeing, speaking or moving  D.  The patient was injured during the seizure  E.  The patient has a temperature over 102 F (39C)  F.  The patient vomited and now is having trouble breathing

## 2017-10-12 ENCOUNTER — Ambulatory Visit: Payer: Medicare Other | Admitting: Audiology

## 2017-10-20 ENCOUNTER — Telehealth (HOSPITAL_COMMUNITY): Payer: Self-pay

## 2017-11-09 ENCOUNTER — Other Ambulatory Visit: Payer: Self-pay | Admitting: Pediatrics

## 2017-11-09 DIAGNOSIS — M5441 Lumbago with sciatica, right side: Secondary | ICD-10-CM

## 2017-11-09 DIAGNOSIS — G4700 Insomnia, unspecified: Secondary | ICD-10-CM

## 2017-11-09 DIAGNOSIS — IMO0002 Reserved for concepts with insufficient information to code with codable children: Secondary | ICD-10-CM

## 2017-11-09 DIAGNOSIS — G43709 Chronic migraine without aura, not intractable, without status migrainosus: Secondary | ICD-10-CM

## 2017-11-09 DIAGNOSIS — G8929 Other chronic pain: Secondary | ICD-10-CM

## 2017-11-10 ENCOUNTER — Other Ambulatory Visit: Payer: Self-pay | Admitting: Pediatrics

## 2017-11-10 DIAGNOSIS — IMO0002 Reserved for concepts with insufficient information to code with codable children: Secondary | ICD-10-CM

## 2017-11-10 DIAGNOSIS — G43709 Chronic migraine without aura, not intractable, without status migrainosus: Secondary | ICD-10-CM

## 2017-11-13 NOTE — Progress Notes (Signed)
Rcvd fax from [employer] for botox approval [id number] through 02/08/2018

## 2017-11-14 ENCOUNTER — Telehealth: Payer: Self-pay | Admitting: Neurology

## 2017-11-14 NOTE — Telephone Encounter (Signed)
[employer] left a voicemail message saying pt was approved for Botox CB# [phone]

## 2017-11-17 ENCOUNTER — Ambulatory Visit (INDEPENDENT_AMBULATORY_CARE_PROVIDER_SITE_OTHER): Payer: Medicare Other | Admitting: Neurology

## 2017-11-17 DIAGNOSIS — G43719 Chronic migraine without aura, intractable, without status migrainosus: Secondary | ICD-10-CM | POA: Diagnosis not present

## 2017-11-17 MED ORDER — ONABOTULINUMTOXINA 100 UNITS IJ SOLR
155.0000 [IU] | Freq: Once | INTRAMUSCULAR | Status: AC
  Administered 2017-11-17: 155 [IU] via INTRAMUSCULAR

## 2017-11-17 NOTE — Progress Notes (Signed)
[facility]   Procedure Note Botox  Attending: Dr. Metta Clines  Preoperative Diagnosis(es): Chronic migraine  Consent obtained from: The patient Benefits discussed included, but were not limited to decreased muscle tightness, increased joint range of motion, and decreased pain.  Risk discussed included, but were not limited pain and discomfort, bleeding, bruising, excessive weakness, venous thrombosis, muscle atrophy and dysphagia.  Anticipated outcomes of the procedure as well as he risks and benefits of the alternatives to the procedure, and the roles and tasks of the personnel to be involved, were discussed with the patient, and the patient consents to the procedure and agrees to proceed. A copy of the patient medication guide was given to the patient which explains the blackbox warning.  Patients identity and treatment sites confirmed Yes.  .  Details of Procedure: Skin was cleaned with alcohol. Prior to injection, the needle plunger was aspirated to make sure the needle was not within a blood vessel.  There was no blood retrieved on aspiration.    Following is a summary of the muscles injected  And the amount of Botulinum toxin used:  Dilution 200 units of Botox was reconstituted with 4 ml of preservative free normal saline. Time of reconstitution: At the time of the office visit (<30 minutes prior to injection)   Injections  155 total units of Botox was injected with a 30 gauge needle.  Injection Sites: L occipitalis: 15 units- 3 sites  R occiptalis: 15 units- 3 sites  L upper trapezius: 15 units- 3 sites R upper trapezius: 15 units- 3 sits          L paraspinal: 10 units- 2 sites R paraspinal: 10 units- 2 sites  Face L frontalis(2 injection sites):10 units   R frontalis(2 injection sites):10 units         L corrugator: 5 units   R corrugator: 5 units           Procerus: 5 units   L temporalis: 20 units R temporalis: 20 units   Agent:  200 units of botulinum Type  A (Onobotulinum Toxin type A) was reconstituted with 4 ml of preservative free normal saline.  Time of reconstitution: At the time of the office visit (<30 minutes prior to injection)     Total injected (Units): 155  Total wasted (Units): 0  Patient tolerated procedure well without complications.   Reinjection is anticipated in 3 months. Return to clinic in 4 1/2 months.

## 2017-11-23 ENCOUNTER — Other Ambulatory Visit: Payer: Self-pay | Admitting: Pediatrics

## 2017-11-23 DIAGNOSIS — G4700 Insomnia, unspecified: Secondary | ICD-10-CM

## 2017-11-24 NOTE — Telephone Encounter (Signed)
Last seen 09/27/17  Dr Evette Doffing  If approved route to nurse to call into [facility]  [phone]

## 2017-11-29 ENCOUNTER — Ambulatory Visit (INDEPENDENT_AMBULATORY_CARE_PROVIDER_SITE_OTHER): Payer: Medicare Other | Admitting: Pediatrics

## 2017-11-29 ENCOUNTER — Encounter: Payer: Self-pay | Admitting: Pediatrics

## 2017-11-29 VITALS — BP 117/69 | HR 75 | Temp 97.2°F | Ht 67.0 in | Wt 209.4 lb

## 2017-11-29 DIAGNOSIS — R569 Unspecified convulsions: Secondary | ICD-10-CM

## 2017-11-29 DIAGNOSIS — F32A Depression, unspecified: Secondary | ICD-10-CM

## 2017-11-29 DIAGNOSIS — F329 Major depressive disorder, single episode, unspecified: Secondary | ICD-10-CM

## 2017-11-29 DIAGNOSIS — E785 Hyperlipidemia, unspecified: Secondary | ICD-10-CM | POA: Diagnosis not present

## 2017-11-29 DIAGNOSIS — F419 Anxiety disorder, unspecified: Secondary | ICD-10-CM | POA: Diagnosis not present

## 2017-11-29 DIAGNOSIS — G4700 Insomnia, unspecified: Secondary | ICD-10-CM

## 2017-11-29 DIAGNOSIS — I10 Essential (primary) hypertension: Secondary | ICD-10-CM

## 2017-11-29 DIAGNOSIS — K219 Gastro-esophageal reflux disease without esophagitis: Secondary | ICD-10-CM

## 2017-11-29 DIAGNOSIS — M25551 Pain in right hip: Secondary | ICD-10-CM

## 2017-11-29 MED ORDER — LEVETIRACETAM 500 MG PO TABS: 500.0000 mg | ORAL_TABLET | Freq: Two times a day (BID) | ORAL | 5 refills | Status: DC

## 2017-11-29 MED ORDER — PRAVASTATIN SODIUM 40 MG PO TABS: 40.0000 mg | ORAL_TABLET | Freq: Every day | ORAL | 3 refills | Status: DC

## 2017-11-29 MED ORDER — AMLODIPINE BESYLATE 10 MG PO TABS: 10.0000 mg | ORAL_TABLET | Freq: Every day | ORAL | 5 refills | Status: DC

## 2017-11-29 MED ORDER — TRAZODONE HCL 50 MG PO TABS: ORAL_TABLET | ORAL | 2 refills | Status: DC

## 2017-11-29 MED ORDER — BUSPIRONE HCL 5 MG PO TABS: 5.0000 mg | ORAL_TABLET | Freq: Three times a day (TID) | ORAL | 2 refills | Status: DC

## 2017-11-29 MED ORDER — PANTOPRAZOLE SODIUM 40 MG PO TBEC: 40.0000 mg | DELAYED_RELEASE_TABLET | Freq: Every day | ORAL | 5 refills | Status: DC

## 2017-11-29 NOTE — Progress Notes (Signed)
Subjective:   Patient ID: [name], female    DOB: 21-Sep-1951, 67 y.o.   MRN: [id number] CC: Follow-up (2 month) Multiple medical problems HPI: [name] is a 67 y.o. female presenting for Follow-up (2 month)  HA: recent botox injections, thinks helped some  R hip bothering her when she first gets up or when she has been walking a long time, ongoing off and on for the last few months Takes Tylenol, has not been able to take NSAIDs due to renal insufficiency Had x-ray done about a year ago, no arthritis seen at that time  Depression: mood has been down, says every day she is bothered with it Cries, doesn't want to do anything, doesn't want to be around other people Was in [place] program, says she isnt getting phone calls anymore Says she goes from feeling really happy to really sad every couple days Feeling happy lasts sometimes for a couple of weeks  CKD: Following with nephrology at [facility]  MGUS: follows with oncology  Insomnia: taking trazodone nightly, Ambien as needed  Relevant past medical, surgical, family and social history reviewed. Allergies and medications reviewed and updated. Social History   Tobacco Use  Smoking Status Former Smoker  . Packs/day: 1.00  . Years: 30.00  . Pack years: 30.00  . Types: Cigarettes  . Last attempt to quit: 11/13/2012  . Years since quitting: 5.0  Smokeless Tobacco Never Used   ROS: Per HPI   Objective:    BP 117/69   Pulse 75   Temp (!) 97.2 F (36.2 C) (Oral)   Ht 5\' 7"  (1.702 m)   Wt 209 lb 6.4 oz (95 kg)   BMI 32.80 kg/m   Wt Readings from Last 3 Encounters:  11/29/17 209 lb 6.4 oz (95 kg)  10/10/17 206 lb (93.4 kg)  09/27/17 206 lb (93.4 kg)    Gen: NAD, alert, cooperative with exam, NCAT EYES: EOMI, no conjunctival injection, or no icterus CV: NRRR, normal S1/S2, no murmur, distal pulses 2+ b/l Resp: CTABL, no wheezes, normal WOB Abd: +BS, soft, NTND. no guarding or organomegaly Ext: No  edema, warm Neuro: Alert and oriented MSK: Pain with right hip flexion against gravity, internal and external rotation of right hip Normal range of motion left hip, no pain Psych: Poor eye contact, mood is "down"  Assessment & Plan:  Tabytha was seen today for follow-up.  Diagnoses and all orders for this visit:  Right hip pain Tylenol as needed -     Ambulatory referral to Orthopedic Surgery  Essential hypertension Stable today, continue amlodipine, hydralazine -     amLODipine (NORVASC) 10 MG tablet; Take 1 tablet (10 mg total) by mouth daily. for blood pressure  Anxiety Some improvement with below, not able to increase given CKD Referral in to virtual behavioral health, patient aware -     busPIRone (BUSPAR) 5 MG tablet; Take 1 tablet (5 mg total) by mouth 3 (three) times daily.  Depression Ongoing symptoms, on venlafaxine With CKD, will refer to virtual behavioral health for counseling and medication max  Seizures ([place]) Stable on the low continue -     levETIRAcetam (KEPPRA) 500 MG tablet; Take 1 tablet (500 mg total) by mouth 2 (two) times daily.  Gastroesophageal reflux disease without esophagitis Stable, continue -     pantoprazole (PROTONIX) 40 MG tablet; Take 1 tablet (40 mg total) by mouth daily.  Hyperlipidemia Stable, continue -     pravastatin (PRAVACHOL) 40 MG tablet;  Take 1 tablet (40 mg total) by mouth daily.  Insomnia Stable, continue as needed Ambien and trazodone -     traZODone (DESYREL) 50 MG tablet; Take ONE-HALF (0.5) TO ONE (1) tablet (25-50 mg total) by mouth at bedtime as needed for sleep.   Follow up plan: Return in about 3 months (around 02/27/2018). Assunta Found, MD [place]

## 2017-11-30 ENCOUNTER — Telehealth (INDEPENDENT_AMBULATORY_CARE_PROVIDER_SITE_OTHER): Payer: Medicare Other

## 2017-11-30 DIAGNOSIS — F329 Major depressive disorder, single episode, unspecified: Secondary | ICD-10-CM

## 2017-11-30 DIAGNOSIS — F32A Depression, unspecified: Secondary | ICD-10-CM

## 2017-11-30 DIAGNOSIS — F419 Anxiety disorder, unspecified: Secondary | ICD-10-CM
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
## 2017-11-30 NOTE — Progress Notes (Signed)
[facility] Intake Assessment

## 2017-12-05 ENCOUNTER — Other Ambulatory Visit: Payer: Self-pay | Admitting: Neurology

## 2017-12-05 ENCOUNTER — Other Ambulatory Visit: Payer: Self-pay | Admitting: Nurse Practitioner

## 2017-12-05 ENCOUNTER — Other Ambulatory Visit: Payer: Self-pay | Admitting: Pediatrics

## 2017-12-05 DIAGNOSIS — G4700 Insomnia, unspecified: Secondary | ICD-10-CM

## 2017-12-05 DIAGNOSIS — G43709 Chronic migraine without aura, not intractable, without status migrainosus: Secondary | ICD-10-CM

## 2017-12-05 DIAGNOSIS — IMO0002 Reserved for concepts with insufficient information to code with codable children: Secondary | ICD-10-CM

## 2017-12-06 ENCOUNTER — Other Ambulatory Visit: Payer: Self-pay | Admitting: Pediatrics

## 2017-12-06 DIAGNOSIS — G43709 Chronic migraine without aura, not intractable, without status migrainosus: Secondary | ICD-10-CM

## 2017-12-06 DIAGNOSIS — IMO0002 Reserved for concepts with insufficient information to code with codable children: Secondary | ICD-10-CM

## 2017-12-06 NOTE — Telephone Encounter (Signed)
Patient last seen in office on 11/29/17. Rx last filled on 11/24/17 for #10. Please advise on RF

## 2017-12-07 ENCOUNTER — Telehealth (HOSPITAL_COMMUNITY): Payer: Self-pay | Admitting: Psychiatry

## 2017-12-07 NOTE — Telephone Encounter (Signed)
[facility] consult note  Discussed the case with [name], [facility] specialist. I have reviewed the patient record in detail.  [name] is a 67 y.o. year old female with history of depression, CKD, MGUS, seizure. [facility] is consulted for depression. Per report, patient endorses worsening depression and anxiety in the setting of family death (aunt in 05/26/2017). Per chart review, Effexor was uptitrated in 08/2017.   Current Outpatient Medications on File Prior to Visit  Medication Sig Dispense Refill  . acetaminophen (TYLENOL) 500 MG tablet Take 1,000 mg by mouth every 6 (six) hours as needed for moderate pain.    [name] amLODipine (NORVASC) 10 MG tablet Take 1 tablet (10 mg total) by mouth daily. for blood pressure 30 tablet 5  . aspirin EC 81 MG tablet Take 81 mg by mouth daily.    . busPIRone (BUSPAR) 5 MG tablet Take 1 tablet (5 mg total) by mouth 3 (three) times daily. 90 tablet 2  . calcitRIOL (ROCALTROL) 0.5 MCG capsule Take 0.5 mcg by mouth daily.     . calcium acetate (PHOSLO) 667 MG capsule Take 1 capsule by mouth 3 (three) times daily.    . cholecalciferol (VITAMIN D) 1000 UNITS tablet Take 1,000 Units by mouth daily.    . ferrous sulfate 325 (65 FE) MG tablet Take 325 mg by mouth daily with breakfast.    . fluticasone (FLONASE) 50 MCG/ACT nasal spray Place 2 sprays into both nostrils daily. 16 g 2  . Fluticasone-Salmeterol (ADVAIR) 100-50 MCG/DOSE AEPB Inhale 1 puff into the lungs 2 (two) times daily. 1 each 3  . gabapentin (NEURONTIN) 600 MG tablet Take 1 tablet by mouth three times daily. 90 tablet 2  . hydrALAZINE (APRESOLINE) 50 MG tablet Take 1 tablet (50 mg total) by mouth 3 times daily. 90 tablet 0  . hydrOXYzine (ATARAX/VISTARIL) 10 MG tablet Take 1 tablet (10 mg total) by mouth 3 (three) times daily as needed. 90 tablet 5  . Ipratropium-Albuterol (COMBIVENT RESPIMAT) 20-100 MCG/ACT AERS respimat INHALE 2 PUFFS INTO THE LUNGS TWICE A DAY 4 g 4   . levETIRAcetam (KEPPRA) 500 MG tablet Take 1 tablet (500 mg total) by mouth 2 (two) times daily. 60 tablet 5  . pantoprazole (PROTONIX) 40 MG tablet Take 1 tablet (40 mg total) by mouth daily. 30 tablet 5  . pravastatin (PRAVACHOL) 40 MG tablet Take 1 tablet (40 mg total) by mouth daily. 90 tablet 3  . SUMAtriptan (IMITREX) 100 MG tablet Take 1 tablet earliest onset of migraine.  May repeat once in 2 hours if headache persists or recurs. 10 tablet 2  . topiramate (TOPAMAX) 15 MG capsule Take 2 capsules (30 mg total) by mouth 2 (two) times daily. 120 capsule 0  . traZODone (DESYREL) 50 MG tablet Take ONE-HALF (0.5) TO ONE (1) tablet (25-50 mg total) by mouth at bedtime as needed for sleep. 30 tablet 2  . venlafaxine (EFFEXOR) 50 MG tablet Take 1 tablet (50 mg total) by mouth 2 (two) times daily. 60 tablet 3  . zolpidem (AMBIEN) 5 MG tablet TAKE 1 TABLET BY MOUTH AT BEDTIME FOR SLEEP 10 tablet 0   No current facility-administered medications on file prior to visit.      Past psychiatry history Outpatient: denies Psychiatry admission:  [facility] in 2005  Previous suicide attempt: denies recent attempt Past trials of medication: sertraline, duloxetine, quetiapine History of violence:   Depression screen [facility] 2/9 11/30/2017 11/29/2017 09/27/2017  Decreased Interest 2 0 2  Down, Depressed, Hopeless 2 0 1  PHQ - 2 Score 4 0 3  Altered sleeping 2 - 3  Tired, decreased energy 3 - 0  Change in appetite 0 - 0  Feeling bad or failure about yourself  0 - 0  Trouble concentrating 0 - 0  Moving slowly or fidgety/restless 0 - 0  Suicidal thoughts 0 - 0  PHQ-9 Score 9 - 6  Difficult doing work/chores - - -  Some recent data might be hidden   GAD 7 : Generalized Anxiety Score 11/30/2017 04/19/2017  Nervous, Anxious, on Edge 3 1  Control/stop worrying 3 1  Worry too much - different things 2 1  Trouble relaxing 3 1  Restless 2 1  Easily annoyed or irritable 2 1  Afraid -  awful might happen 1 1  Total GAD 7 Score 16 7  Anxiety Difficulty - Somewhat difficult   Assessment [name] is a 67 y.o. year old female with history of depression, CKD, MGUS,  hyperlipidemia, seizure. [facility] is consulted for depression.   # MDD, mild  # Unspecified anxiety disorder Patient reports worsening depression and anxiety in the setting of family death (aunt in 06/03/17).  Will uptitrate Effexor to target depression and anxiety. Will do slow uptitration given CKD (Ccr 38, Cre 2.16 08/2017). Noted that careful dose adjustment is necessary with venlafaxine in patient with CKD. Regular monitoring of blood pressure is recommended. Other antidepressants such as sertraline or fluoxetine does not require dose adjustment; consider switching to other SSRI (except Paxil) if patient is amenable and/or there is limited benefit from Effexor. Will continue buspar for anxiety.   Recommendation - Would recommend increase Effexor IR 75 mg BID, monitor blood pressure OR consider cross tapering to other SSRI (except Paxil) - Continue buspar 5 mg TID - Consider rechecking TSH  - On trazodone 25-50 mg qhs prn for insomnia, Ambien 5 mg qhsprn for insomnia  Thank you for your consult. We will continue to follow the patient. Please contact [facility] for any questions or concerns.

## 2017-12-11 NOTE — Telephone Encounter (Signed)
Phoned in.

## 2017-12-15 ENCOUNTER — Other Ambulatory Visit: Payer: Self-pay | Admitting: Pediatrics

## 2017-12-15 ENCOUNTER — Telehealth: Payer: Self-pay | Admitting: Pediatrics

## 2017-12-15 DIAGNOSIS — F329 Major depressive disorder, single episode, unspecified: Secondary | ICD-10-CM

## 2017-12-15 DIAGNOSIS — F32A Depression, unspecified: Secondary | ICD-10-CM

## 2017-12-15 MED ORDER — VENLAFAXINE HCL 75 MG PO TABS: 75.0000 mg | ORAL_TABLET | Freq: Two times a day (BID) | ORAL | 2 refills | Status: DC

## 2017-12-15 NOTE — Telephone Encounter (Signed)
Please let pt know--to improve anxiety/depression symptoms I sent in higher dose of venlafaxine, should take 75mg  twice a day instead of 50mg  twice a day. Needs f/u in 4 weeks.

## 2017-12-18 NOTE — Telephone Encounter (Signed)
Please let pt know--to improve anxiety/depression symptoms I sent in higher dose of venlafaxine, should take 75mg  twice a day instead of 50mg  twice a day. Needs f/u in 4 weeks.

## 2017-12-19 NOTE — Telephone Encounter (Signed)
Pt notified of recommendation Verbalizes understanding 

## 2017-12-21 DIAGNOSIS — M7061 Trochanteric bursitis, right hip: Secondary | ICD-10-CM | POA: Insufficient documentation

## 2017-12-22 ENCOUNTER — Other Ambulatory Visit: Payer: Self-pay | Admitting: Neurology

## 2017-12-30 ENCOUNTER — Telehealth: Payer: Self-pay

## 2017-12-30 DIAGNOSIS — F329 Major depressive disorder, single episode, unspecified: Secondary | ICD-10-CM

## 2017-12-30 DIAGNOSIS — F419 Anxiety disorder, unspecified: Secondary | ICD-10-CM

## 2017-12-30 DIAGNOSIS — F32A Depression, unspecified: Secondary | ICD-10-CM

## 2017-12-30 NOTE — Progress Notes (Signed)
[facility] Telephone Follow-up  MRN: [id number] NAME: [name] Date: 12/30/17 Time of Assessment: 4:08 PM Call number: 2/6  Reason for call today: Reason for Contact: PHQ9-2 weeks  PHQ-9 Scores:  Depression screen [facility] 2/9 12/30/2017 11/30/2017 11/29/2017 09/27/2017 07/25/2017  Decreased Interest 3 2 0 2 3  Down, Depressed, Hopeless 3 2 0 1 3  PHQ - 2 Score 6 4 0 3 6  Altered sleeping 1 2 - 3 3  Tired, decreased energy 2 3 - 0 3  Change in appetite 0 0 - 0 0  Feeling bad or failure about yourself  2 0 - 0 0  Trouble concentrating 0 0 - 0 0  Moving slowly or fidgety/restless 0 0 - 0 0  Suicidal thoughts 0 0 - 0 0  PHQ-9 Score 11 9 - 6 12  Difficult doing work/chores - - - - -  Some recent data might be hidden     .[name] GAD 7 : Generalized Anxiety Score 12/30/2017 11/30/2017 04/19/2017  Nervous, Anxious, on Edge 2 3 1   Control/stop worrying 3 3 1   Worry too much - different things 2 2 1   Trouble relaxing 3 3 1   Restless 1 2 1   Easily annoyed or irritable 1 2 1   Afraid - awful might happen 1 1 1   Total GAD 7 Score 13 16 7   Anxiety Difficulty - - Somewhat difficult     Stress Current stressors: Current Stressors: Family death, Other (Comment)(Hip and Back pain ) Sleep: Sleep: Difficulty falling asleep Appetite: Appetite: Decreased Coping ability: Coping ability: Overwhelmed Patient taking medications as prescribed: Patient taking medications as prescribed: Yes, No prescribed medications  Current medications:  Outpatient Encounter Medications as of 12/30/2017  Medication Sig  . acetaminophen (TYLENOL) 500 MG tablet Take 1,000 mg by mouth every 6 (six) hours as needed for moderate pain.  [name] amLODipine (NORVASC) 10 MG tablet Take 1 tablet (10 mg total) by mouth daily. for blood pressure  . aspirin EC 81 MG tablet Take 81 mg by mouth daily.  . busPIRone (BUSPAR) 5 MG tablet Take 1 tablet (5 mg total) by mouth 3 (three) times daily.  . calcitRIOL (ROCALTROL) 0.5 MCG  capsule Take 0.5 mcg by mouth daily.   . calcium acetate (PHOSLO) 667 MG capsule Take 1 capsule by mouth 3 (three) times daily.  . cholecalciferol (VITAMIN D) 1000 UNITS tablet Take 1,000 Units by mouth daily.  . ferrous sulfate 325 (65 FE) MG tablet Take 325 mg by mouth daily with breakfast.  . fluticasone (FLONASE) 50 MCG/ACT nasal spray Place 2 sprays into both nostrils daily.  . Fluticasone-Salmeterol (ADVAIR) 100-50 MCG/DOSE AEPB Inhale 1 puff into the lungs 2 (two) times daily.  [name] gabapentin (NEURONTIN) 600 MG tablet Take 1 tablet by mouth three times daily.  . hydrALAZINE (APRESOLINE) 50 MG tablet Take 1 tablet (50 mg total) by mouth 3 times daily.  . hydrOXYzine (ATARAX/VISTARIL) 10 MG tablet Take 1 tablet (10 mg total) by mouth 3 (three) times daily as needed.  . Ipratropium-Albuterol (COMBIVENT RESPIMAT) 20-100 MCG/ACT AERS respimat INHALE 2 PUFFS INTO THE LUNGS TWICE A DAY  . levETIRAcetam (KEPPRA) 500 MG tablet Take 1 tablet (500 mg total) by mouth 2 (two) times daily.  . pantoprazole (PROTONIX) 40 MG tablet Take 1 tablet (40 mg total) by mouth daily.  . pravastatin (PRAVACHOL) 40 MG tablet Take 1 tablet (40 mg total) by mouth daily.  . SUMAtriptan (IMITREX) 100 MG tablet Take 1 tablet  BY MOUTH AT earliest onset of migraine. May repeat oncein 2 hours if headache persists or reOCcurs.  . topiramate (TOPAMAX) 15 MG capsule Take 2 capsules (30 mg total) by mouth 2 (two) times daily.  . traZODone (DESYREL) 50 MG tablet Take ONE-HALF (0.5) TO ONE (1) tablet (25-50 mg total) by mouth at bedtime as needed for sleep.  [name] venlafaxine (EFFEXOR) 75 MG tablet Take 1 tablet (75 mg total) by mouth 2 (two) times daily.  [name] zolpidem (AMBIEN) 5 MG tablet TAKE 1 TABLET BY MOUTH AT BEDTIME FOR SLEEP   No facility-administered encounter medications on file as of 12/30/2017.      Self-harm Behaviors Risk Assessment Self-harm risk factors:   Patient endorses recent thoughts of harming self: Have you  recently had any thoughts about harming yourself?: No  [place] Suicide Severity Rating Scale: No flowsheet data found. No flowsheet data found.   Danger to Others Risk Assessment Danger to others risk factors: Danger to Others Risk Factors: No risk factors noted Patient endorses recent thoughts of harming others: Notification required: No need or identified person  Dynamic Appraisal of Situational Aggression (DASA): No flowsheet data found.   Substance Use Assessment Patient recently consumed alcohol:    Alcohol Use Disorder Identification Test (AUDIT): No flowsheet data found. Patient recently used drugs:    Opioid Risk Assessment:    Goals, Interventions and Follow-up Plan Goals: Decrease depressive and anxiety associated with the loss of her aunt and mother  Interventions: [facility] Counselor will work with the patient to practice at least two anxiety management techniques.  [facility] Counselor will teach and support patient to learn and be able to verbalize at least 2 communication   strategies that can help decrease anxiety to the point where anxiety will occurs less than once per day.  [facility] Counselor will identify through journaling irrational extinguish irrational beliefs and conclusions that   contribute to anxiety   Follow-up Plan: Phone follow up   Summary:   Patient reports increased depression associated with pain in her hip and back.  Patient reports that her son is gone for the weekend but he will be back on Monday.  Patient has another family member that will be able to come and stay with her if she needs it.  Patient reports that she has not been able to get out and exercise due to the pain from her back.  Patient denies any side effects with her medication.   [name], LCAS-A

## 2018-01-04 ENCOUNTER — Other Ambulatory Visit: Payer: Self-pay | Admitting: Pediatrics

## 2018-01-04 DIAGNOSIS — F419 Anxiety disorder, unspecified: Secondary | ICD-10-CM

## 2018-01-09 ENCOUNTER — Encounter: Payer: Self-pay | Admitting: Pediatrics

## 2018-01-09 ENCOUNTER — Ambulatory Visit (INDEPENDENT_AMBULATORY_CARE_PROVIDER_SITE_OTHER): Payer: Medicare Other | Admitting: Pediatrics

## 2018-01-09 ENCOUNTER — Telehealth: Payer: Self-pay

## 2018-01-09 VITALS — BP 105/60 | HR 96 | Temp 97.2°F | Ht 67.0 in | Wt 208.0 lb

## 2018-01-09 DIAGNOSIS — G8929 Other chronic pain: Secondary | ICD-10-CM | POA: Diagnosis not present

## 2018-01-09 DIAGNOSIS — F329 Major depressive disorder, single episode, unspecified: Secondary | ICD-10-CM | POA: Diagnosis not present

## 2018-01-09 DIAGNOSIS — M545 Low back pain: Secondary | ICD-10-CM

## 2018-01-09 DIAGNOSIS — F32A Depression, unspecified: Secondary | ICD-10-CM

## 2018-01-09 NOTE — Progress Notes (Signed)
  Subjective:   Patient ID: [name], female    DOB: 1951-09-18, 67 y.o.   MRN: [id number] CC: Hip Pain and Leg Pain  HPI: [name] is a 67 y.o. female presenting for Hip Pain and Leg Pain  Started on prednisone, minimal relief from the pain. Also taking tramadol. Has follow up with ortho upcoming. Has lost the phone number to ortho. Reviewed records from ortho. Xray of lumbar spine with degenerative areas.   Mood has been better on slightly increased dose of venlafaxine.  No thoughts of self harm. No periods of confusion. Sometimes feels weak "from pain" in her legs, no recent falls. Sometimes pain goes from back into upper legs. No numbness or tingling in legs. No trouble emptying bladder/bowels.  Relevant past medical, surgical, family and social history reviewed. Allergies and medications reviewed and updated. Social History   Tobacco Use  Smoking Status Former Smoker  . Packs/day: 1.00  . Years: 30.00  . Pack years: 30.00  . Types: Cigarettes  . Last attempt to quit: 11/13/2012  . Years since quitting: 5.1  Smokeless Tobacco Never Used   ROS: Per HPI   Objective:    BP 105/60   Pulse 96   Temp (!) 97.2 F (36.2 C) (Oral)   Ht 5\' 7"  (1.702 m)   Wt 208 lb (94.3 kg)   BMI 32.58 kg/m   Wt Readings from Last 3 Encounters:  01/09/18 208 lb (94.3 kg)  11/29/17 209 lb 6.4 oz (95 kg)  10/10/17 206 lb (93.4 kg)    Gen: NAD, alert, cooperative with exam, NCAT EYES: EOMI, no conjunctival injection, or no icterus CV: NRRR, normal S1/S2, no murmur, distal pulses 2+ b/l Resp: CTABL, no wheezes, normal WOB Abd: +BS, soft, NTND. no guarding or organomegaly Ext: No edema, warm Neuro: Alert and oriented MSK: normal muscle bulk Psych: affect normal  Assessment & Plan:  [name] was seen today for hip pain and leg pain.  Diagnoses and all orders for this visit:  Chronic bilateral low back pain, with sciatica presence unspecified Has follow up upcoming with  ortho.   Depression, unspecified depression type Increased dose venlafaxine after last visit. Mood improved. Cont 75mg  BID  Follow up plan: 3 mo Assunta Found, MD [place]

## 2018-01-09 NOTE — Telephone Encounter (Signed)
[facility] - left message.  

## 2018-01-09 NOTE — Patient Instructions (Signed)
[place] [phone]

## 2018-01-10 ENCOUNTER — Telehealth: Payer: Self-pay

## 2018-01-10 NOTE — Telephone Encounter (Signed)
Patient needed number to [facility]. Gave number to patient and advised if she unable to get in touch with them to call our office back

## 2018-01-10 NOTE — Telephone Encounter (Signed)
Can you take care of this?

## 2018-01-10 NOTE — Telephone Encounter (Signed)
Needs phone # to Ortho you gave me yesterday  Think I wrote down wrong #

## 2018-01-11 ENCOUNTER — Telehealth: Payer: Self-pay

## 2018-01-11 ENCOUNTER — Ambulatory Visit: Payer: Medicare Other | Admitting: Audiology

## 2018-01-11 ENCOUNTER — Encounter: Payer: Self-pay | Admitting: Pediatrics

## 2018-01-11 NOTE — Telephone Encounter (Signed)
[facility] - left message.  

## 2018-01-16 ENCOUNTER — Other Ambulatory Visit: Payer: Self-pay | Admitting: Pediatrics

## 2018-01-16 DIAGNOSIS — G43709 Chronic migraine without aura, not intractable, without status migrainosus: Secondary | ICD-10-CM

## 2018-01-16 DIAGNOSIS — IMO0002 Reserved for concepts with insufficient information to code with codable children: Secondary | ICD-10-CM

## 2018-01-18 ENCOUNTER — Other Ambulatory Visit: Payer: Self-pay | Admitting: Orthopedic Surgery

## 2018-01-18 DIAGNOSIS — M5136 Other intervertebral disc degeneration, lumbar region: Secondary | ICD-10-CM

## 2018-01-30 ENCOUNTER — Other Ambulatory Visit: Payer: Self-pay | Admitting: Pediatrics

## 2018-01-30 DIAGNOSIS — G629 Polyneuropathy, unspecified: Secondary | ICD-10-CM

## 2018-01-31 ENCOUNTER — Telehealth: Payer: Self-pay

## 2018-01-31 DIAGNOSIS — F329 Major depressive disorder, single episode, unspecified: Secondary | ICD-10-CM

## 2018-01-31 DIAGNOSIS — F419 Anxiety disorder, unspecified: Secondary | ICD-10-CM

## 2018-01-31 DIAGNOSIS — F32A Depression, unspecified: Secondary | ICD-10-CM

## 2018-01-31 NOTE — Progress Notes (Signed)
[place] Telephone Follow-up  MRN: [id number] NAME: [name] Date: 01/31/18 Time of Assessment: 10:23 AM Call number: 4/6  Reason for call today: Reason for Contact: PHQ9-4 weeks  PHQ-9 Scores:  Depression screen [facility] 2/9 01/31/2018 01/09/2018 12/30/2017 11/30/2017 11/29/2017  Decreased Interest 3 0 3 2 0  Down, Depressed, Hopeless 3 0 3 2 0  PHQ - 2 Score 6 0 6 4 0  Altered sleeping 2 - 1 2 -  Tired, decreased energy 1 - 2 3 -  Change in appetite 0 - 0 0 -  Feeling bad or failure about yourself  1 - 2 0 -  Trouble concentrating 0 - 0 0 -  Moving slowly or fidgety/restless 0 - 0 0 -  Suicidal thoughts 0 - 0 0 -  PHQ-9 Score 10 - 11 9 -  Difficult doing work/chores - - - - -  Some recent data might be hidden     .[name] GAD 7 : Generalized Anxiety Score 01/31/2018 12/30/2017 11/30/2017 04/19/2017  Nervous, Anxious, on Edge 2 2 3 1   Control/stop worrying 2 3 3 1   Worry too much - different things 2 2 2 1   Trouble relaxing 2 3 3 1   Restless 3 1 2 1   Easily annoyed or irritable 0 1 2 1   Afraid - awful might happen 1 1 1 1   Total GAD 7 Score 12 13 16 7   Anxiety Difficulty - - - Somewhat difficult      Stress Current stressors: Current Stressors: Recent diagnosis of chronic illness or psychiatric disorder(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 busPIRone (BUSPAR) 5 MG tablet Take 1 tablet (5 mg total) by mouth 3 (three) times daily.  . calcitRIOL (ROCALTROL) 0.5 MCG capsule Take 0.5 mcg by mouth daily.   . cholecalciferol (VITAMIN D) 1000 UNITS tablet Take 1,000 Units by mouth daily.  . fluticasone (FLONASE) 50 MCG/ACT nasal spray Place 2 sprays into both nostrils daily.   . Fluticasone-Salmeterol (ADVAIR) 100-50 MCG/DOSE AEPB Inhale 1 puff into the lungs 2 (two) times daily.  [name] gabapentin (NEURONTIN) 600 MG tablet Take 1 tablet by mouth three times daily.  . hydrALAZINE (APRESOLINE) 50 MG tablet Take 1 tablet (50 mg total) by mouth 3 times daily.  . Ipratropium-Albuterol (COMBIVENT RESPIMAT) 20-100 MCG/ACT AERS respimat INHALE 2 PUFFS INTO THE LUNGS TWICE A DAY  . levETIRAcetam (KEPPRA) 500 MG tablet Take 1 tablet (500 mg total) by mouth 2 (two) times daily.  . pantoprazole (PROTONIX) 40 MG tablet Take 1 tablet (40 mg total) by mouth daily.  [name] topiramate (TOPAMAX) 15 MG capsule Take 2 capsules (30 mg total) by mouth 2 (two) times daily.  . traZODone (DESYREL) 50 MG tablet Take ONE-HALF (0.5) TO ONE (1) tablet (25-50 mg total) by mouth at bedtime as needed for sleep.  [name] venlafaxine (EFFEXOR) 75 MG tablet Take 1 tablet (75 mg total) by mouth 2 (two) times daily.  [name] zolpidem (AMBIEN) 5 MG tablet TAKE 1 TABLET BY MOUTH AT BEDTIME FOR SLEEP   No facility-administered encounter medications on file as of 01/31/2018.      Self-harm Behaviors Risk Assessment Self-harm risk factors:   Patient endorses recent thoughts of harming self: Have you recently had any thoughts about harming  yourself?: No  [place] Suicide Severity Rating Scale: No flowsheet data found. No flowsheet data found.   Danger to Others Risk Assessment Danger to others risk factors: Danger to Others Risk Factors: No risk factors noted Patient endorses recent thoughts of harming others: Notification required: No need or identified person  Dynamic Appraisal of Situational Aggression (DASA): No flowsheet data found.   Goals, Interventions and Follow-up Plan Goals: Decrease depression as evidenced by a reduction in the PHQ-9 and GAD7 score.  Interventions: Motivational interviewing; active listening; brief counseling   Follow-up Plan:  Contacting PCP and Dr. Modesta Messing regarding medication  recommendations..  Having family come over and spend time with her because she is not very mobile.  Journal ing her feelings  Summary:  Patient reports that her depression is about the same.  Patient reports that she does not think that her depression medicine is working for her.  Patient did state that her depression gets worse when she is experiencing issues with her hip and back.  Patient has  an upcomming appt with her hip and back on Monday.  Patient reports that her quality of life is very poor because she is always in pain.    [name], LCAS-A

## 2018-01-31 NOTE — Telephone Encounter (Signed)
[facility] - Writer left a voicemail message on 01-09-2018; 01-11-2018 and 01-25-2018.  Writer routed this information

## 2018-02-02 ENCOUNTER — Telehealth: Payer: Self-pay | Admitting: Neurology

## 2018-02-02 ENCOUNTER — Encounter: Payer: Self-pay | Admitting: *Deleted

## 2018-02-02 MED ORDER — ONABOTULINUMTOXINA 200 UNITS IJ SOLR: 200.0000 [IU] | INTRAMUSCULAR | 0 refills | Status: DC

## 2018-02-02 NOTE — Telephone Encounter (Signed)
°  1.     Which medications need to be refilled? (please list name of each medication and dose if know)Botox   2.     Which pharmacy/location (including street and city if local pharmacy) is medication to be sent to? [place] [phone]  3.     Do they need a 30 or 90 day supply?

## 2018-02-02 NOTE — Telephone Encounter (Signed)
Refilled Botox to [facility]

## 2018-02-05 ENCOUNTER — Ambulatory Visit
Admission: RE | Admit: 2018-02-05 | Discharge: 2018-02-05 | Disposition: A | Discharge status: Home / Self Care | Payer: Medicare Other | Source: Ambulatory Visit | Attending: Orthopedic Surgery | Admitting: Orthopedic Surgery

## 2018-02-05 DIAGNOSIS — M5136 Other intervertebral disc degeneration, lumbar region: Secondary | ICD-10-CM

## 2018-02-05 MED ORDER — ONDANSETRON HCL 4 MG/2ML IJ SOLN
4.0000 mg | Freq: Once | INTRAMUSCULAR | Status: AC
  Administered 2018-02-05: 4 mg via INTRAMUSCULAR

## 2018-02-05 MED ORDER — DIAZEPAM 5 MG PO TABS
5.0000 mg | ORAL_TABLET | Freq: Once | ORAL | Status: AC
  Administered 2018-02-05: 5 mg via ORAL

## 2018-02-05 MED ORDER — MEPERIDINE HCL 100 MG/ML IJ SOLN
65.0000 mg | Freq: Once | INTRAMUSCULAR | Status: AC
  Administered 2018-02-05: 65 mg via INTRAMUSCULAR

## 2018-02-05 MED ORDER — IOPAMIDOL (ISOVUE-M 200) INJECTION 41%
15.0000 mL | Freq: Once | INTRAMUSCULAR | Status: AC
  Administered 2018-02-05: 15 mL via INTRATHECAL

## 2018-02-05 NOTE — Discharge Instructions (Signed)
Myelogram Discharge Instructions  1. Go home and rest quietly for the next 24 hours.  It is important to lie flat for the next 24 hours.  Get up only to go to the restroom.  You may lie in the bed or on a couch on your back, your stomach, your left side or your right side.  You may have one pillow under your head.  You may have pillows between your knees while you are on your side or under your knees while you are on your back.  2. DO NOT drive today.  Recline the seat as far back as it will go, while still wearing your seat belt, on the way home.  3. You may get up to go to the bathroom as needed.  You may sit up for 10 minutes to eat.  You may resume your normal diet and medications unless otherwise indicated.  Drink lots of extra fluids today and tomorrow.  4. The incidence of headache, nausea, or vomiting is about 5% (one in 20 patients).  If you develop a headache, lie flat and drink plenty of fluids until the headache goes away.  Caffeinated beverages may be helpful.  If you develop severe nausea and vomiting or a headache that does not go away with flat bed rest, call [phone].  5. You may resume normal activities after your 24 hours of bed rest is over; however, do not exert yourself strongly or do any heavy lifting tomorrow. If when you get up you have a headache when standing, go back to bed and force fluids for another 24 hours.  6. Call your physician for a follow-up appointment.  The results of your myelogram will be sent directly to your physician by the following day.  7. If you have any questions or if complications develop after you arrive home, please call [phone].  Discharge instructions have been explained to the patient.  The patient, or the person responsible for the patient, fully understands these instructions.  YOU MAY RESTART YOUR EFFEXOR, TRAZODONE AND BUSPAR TOMORROW 02/06/2018 AT 9:30AM.

## 2018-02-06 ENCOUNTER — Telehealth: Payer: Self-pay

## 2018-02-06 DIAGNOSIS — F419 Anxiety disorder, unspecified: Secondary | ICD-10-CM

## 2018-02-06 DIAGNOSIS — F329 Major depressive disorder, single episode, unspecified: Secondary | ICD-10-CM

## 2018-02-06 DIAGNOSIS — F32A Depression, unspecified: Secondary | ICD-10-CM

## 2018-02-06 NOTE — BH Specialist Note (Signed)
[place] Telephone Follow-up  MRN: [id number] NAME: [name] Date: 02/06/18  Start time: 1pm -1:30pm Total time: 30 minutes Call number: 3/6  Reason for call today: Reason for Contact: PHQ9-4 weeks  PHQ-9 Scores:  Depression screen [name] 2/9 02/06/2018 01/31/2018 01/09/2018 12/30/2017 11/30/2017  Decreased Interest 3 3 0 3 2  Down, Depressed, Hopeless 3 3 0 3 2  PHQ - 2 Score 6 6 0 6 4  Altered sleeping 2 2 - 1 2  Tired, decreased energy 1 1 - 2 3  Change in appetite 2 0 - 0 0  Feeling bad or failure about yourself  2 1 - 2 0  Trouble concentrating 1 0 - 0 0  Moving slowly or fidgety/restless 0 0 - 0 0  Suicidal thoughts 0 0 - 0 0  PHQ-9 Score 14 10 - 11 9  Difficult doing work/chores - - - - -  Some recent data might be hidden   GAD-7 Scores:  GAD 7 : Generalized Anxiety Score 02/06/2018 01/31/2018 12/30/2017 11/30/2017  Nervous, Anxious, on Edge 3 2 2 3   Control/stop worrying 3 2 3 3   Worry too much - different things 3 2 2 2   Trouble relaxing 2 2 3 3   Restless 1 3 1 2   Easily annoyed or irritable 2 0 1 2  Afraid - awful might happen 1 1 1 1   Total GAD 7 Score 15 12 13 16   Anxiety Difficulty - - - -    Stress Current stressors: Current Stressors: Other (Comment), Recent diagnosis of chronic illness or psychiatric disorder Sleep: Sleep: Difficulty falling asleep, Difficulty staying asleep Appetite: Appetite: Decreased, Loss of appetite Coping ability: Coping ability: Overwhelmed Patient taking medications as prescribed: Patient taking medications as prescribed: Yes  Current medications:  Outpatient Encounter Medications as of 02/06/2018  Medication Sig  . Botulinum Toxin Type A (BOTOX) 200 units SOLR Inject 200 Units as directed every 3 (three) months.  . busPIRone (BUSPAR) 5 MG tablet Take 1 tablet (5 mg total) by mouth 3 (three) times daily.  . calcitRIOL (ROCALTROL) 0.5 MCG capsule Take 0.5 mcg by mouth daily.   . cholecalciferol (VITAMIN D) 1000 UNITS  tablet Take 1,000 Units by mouth daily.  . fluticasone (FLONASE) 50 MCG/ACT nasal spray Place 2 sprays into both nostrils daily.  . Fluticasone-Salmeterol (ADVAIR) 100-50 MCG/DOSE AEPB Inhale 1 puff into the lungs 2 (two) times daily.  [name] gabapentin (NEURONTIN) 600 MG tablet Take 1 tablet by mouth three times daily.  . hydrALAZINE (APRESOLINE) 50 MG tablet Take 1 tablet (50 mg total) by mouth 3 times daily.  . Ipratropium-Albuterol (COMBIVENT RESPIMAT) 20-100 MCG/ACT AERS respimat INHALE 2 PUFFS INTO THE LUNGS TWICE A DAY  . levETIRAcetam (KEPPRA) 500 MG tablet Take 1 tablet (500 mg total) by mouth 2 (two) times daily.  . pantoprazole (PROTONIX) 40 MG tablet Take 1 tablet (40 mg total) by mouth daily.  [name] topiramate (TOPAMAX) 15 MG capsule Take 2 capsules (30 mg total) by mouth 2 (two) times daily.  . traZODone (DESYREL) 50 MG tablet Take ONE-HALF (0.5) TO ONE (1) tablet (25-50 mg total) by mouth at bedtime as needed for sleep.  [name] venlafaxine (EFFEXOR) 75 MG tablet Take 1 tablet (75 mg total) by mouth 2 (two) times daily.  [name] zolpidem (AMBIEN) 5 MG tablet TAKE 1 TABLET BY MOUTH AT BEDTIME FOR SLEEP   No facility-administered encounter medications on file as of 02/06/2018.      Self-harm Behaviors Risk  Assessment Self-harm risk factors:   Patient endorses recent thoughts of harming self: Have you recently had any thoughts about harming yourself?: No  [place] Suicide Severity Rating Scale: No flowsheet data found. No flowsheet data found.   Danger to Others Risk Assessment Danger to others risk factors: Danger to Others Risk Factors: No risk factors noted Patient endorses recent thoughts of harming others: Notification required: No need or identified person  Dynamic Appraisal of Situational Aggression (DASA): No flowsheet data found.   Substance Use Assessment Patient recently consumed alcohol:    Alcohol Use Disorder Identification Test (AUDIT): No flowsheet data found. Patient recently  used drugs:    Opioid Risk Assessment:    Goals, Interventions and Follow-up Plan Goals: Increase healthy adjustment to current life circumstances Interventions: Motivational Interviewing, Supportive Counseling and Medication Monitoring Follow-up Plan: Consult with Dr. Modesta Messing and PCP   Summary:  [name] is experiencing loss of pain in her back. Patient reports that the pain is causing her, "nerves to be a lot worst".  [name] does not like to take pain pills.  [name] reports that she cannot do housework at home and she gets frustrated and anxious.  [name] reports that the medication that she takes for her, "nerves" is not helping at all.     [name], LCAS-A

## 2018-02-07 ENCOUNTER — Encounter: Payer: Self-pay | Admitting: *Deleted

## 2018-02-07 ENCOUNTER — Encounter (HOSPITAL_COMMUNITY): Payer: Self-pay | Admitting: Psychiatry

## 2018-02-07 NOTE — Progress Notes (Signed)
[facility] Psychiatric Consultant Case Review   Summary [name] is a 67 y.o. year old female with history of depression, CKD, MGUS, seizure. Patient reports worsening anxiety in the setting of pain. Effexor was uptitrated since the last case review.   Functional Impairment: Psychosocial factors: pain, family death (aunt in 06-15-17)   Current Medications Current Outpatient Medications on File Prior to Visit  Medication Sig Dispense Refill  . Botulinum Toxin Type A (BOTOX) 200 units SOLR Inject 200 Units as directed every 3 (three) months. 1 each 0  . busPIRone (BUSPAR) 5 MG tablet Take 1 tablet (5 mg total) by mouth 3 (three) times daily. 90 tablet 2  . calcitRIOL (ROCALTROL) 0.5 MCG capsule Take 0.5 mcg by mouth daily.     . cholecalciferol (VITAMIN D) 1000 UNITS tablet Take 1,000 Units by mouth daily.    . fluticasone (FLONASE) 50 MCG/ACT nasal spray Place 2 sprays into both nostrils daily. 16 g 2  . Fluticasone-Salmeterol (ADVAIR) 100-50 MCG/DOSE AEPB Inhale 1 puff into the lungs 2 (two) times daily. 1 each 3  . gabapentin (NEURONTIN) 600 MG tablet Take 1 tablet by mouth three times daily. 90 tablet 0  . hydrALAZINE (APRESOLINE) 50 MG tablet Take 1 tablet (50 mg total) by mouth 3 times daily. 90 tablet 0  . Ipratropium-Albuterol (COMBIVENT RESPIMAT) 20-100 MCG/ACT AERS respimat INHALE 2 PUFFS INTO THE LUNGS TWICE A DAY 4 g 4  . levETIRAcetam (KEPPRA) 500 MG tablet Take 1 tablet (500 mg total) by mouth 2 (two) times daily. 60 tablet 5  . pantoprazole (PROTONIX) 40 MG tablet Take 1 tablet (40 mg total) by mouth daily. 30 tablet 5  . topiramate (TOPAMAX) 15 MG capsule Take 2 capsules (30 mg total) by mouth 2 (two) times daily. 120 capsule 0  . traZODone (DESYREL) 50 MG tablet Take ONE-HALF (0.5) TO ONE (1) tablet (25-50 mg total) by mouth at bedtime as needed for sleep. 30 tablet 2  . venlafaxine (EFFEXOR) 75 MG tablet Take 1 tablet (75 mg total)  by mouth 2 (two) times daily. 60 tablet 2  . zolpidem (AMBIEN) 5 MG tablet TAKE 1 TABLET BY MOUTH AT BEDTIME FOR SLEEP 10 tablet 2   No current facility-administered medications on file prior to visit.      Past psychiatry history I have reviewed the patient's psychiatry history in detail and updated the patient record. Outpatient: denies Psychiatry admission: [facility] in 2005  Previous suicide attempt: denies recent attempt Past trials of medication: sertraline, duloxetine, quetiapine History of violence:     Current measures Depression screen [facility] 2/9 02/06/2018 01/31/2018 01/09/2018 12/30/2017 11/30/2017  Decreased Interest 3 3 0 3 2  Down, Depressed, Hopeless 3 3 0 3 2  PHQ - 2 Score 6 6 0 6 4  Altered sleeping 2 2 - 1 2  Tired, decreased energy 1 1 - 2 3  Change in appetite 2 0 - 0 0  Feeling bad or failure about yourself  2 1 - 2 0  Trouble concentrating 1 0 - 0 0  Moving slowly or fidgety/restless 0 0 - 0 0  Suicidal thoughts 0 0 - 0 0  PHQ-9 Score 14 10 - 11 9  Difficult doing work/chores - - - - -  Some recent data might be hidden   GAD 7 : Generalized Anxiety Score 02/06/2018 01/31/2018 12/30/2017 11/30/2017  Nervous, Anxious, on Edge 3 2 2 3   Control/stop worrying 3 2  3 3  Worry too much - different things 3 2 2 2   Trouble relaxing 2 2 3 3   Restless 1 3 1 2   Easily annoyed or irritable 2 0 1 2  Afraid - awful might happen 1 1 1 1   Total GAD 7 Score 15 12 13 16   Anxiety Difficulty - - - -    Goals (patient centered) Increase healthy adjustment to current life circumstances  Assessment/Provisional Diagnosis # MDD  # Unspecified anxiety disorder Worsening anxiety in the setting of pain. It is likely situational and she will greatly benefit from CBT/mindfulness to target pain/anxiety. Would recommend to continue to work on her pain management as it appears to be the main source of anxiety. The following medication adjustment to target anxiety can be  considered in the future. Noted that careful dose adjustment is necessary with venlafaxine in patient with CKD (Ccr 38, Cre 2.16 08/2017). Regular monitoring of blood pressure is recommended. Other antidepressants such as sertraline or fluoxetine does not require dose adjustment; consider switching to other SSRI (except Paxil) if patient is amenable and/or there is limited benefit from Effexor. Will continue buspar for anxiety.   Recommendation - Continue to work on pain management - Consider uptitration of Effexor IR 100 mg BID in the future - Continue buspar 5 mg TID - Consider recheck TSH  - On trazodone 25-50 mg qhs prn for insomnia, Ambien 5 mg qhsprn for insomnia  Thank you for your consult. We will continue to follow the patient. Please contact [place] [facility]  for any questions or concerns.   The above treatment considerations and suggestions are based on consultation with the [facility] specialist and/or PCP and a review of information available in the shared registry and the patient's Parchment Record (EHR). I have not personally examined the patient. All recommendations should be implemented with consideration of the patient's relevant prior history and current clinical status. Please feel free to call me with any questions about the care of this patient.

## 2018-02-07 NOTE — Telephone Encounter (Signed)
This encounter was created in error - please disregard.

## 2018-02-15 ENCOUNTER — Telehealth: Payer: Self-pay

## 2018-02-15 ENCOUNTER — Telehealth: Payer: Self-pay | Admitting: *Deleted

## 2018-02-15 DIAGNOSIS — F419 Anxiety disorder, unspecified: Secondary | ICD-10-CM

## 2018-02-15 DIAGNOSIS — F32A Depression, unspecified: Secondary | ICD-10-CM

## 2018-02-15 DIAGNOSIS — F329 Major depressive disorder, single episode, unspecified: Secondary | ICD-10-CM

## 2018-02-15 NOTE — Telephone Encounter (Signed)
Please schedule appt to see me. 30 min. Also needs pre-op eval.

## 2018-02-15 NOTE — Telephone Encounter (Signed)
TC from [facility] Pt is frustrated and anxious from lots of pain in her pain Notes in their summary from 02/06/18 Please advise and contact patient

## 2018-02-15 NOTE — BH Specialist Note (Signed)
[place] Telephone Follow-up  MRN: [id number] NAME: [name] Date: 02/15/18  Start time: 11:30 - 12:00 Total time: 30 minutes Call number: 5/6  Reason for call today: Reason for Contact: Other (Comment)  PHQ-9 Scores:  Depression screen [facility] 2/9 02/15/2018 02/06/2018 01/31/2018 01/09/2018 12/30/2017  Decreased Interest 2 3 3  0 3  Down, Depressed, Hopeless 3 3 3  0 3  PHQ - 2 Score 5 6 6  0 6  Altered sleeping 0 2 2 - 1  Tired, decreased energy 1 1 1  - 2  Change in appetite 0 2 0 - 0  Feeling bad or failure about yourself  3 2 1  - 2  Trouble concentrating 1 1 0 - 0  Moving slowly or fidgety/restless 0 0 0 - 0  Suicidal thoughts 0 0 0 - 0  PHQ-9 Score 10 14 10  - 11  Difficult doing work/chores - - - - -  Some recent data might be hidden   GAD-7 Scores:  GAD 7 : Generalized Anxiety Score 02/15/2018 02/06/2018 01/31/2018 12/30/2017  Nervous, Anxious, on Edge 3 3 2 2   Control/stop worrying 3 3 2 3   Worry too much - different things 3 3 2 2   Trouble relaxing 1 2 2 3   Restless 0 1 3 1   Easily annoyed or irritable 0 2 0 1  Afraid - awful might happen 1 1 1 1   Total GAD 7 Score 11 15 12 13   Anxiety Difficulty - - - -    Stress Current stressors: Current Stressors: Other (Comment)(Increased anxiety and pain in her back.) Sleep: Sleep: No problems Appetite: Appetite: No problems Coping ability: Coping ability: Overwhelmed Patient taking medications as prescribed: Patient taking medications as prescribed: Yes  Current medications:  Outpatient Encounter Medications as of 02/15/2018  Medication Sig  . Botulinum Toxin Type A (BOTOX) 200 units SOLR Inject 200 Units as directed every 3 (three) months.  . busPIRone (BUSPAR) 5 MG tablet Take 1 tablet (5 mg total) by mouth 3 (three) times daily.  . calcitRIOL (ROCALTROL) 0.5 MCG capsule Take 0.5 mcg by mouth daily.   . cholecalciferol (VITAMIN D) 1000 UNITS tablet Take 1,000 Units by mouth daily.  . fluticasone (FLONASE) 50  MCG/ACT nasal spray Place 2 sprays into both nostrils daily.  . Fluticasone-Salmeterol (ADVAIR) 100-50 MCG/DOSE AEPB Inhale 1 puff into the lungs 2 (two) times daily.  [name] gabapentin (NEURONTIN) 600 MG tablet Take 1 tablet by mouth three times daily.  . hydrALAZINE (APRESOLINE) 50 MG tablet Take 1 tablet (50 mg total) by mouth 3 times daily.  . Ipratropium-Albuterol (COMBIVENT RESPIMAT) 20-100 MCG/ACT AERS respimat INHALE 2 PUFFS INTO THE LUNGS TWICE A DAY  . levETIRAcetam (KEPPRA) 500 MG tablet Take 1 tablet (500 mg total) by mouth 2 (two) times daily.  . pantoprazole (PROTONIX) 40 MG tablet Take 1 tablet (40 mg total) by mouth daily.  [name] topiramate (TOPAMAX) 15 MG capsule Take 2 capsules (30 mg total) by mouth 2 (two) times daily.  . traZODone (DESYREL) 50 MG tablet Take ONE-HALF (0.5) TO ONE (1) tablet (25-50 mg total) by mouth at bedtime as needed for sleep.  [name] venlafaxine (EFFEXOR) 75 MG tablet Take 1 tablet (75 mg total) by mouth 2 (two) times daily.  [name] zolpidem (AMBIEN) 5 MG tablet TAKE 1 TABLET BY MOUTH AT BEDTIME FOR SLEEP   No facility-administered encounter medications on file as of 02/15/2018.      Self-harm Behaviors Risk Assessment Self-harm risk factors:   Patient  endorses recent thoughts of harming self: Have you recently had any thoughts about harming yourself?: No  [place] Suicide Severity Rating Scale: No flowsheet data found. No flowsheet data found.   Danger to Others Risk Assessment Danger to others risk factors: Danger to Others Risk Factors: No risk factors noted Patient endorses recent thoughts of harming others: Notification required: No need or identified person  Dynamic Appraisal of Situational Aggression (DASA): No flowsheet data found.   Substance Use Assessment Patient recently consumed alcohol:    Alcohol Use Disorder Identification Test (AUDIT): No flowsheet data found. Patient recently used drugs:    Opioid Risk Assessment:    Goals, Interventions  and Follow-up Plan Goals: Increase healthy adjustment to current life circumstances Interventions: Motivational Interviewing, Supportive Counseling, Medication Monitoring and Sleep Hygiene Follow-up Plan: PCP will be contacting the patient regaridng her current medication.   Summary:  [name] is experiencing loss of pain in her back. Patient reports that the pain is causing her, "nerves to be a lot worst".  [name] does not like to take pain pills.  [name] reports that she cannot do housework at home and she gets frustrated and anxious.  [name] reports that the medication that she takes for her, "nerves" is not helping at all. Writer contacted the PCP and informed them of the increased pain.    [name], LCAS-A

## 2018-02-16 ENCOUNTER — Ambulatory Visit: Payer: Self-pay | Admitting: Neurology

## 2018-02-16 NOTE — Telephone Encounter (Signed)
lmtcb

## 2018-02-20 ENCOUNTER — Other Ambulatory Visit: Payer: Self-pay | Admitting: Pediatrics

## 2018-02-20 DIAGNOSIS — R569 Unspecified convulsions: Secondary | ICD-10-CM

## 2018-02-21 ENCOUNTER — Telehealth (HOSPITAL_COMMUNITY): Payer: Self-pay

## 2018-02-21 ENCOUNTER — Ambulatory Visit (INDEPENDENT_AMBULATORY_CARE_PROVIDER_SITE_OTHER): Payer: Medicare Other | Admitting: Pediatrics

## 2018-02-21 ENCOUNTER — Telehealth: Payer: Self-pay

## 2018-02-21 ENCOUNTER — Encounter: Payer: Self-pay | Admitting: Pediatrics

## 2018-02-21 VITALS — BP 120/74 | HR 82 | Temp 97.1°F | Ht 67.0 in | Wt 210.8 lb

## 2018-02-21 DIAGNOSIS — F329 Major depressive disorder, single episode, unspecified: Secondary | ICD-10-CM | POA: Diagnosis not present

## 2018-02-21 DIAGNOSIS — G4700 Insomnia, unspecified: Secondary | ICD-10-CM | POA: Diagnosis not present

## 2018-02-21 DIAGNOSIS — F32A Depression, unspecified: Secondary | ICD-10-CM

## 2018-02-21 DIAGNOSIS — Z01818 Encounter for other preprocedural examination: Secondary | ICD-10-CM | POA: Diagnosis not present

## 2018-02-21 DIAGNOSIS — G629 Polyneuropathy, unspecified: Secondary | ICD-10-CM | POA: Diagnosis not present

## 2018-02-21 MED ORDER — ZOLPIDEM TARTRATE 5 MG PO TABS: 5.0000 mg | ORAL_TABLET | Freq: Every evening | ORAL | 1 refills | Status: DC | PRN

## 2018-02-21 MED ORDER — VENLAFAXINE HCL 100 MG PO TABS: 100.0000 mg | ORAL_TABLET | Freq: Two times a day (BID) | ORAL | 1 refills | Status: DC

## 2018-02-21 MED ORDER — GABAPENTIN 600 MG PO TABS: 600.0000 mg | ORAL_TABLET | Freq: Two times a day (BID) | ORAL | 1 refills | Status: DC

## 2018-02-21 NOTE — Progress Notes (Signed)
07/06/2017- noted in Epic-EKG  08/10/2016- noted in Epic-ECHO

## 2018-02-21 NOTE — Patient Instructions (Addendum)
[name]  02/21/2018   Your procedure is scheduled on: Wednesday 03/07/2018  Report to [facility] Main  Entrance   Report to admitting at  0900 AM    Call this number if you have problems the morning of surgery [phone]    Remember: Do not eat food or drink liquids :After Midnight.     Take these medicines the morning of surgery with A SIP OF WATER: Buspirone  (Buspar), Gabapentin (Neurontin),Hydralazine (Apresoline),Topiramate (Topamax), Levetiracetam (Keppra),Venlafaxine (Effexor), use Combivent Respimat inhaler and Advair inhaler and bring them with you to the hospital the morning of surgery.                                You may not have any metal on your body including hair pins and              piercings  Do not wear jewelry, make-up, lotions, powders or perfumes, deodorant             Do not wear nail polish.  Do not shave  48 hours prior to surgery.              Men may shave face and neck.   Do not bring valuables to the hospital. [place].  Contacts, dentures or bridgework may not be worn into surgery.  Leave suitcase in the car. After surgery it may be brought to your room.                  Please read over the following fact sheets you were given: _____________________________________________________________________             [facility] - Preparing for Surgery Before surgery, you can play an important role.  Because skin is not sterile, your skin needs to be as free of germs as possible.  You can reduce the number of germs on your skin by washing with CHG (chlorahexidine gluconate) soap before surgery.  CHG is an antiseptic cleaner which kills germs and bonds with the skin to continue killing germs even after washing. Please DO NOT use if you have an allergy to CHG or antibacterial soaps.  If your skin becomes reddened/irritated stop using the CHG and inform your nurse when  you arrive at Short Stay. Do not shave (including legs and underarms) for at least 48 hours prior to the first CHG shower.  You may shave your face/neck. Please follow these instructions carefully:  1.  Shower with CHG Soap the night before surgery and the  morning of Surgery.  2.  If you choose to wash your hair, wash your hair first as usual with your  normal  shampoo.  3.  After you shampoo, rinse your hair and body thoroughly to remove the  shampoo.                           4.  Use CHG as you would any other liquid soap.  You can apply chg directly  to the skin and wash                       Gently with a scrungie or clean washcloth.  5.  Apply the CHG Soap to your body ONLY FROM THE NECK DOWN.   Do not use on face/ open                           Wound or open sores. Avoid contact with eyes, ears mouth and genitals (private parts).                       Wash face,  Genitals (private parts) with your normal soap.             6.  Wash thoroughly, paying special attention to the area where your surgery  will be performed.  7.  Thoroughly rinse your body with warm water from the neck down.  8.  DO NOT shower/wash with your normal soap after using and rinsing off  the CHG Soap.                9.  Pat yourself dry with a clean towel.            10.  Wear clean pajamas.            11.  Place clean sheets on your bed the night of your first shower and do not  sleep with pets. Day of Surgery : Do not apply any lotions/deodorants the morning of surgery.  Please wear clean clothes to the hospital/surgery center.  FAILURE TO FOLLOW THESE INSTRUCTIONS MAY RESULT IN THE CANCELLATION OF YOUR SURGERY PATIENT SIGNATURE_________________________________  NURSE SIGNATURE__________________________________  ________________________________________________________________________   Adam Phenix  An incentive spirometer is a tool that can help keep your lungs clear and active. This tool measures  how well you are filling your lungs with each breath. Taking long deep breaths may help reverse or decrease the chance of developing breathing (pulmonary) problems (especially infection) following:  A long period of time when you are unable to move or be active. BEFORE THE PROCEDURE   If the spirometer includes an indicator to show your best effort, your nurse or respiratory therapist will set it to a desired goal.  If possible, sit up straight or lean slightly forward. Try not to slouch.  Hold the incentive spirometer in an upright position. INSTRUCTIONS FOR USE  1. Sit on the edge of your bed if possible, or sit up as far as you can in bed or on a chair. 2. Hold the incentive spirometer in an upright position. 3. Breathe out normally. 4. Place the mouthpiece in your mouth and seal your lips tightly around it. 5. Breathe in slowly and as deeply as possible, raising the piston or the ball toward the top of the column. 6. Hold your breath for 3-5 seconds or for as long as possible. Allow the piston or ball to fall to the bottom of the column. 7. Remove the mouthpiece from your mouth and breathe out normally. 8. Rest for a few seconds and repeat Steps 1 through 7 at least 10 times every 1-2 hours when you are awake. Take your time and take a few normal breaths between deep breaths. 9. The spirometer may include an indicator to show your best effort. Use the indicator as a goal to work toward during each repetition. 10. After each set of 10 deep breaths, practice coughing to be sure your lungs are clear. If you have an incision (the cut made at the time of surgery), support your incision when coughing by placing a  pillow or rolled up towels firmly against it. Once you are able to get out of bed, walk around indoors and cough well. You may stop using the incentive spirometer when instructed by your caregiver.  RISKS AND COMPLICATIONS  Take your time so you do not get dizzy or light-headed.  If  you are in pain, you may need to take or ask for pain medication before doing incentive spirometry. It is harder to take a deep breath if you are having pain. AFTER USE  Rest and breathe slowly and easily.  It can be helpful to keep track of a log of your progress. Your caregiver can provide you with a simple table to help with this. If you are using the spirometer at home, follow these instructions: [place] IF:   You are having difficultly using the spirometer.  You have trouble using the spirometer as often as instructed.  Your pain medication is not giving enough relief while using the spirometer.  You develop fever of 100.5 F (38.1 C) or higher. SEEK IMMEDIATE MEDICAL CARE IF:   You cough up bloody sputum that had not been present before.  You develop fever of 102 F (38.9 C) or greater.  You develop worsening pain at or near the incision site. MAKE SURE YOU:   Understand these instructions.  Will watch your condition.  Will get help right away if you are not doing well or get worse. Document Released: 03/27/2007 Document Revised: 02/06/2012 Document Reviewed: 05/28/2007 [employer] Patient Information 2014 [employer], [place].   ________________________________________________________________________  WHAT IS A BLOOD TRANSFUSION? Blood Transfusion Information  A transfusion is the replacement of blood or some of its parts. Blood is made up of multiple cells which provide different functions.  Red blood cells carry oxygen and are used for blood loss replacement.  White blood cells fight against infection.  Platelets control bleeding.  Plasma helps clot blood.  Other blood products are available for specialized needs, such as hemophilia or other clotting disorders. BEFORE THE TRANSFUSION  Who gives blood for transfusions?   Healthy volunteers who are fully evaluated to make sure their blood is safe. This is blood bank blood. Transfusion therapy is the  safest it has ever been in the practice of medicine. Before blood is taken from a donor, a complete history is taken to make sure that person has no history of diseases nor engages in risky social behavior (examples are intravenous drug use or sexual activity with multiple partners). The donor's travel history is screened to minimize risk of transmitting infections, such as malaria. The donated blood is tested for signs of infectious diseases, such as HIV and hepatitis. The blood is then tested to be sure it is compatible with you in order to minimize the chance of a transfusion reaction. If you or a relative donates blood, this is often done in anticipation of surgery and is not appropriate for emergency situations. It takes many days to process the donated blood. RISKS AND COMPLICATIONS Although transfusion therapy is very safe and saves many lives, the main dangers of transfusion include:   Getting an infectious disease.  Developing a transfusion reaction. This is an allergic reaction to something in the blood you were given. Every precaution is taken to prevent this. The decision to have a blood transfusion has been considered carefully by your caregiver before blood is given. Blood is not given unless the benefits outweigh the risks. AFTER THE TRANSFUSION  Right after receiving a blood transfusion, you  will usually feel much better and more energetic. This is especially true if your red blood cells have gotten low (anemic). The transfusion raises the level of the red blood cells which carry oxygen, and this usually causes an energy increase.  The nurse administering the transfusion will monitor you carefully for complications. HOME CARE INSTRUCTIONS  No special instructions are needed after a transfusion. You may find your energy is better. Speak with your caregiver about any limitations on activity for underlying diseases you may have. SEEK MEDICAL CARE IF:   Your condition is not improving  after your transfusion.  You develop redness or irritation at the intravenous (IV) site. SEEK IMMEDIATE MEDICAL CARE IF:  Any of the following symptoms occur over the next 12 hours:  Shaking chills.  You have a temperature by mouth above 102 F (38.9 C), not controlled by medicine.  Chest, back, or muscle pain.  People around you feel you are not acting correctly or are confused.  Shortness of breath or difficulty breathing.  Dizziness and fainting.  You get a rash or develop hives.  You have a decrease in urine output.  Your urine turns a dark color or changes to pink, red, or brown. Any of the following symptoms occur over the next 10 days:  You have a temperature by mouth above 102 F (38.9 C), not controlled by medicine.  Shortness of breath.  Weakness after normal activity.  The white part of the eye turns yellow (jaundice).  You have a decrease in the amount of urine or are urinating less often.  Your urine turns a dark color or changes to pink, red, or brown. Document Released: 11/11/2000 Document Revised: 02/06/2012 Document Reviewed: 06/30/2008 [facility] Patient Information 2014 [place], [place].  _______________________________________________________________________

## 2018-02-21 NOTE — Telephone Encounter (Signed)
Has apt today with Dr. Evette Doffing.

## 2018-02-21 NOTE — Telephone Encounter (Signed)
Tim with [employer] called to confirm Pt has had a reduction in frequency of headaches since starting Botox. I advsd yes.

## 2018-02-21 NOTE — Progress Notes (Signed)
Subjective:   Patient ID: [name], female    DOB: Nov 09, 1951, 67 y.o.   MRN: [id number] CC: Pre-op Exam  HPI: [name] is a 67 y.o. female presenting for Pre-op Exam  Upcoming lower back surgery planned for spinal stenosis.  Has been feeling well but is very limited by pain in back for past few weeks.  COPD: using combivent inhaler a couple times a week when she has to walk a distance.  Was able to walk for 10 minutes last fall, also doing all of her house work without SOB or chest pain. Limited by pain since then. Not regularly exerting herself. Now has a hard time doing house work regularly due to pain, has to stop to rest.   CKD: follows with nephrology through [facility] seen there two weeks ago. Cr was 1.81, about baseline.  Chronic diastolic CHF: normal EF, grade II dysfunction on ECHO 07/2016. No swelling in LE. No SOB with exertion as above but exertion limited past couple of months due to pain.  Insomnia: trying to wean off of ambien, has been taking nightly recently because helps her sleep despite discomfort in back. No confusion, altered mentation.  Relevant past medical, surgical, family and social history reviewed. Allergies and medications reviewed and updated. Social History   Tobacco Use  Smoking Status Former Smoker  . Packs/day: 1.00  . Years: 30.00  . Pack years: 30.00  . Types: Cigarettes  . Last attempt to quit: 11/13/2012  . Years since quitting: 5.2  Smokeless Tobacco Never Used   ROS: Per HPI   Objective:    BP 120/74   Pulse 82   Temp (!) 97.1 F (36.2 C) (Oral)   Ht 5\' 7"  (1.702 m)   Wt 210 lb 12.8 oz (95.6 kg)   BMI 33.02 kg/m   Wt Readings from Last 3 Encounters:  02/21/18 210 lb 12.8 oz (95.6 kg)  01/09/18 208 lb (94.3 kg)  11/29/17 209 lb 6.4 oz (95 kg)    Gen: NAD, alert, cooperative with exam, NCAT EYES: EOMI, no conjunctival injection, or no icterus CV: NRRR, normal S1/S2, no murmur, distal pulses 2+  b/l Resp: CTABL, no wheezes, normal WOB Abd: +BS, soft, NTND. no guarding or organomegaly Ext: No edema, warm Neuro: Alert and oriented, strength equal b/l UE and LE, coordination grossly normal MSK: normal muscle bulk  Assessment & Plan:  Zarria was seen today for pre-op exam.  Diagnoses and all orders for this visit:  Pre-op exam Risk factors include chronic diastolic CHF. Euvolemic today with BP under good control. Cr two weeks ago 1.8. Per RCRI has one risk factor. Exercise tolerance has been limited past couple of months but no SOB or CP with exertion. Discussed perioperative risk at least 1% for cardiac death, nonfatal MI, and nonfatal cardiac arrest according to predictors and 1.3% for rate of MI, pulmonary edema, ventricular fibrillation, primary cardiac arrest and Complete heart block. Pt wants to proceed with surgery, has significant amount of pain from her spinal stenosis that is affecting her life daily.  Insomnia, unspecified type Stable, cont below prn -     zolpidem (AMBIEN) 5 MG tablet; Take 1 tablet (5 mg total) by mouth at bedtime as needed for sleep.  Neuropathy Cont below -     gabapentin (NEURONTIN) 600 MG tablet; Take 1 tablet (600 mg total) by mouth 2 (two) times daily.  Depression, unspecified depression type -     venlafaxine (EFFEXOR) 100 MG tablet;  Take 1 tablet (100 mg total) by mouth 2 (two) times daily. (Patient not taking: Reported on 02/21/2018)   Follow up plan: No follow-ups on file. Assunta Found, MD [place]

## 2018-02-22 ENCOUNTER — Ambulatory Visit: Payer: Self-pay | Admitting: Neurology

## 2018-02-22 ENCOUNTER — Ambulatory Visit: Payer: Medicare Other | Admitting: Pediatrics

## 2018-02-22 NOTE — Telephone Encounter (Signed)
PA for Botox is approved through [facility] from 02/21/2018 through 05/24/2018. PT ID # [id number]

## 2018-02-23 ENCOUNTER — Other Ambulatory Visit (HOSPITAL_COMMUNITY): Payer: Self-pay | Admitting: *Deleted

## 2018-02-23 DIAGNOSIS — D472 Monoclonal gammopathy: Secondary | ICD-10-CM

## 2018-02-24 ENCOUNTER — Encounter: Payer: Self-pay | Admitting: Pediatrics

## 2018-02-26 ENCOUNTER — Inpatient Hospital Stay (HOSPITAL_BASED_OUTPATIENT_CLINIC_OR_DEPARTMENT_OTHER): Payer: Medicare Other | Admitting: Internal Medicine

## 2018-02-26 ENCOUNTER — Encounter (HOSPITAL_COMMUNITY): Payer: Self-pay | Admitting: Internal Medicine

## 2018-02-26 ENCOUNTER — Other Ambulatory Visit (HOSPITAL_COMMUNITY): Payer: Self-pay

## 2018-02-26 ENCOUNTER — Ambulatory Visit (HOSPITAL_COMMUNITY): Payer: Self-pay | Admitting: Hematology

## 2018-02-26 ENCOUNTER — Inpatient Hospital Stay (HOSPITAL_COMMUNITY): Payer: Medicare Other | Attending: Internal Medicine

## 2018-02-26 ENCOUNTER — Ambulatory Visit (HOSPITAL_COMMUNITY): Payer: Self-pay

## 2018-02-26 ENCOUNTER — Other Ambulatory Visit: Payer: Self-pay

## 2018-02-26 VITALS — BP 128/71 | HR 80 | Temp 97.9°F | Resp 18 | Wt 205.0 lb

## 2018-02-26 DIAGNOSIS — Z79899 Other long term (current) drug therapy: Secondary | ICD-10-CM

## 2018-02-26 DIAGNOSIS — I130 Hypertensive heart and chronic kidney disease with heart failure and stage 1 through stage 4 chronic kidney disease, or unspecified chronic kidney disease: Secondary | ICD-10-CM

## 2018-02-26 DIAGNOSIS — D72822 Plasmacytosis: Secondary | ICD-10-CM | POA: Insufficient documentation

## 2018-02-26 DIAGNOSIS — N183 Chronic kidney disease, stage 3 (moderate): Secondary | ICD-10-CM

## 2018-02-26 DIAGNOSIS — K219 Gastro-esophageal reflux disease without esophagitis: Secondary | ICD-10-CM | POA: Diagnosis not present

## 2018-02-26 DIAGNOSIS — Z87891 Personal history of nicotine dependence: Secondary | ICD-10-CM | POA: Diagnosis not present

## 2018-02-26 DIAGNOSIS — D472 Monoclonal gammopathy: Secondary | ICD-10-CM | POA: Diagnosis present

## 2018-02-26 DIAGNOSIS — I5032 Chronic diastolic (congestive) heart failure: Secondary | ICD-10-CM | POA: Diagnosis not present

## 2018-02-26 DIAGNOSIS — E785 Hyperlipidemia, unspecified: Secondary | ICD-10-CM | POA: Insufficient documentation

## 2018-02-26 DIAGNOSIS — F419 Anxiety disorder, unspecified: Secondary | ICD-10-CM | POA: Diagnosis not present

## 2018-02-26 DIAGNOSIS — M1711 Unilateral primary osteoarthritis, right knee: Secondary | ICD-10-CM | POA: Diagnosis not present

## 2018-02-26 DIAGNOSIS — J449 Chronic obstructive pulmonary disease, unspecified: Secondary | ICD-10-CM | POA: Diagnosis not present

## 2018-02-26 DIAGNOSIS — F329 Major depressive disorder, single episode, unspecified: Secondary | ICD-10-CM | POA: Diagnosis not present

## 2018-02-26 LAB — COMPREHENSIVE METABOLIC PANEL
ALT: 20 U/L (ref 14–54)
AST: 22 U/L (ref 15–41)
Albumin: 4.2 g/dL (ref 3.5–5.0)
Alkaline Phosphatase: 170 U/L — ABNORMAL HIGH (ref 38–126)
Anion gap: 11 (ref 5–15)
BUN: 33 mg/dL — ABNORMAL HIGH (ref 6–20)
CO2: 20 mmol/L — ABNORMAL LOW (ref 22–32)
Calcium: 9.5 mg/dL (ref 8.9–10.3)
Chloride: 104 mmol/L (ref 101–111)
Creatinine, Ser: 2.18 mg/dL — ABNORMAL HIGH (ref 0.44–1.00)
GFR calc Af Amer: 26 mL/min — ABNORMAL LOW (ref >60)
GFR calc non Af Amer: 22 mL/min — ABNORMAL LOW (ref >60)
Glucose, Bld: 90 mg/dL (ref 65–99)
Potassium: 4.9 mmol/L (ref 3.5–5.1)
Sodium: 135 mmol/L (ref 135–145)
Total Bilirubin: 0.7 mg/dL (ref 0.3–1.2)
Total Protein: 8.3 g/dL — ABNORMAL HIGH (ref 6.5–8.1)

## 2018-02-26 LAB — CBC WITH DIFFERENTIAL/PLATELET
Basophils Absolute: 0.1 10*3/uL (ref 0.0–0.1)
Basophils Relative: 1 %
Eosinophils Absolute: 0.3 10*3/uL (ref 0.0–0.7)
Eosinophils Relative: 5 %
HCT: 45.4 % (ref 36.0–46.0)
Hemoglobin: 14 g/dL (ref 12.0–15.0)
Lymphocytes Relative: 29 %
Lymphs Abs: 1.8 10*3/uL (ref 0.7–4.0)
MCH: 30 pg (ref 26.0–34.0)
MCHC: 30.8 g/dL (ref 30.0–36.0)
MCV: 97.4 fL (ref 78.0–100.0)
Monocytes Absolute: 0.4 10*3/uL (ref 0.1–1.0)
Monocytes Relative: 6 %
Neutro Abs: 3.7 10*3/uL (ref 1.7–7.7)
Neutrophils Relative %: 59 %
Platelets: 360 10*3/uL (ref 150–400)
RBC: 4.66 MIL/uL (ref 3.87–5.11)
RDW: 12.2 % (ref 11.5–15.5)
WBC: 6.3 10*3/uL (ref 4.0–10.5)

## 2018-02-26 NOTE — Patient Instructions (Signed)
Exam and discussion today with Dr. Walden Field. Return as scheduled in 6 months for lab work and office visit.  Call the clinic should you have any questions or concerns.

## 2018-02-26 NOTE — Progress Notes (Signed)
Diagnosis MGUS (monoclonal gammopathy of unknown significance) - Plan: CBC with Differential/Platelet, Comprehensive metabolic panel, Lactate dehydrogenase, Protein electrophoresis, serum, Kappa/lambda light chains, Beta 2 microglobuline, serum, IgG, IgA, IgM  Staging Cancer Staging No matching staging information was found for the patient.  Assessment and Plan:  1.  IgG kappa MGUS.  Pt had BMBX done 02/2014 with mild 5% polytypic plasmacytosis.  Recent labs done 02/26/2018 show HB 14, Cr 2.18 Ca++ 9.5.  SPEP pending.  She will RTC in 6 months for follow-up with CBC, Chem, SPEP,Quant IG, Free light chain evaluation.  Skeletal survey done 08/2017 shows no bone lesions.  She will have repeat skeletal survey in 08/2018.  She remains on observation.    2.  CKD Stage III.  Cr is 2.18 which is stable with labs done 08/2017.  She is followed by nephrology in [place].  Continue to follow-up as directed.    3.  HTN.  BP 128/71.  She should continue to follow-up with PCP.    4.  Diarrhea.  She denies this is new.  She is followed by GI in [place].  Potassium levels are normal.  Continue Imodium as recommended.    5.  Health maintenance.  She will follow-up with PCP for mammogram scheduling.  She will follow-up with GI as recommended.      Interval History: 67 yr old female with IgG Kappa MGUS (monoclonal gammopathy of unknown significance) Pt had BMBX 02/2014: Bone Marrow, Aspirate,Biopsy, and Clot, right iliac - NORMOCELLULAR MARROW WITH MILD PLASMACYTOSIS (5%). - SEE COMMENT. PERIPHERAL BLOOD: - UNREMARKABLE. Diagnosis Note The bone marrow is overall normocellular. There is a mild plasmacytosis (approximately 5%) which is polytypic by light chain immunohistochemistry. These findings are non-specific and are not diagnostic for a plasma cell neoplasm. Vicente Males MD Pathologist, Electronic Signature (Case signed 03/03/2014) [employer]  Interval history:  Pt is seen  today for follow-up.  She is complaining of diarrhea which is not new.  She is followed by GI in [place].   She is here to go over labs and recent skeletal survey.     Problem List Patient Active Problem List   Diagnosis Date Noted  . Trochanteric bursitis of right hip [M70.61] 12/21/2017  . Pain of right hip joint [M25.551] 12/13/2016  . Anxiety and depression [F41.9, F32.9] 08/31/2016  . Chronic back pain [M54.9, G89.29] 08/31/2016  . Memory disturbance [R41.3] 04/19/2016  . BMI 31.0-31.9,adult [Z68.31] 04/19/2016  . Chronic migraine [G43.709] 02/17/2016  . Severe episode of recurrent major depressive disorder, without psychotic features ([place]) [F33.2] 11/30/2015  . Syncope [R55] 11/30/2015  . COPD (chronic obstructive pulmonary disease) ([place]) [J44.9] 10/20/2015  . Osteoarthritis of right knee [M17.11] 10/20/2015  . Chronic kidney disease [N18.9] 10/06/2015  . Chronic diastolic congestive heart failure ([place]) [I50.32] 10/06/2015  . Confusion [R41.0] 09/13/2015  . Nausea vomiting and diarrhea [R11.2, R19.7]   . Gastroesophageal reflux disease without esophagitis [K21.9] 03/13/2015  . Neuropathy [G62.9] 03/13/2015  . Acute renal failure ([place]) [N17.9] 02/18/2015  . Metabolic encephalopathy [O84.16] 02/18/2015  . 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 Hyperkalemia [E87.5] 08/25/2013  . Conversion disorder [F44.9] 08/20/2013  . Proteinuria [R80.9] 05/14/2013  . Insomnia [G47.00] 03/25/2013  . Depression [F32.9] 03/25/2013  . Seizures ([place]) [R56.9] 03/25/2013  . MGUS (monoclonal gammopathy of unknown significance) [D47.2] 03/25/2013  . Essential hypertension [I10] 04/21/2010  . DOE (dyspnea on exertion) [R06.09] 04/21/2010  . CHEST PAIN-UNSPECIFIED [R07.9] 04/21/2010    Past Medical History Past Medical History:  Diagnosis Date  . Chronic headaches   . CKD (chronic kidney disease) stage 3, GFR 30-59 ml/min (HCC)   . Depression   . Encephalopathy   . Essential hypertension   .  Hyperlipidemia   . 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 Osteoporosis   . Restrictive lung disease   . Right leg weakness   . Seizures [facility])    first sz age 3, last seizure Aug 2017  . URI (upper respiratory infection)     Past Surgical History Past Surgical History:  Procedure Laterality Date  . ABDOMINAL HYSTERECTOMY    . APPENDECTOMY    . BACK SURGERY  2002  . BREAST REDUCTION SURGERY    . CHOLECYSTECTOMY    . LUNG LOBECTOMY Right    Cyst    Family History Family History  Problem Relation Age of Onset  . Hypertension Mother   . Heart disease Mother   . Diabetes Mother   . Kidney disease Mother   . Arthritis Mother   . Hypertension Father   . Hypertension Paternal Aunt   . Kidney disease Paternal Aunt   . Hypertension Son      Social History  reports that she quit smoking about 5 years ago. Her smoking use included cigarettes. She has a 30.00 pack-year smoking history. She has never used smokeless tobacco. She reports that she does not drink alcohol or use drugs.  Medications  Current Outpatient Medications:  .  acetaminophen (TYLENOL) 500 MG tablet, Take 1,000 mg by mouth 2 (two) times daily as needed for moderate pain., Disp: , Rfl:  .  Botulinum Toxin Type A (BOTOX) 200 units SOLR, Inject 200 Units as directed every 3 (three) months. (Patient not taking: Reported on 02/21/2018), Disp: 1 each, Rfl: 0 .  busPIRone (BUSPAR) 5 MG tablet, Take 1 tablet (5 mg total) by mouth 3 (three) times daily., Disp: 90 tablet, Rfl: 2 .  cholecalciferol (VITAMIN D) 1000 UNITS tablet, Take 1,000 Units by mouth daily., Disp: , Rfl:  .  fluticasone (FLONASE) 50 MCG/ACT nasal spray, Place 2 sprays into both nostrils daily. (Patient not taking: Reported on 02/21/2018), Disp: 16 g, Rfl: 2 .  gabapentin (NEURONTIN) 600 MG tablet, Take 1 tablet (600 mg total) by mouth 2 (two) times daily., Disp: 60 tablet, Rfl: 1 .  hydrALAZINE (APRESOLINE) 50 MG tablet, Take 1  tablet (50 mg total) by mouth 3 times daily. (Patient not taking: Reported on 02/21/2018), Disp: 90 tablet, Rfl: 0 .  hydrOXYzine (ATARAX/VISTARIL) 10 MG tablet, Take 10 mg by mouth 3 (three) times daily as needed for itching., Disp: , Rfl:  .  Ipratropium-Albuterol (COMBIVENT RESPIMAT) 20-100 MCG/ACT AERS respimat, INHALE 2 PUFFS INTO THE LUNGS TWICE A DAY (Patient taking differently: Inhale 2 puffs into the lungs 2 (two) times daily as needed for wheezing or shortness of breath. ), Disp: 4 g, Rfl: 4 .  levETIRAcetam (KEPPRA) 500 MG tablet, Take 1 tablet (500 mg total) by mouth 2 (two) times daily., Disp: 60 tablet, Rfl: 0 .  oxyCODONE-acetaminophen (PERCOCET) 5-325 MG tablet, Take 1 tablet by mouth 3 times daily as needed for pain, Disp: , Rfl:  .  pantoprazole (PROTONIX) 40 MG tablet, Take 1 tablet (40 mg total) by mouth daily., Disp: 30 tablet, Rfl: 5 .  pravastatin (PRAVACHOL) 40 MG tablet, Take 40 mg by mouth at bedtime., Disp: , Rfl:  .  topiramate (TOPAMAX) 15 MG capsule, Take 2 capsules (30 mg total) by mouth 2 (two)  times daily., Disp: 120 capsule, Rfl: 0 .  traZODone (DESYREL) 50 MG tablet, Take ONE-HALF (0.5) TO ONE (1) tablet (25-50 mg total) by mouth at bedtime as needed for sleep. (Patient taking differently: Take 50 mg by mouth at bedtime. ), Disp: 30 tablet, Rfl: 2 .  venlafaxine (EFFEXOR) 100 MG tablet, Take 1 tablet (100 mg total) by mouth 2 (two) times daily. (Patient not taking: Reported on 02/21/2018), Disp: 60 tablet, Rfl: 1 .  zolpidem (AMBIEN) 5 MG tablet, Take 1 tablet (5 mg total) by mouth at bedtime as needed for sleep., Disp: 30 tablet, Rfl: 1  Allergies Penicillins; Sulfonamide derivatives; Asa [aspirin]; and Rofecoxib  Review of Systems Review of Systems - Oncology ROS as per HPI otherwise 12 point ROS is negative.   Physical Exam  Vitals Wt Readings from Last 3 Encounters:  02/26/18 205 lb (93 kg)  02/21/18 210 lb 12.8 oz (95.6 kg)  01/09/18 208 lb (94.3 kg)    Temp Readings from Last 3 Encounters:  02/26/18 97.9 F (36.6 C) (Oral)  02/21/18 (!) 97.1 F (36.2 C) (Oral)  01/09/18 (!) 97.2 F (36.2 C) (Oral)   BP Readings from Last 3 Encounters:  02/26/18 128/71  02/21/18 120/74  02/05/18 120/60   Pulse Readings from Last 3 Encounters:  02/26/18 80  02/21/18 82  02/05/18 77   Constitutional: Well-developed, well-nourished, and in no distress.   HENT: Head: Normocephalic and atraumatic.  Mouth/Throat: No oropharyngeal exudate. Mucosa moist. Eyes: Pupils are equal, round, and reactive to light. Conjunctivae are normal. No scleral icterus.  Neck: Normal range of motion. Neck supple. No JVD present.  Cardiovascular: Normal rate, regular rhythm and normal heart sounds.  Exam reveals no gallop and no friction rub.   No murmur heard. Pulmonary/Chest: Effort normal and breath sounds normal. No respiratory distress. No wheezes.No rales.  Abdominal: Soft. Bowel sounds are normal. No distension. There is no tenderness. There is no guarding.  Musculoskeletal: No edema or tenderness.  Lymphadenopathy: No cervical, axillary or supraclavicular adenopathy.  Neurological: Alert and oriented to person, place, and time. No cranial nerve deficit.  Skin: Skin is warm and dry. No rash noted. No erythema. No pallor.  Psychiatric: Affect and judgment normal.   Labs Appointment on 02/26/2018  Component Date Value Ref Range Status  . WBC 02/26/2018 6.3  4.0 - 10.5 K/uL Final  . RBC 02/26/2018 4.66  3.87 - 5.11 MIL/uL Final  . Hemoglobin 02/26/2018 14.0  12.0 - 15.0 g/dL Final  . HCT 02/26/2018 45.4  36.0 - 46.0 % Final  . MCV 02/26/2018 97.4  78.0 - 100.0 fL Final  . MCH 02/26/2018 30.0  26.0 - 34.0 pg Final  . MCHC 02/26/2018 30.8  30.0 - 36.0 g/dL Final  . RDW 02/26/2018 12.2  11.5 - 15.5 % Final  . Platelets 02/26/2018 360  150 - 400 K/uL Final  . Neutrophils Relative % 02/26/2018 59  % Final  . Neutro Abs 02/26/2018 3.7  1.7 - 7.7 K/uL Final  .  Lymphocytes Relative 02/26/2018 29  % Final  . Lymphs Abs 02/26/2018 1.8  0.7 - 4.0 K/uL Final  . Monocytes Relative 02/26/2018 6  % Final  . Monocytes Absolute 02/26/2018 0.4  0.1 - 1.0 K/uL Final  . Eosinophils Relative 02/26/2018 5  % Final  . Eosinophils Absolute 02/26/2018 0.3  0.0 - 0.7 K/uL Final  . Basophils Relative 02/26/2018 1  % Final  . Basophils Absolute 02/26/2018 0.1  0.0 - 0.1 K/uL Final  Performed at [facility], [address] [place], [place] [address]  . Sodium 02/26/2018 135  135 - 145 mmol/L Final  . Potassium 02/26/2018 4.9  3.5 - 5.1 mmol/L Final  . Chloride 02/26/2018 104  101 - 111 mmol/L Final  . CO2 02/26/2018 20* 22 - 32 mmol/L Final  . Glucose, Bld 02/26/2018 90  65 - 99 mg/dL Final  . BUN 02/26/2018 33* 6 - 20 mg/dL Final  . Creatinine, Ser 02/26/2018 2.18* 0.44 - 1.00 mg/dL Final  . Calcium 02/26/2018 9.5  8.9 - 10.3 mg/dL Final  . Total Protein 02/26/2018 8.3* 6.5 - 8.1 g/dL Final  . Albumin 02/26/2018 4.2  3.5 - 5.0 g/dL Final  . AST 02/26/2018 22  15 - 41 U/L Final  . ALT 02/26/2018 20  14 - 54 U/L Final  . Alkaline Phosphatase 02/26/2018 170* 38 - 126 U/L Final  . Total Bilirubin 02/26/2018 0.7  0.3 - 1.2 mg/dL Final  . GFR calc non Af Amer 02/26/2018 22* >60 mL/min Final  . GFR calc Af Amer 02/26/2018 26* >60 mL/min Final   Comment: (NOTE) The eGFR has been calculated using the CKD EPI equation. This calculation has not been validated in all clinical situations. eGFR's persistently <60 mL/min signify possible Chronic Kidney Disease.   Georgiann Hahn gap 02/26/2018 11  5 - 15 Final   Performed at [facility], [address], [place], [place] [address]     Pathology Orders Placed This Encounter  Procedures  . CBC with Differential/Platelet    Standing Status:   Future    Standing Expiration Date:   02/27/2019  . Comprehensive metabolic panel    Standing Status:   Future    Standing Expiration Date:   02/27/2019  . Lactate dehydrogenase     Standing Status:   Future    Standing Expiration Date:   02/27/2019  . Protein electrophoresis, serum    Standing Status:   Future    Standing Expiration Date:   02/27/2019  . Kappa/lambda light chains    Standing Status:   Future    Standing Expiration Date:   02/27/2019  . Beta 2 microglobuline, serum    Standing Status:   Future    Standing Expiration Date:   02/27/2019  . IgG, IgA, IgM    Standing Status:   Future    Standing Expiration Date:   02/27/2019       Zoila Shutter MD

## 2018-02-27 LAB — KAPPA/LAMBDA LIGHT CHAINS
Kappa free light chain: 101.7 mg/L — ABNORMAL HIGH (ref 3.3–19.4)
Kappa, lambda light chain ratio: 2.79 — ABNORMAL HIGH (ref 0.26–1.65)
Lambda free light chains: 36.4 mg/L — ABNORMAL HIGH (ref 5.7–26.3)

## 2018-02-27 LAB — BETA 2 MICROGLOBULIN, SERUM: Beta-2 Microglobulin: 5.5 mg/L — ABNORMAL HIGH (ref 0.6–2.4)

## 2018-02-27 NOTE — Progress Notes (Addendum)
02/26/2018-noted in Epic-CBC w/diff.  07/06/2017- noted in Epic- EKG  08/10/2016- noted in Epic- ECHO

## 2018-02-28 ENCOUNTER — Other Ambulatory Visit: Payer: Self-pay

## 2018-02-28 ENCOUNTER — Encounter (HOSPITAL_COMMUNITY): Payer: Self-pay

## 2018-02-28 ENCOUNTER — Ambulatory Visit (HOSPITAL_COMMUNITY)
Admission: RE | Admit: 2018-02-28 | Discharge: 2018-02-28 | Disposition: A | Discharge status: Home / Self Care | Payer: Medicare Other | Source: Ambulatory Visit | Attending: Surgical | Admitting: Surgical

## 2018-02-28 ENCOUNTER — Encounter (HOSPITAL_COMMUNITY)
Admission: RE | Admit: 2018-02-28 | Discharge: 2018-02-28 | Disposition: A | Discharge status: Home / Self Care | Payer: Medicare Other | Source: Ambulatory Visit | Attending: Orthopedic Surgery | Admitting: Orthopedic Surgery

## 2018-02-28 DIAGNOSIS — Z01818 Encounter for other preprocedural examination: Secondary | ICD-10-CM

## 2018-02-28 DIAGNOSIS — M545 Low back pain, unspecified: Secondary | ICD-10-CM

## 2018-02-28 DIAGNOSIS — M5136 Other intervertebral disc degeneration, lumbar region: Secondary | ICD-10-CM | POA: Diagnosis not present

## 2018-02-28 DIAGNOSIS — J9811 Atelectasis: Secondary | ICD-10-CM | POA: Diagnosis not present

## 2018-02-28 DIAGNOSIS — Z01812 Encounter for preprocedural laboratory examination: Secondary | ICD-10-CM | POA: Insufficient documentation

## 2018-02-28 DIAGNOSIS — Z01810 Encounter for preprocedural cardiovascular examination: Secondary | ICD-10-CM | POA: Insufficient documentation

## 2018-02-28 LAB — COMPREHENSIVE METABOLIC PANEL
ALT: 20 U/L (ref 14–54)
AST: 24 U/L (ref 15–41)
Albumin: 4.1 g/dL (ref 3.5–5.0)
Alkaline Phosphatase: 163 U/L — ABNORMAL HIGH (ref 38–126)
Anion gap: 9 (ref 5–15)
BUN: 36 mg/dL — ABNORMAL HIGH (ref 6–20)
CO2: 22 mmol/L (ref 22–32)
Calcium: 9 mg/dL (ref 8.9–10.3)
Chloride: 107 mmol/L (ref 101–111)
Creatinine, Ser: 2.48 mg/dL — ABNORMAL HIGH (ref 0.44–1.00)
GFR calc Af Amer: 22 mL/min — ABNORMAL LOW (ref >60)
GFR calc non Af Amer: 19 mL/min — ABNORMAL LOW (ref >60)
Glucose, Bld: 83 mg/dL (ref 65–99)
Potassium: 5.7 mmol/L — ABNORMAL HIGH (ref 3.5–5.1)
Sodium: 138 mmol/L (ref 135–145)
Total Bilirubin: 0.3 mg/dL (ref 0.3–1.2)
Total Protein: 7.9 g/dL (ref 6.5–8.1)

## 2018-02-28 LAB — URINALYSIS, ROUTINE W REFLEX MICROSCOPIC
Bilirubin Urine: NEGATIVE
Glucose, UA: NEGATIVE mg/dL
Hgb urine dipstick: NEGATIVE
Ketones, ur: NEGATIVE mg/dL
Nitrite: NEGATIVE
Protein, ur: 100 mg/dL — AB
Specific Gravity, Urine: 1.02 (ref 1.005–1.030)
pH: 5 (ref 5.0–8.0)

## 2018-02-28 LAB — PROTIME-INR
INR: 1.17
Prothrombin Time: 14.8 seconds (ref 11.4–15.2)

## 2018-02-28 LAB — ABO/RH: ABO/RH(D): O POS

## 2018-02-28 LAB — APTT: aPTT: 46 seconds — ABNORMAL HIGH (ref 24–36)

## 2018-03-01 ENCOUNTER — Ambulatory Visit: Payer: Medicare Other | Admitting: Pediatrics

## 2018-03-01 LAB — MULTIPLE MYELOMA PANEL, SERUM
Albumin SerPl Elph-Mcnc: 3.5 g/dL (ref 2.9–4.4)
Albumin/Glob SerPl: 0.9 (ref 0.7–1.7)
Alpha 1: 0.2 g/dL (ref 0.0–0.4)
Alpha2 Glob SerPl Elph-Mcnc: 0.9 g/dL (ref 0.4–1.0)
B-Globulin SerPl Elph-Mcnc: 1.2 g/dL (ref 0.7–1.3)
Gamma Glob SerPl Elph-Mcnc: 1.7 g/dL (ref 0.4–1.8)
Globulin, Total: 4 g/dL — ABNORMAL HIGH (ref 2.2–3.9)
IgA: 324 mg/dL (ref 87–352)
IgG (Immunoglobin G), Serum: 1610 mg/dL — ABNORMAL HIGH (ref 700–1600)
IgM (Immunoglobulin M), Srm: 228 mg/dL — ABNORMAL HIGH (ref 26–217)
M Protein SerPl Elph-Mcnc: 0.5 g/dL — ABNORMAL HIGH
Total Protein ELP: 7.5 g/dL (ref 6.0–8.5)

## 2018-03-01 LAB — SURGICAL PCR SCREEN
MRSA, PCR: POSITIVE — AB
Staphylococcus aureus: POSITIVE — AB

## 2018-03-06 ENCOUNTER — Telehealth: Payer: Self-pay

## 2018-03-06 DIAGNOSIS — F32A Depression, unspecified: Secondary | ICD-10-CM

## 2018-03-06 DIAGNOSIS — F419 Anxiety disorder, unspecified: Secondary | ICD-10-CM

## 2018-03-06 DIAGNOSIS — F329 Major depressive disorder, single episode, unspecified: Secondary | ICD-10-CM

## 2018-03-06 MED ORDER — BUPIVACAINE LIPOSOME 1.3 % IJ SUSP
20.0000 mL | INTRAMUSCULAR | Status: AC
  Filled 2018-03-06: qty 20

## 2018-03-06 NOTE — H&P (Signed)
[name] is an 67 y.o. female.   Chief Complaint: low back pain HPI: The patient is a 67 year old female who presented to the office with the chief complaint of low back pain. She has had increased pain in her low back as well as her right LE over the past few months. She had lumbar surgery in 2002. She developed weakness in her right LE. CY myelogram showed a severe block at L4-L5 as well as a disc herniation at L4-L5 right.   Past Medical History:  Diagnosis Date  . Chronic headaches   . CKD (chronic kidney disease) stage 3, GFR 30-59 ml/min (HCC)   . Depression   . Encephalopathy   . Essential hypertension   . Hyperlipidemia   . 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 Osteoporosis   . Restrictive lung disease   . Right leg weakness   . Seizures [facility])    first sz age 10, last seizure Aug 2017  . URI (upper respiratory infection)     Past Surgical History:  Procedure Laterality Date  . ABDOMINAL HYSTERECTOMY    . APPENDECTOMY    . BACK SURGERY  2002  . BREAST REDUCTION SURGERY    . CHOLECYSTECTOMY    . LUNG LOBECTOMY Right    Cyst    Family History  Problem Relation Age of Onset  . Hypertension Mother   . Heart disease Mother   . Diabetes Mother   . Kidney disease Mother   . Arthritis Mother   . Hypertension Father   . Hypertension Paternal Aunt   . Kidney disease Paternal Aunt   . Hypertension Son    Social History:  reports that she quit smoking about 5 years ago. Her smoking use included cigarettes. She has a 30.00 pack-year smoking history. She has never used smokeless tobacco. She reports that she does not drink alcohol or use drugs.  Allergies:  Allergies  Allergen Reactions  . Penicillins Hives    Has patient had a PCN reaction causing immediate rash, facial/tongue/throat swelling, SOB or lightheadedness with hypotension: Yes Has patient had a PCN reaction causing severe rash involving mucus membranes or skin necrosis:  Yes-rash/hives Has patient had a PCN reaction that required hospitalization No Has patient had a PCN reaction occurring within the last 10 years: Yes If all of the above answers are "NO", then may proceed with Cephalosporin use.   . Sulfonamide Derivatives Hives  . Asa [Aspirin] Itching    High dose Aspirin causes itching but can take 81mg    . Rofecoxib Rash    Reaction to VIOXX    Current Outpatient Medications:  .  acetaminophen (TYLENOL) 500 MG tablet, Take 1,000 mg by mouth 2 (two) times daily as needed for moderate pain., Disp: , Rfl:  .  busPIRone (BUSPAR) 5 MG tablet, Take 1 tablet (5 mg total) by mouth 3 (three) times daily., Disp: 90 tablet, Rfl: 2 .  cholecalciferol (VITAMIN D) 1000 UNITS tablet, Take 1,000 Units by mouth daily., Disp: , Rfl:  .  gabapentin (NEURONTIN) 600 MG tablet, Take 1 tablet (600 mg total) by mouth 2 (two) times daily., Disp: 60 tablet, Rfl: 1 .  hydrOXYzine (ATARAX/VISTARIL) 10 MG tablet, Take 10 mg by mouth 3 (three) times daily as needed for itching., Disp: , Rfl:  .  Ipratropium-Albuterol (COMBIVENT RESPIMAT) 20-100 MCG/ACT AERS respimat, INHALE 2 PUFFS INTO THE LUNGS TWICE A DAY (Patient taking differently: Inhale 2 puffs into the lungs 2 (two) times  daily as needed for wheezing or shortness of breath. ), Disp: 4 g, Rfl: 4 .  oxyCODONE-acetaminophen (PERCOCET) 5-325 MG tablet, Take 1 tablet by mouth 3 times daily as needed for pain, Disp: , Rfl:  .  pantoprazole (PROTONIX) 40 MG tablet, Take 1 tablet (40 mg total) by mouth daily., Disp: 30 tablet, Rfl: 5 .  pravastatin (PRAVACHOL) 40 MG tablet, Take 40 mg by mouth at bedtime., Disp: , Rfl:  .  topiramate (TOPAMAX) 15 MG capsule, Take 2 capsules (30 mg total) by mouth 2 (two) times daily., Disp: 120 capsule, Rfl: 0 .  traZODone (DESYREL) 50 MG tablet, Take ONE-HALF (0.5) TO ONE (1) tablet (25-50 mg total) by mouth at bedtime as needed for sleep. (Patient taking differently: Take 50 mg by mouth at bedtime.  ), Disp: 30 tablet, Rfl: 2 .  zolpidem (AMBIEN) 5 MG tablet, Take 1 tablet (5 mg total) by mouth at bedtime as needed for sleep., Disp: 30 tablet, Rfl: 1 .  Botulinum Toxin Type A (BOTOX) 200 units SOLR, Inject 200 Units as directed every 3 (three) months. (Patient not taking: Reported on 02/21/2018), Disp: 1 each, Rfl: 0 .  fluticasone (FLONASE) 50 MCG/ACT nasal spray, Place 2 sprays into both nostrils daily. (Patient not taking: Reported on 02/21/2018), Disp: 16 g, Rfl: 2 .  hydrALAZINE (APRESOLINE) 50 MG tablet, Take 1 tablet (50 mg total) by mouth 3 times daily. (Patient not taking: Reported on 02/21/2018), Disp: 90 tablet, Rfl: 0 .  levETIRAcetam (KEPPRA) 500 MG tablet, Take 1 tablet (500 mg total) by mouth 2 (two) times daily., Disp: 60 tablet, Rfl: 0 .  venlafaxine (EFFEXOR) 100 MG tablet, Take 1 tablet (100 mg total) by mouth 2 (two) times daily. (Patient not taking: Reported on 02/21/2018), Disp: 60 tablet, Rfl: 1   Review of Systems  Constitutional: Negative.   HENT: Negative.   Eyes: Negative.   Respiratory: Negative.   Cardiovascular: Negative.   Gastrointestinal: Negative.   Genitourinary: Negative.   Musculoskeletal: Positive for back pain, joint pain and myalgias. Negative for falls and neck pain.  Skin: Negative.   Neurological: Positive for focal weakness. Negative for dizziness, tingling, tremors, sensory change, speech change, seizures, loss of consciousness, weakness and headaches.  Endo/Heme/Allergies: Negative.   Psychiatric/Behavioral: Negative.    Vitals BP: 120/70 HR: 80 bpm Weight: 205 lbs Height: 67 inches  Physical Exam  Constitutional: She is oriented to person, place, and time. She appears well-developed. No distress.  Overweight  HENT:  Head: Normocephalic and atraumatic.  Right Ear: External ear normal.  Left Ear: External ear normal.  Nose: Nose normal.  Mouth/Throat: Oropharynx is clear and moist.  Eyes: Conjunctivae and EOM are normal.  Neck:  Normal range of motion. Neck supple.  Cardiovascular: Normal rate, regular rhythm, normal heart sounds and intact distal pulses.  No murmur heard. Respiratory: Effort normal and breath sounds normal. No respiratory distress. She has no wheezes.  GI: Soft. Bowel sounds are normal. She exhibits no distension. There is no tenderness.  Musculoskeletal:       Right hip: Normal.       Left hip: Normal.       Right knee: Normal.       Left knee: Normal.       Lumbar back: She exhibits decreased range of motion, pain and spasm. She exhibits no bony tenderness.  Neurological: She is alert and oriented to person, place, and time. No sensory deficit.  Weakness of the dorsiflexors on the right  Skin: No rash noted. She is not diaphoretic. No erythema.  Psychiatric: She has a normal mood and affect. Her behavior is normal.     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 Risks and benefits of the surgery discussed with the patient by Dr. Gladstone Lighter. She will kept overnight for observation.   H&P performed by Dr. Gladstone Lighter Documented by Ardeen Jourdain, PA-C   Deah [place]taway Ander Purpura, PA-C 03/06/2018, 1:07 PM

## 2018-03-06 NOTE — BH Specialist Note (Signed)
[place] Telephone Follow-up  MRN: [id number] NAME: [name] Date: 03/06/18   Total time: 20 minutes Call number: 4/6  Reason for call today: Reason for Contact: PHQ9-8 weeks  PHQ-9 Scores:  Depression screen [facility] 2/9 03/06/2018 02/21/2018 02/15/2018 02/06/2018 01/31/2018  Decreased Interest 1 3 2 3 3   Down, Depressed, Hopeless 2 3 3 3 3   PHQ - 2 Score 3 6 5 6 6   Altered sleeping 1 2 0 2 2  Tired, decreased energy 2 0 1 1 1   Change in appetite 0 0 0 2 0  Feeling bad or failure about yourself  1 0 3 2 1   Trouble concentrating - 0 1 1 0  Moving slowly or fidgety/restless 3 0 0 0 0  Suicidal thoughts 0 0 0 0 0  PHQ-9 Score 10 8 10 14 10   Difficult doing work/chores Very difficult Somewhat difficult - - -  Some recent data might be hidden   GAD-7 Scores:  GAD 7 : Generalized Anxiety Score 03/06/2018 02/15/2018 02/06/2018 01/31/2018  Nervous, Anxious, on Edge 1 3 3 2   Control/stop worrying 3 3 3 2   Worry too much - different things 1 3 3 2   Trouble relaxing 1 1 2 2   Restless 1 0 1 3  Easily annoyed or irritable 0 0 2 0  Afraid - awful might happen 3 1 1 1   Total GAD 7 Score 10 11 15 12   Anxiety Difficulty - - - -    Stress Current stressors: Current Stressors: (Surgery on 03/07/2018 Central decompression, microdisectomy L4-L5 right) Sleep: Sleep: (S) Difficulty staying asleep(Worried about upcoming surgery. ) Appetite: Appetite: No problems Coping ability: Coping ability: Normal Patient taking medications as prescribed: Patient taking medications as prescribed: Yes  Current medications:  Outpatient Encounter Medications as of 03/06/2018  Medication Sig  . acetaminophen (TYLENOL) 500 MG tablet Take 1,000 mg by mouth 2 (two) times daily as needed for moderate pain.  . Botulinum Toxin Type A (BOTOX) 200 units SOLR Inject 200 Units as directed every 3 (three) months. (Patient not taking: Reported on 02/21/2018)  . busPIRone (BUSPAR) 5 MG tablet Take 1 tablet (5 mg  total) by mouth 3 (three) times daily.  . cholecalciferol (VITAMIN D) 1000 UNITS tablet Take 1,000 Units by mouth daily.  . fluticasone (FLONASE) 50 MCG/ACT nasal spray Place 2 sprays into both nostrils daily. (Patient not taking: Reported on 02/21/2018)  . gabapentin (NEURONTIN) 600 MG tablet Take 1 tablet (600 mg total) by mouth 2 (two) times daily.  . hydrALAZINE (APRESOLINE) 50 MG tablet Take 1 tablet (50 mg total) by mouth 3 times daily. (Patient not taking: Reported on 02/21/2018)  . hydrOXYzine (ATARAX/VISTARIL) 10 MG tablet Take 10 mg by mouth 3 (three) times daily as needed for itching.  . Ipratropium-Albuterol (COMBIVENT RESPIMAT) 20-100 MCG/ACT AERS respimat INHALE 2 PUFFS INTO THE LUNGS TWICE A DAY (Patient taking differently: Inhale 2 puffs into the lungs 2 (two) times daily as needed for wheezing or shortness of breath. )  . levETIRAcetam (KEPPRA) 500 MG tablet Take 1 tablet (500 mg total) by mouth 2 (two) times daily.  [name] oxyCODONE-acetaminophen (PERCOCET) 5-325 MG tablet Take 1 tablet by mouth 3 times daily as needed for pain  . pantoprazole (PROTONIX) 40 MG tablet Take 1 tablet (40 mg total) by mouth daily.  . pravastatin (PRAVACHOL) 40 MG tablet Take 40 mg by mouth at bedtime.  . topiramate (TOPAMAX) 15 MG capsule Take 2 capsules (30 mg total) by  mouth 2 (two) times daily.  . traZODone (DESYREL) 50 MG tablet Take ONE-HALF (0.5) TO ONE (1) tablet (25-50 mg total) by mouth at bedtime as needed for sleep. (Patient taking differently: Take 50 mg by mouth at bedtime. )  . venlafaxine (EFFEXOR) 100 MG tablet Take 1 tablet (100 mg total) by mouth 2 (two) times daily. (Patient not taking: Reported on 02/21/2018)  . zolpidem (AMBIEN) 5 MG tablet Take 1 tablet (5 mg total) by mouth at bedtime as needed for sleep.   Facility-Administered Encounter Medications as of 03/06/2018  Medication  . [START ON 03/07/2018] bupivacaine liposome (EXPAREL) 1.3 % injection 266 mg     Self-harm Behaviors Risk  Assessment Self-harm risk factors: Self-harm risk factors: (NA) Patient endorses recent thoughts of harming self: Have you recently had any thoughts about harming yourself?: No     Danger to Others Risk Assessment Danger to others risk factors: Danger to Others Risk Factors: No risk factors noted Patient endorses recent thoughts of harming others: Notification required: No need or identified person    Goals, Interventions and Follow-up Plan Goals: Increase healthy adjustment to current life circumstances Interventions: Motivational Interviewing, Solution-Focused Strategies and Mindfulness or Relaxation Training  Follow-up Plan:  1. Focusing on how much better she will feel after this needed surgery.    2.  Discussing activities that she will be able to engage in after her surgery.   3.  Discussing neative self-talk regarding the surgery.   4.  Actively listening to her concerns and fears regarding the surgery.      Summary:  Patient is a 67 year old female that reports feelings of relief and anxiety associated with her surgery tomorrow 4/-08-2018 (Central Decompression, Microdisectomy L4-L5 Right).    Writer received collateral information from the patient's son.  Per her son the patient's depression and anxiety has decreased because she will not be in pain anymore.    [name], LCAS-A

## 2018-03-07 ENCOUNTER — Ambulatory Visit (HOSPITAL_COMMUNITY): Payer: Medicare Other

## 2018-03-07 ENCOUNTER — Observation Stay (HOSPITAL_COMMUNITY)
Admission: RE | Admit: 2018-03-07 | Discharge: 2018-03-08 | Disposition: A | Discharge status: Home / Self Care | Payer: Medicare Other | Source: Ambulatory Visit | Attending: Orthopedic Surgery | Admitting: Orthopedic Surgery

## 2018-03-07 ENCOUNTER — Other Ambulatory Visit: Payer: Self-pay

## 2018-03-07 ENCOUNTER — Ambulatory Visit (HOSPITAL_COMMUNITY): Payer: Medicare Other | Admitting: Anesthesiology

## 2018-03-07 ENCOUNTER — Encounter (HOSPITAL_COMMUNITY): Payer: Self-pay | Admitting: Orthopedic Surgery

## 2018-03-07 ENCOUNTER — Encounter (HOSPITAL_COMMUNITY)
Admission: RE | Disposition: A | Discharge status: Home / Self Care | Payer: Self-pay | Source: Ambulatory Visit | Attending: Orthopedic Surgery

## 2018-03-07 DIAGNOSIS — Z888 Allergy status to other drugs, medicaments and biological substances status: Secondary | ICD-10-CM | POA: Insufficient documentation

## 2018-03-07 DIAGNOSIS — M48061 Spinal stenosis, lumbar region without neurogenic claudication: Secondary | ICD-10-CM | POA: Diagnosis present

## 2018-03-07 DIAGNOSIS — M810 Age-related osteoporosis without current pathological fracture: Secondary | ICD-10-CM | POA: Insufficient documentation

## 2018-03-07 DIAGNOSIS — J984 Other disorders of lung: Secondary | ICD-10-CM | POA: Insufficient documentation

## 2018-03-07 DIAGNOSIS — Z841 Family history of disorders of kidney and ureter: Secondary | ICD-10-CM | POA: Insufficient documentation

## 2018-03-07 DIAGNOSIS — Z90710 Acquired absence of both cervix and uterus: Secondary | ICD-10-CM | POA: Diagnosis not present

## 2018-03-07 DIAGNOSIS — Z880 Allergy status to penicillin: Secondary | ICD-10-CM | POA: Insufficient documentation

## 2018-03-07 DIAGNOSIS — F329 Major depressive disorder, single episode, unspecified: Secondary | ICD-10-CM | POA: Diagnosis not present

## 2018-03-07 DIAGNOSIS — G9340 Encephalopathy, unspecified: Secondary | ICD-10-CM | POA: Diagnosis not present

## 2018-03-07 DIAGNOSIS — Z87891 Personal history of nicotine dependence: Secondary | ICD-10-CM | POA: Diagnosis not present

## 2018-03-07 DIAGNOSIS — Z9049 Acquired absence of other specified parts of digestive tract: Secondary | ICD-10-CM | POA: Diagnosis not present

## 2018-03-07 DIAGNOSIS — E669 Obesity, unspecified: Secondary | ICD-10-CM | POA: Insufficient documentation

## 2018-03-07 DIAGNOSIS — Z8261 Family history of arthritis: Secondary | ICD-10-CM | POA: Diagnosis not present

## 2018-03-07 DIAGNOSIS — Z6831 Body mass index (BMI) 31.0-31.9, adult: Secondary | ICD-10-CM | POA: Insufficient documentation

## 2018-03-07 DIAGNOSIS — Z882 Allergy status to sulfonamides status: Secondary | ICD-10-CM | POA: Insufficient documentation

## 2018-03-07 DIAGNOSIS — Z833 Family history of diabetes mellitus: Secondary | ICD-10-CM | POA: Insufficient documentation

## 2018-03-07 DIAGNOSIS — Z9889 Other specified postprocedural states: Secondary | ICD-10-CM | POA: Diagnosis not present

## 2018-03-07 DIAGNOSIS — Z79899 Other long term (current) drug therapy: Secondary | ICD-10-CM | POA: Insufficient documentation

## 2018-03-07 DIAGNOSIS — M48062 Spinal stenosis, lumbar region with neurogenic claudication: Principal | ICD-10-CM | POA: Insufficient documentation

## 2018-03-07 DIAGNOSIS — I129 Hypertensive chronic kidney disease with stage 1 through stage 4 chronic kidney disease, or unspecified chronic kidney disease: Secondary | ICD-10-CM | POA: Diagnosis not present

## 2018-03-07 DIAGNOSIS — E1122 Type 2 diabetes mellitus with diabetic chronic kidney disease: Secondary | ICD-10-CM | POA: Insufficient documentation

## 2018-03-07 DIAGNOSIS — Z452 Encounter for adjustment and management of vascular access device: Secondary | ICD-10-CM

## 2018-03-07 DIAGNOSIS — Z8249 Family history of ischemic heart disease and other diseases of the circulatory system: Secondary | ICD-10-CM | POA: Diagnosis not present

## 2018-03-07 DIAGNOSIS — M21371 Foot drop, right foot: Secondary | ICD-10-CM | POA: Insufficient documentation

## 2018-03-07 DIAGNOSIS — M5126 Other intervertebral disc displacement, lumbar region: Secondary | ICD-10-CM | POA: Insufficient documentation

## 2018-03-07 DIAGNOSIS — E785 Hyperlipidemia, unspecified: Secondary | ICD-10-CM | POA: Diagnosis not present

## 2018-03-07 DIAGNOSIS — Z886 Allergy status to analgesic agent status: Secondary | ICD-10-CM | POA: Insufficient documentation

## 2018-03-07 DIAGNOSIS — N183 Chronic kidney disease, stage 3 (moderate): Secondary | ICD-10-CM | POA: Diagnosis not present

## 2018-03-07 DIAGNOSIS — D472 Monoclonal gammopathy: Secondary | ICD-10-CM | POA: Insufficient documentation

## 2018-03-07 DIAGNOSIS — M549 Dorsalgia, unspecified: Secondary | ICD-10-CM

## 2018-03-07 DIAGNOSIS — M898X8 Other specified disorders of bone, other site: Secondary | ICD-10-CM | POA: Insufficient documentation

## 2018-03-07 DIAGNOSIS — G8929 Other chronic pain: Secondary | ICD-10-CM

## 2018-03-07 HISTORY — PX: LUMBAR LAMINECTOMY/DECOMPRESSION MICRODISCECTOMY: SHX5026

## 2018-03-07 LAB — TYPE AND SCREEN
ABO/RH(D): O POS
Antibody Screen: NEGATIVE

## 2018-03-07 SURGERY — LUMBAR LAMINECTOMY/DECOMPRESSION MICRODISCECTOMY
Anesthesia: General

## 2018-03-07 MED ORDER — MIDAZOLAM HCL 2 MG/2ML IJ SOLN
INTRAMUSCULAR | Status: AC
  Administered 2018-03-07: 2 mg via INTRAVENOUS
  Filled 2018-03-07: qty 2

## 2018-03-07 MED ORDER — LIDOCAINE 2% (20 MG/ML) 5 ML SYRINGE
INTRAMUSCULAR | Status: DC | PRN
  Administered 2018-03-07: 60 mg via INTRAVENOUS

## 2018-03-07 MED ORDER — IPRATROPIUM-ALBUTEROL 20-100 MCG/ACT IN AERS: 2.0000 | INHALATION_SPRAY | Freq: Two times a day (BID) | RESPIRATORY_TRACT | Status: DC | PRN

## 2018-03-07 MED ORDER — LACTATED RINGERS IV SOLN: INTRAVENOUS | Status: DC

## 2018-03-07 MED ORDER — CHLORHEXIDINE GLUCONATE 4 % EX LIQD: 60.0000 mL | Freq: Once | CUTANEOUS | Status: DC

## 2018-03-07 MED ORDER — ONDANSETRON HCL 4 MG/2ML IJ SOLN
INTRAMUSCULAR | Status: DC | PRN
  Administered 2018-03-07: 4 mg via INTRAVENOUS

## 2018-03-07 MED ORDER — HYDROMORPHONE HCL 1 MG/ML IJ SOLN
0.5000 mg | INTRAMUSCULAR | Status: DC | PRN
  Administered 2018-03-07 – 2018-03-08 (×3): 0.5 mg via INTRAVENOUS
  Filled 2018-03-07 (×3): qty 0.5

## 2018-03-07 MED ORDER — PROPOFOL 10 MG/ML IV BOLUS
INTRAVENOUS | Status: DC | PRN
  Administered 2018-03-07: 100 mg via INTRAVENOUS

## 2018-03-07 MED ORDER — BISACODYL 5 MG PO TBEC: 5.0000 mg | DELAYED_RELEASE_TABLET | Freq: Every day | ORAL | Status: DC | PRN

## 2018-03-07 MED ORDER — HYDROCODONE-ACETAMINOPHEN 10-325 MG PO TABS
2.0000 | ORAL_TABLET | ORAL | Status: DC | PRN
  Administered 2018-03-07 – 2018-03-08 (×2): 2 via ORAL
  Filled 2018-03-07 (×3): qty 2

## 2018-03-07 MED ORDER — ONDANSETRON HCL 4 MG PO TABS: 4.0000 mg | ORAL_TABLET | Freq: Four times a day (QID) | ORAL | Status: DC | PRN

## 2018-03-07 MED ORDER — TRAZODONE HCL 50 MG PO TABS
50.0000 mg | ORAL_TABLET | Freq: Every day | ORAL | Status: DC
  Administered 2018-03-07: 22:00:00 50 mg via ORAL
  Filled 2018-03-07: qty 1

## 2018-03-07 MED ORDER — LACTATED RINGERS IV SOLN
INTRAVENOUS | Status: DC
  Administered 2018-03-07 – 2018-03-08 (×2): via INTRAVENOUS

## 2018-03-07 MED ORDER — FLEET ENEMA 7-19 GM/118ML RE ENEM: 1.0000 | ENEMA | Freq: Once | RECTAL | Status: DC | PRN

## 2018-03-07 MED ORDER — VENLAFAXINE HCL 50 MG PO TABS
100.0000 mg | ORAL_TABLET | Freq: Two times a day (BID) | ORAL | Status: DC
  Administered 2018-03-07 – 2018-03-08 (×2): 100 mg via ORAL
  Filled 2018-03-07 (×2): qty 2

## 2018-03-07 MED ORDER — OXYCODONE-ACETAMINOPHEN 5-325 MG PO TABS: 1.0000 | ORAL_TABLET | ORAL | 0 refills | Status: AC | PRN

## 2018-03-07 MED ORDER — BACITRACIN-NEOMYCIN-POLYMYXIN 400-5-5000 EX OINT
TOPICAL_OINTMENT | CUTANEOUS | Status: DC | PRN
  Administered 2018-03-07: 1 via TOPICAL

## 2018-03-07 MED ORDER — VANCOMYCIN HCL IN DEXTROSE 1-5 GM/200ML-% IV SOLN
1000.0000 mg | INTRAVENOUS | Status: AC
  Administered 2018-03-07: 1000 mg via INTRAVENOUS
  Filled 2018-03-07: qty 200

## 2018-03-07 MED ORDER — PHENYLEPHRINE 40 MCG/ML (10ML) SYRINGE FOR IV PUSH (FOR BLOOD PRESSURE SUPPORT)
PREFILLED_SYRINGE | INTRAVENOUS | Status: DC | PRN
  Administered 2018-03-07: 120 ug via INTRAVENOUS
  Administered 2018-03-07 (×2): 80 ug via INTRAVENOUS
  Administered 2018-03-07: 120 ug via INTRAVENOUS

## 2018-03-07 MED ORDER — BUSPIRONE HCL 5 MG PO TABS
5.0000 mg | ORAL_TABLET | Freq: Three times a day (TID) | ORAL | Status: DC
  Administered 2018-03-07 – 2018-03-08 (×3): 5 mg via ORAL
  Filled 2018-03-07 (×3): qty 1

## 2018-03-07 MED ORDER — THROMBIN (RECOMBINANT) 5000 UNITS EX SOLR
CUTANEOUS | Status: AC
  Filled 2018-03-07: qty 5000

## 2018-03-07 MED ORDER — HYDROMORPHONE HCL 1 MG/ML IJ SOLN
INTRAMUSCULAR | Status: AC
  Filled 2018-03-07: qty 1

## 2018-03-07 MED ORDER — FENTANYL CITRATE (PF) 250 MCG/5ML IJ SOLN
INTRAMUSCULAR | Status: AC
  Filled 2018-03-07: qty 5

## 2018-03-07 MED ORDER — IPRATROPIUM-ALBUTEROL 0.5-2.5 (3) MG/3ML IN SOLN: 3.0000 mL | Freq: Two times a day (BID) | RESPIRATORY_TRACT | Status: DC | PRN

## 2018-03-07 MED ORDER — PANTOPRAZOLE SODIUM 40 MG PO TBEC
40.0000 mg | DELAYED_RELEASE_TABLET | Freq: Every day | ORAL | Status: DC
  Administered 2018-03-07 – 2018-03-08 (×3): 40 mg via ORAL
  Filled 2018-03-07 (×3): qty 1

## 2018-03-07 MED ORDER — PROMETHAZINE HCL 25 MG/ML IJ SOLN: 6.2500 mg | INTRAMUSCULAR | Status: DC | PRN

## 2018-03-07 MED ORDER — ACETAMINOPHEN 650 MG RE SUPP: 650.0000 mg | RECTAL | Status: DC | PRN

## 2018-03-07 MED ORDER — OXYCODONE-ACETAMINOPHEN 5-325 MG PO TABS
1.0000 | ORAL_TABLET | ORAL | Status: DC | PRN
  Administered 2018-03-07 – 2018-03-08 (×2): 1 via ORAL
  Filled 2018-03-07 (×3): qty 1

## 2018-03-07 MED ORDER — FLUTICASONE PROPIONATE 50 MCG/ACT NA SUSP: 2.0000 | Freq: Every day | NASAL | Status: DC

## 2018-03-07 MED ORDER — HYDROMORPHONE HCL 1 MG/ML IJ SOLN
0.2500 mg | INTRAMUSCULAR | Status: DC | PRN
  Administered 2018-03-07 (×4): 0.25 mg via INTRAVENOUS
  Administered 2018-03-07: 0.5 mg via INTRAVENOUS

## 2018-03-07 MED ORDER — POLYETHYLENE GLYCOL 3350 17 G PO PACK: 17.0000 g | PACK | Freq: Every day | ORAL | Status: DC | PRN

## 2018-03-07 MED ORDER — DEXAMETHASONE SODIUM PHOSPHATE 10 MG/ML IJ SOLN
INTRAMUSCULAR | Status: DC | PRN
  Administered 2018-03-07: 4 mg via INTRAVENOUS

## 2018-03-07 MED ORDER — VANCOMYCIN HCL IN DEXTROSE 1-5 GM/200ML-% IV SOLN
1000.0000 mg | Freq: Once | INTRAVENOUS | Status: AC
  Administered 2018-03-08: 1000 mg via INTRAVENOUS
  Filled 2018-03-07: qty 200

## 2018-03-07 MED ORDER — FENTANYL CITRATE (PF) 100 MCG/2ML IJ SOLN
INTRAMUSCULAR | Status: AC
  Administered 2018-03-07: 100 ug via INTRAVENOUS
  Filled 2018-03-07: qty 2

## 2018-03-07 MED ORDER — SODIUM CHLORIDE 0.9 % IV SOLN
INTRAVENOUS | Status: DC
  Administered 2018-03-07 (×3): via INTRAVENOUS

## 2018-03-07 MED ORDER — SODIUM CHLORIDE 0.9 % IV SOLN
INTRAVENOUS | Status: AC
  Filled 2018-03-07: qty 500000

## 2018-03-07 MED ORDER — BACITRACIN-NEOMYCIN-POLYMYXIN 400-5-5000 EX OINT
TOPICAL_OINTMENT | CUTANEOUS | Status: AC
  Filled 2018-03-07: qty 1

## 2018-03-07 MED ORDER — SODIUM CHLORIDE 0.9 % IV SOLN
INTRAVENOUS | Status: DC | PRN
  Administered 2018-03-07 (×2): 25 ug/min via INTRAVENOUS

## 2018-03-07 MED ORDER — ROCURONIUM BROMIDE 10 MG/ML (PF) SYRINGE
PREFILLED_SYRINGE | INTRAVENOUS | Status: DC | PRN
  Administered 2018-03-07: 50 mg via INTRAVENOUS

## 2018-03-07 MED ORDER — METHOCARBAMOL 500 MG PO TABS
500.0000 mg | ORAL_TABLET | Freq: Four times a day (QID) | ORAL | Status: DC | PRN
  Administered 2018-03-07: 500 mg via ORAL
  Filled 2018-03-07: qty 1

## 2018-03-07 MED ORDER — ACETAMINOPHEN 325 MG PO TABS: 650.0000 mg | ORAL_TABLET | ORAL | Status: DC | PRN

## 2018-03-07 MED ORDER — ONDANSETRON HCL 4 MG/2ML IJ SOLN
4.0000 mg | Freq: Four times a day (QID) | INTRAMUSCULAR | Status: DC | PRN
  Administered 2018-03-08: 4 mg via INTRAVENOUS
  Filled 2018-03-07: qty 2

## 2018-03-07 MED ORDER — BUPIVACAINE-EPINEPHRINE (PF) 0.5% -1:200000 IJ SOLN
INTRAMUSCULAR | Status: DC | PRN
  Administered 2018-03-07: 20 mL

## 2018-03-07 MED ORDER — METHOCARBAMOL 500 MG PO TABS: 500.0000 mg | ORAL_TABLET | Freq: Four times a day (QID) | ORAL | 0 refills | Status: DC | PRN

## 2018-03-07 MED ORDER — TOPIRAMATE 15 MG PO CPSP: 30.0000 mg | ORAL_CAPSULE | Freq: Two times a day (BID) | ORAL | Status: DC

## 2018-03-07 MED ORDER — BUPIVACAINE-EPINEPHRINE (PF) 0.5% -1:200000 IJ SOLN
INTRAMUSCULAR | Status: AC
Start: 2018-03-07 — End: ?
  Filled 2018-03-07: qty 30

## 2018-03-07 MED ORDER — FENTANYL CITRATE (PF) 250 MCG/5ML IJ SOLN
INTRAMUSCULAR | Status: DC | PRN
  Administered 2018-03-07 (×5): 50 ug via INTRAVENOUS

## 2018-03-07 MED ORDER — LEVETIRACETAM 500 MG PO TABS
500.0000 mg | ORAL_TABLET | Freq: Two times a day (BID) | ORAL | Status: DC
  Administered 2018-03-07 – 2018-03-08 (×2): 500 mg via ORAL
  Filled 2018-03-07 (×2): qty 1

## 2018-03-07 MED ORDER — METHOCARBAMOL 1000 MG/10ML IJ SOLN
500.0000 mg | Freq: Four times a day (QID) | INTRAVENOUS | Status: DC | PRN
  Administered 2018-03-07: 500 mg via INTRAVENOUS
  Filled 2018-03-07: qty 550

## 2018-03-07 MED ORDER — SODIUM CHLORIDE 0.9 % IV SOLN
INTRAVENOUS | Status: DC | PRN
  Administered 2018-03-07: 500 mL

## 2018-03-07 MED ORDER — THROMBIN (RECOMBINANT) 5000 UNITS EX SOLR
CUTANEOUS | Status: DC | PRN
  Administered 2018-03-07: 5000 [IU] via TOPICAL

## 2018-03-07 MED ORDER — BUPIVACAINE LIPOSOME 1.3 % IJ SUSP
INTRAMUSCULAR | Status: DC | PRN
  Administered 2018-03-07: 20 mL

## 2018-03-07 MED ORDER — ZOLPIDEM TARTRATE 5 MG PO TABS: 5.0000 mg | ORAL_TABLET | Freq: Every evening | ORAL | Status: DC | PRN

## 2018-03-07 MED ORDER — FENTANYL CITRATE (PF) 100 MCG/2ML IJ SOLN
50.0000 ug | INTRAMUSCULAR | Status: DC
  Administered 2018-03-07: 100 ug via INTRAVENOUS

## 2018-03-07 MED ORDER — SUGAMMADEX SODIUM 200 MG/2ML IV SOLN
INTRAVENOUS | Status: DC | PRN
  Administered 2018-03-07: 200 mg via INTRAVENOUS

## 2018-03-07 MED ORDER — PHENOL 1.4 % MT LIQD: 1.0000 | OROMUCOSAL | Status: DC | PRN

## 2018-03-07 MED ORDER — GABAPENTIN 300 MG PO CAPS
600.0000 mg | ORAL_CAPSULE | Freq: Two times a day (BID) | ORAL | Status: DC
  Administered 2018-03-07 – 2018-03-08 (×2): 600 mg via ORAL
  Filled 2018-03-07 (×2): qty 2

## 2018-03-07 MED ORDER — MENTHOL 3 MG MT LOZG: 1.0000 | LOZENGE | OROMUCOSAL | Status: DC | PRN

## 2018-03-07 MED ORDER — PROPOFOL 10 MG/ML IV BOLUS
INTRAVENOUS | Status: AC
  Filled 2018-03-07: qty 20

## 2018-03-07 MED ORDER — MIDAZOLAM HCL 2 MG/2ML IJ SOLN
1.0000 mg | INTRAMUSCULAR | Status: DC
  Administered 2018-03-07: 2 mg via INTRAVENOUS

## 2018-03-07 SURGICAL SUPPLY — 44 items
BAG ZIPLOCK 12X15 (MISCELLANEOUS) IMPLANT
CLEANER TIP ELECTROSURG 2X2 (MISCELLANEOUS) ×2 IMPLANT
COVER SURGICAL LIGHT HANDLE (MISCELLANEOUS) ×2 IMPLANT
DRAIN PENROSE 18X1/4 LTX STRL (WOUND CARE) IMPLANT
DRAPE MICROSCOPE LEICA (MISCELLANEOUS) ×2 IMPLANT
DRAPE POUCH INSTRU U-SHP 10X18 (DRAPES) ×2 IMPLANT
DRAPE SHEET LG 3/4 BI-LAMINATE (DRAPES) ×2 IMPLANT
DRAPE SURG 17X11 SM STRL (DRAPES) ×2 IMPLANT
DRSG ADAPTIC 3X8 NADH LF (GAUZE/BANDAGES/DRESSINGS) ×2 IMPLANT
DRSG PAD ABDOMINAL 8X10 ST (GAUZE/BANDAGES/DRESSINGS) ×6 IMPLANT
DURAPREP 26ML APPLICATOR (WOUND CARE) ×2 IMPLANT
ELECT BLADE TIP CTD 4 INCH (ELECTRODE) ×2 IMPLANT
ELECT REM PT RETURN 15FT ADLT (MISCELLANEOUS) ×2 IMPLANT
GAUZE SPONGE 4X4 12PLY STRL (GAUZE/BANDAGES/DRESSINGS) ×2 IMPLANT
GLOVE BIOGEL PI IND STRL 6.5 (GLOVE) ×1 IMPLANT
GLOVE BIOGEL PI IND STRL 8.5 (GLOVE) ×1 IMPLANT
GLOVE BIOGEL PI INDICATOR 6.5 (GLOVE) ×1
GLOVE BIOGEL PI INDICATOR 8.5 (GLOVE) ×1
GLOVE ECLIPSE 8.0 STRL XLNG CF (GLOVE) ×4 IMPLANT
GLOVE SURG SS PI 6.5 STRL IVOR (GLOVE) ×2 IMPLANT
GOWN STRL REUS W/ TWL LRG LVL3 (GOWN DISPOSABLE) ×1 IMPLANT
GOWN STRL REUS W/TWL LRG LVL3 (GOWN DISPOSABLE) ×1
GOWN STRL REUS W/TWL XL LVL3 (GOWN DISPOSABLE) ×6 IMPLANT
HEMOSTAT SPONGE AVITENE ULTRA (HEMOSTASIS) ×2 IMPLANT
KIT BASIN OR (CUSTOM PROCEDURE TRAY) ×2 IMPLANT
KIT POSITIONING SURG ANDREWS (MISCELLANEOUS) ×2 IMPLANT
MANIFOLD NEPTUNE II (INSTRUMENTS) ×2 IMPLANT
MARKER SKIN DUAL TIP RULER LAB (MISCELLANEOUS) ×2 IMPLANT
NEEDLE HYPO 22GX1.5 SAFETY (NEEDLE) ×4 IMPLANT
NEEDLE SPNL 18GX3.5 QUINCKE PK (NEEDLE) ×4 IMPLANT
PACK LAMINECTOMY ORTHO (CUSTOM PROCEDURE TRAY) ×2 IMPLANT
PATTIES SURGICAL .5 X.5 (GAUZE/BANDAGES/DRESSINGS) ×2 IMPLANT
PATTIES SURGICAL .75X.75 (GAUZE/BANDAGES/DRESSINGS) ×2 IMPLANT
PATTIES SURGICAL 1X1 (DISPOSABLE) IMPLANT
PIN SAFETY NICK PLATE  2 MED (MISCELLANEOUS)
PIN SAFETY NICK PLATE 2 MED (MISCELLANEOUS) IMPLANT
SPONGE LAP 4X18 X RAY DECT (DISPOSABLE) ×4 IMPLANT
STAPLER VISISTAT 35W (STAPLE) ×2 IMPLANT
SUT VIC AB 1 CT1 27 (SUTURE) ×3
SUT VIC AB 1 CT1 27XBRD ANTBC (SUTURE) ×3 IMPLANT
SUT VIC AB 2-0 CT1 27 (SUTURE) ×3
SUT VIC AB 2-0 CT1 TAPERPNT 27 (SUTURE) ×3 IMPLANT
SYR 20CC LL (SYRINGE) ×4 IMPLANT
TOWEL OR 17X26 10 PK STRL BLUE (TOWEL DISPOSABLE) ×2 IMPLANT

## 2018-03-07 NOTE — Anesthesia Preprocedure Evaluation (Addendum)
Anesthesia Evaluation  Patient identified by MRN, date of birth, ID band Patient awake    Reviewed: Allergy & Precautions, NPO status , Patient's Chart, lab work & pertinent test results  Airway Mallampati: II  TM Distance: >3 FB Neck ROM: Full    Dental no notable dental hx. (+) Dental Advisory Given, Missing, Chipped   Pulmonary neg pulmonary ROS, former smoker,    Pulmonary exam normal breath sounds clear to auscultation       Cardiovascular hypertension, Normal cardiovascular exam Rhythm:Regular Rate:Normal     Neuro/Psych Seizures -, Well Controlled,  negative psych ROS   GI/Hepatic Neg liver ROS, GERD  Medicated,  Endo/Other  negative endocrine ROS  Renal/GU Renal InsufficiencyRenal disease  negative genitourinary   Musculoskeletal negative musculoskeletal ROS (+)   Abdominal   Peds negative pediatric ROS (+)  Hematology negative hematology ROS (+)   Anesthesia Other Findings   Reproductive/Obstetrics negative OB ROS                            Anesthesia Physical Anesthesia Plan  ASA: III  Anesthesia Plan: General   Post-op Pain Management:    Induction: Intravenous  PONV Risk Score and Plan: 3 and Ondansetron, Dexamethasone and Treatment may vary due to age or medical condition  Airway Management Planned: Oral ETT  Additional Equipment:   Intra-op Plan:   Post-operative Plan: Extubation in OR  Informed Consent: I have reviewed the patients History and Physical, chart, labs and discussed the procedure including the risks, benefits and alternatives for the proposed anesthesia with the patient or authorized representative who has indicated his/her understanding and acceptance.   Dental advisory given  Plan Discussed with: CRNA and Surgeon  Anesthesia Plan Comments:         Anesthesia Quick Evaluation

## 2018-03-07 NOTE — Brief Op Note (Signed)
03/07/2018  12:56 PM  PATIENT:  [name]  67 y.o. female  PRE-OPERATIVE DIAGNOSIS: Same as Post-Op Diagnosis.          POST-OPERATIVE DIAGNosis:Prior Lumbar Laminectomy and Microdiscectomy at L-4-L-5 on the right.Severe Spinal Stenosis at L-4-L-5.Foraminal Stenosis involving the L-4 and L-5 Nerve Roots on the Right.Pre and Post Op Diagnosis the same.Morbid Obesity.  PROCEDURE:  Complete Decompressive Lumbar Laminectomy at L-4-L-5 for Spinal Stenosis and Foraminotomies for the L-4 and L-5 nerve roots on the right. SURGEON:  Surgeon(s) and Role:    * Latanya Maudlin, MD - Primary  PHYSICIAN ASSISTANT: Ardeen Jourdain PA  ASSISTANTS: Ardeen Jourdain PA   ANESTHESIA:   general  EBL:  100 mL   BLOOD ADMINISTERED:none  DRAINS: none   LOCAL MEDICATIONS USED:  MARCAINE 20cc of 0.50% with Epinephrine at the start of the case and 20cc of Exparel at the end of the Case.      SPECIMEN:  No Specimen  DISPOSITION OF SPECIMEN:  N/A  COUNTS:  YES  TOURNIQUET:  * No tourniquets in log *  DICTATION: .Other Dictation: Dictation Number  unkown PLAN OF CARE: Admit for overnight observation  PATIENT DISPOSITION:  PACU - hemodynamically stable.   Delay start of Pharmacological VTE agent (>24hrs) due to surgical blood loss or risk of bleeding: yes

## 2018-03-07 NOTE — Anesthesia Procedure Notes (Signed)
Anesthesia Procedure Image    

## 2018-03-07 NOTE — Progress Notes (Signed)
Pharmacy Antibiotic Note  [name] is a 67 y.o. female admitted on 03/07/2018 with lumbar laminectomy w/ microdiscectomy.    Pharmacy has been consulted for vancomycin dosing x 1 post op.  Does not appear to have a drain in place.    Plan:  Based on reduced kidney function,  Give Vancomycin 1gm x1 tomorrow am  No further dosing or monitoring so will sign off.    Height: 5\' 7"  (170.2 cm) Weight: 204 lb (92.5 kg) IBW/kg (Calculated) : 61.6  Temp (24hrs), Avg:97.9 F (36.6 C), Min:97.6 F (36.4 C), Max:98.2 F (36.8 C)  No results for input(s): WBC, CREATININE, LATICACIDVEN, VANCOTROUGH, VANCOPEAK, VANCORANDOM, GENTTROUGH, GENTPEAK, GENTRANDOM, TOBRATROUGH, TOBRAPEAK, TOBRARND, AMIKACINPEAK, AMIKACINTROU, AMIKACIN in the last 168 hours.  Estimated Creatinine Clearance: 26.1 mL/min (A) (by C-G formula based on SCr of 2.48 mg/dL (H)).    Allergies  Allergen Reactions  . Penicillins Hives    Has patient had a PCN reaction causing immediate rash, facial/tongue/throat swelling, SOB or lightheadedness with hypotension: Yes Has patient had a PCN reaction causing severe rash involving mucus membranes or skin necrosis: Yes-rash/hives Has patient had a PCN reaction that required hospitalization No Has patient had a PCN reaction occurring within the last 10 years: Yes If all of the above answers are "NO", then may proceed with Cephalosporin use.   . Sulfonamide Derivatives Hives  . Asa [Aspirin] Itching    High dose Aspirin causes itching but can take 81mg    . Rofecoxib Rash    Reaction to VIOXX    Thank you for allowing pharmacy to be a part of this patient's care.  Doreene Eland, PharmD, BCPS.   Pager: [phone] 03/07/2018 4:22 PM

## 2018-03-07 NOTE — Anesthesia Procedure Notes (Signed)
Central Venous Catheter Insertion Performed by: Myrtie Soman, MD, anesthesiologist Start/End4/08/2018 10:42 AM, 03/07/2018 10:52 AM Patient location: Pre-op. Preanesthetic checklist: patient identified, IV checked, site marked, risks and benefits discussed, surgical consent, monitors and equipment checked, pre-op evaluation, timeout performed and anesthesia consent Position: Trendelenburg Lidocaine 1% used for infiltration and patient sedated Hand hygiene performed , maximum sterile barriers used  and Seldinger technique used Catheter size: 8 Fr Total catheter length 16. Central line was placed.Double lumen Procedure performed using ultrasound guided technique. Ultrasound Notes:anatomy identified, needle tip was noted to be adjacent to the nerve/plexus identified, no ultrasound evidence of intravascular and/or intraneural injection and image(s) printed for medical record Attempts: 1 Following insertion, dressing applied, line sutured and Biopatch. Post procedure assessment: blood return through all ports  Patient tolerated the procedure well with no immediate complications.

## 2018-03-07 NOTE — Progress Notes (Signed)
Report received from [place], RN at this time and care assumed for this patient at this time. VS and orders assessed and pt is alert, calm and talkative and has no s/s of distress. Will continue to monitor and tx pt according to MD orders. Pt denies additional complaints at this time. Xray is at the bedside.

## 2018-03-07 NOTE — Discharge Instructions (Signed)

## 2018-03-07 NOTE — Anesthesia Procedure Notes (Signed)
Procedure Name: Intubation Date/Time: 03/07/2018 11:17 AM Performed by: Cynda Familia, CRNA Pre-anesthesia Checklist: Patient identified, Emergency Drugs available, Suction available and Patient being monitored Patient Re-evaluated:Patient Re-evaluated prior to induction Oxygen Delivery Method: Circle System Utilized Preoxygenation: Pre-oxygenation with 100% oxygen Induction Type: IV induction Ventilation: Mask ventilation without difficulty Laryngoscope Size: Miller and 2 Grade View: Grade I Tube type: Oral Tube size: 7.0 mm Number of attempts: 1 Airway Equipment and Method: Stylet Placement Confirmation: ETT inserted through vocal cords under direct vision,  positive ETCO2 and breath sounds checked- equal and bilateral Secured at: 21 cm Tube secured with: Tape Dental Injury: Teeth and Oropharynx as per pre-operative assessment  Comments: Smooth IV induction Rose-- intubation AM CRNA atraumatic-- front teeth- chipped- missing teeth -- mouth and teeth unchanged after laryngoscopy-- bilat BS Rose

## 2018-03-07 NOTE — Progress Notes (Signed)
Assisted Dr. Kalman Shan with central line insertion right side.Side rails up, monitors on throughout procedure. See vital signs in flow sheet. Tolerated Procedure well.

## 2018-03-07 NOTE — Anesthesia Procedure Notes (Signed)
Date/Time: 03/07/2018 1:15 PM Performed by: Cynda Familia, CRNA Oxygen Delivery Method: Simple face mask Placement Confirmation: positive ETCO2 and breath sounds checked- equal and bilateral

## 2018-03-07 NOTE — Transfer of Care (Signed)
Immediate Anesthesia Transfer of Care Note  Patient: [name]  Procedure(s) Performed: Central decompression, microdisectomy L4-L5 right (N/A )  Patient Location: PACU  Anesthesia Type:General  Level of Consciousness: awake and alert   Airway & Oxygen Therapy: Patient Spontanous Breathing and Patient connected to face mask oxygen  Post-op Assessment: Report given to RN and Post -op Vital signs reviewed and stable  Post vital signs: Reviewed and stable  Last Vitals:  Vitals Value Taken Time  BP 102/33 03/07/2018  1:22 PM  Temp    Pulse 85 03/07/2018  1:28 PM  Resp 20 03/07/2018  1:30 PM  SpO2 99 % 03/07/2018  1:28 PM  Vitals shown include unvalidated device data.  Last Pain:  Vitals:   03/07/18 0905  TempSrc: Oral         Complications: No apparent anesthesia complications

## 2018-03-07 NOTE — Interval H&P Note (Signed)
History and Physical Interval Note:  03/07/2018 10:59 AM  [name]  has presented today for surgery, with the diagnosis of Lumbar spinal stenosis, L4-L5 herniated nucleus pulposus  The various methods of treatment have been discussed with the patient and family. After consideration of risks, benefits and other options for treatment, the patient has consented to  Procedure(s): Central decompression, microdisectomy L4-L5 right (N/A) as a surgical intervention .  The patient's history has been reviewed, patient examined, no change in status, stable for surgery.  I have reviewed the patient's chart and labs.  Questions were answered to the patient's satisfaction.     Latanya Maudlin

## 2018-03-08 DIAGNOSIS — M48062 Spinal stenosis, lumbar region with neurogenic claudication: Secondary | ICD-10-CM | POA: Diagnosis not present

## 2018-03-08 NOTE — Evaluation (Signed)
Physical Therapy Evaluation Patient Details Name: [name] MRN: [id number] DOB: [birth year] Today's Date: 03/08/2018   History of Present Illness  67 yo female s/p L4-L5 laminectomy 03/07/18. Hx of previous back sg, Sz.   Clinical Impression  On eval, pt was Min guard assist for mobility. She walked ~100 feet with a RW. Pain rated 7/10 with activity. Verbally reviewed and demonstrated back precautions, logroll technique, car transfer technique. All education completed. Okay to d/c from PT standpoint.     Follow Up Recommendations Supervision for mobility/OOB    Equipment Recommendations  None recommended by PT    Recommendations for Other Services       Precautions / Restrictions Precautions Precautions: Back Precaution Comments: Reviewed back precautions and logroll technique Restrictions Weight Bearing Restrictions: No      Mobility  Bed Mobility               General bed mobility comments: oob in recliner  Transfers Overall transfer level: Needs assistance Equipment used: Rolling walker (2 wheeled) Transfers: Sit to/from Stand Sit to Stand: Min guard         General transfer comment: close guard for safety. VCS hand placement. Increased time.   Ambulation/Gait Ambulation/Gait assistance: Min guard Ambulation Distance (Feet): 100 Feet Assistive device: Rolling walker (2 wheeled) Gait Pattern/deviations: Step-through pattern;Decreased stride length     General Gait Details: slow gait speed. close guard for safety.   Stairs            Wheelchair Mobility    Modified Rankin (Stroke Patients Only)       Balance Overall balance assessment: Needs assistance         Standing balance support: Bilateral upper extremity supported Standing balance-Leahy Scale: Poor                               Pertinent Vitals/Pain Pain Assessment: 0-10 Pain Score: 7  Pain Location: back Pain Descriptors / Indicators: Aching;Sore Pain  Intervention(s): Limited activity within patient's tolerance;Monitored during session    [place] expects to be discharged to:: Private residence Living Arrangements: Children Available Help at Discharge: Family Type of Home: House Home Access: [place]: One [place]: Environmental consultant - 2 wheels;Bedside commode;Shower seat      Prior Function Level of Independence: Independent               Hand Dominance        Extremity/Trunk Assessment   Upper Extremity Assessment Upper Extremity Assessment: Defer to OT evaluation    Lower Extremity Assessment Lower Extremity Assessment: Generalized weakness    Cervical / Trunk Assessment Cervical / Trunk Assessment: Normal  Communication   Communication: No difficulties  Cognition Arousal/Alertness: Awake/alert Behavior During Therapy: WFL for tasks assessed/performed Overall Cognitive Status: Within Functional Limits for tasks assessed                                        General Comments      Exercises     Assessment/Plan    PT Assessment Patient needs continued PT services  PT Problem List Decreased balance;Decreased mobility;Decreased activity tolerance;Pain       PT Treatment Interventions DME instruction;Gait training;Functional mobility training;Therapeutic activities;Balance training;Patient/family education    PT Goals (Current goals can be found in the Care  Plan section)  Acute Rehab PT Goals Patient Stated Goal: home. less pain. PT Goal Formulation: With patient Time For Goal Achievement: 03/22/18 Potential to Achieve Goals: Good    Frequency Min 5X/week   Barriers to discharge        Co-evaluation               AM-PAC PT "6 Clicks" Daily Activity  Outcome Measure Difficulty turning over in bed (including adjusting bedclothes, sheets and blankets)?: A Lot Difficulty moving from lying on back to sitting on the side of the  bed? : A Lot Difficulty sitting down on and standing up from a chair with arms (e.g., wheelchair, bedside commode, etc,.)?: A Lot Help needed moving to and from a bed to chair (including a wheelchair)?: A Little Help needed walking in hospital room?: A Little Help needed climbing 3-5 steps with a railing? : A Lot 6 Click Score: 14    End of Session Equipment Utilized During Treatment: Gait belt Activity Tolerance: Patient tolerated treatment well Patient left: in chair;with call bell/phone within reach   PT Visit Diagnosis: Pain;Difficulty in walking, not elsewhere classified (R26.2);Unsteadiness on feet (R26.81);Other abnormalities of gait and mobility (R26.89) Pain - part of body: (back)    Time: 1030-1041 PT Time Calculation (min) (ACUTE ONLY): 11 min   Charges:   PT Evaluation $PT Eval Low Complexity: 1 Low     PT G Codes:          Weston Anna, MPT Pager: [phone]

## 2018-03-08 NOTE — Anesthesia Postprocedure Evaluation (Signed)
Anesthesia Post Note  Patient: [name]  Procedure(s) Performed: Central decompression, microdisectomy L4-L5 right (N/A )     Patient location during evaluation: PACU Anesthesia Type: General Level of consciousness: awake and alert Pain management: pain level controlled Vital Signs Assessment: post-procedure vital signs reviewed and stable Respiratory status: spontaneous breathing, nonlabored ventilation, respiratory function stable and patient connected to nasal cannula oxygen Cardiovascular status: blood pressure returned to baseline and stable Postop Assessment: no apparent nausea or vomiting Anesthetic complications: no    Last Vitals:  Vitals:   03/08/18 0234 03/08/18 0518  BP: 129/83 (!) 142/71  Pulse: 83 100  Resp: 17 17  Temp: 36.5 C 36.8 C  SpO2: 93% 99%    Last Pain:  Vitals:   03/08/18 0556  TempSrc:   PainSc: 9                  Maxamilian Amadon S

## 2018-03-08 NOTE — Op Note (Signed)
[name], [name]            ACCOUNT NO.:  [id number]  MEDICAL RECORD NO.:  [id number]  LOCATION:  [place]:  [facility]  PHYSICIAN:  Kipp Brood. Eason Housman, M.D.DATE OF BIRTH:  1951/04/22  DATE OF PROCEDURE:  03/07/2018 DATE OF DISCHARGE:                              OPERATIVE REPORT   SURGEON:  Kipp Brood. Gladstone Lighter, M.D.  ASSISTANT:  Ardeen Jourdain, [place]  PREOPERATIVE DIAGNOSES: 1. Severe spinal stenosis at L4-L5. 2. Partial footdrop on the right. 3. Previous hemilaminectomy by another surgeon at L4-5 on the right     with marked scar tissue. 4. Foraminal stenosis involving the L4 root on the right. 5. Foraminal stenosis involving the L5 nerve root on the right.  POSTOPERATIVE DIAGNOSES: 1. Severe spinal stenosis at L4-L5. 2. Partial footdrop on the right. 3. Previous hemilaminectomy by another surgeon at L4-5 on the right     with marked scar tissue. 4. Foraminal stenosis involving the L4 root on the right. 5. Foraminal stenosis involving the L5 nerve root on the right.  OPERATION: 1. Complete decompressive lumbar laminectomy at L4-5 for spinal     stenosis. 2. Foraminotomy for the L4 root on the right. 3. Foraminotomy for the L5 root on the right.  DESCRIPTION OF PROCEDURE:  Under general anesthesia, the patient on spinal frame, routine orthopedic prep and draping of the lower back was carried out.  Foley catheter was inserted.  She had 1 g of vancomycin. At this particular time, the appropriate time-out was carried out, I also marked the appropriate right side of her back in the holding area. Two needles were then were placed in the back for localization purposes. X-ray was taken.  Incision then was made over the L4-5 space.  It was extended proximally and distally in the usual fashion.  Note, she was very morbidly obese.  A great deal of time was taken to control the small venous bleeders.  I inserted the self-retaining retractors.  I then  went down and addressed the spinous process of L4.  I separated the muscle from the lamina and spinous processes at L4-L5 region.  Another x- ray was taken.  At this particular time, we then went down and inserted our [facility] retractors, and we then carried out our decompressive lumbar laminectomy in the usual fashion at L4-5.  The microscope then was brought in.  It was noted that she had a prior laminectomy defect tear at L4-5 on the right with severe scarring.  I gently released the scar from the dura.  I utilized the cottonoids to protect the dura at all time.  I extended the laminectomy distally and proximally until we were now free.  I then went down into the foramina of L4 for the L4 root and L5 for the L5 root.  I did a nice foraminotomy.  Note, the dura was extremely adhered to the lateral wall and no attempt was made to free that dura from air for fear of tearing the dura.  I did utilize the curette to remove as much bone as we possibly could from the top surface of the dura.  I thoroughly irrigated out the area.  No dural leak occurred.  Good hemostasis was maintained and loosely applied some thrombin-soaked Gelfoam after I irrigated out the wound.  I then closed the wound in layers in usual fashion except I left a small distal deep and proximal part of the wound open for drainage purposes to avoid any compression on the dural sac.  At the beginning of the procedure, I injected 20 cc of 0.25% Marcaine with epinephrine to control bleeding. At the end of the procedure, I injected 20 cc of Exparel into the soft tissue for pain control.  After the wound was closed, sterile dressings were applied.  The patient left the operating room in satisfactory condition after the Foley catheter was discontinued.          ______________________________ Kipp Brood Gladstone Lighter, M.D.     RAG/MEDQ  D:  03/07/2018  T:  03/08/2018  Job:  [id number]

## 2018-03-08 NOTE — Progress Notes (Signed)
Subjective: 1 Day Post-Op Procedure(s) (LRB): Central decompression, microdisectomy L4-L5 right (N/A) Patient reports pain as 2 on 0-10 scale. Doing very well today. Will DC   Objective: Vital signs in last 24 hours: Temp:  [97.5 F (36.4 C)-98.3 F (36.8 C)] 98.3 F (36.8 C) (04/11 0518) Pulse Rate:  [65-100] 100 (04/11 0518) Resp:  [10-38] 17 (04/11 0518) BP: (94-142)/(33-83) 142/71 (04/11 0518) SpO2:  [93 %-100 %] 99 % (04/11 0518) Weight:  [92.5 kg (204 lb)] 92.5 kg (204 lb) (04/10 0916)  Intake/Output from previous day: 04/10 0701 - 04/11 0700 In: 3716.1 [P.O.:380; I.V.:3081.1; IV Piggyback:255] Out: 800 [Urine:700; Blood:100] Intake/Output this shift: No intake/output data recorded.  No results for input(s): HGB in the last 72 hours. No results for input(s): WBC, RBC, HCT, PLT in the last 72 hours. No results for input(s): NA, K, CL, CO2, BUN, CREATININE, GLUCOSE, CALCIUM in the last 72 hours. No results for input(s): LABPT, INR in the last 72 hours.  Neurologically intact Dorsiflexion/Plantar flexion intact    Assessment/Plan: 1 Day Post-Op Procedure(s) (LRB): Central decompression, microdisectomy L4-L5 right (N/A) Up with therapy.DC    Latanya Maudlin 03/08/2018, 7:22 AM

## 2018-03-08 NOTE — Discharge Summary (Signed)
Physician Discharge Summary   Patient ID: [name] MRN: [id number] DOB/AGE: [birth year] 67 y.o.  Admit date: 03/07/2018 Discharge date: 03/08/2018  Primary Diagnosis: Lumbar spinal stenosis   Admission Diagnoses:  Past Medical History:  Diagnosis Date  . Chronic headaches   . CKD (chronic kidney disease) stage 3, GFR 30-59 ml/min (HCC)   . Depression   . Encephalopathy   . Essential hypertension   . Hyperlipidemia   . 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 Osteoporosis   . Restrictive lung disease   . Right leg weakness   . Seizures [facility]    first sz age 88, last seizure Aug 2017  . URI (upper respiratory infection)    Discharge Diagnoses:   Active Problems:   Spinal stenosis, lumbar region with neurogenic claudication  Estimated body mass index is 31.95 kg/m as calculated from the following:   Height as of this encounter: '5\' 7"'$  (1.702 m).   Weight as of this encounter: 92.5 kg (204 lb).  Procedure:  Procedure(s) (LRB): Central decompression, microdisectomy L4-L5 right (N/A)   Consults: None  HPI: The patient is a 67 year old female who presented to the office with the chief complaint of low back pain. She has had increased pain in her low back as well as her right LE over the past few months. She had lumbar surgery in 2002. She developed weakness in her right LE. CT myelogram showed a severe block at L4-L5 as well as a disc herniation at L4-L5 right.     Laboratory Data: Hospital Outpatient Visit on 02/28/2018  Component Date Value Ref Range Status  . aPTT 02/28/2018 46* 24 - 36 seconds Final   Comment:        IF BASELINE aPTT IS ELEVATED, SUGGEST PATIENT RISK ASSESSMENT BE USED TO DETERMINE APPROPRIATE ANTICOAGULANT THERAPY. Performed at [facility], [place] [address] [place], [place] [address]   . Sodium 02/28/2018 138  135 - 145 mmol/L Final  . Potassium 02/28/2018 5.7* 3.5 - 5.1 mmol/L Final  . Chloride  02/28/2018 107  101 - 111 mmol/L Final  . CO2 02/28/2018 22  22 - 32 mmol/L Final  . Glucose, Bld 02/28/2018 83  65 - 99 mg/dL Final  . BUN 02/28/2018 36* 6 - 20 mg/dL Final  . Creatinine, Ser 02/28/2018 2.48* 0.44 - 1.00 mg/dL Final  . Calcium 02/28/2018 9.0  8.9 - 10.3 mg/dL Final  . Total Protein 02/28/2018 7.9  6.5 - 8.1 g/dL Final  . Albumin 02/28/2018 4.1  3.5 - 5.0 g/dL Final  . AST 02/28/2018 24  15 - 41 U/L Final  . ALT 02/28/2018 20  14 - 54 U/L Final  . Alkaline Phosphatase 02/28/2018 163* 38 - 126 U/L Final  . Total Bilirubin 02/28/2018 0.3  0.3 - 1.2 mg/dL Final  . GFR calc non Af Amer 02/28/2018 19* >60 mL/min Final  . GFR calc Af Amer 02/28/2018 22* >60 mL/min Final   Comment: (NOTE) The eGFR has been calculated using the CKD EPI equation. This calculation has not been validated in all clinical situations. eGFR's persistently <60 mL/min signify possible Chronic Kidney Disease.   Georgiann Hahn gap 02/28/2018 9  5 - 15 Final   Performed at [facility], [place] [address] [place], [place] [address]  . Prothrombin Time 02/28/2018 14.8  11.4 - 15.2 seconds Final  . INR 02/28/2018 1.17   Final   Performed at [facility], [place]  [address], [place], [place] [address]  . ABO/RH(D) 02/28/2018 O POS   Final  . Antibody Screen 02/28/2018 NEG   Final  . Sample Expiration 02/28/2018 03/10/2018   Final  . Extend sample reason 02/28/2018    Final                   Value:NO TRANSFUSIONS OR PREGNANCY IN THE PAST 3 MONTHS Performed at [facility], [place] [address], [place], [place] [address]   . Color, Urine 02/28/2018 YELLOW  YELLOW Final  . APPearance 02/28/2018 CLOUDY* CLEAR Final  . Specific Gravity, Urine 02/28/2018 1.020  1.005 - 1.030 Final  . pH 02/28/2018 5.0  5.0 - 8.0 Final  . Glucose, UA 02/28/2018 NEGATIVE  NEGATIVE mg/dL Final  . Hgb urine dipstick 02/28/2018 NEGATIVE  NEGATIVE Final  . Bilirubin Urine 02/28/2018 NEGATIVE   NEGATIVE Final  . Ketones, ur 02/28/2018 NEGATIVE  NEGATIVE mg/dL Final  . Protein, ur 02/28/2018 100* NEGATIVE mg/dL Final  . Nitrite 02/28/2018 NEGATIVE  NEGATIVE Final  . Leukocytes, UA 02/28/2018 LARGE* NEGATIVE Final  . RBC / HPF 02/28/2018 0-5  0 - 5 RBC/hpf Final  . WBC, UA 02/28/2018 6-30  0 - 5 WBC/hpf Final  . Bacteria, UA 02/28/2018 RARE* NONE SEEN Final  . Squamous Epithelial / LPF 02/28/2018 6-30* NONE SEEN Final  . Mucus 02/28/2018 PRESENT   Final  . Hyaline Casts, UA 02/28/2018 PRESENT   Final   Performed at [facility], [place] [address] [place], [place] [address]  . MRSA, PCR 02/28/2018 POSITIVE* NEGATIVE Final   Comment: RESULT CALLED TO, READ BACK BY AND VERIFIED WITH: Luke [id number] [email or web address]   . Staphylococcus aureus 02/28/2018 POSITIVE* NEGATIVE Final   Comment: (NOTE) The Xpert SA Assay (FDA approved for NASAL specimens in patients 52 years of age and older), is one component of a comprehensive surveillance program. It is not intended to diagnose infection nor to guide or monitor treatment. Performed at [facility], [place] [address], [place], [place] [address]   . ABO/RH(D) 02/28/2018    Final                   Value:O POS Performed at [facility], [place] [address] [place],  [address]   Appointment on 02/26/2018  Component Date Value Ref Range Status  . WBC 02/26/2018 6.3  4.0 - 10.5 K/uL Final  . RBC 02/26/2018 4.66  3.87 - 5.11 MIL/uL Final  . Hemoglobin 02/26/2018 14.0  12.0 - 15.0 g/dL Final  . HCT 02/26/2018 45.4  36.0 - 46.0 % Final  . MCV 02/26/2018 97.4  78.0 - 100.0 fL Final  . MCH 02/26/2018 30.0  26.0 - 34.0 pg Final  . MCHC 02/26/2018 30.8  30.0 - 36.0 g/dL Final  . RDW 02/26/2018 12.2  11.5 - 15.5 % Final  . Platelets 02/26/2018 360  150 - 400 K/uL Final  . Neutrophils Relative % 02/26/2018 59  % Final  . Neutro Abs 02/26/2018 3.7  1.7 - 7.7 K/uL Final  . Lymphocytes  Relative 02/26/2018 29  % Final  . Lymphs Abs 02/26/2018 1.8  0.7 - 4.0 K/uL Final  . Monocytes Relative 02/26/2018 6  % Final  . Monocytes Absolute 02/26/2018 0.4  0.1 - 1.0 K/uL Final  . Eosinophils Relative 02/26/2018 5  % Final  . Eosinophils Absolute 02/26/2018 0.3  0.0 - 0.7 K/uL Final  . Basophils Relative 02/26/2018 1  % Final  .  Basophils Absolute 02/26/2018 0.1  0.0 - 0.1 K/uL Final   Performed at [facility], [address], [place], [place] [address]  . Sodium 02/26/2018 135  135 - 145 mmol/L Final  . Potassium 02/26/2018 4.9  3.5 - 5.1 mmol/L Final  . Chloride 02/26/2018 104  101 - 111 mmol/L Final  . CO2 02/26/2018 20* 22 - 32 mmol/L Final  . Glucose, Bld 02/26/2018 90  65 - 99 mg/dL Final  . BUN 02/26/2018 33* 6 - 20 mg/dL Final  . Creatinine, Ser 02/26/2018 2.18* 0.44 - 1.00 mg/dL Final  . Calcium 02/26/2018 9.5  8.9 - 10.3 mg/dL Final  . Total Protein 02/26/2018 8.3* 6.5 - 8.1 g/dL Final  . Albumin 02/26/2018 4.2  3.5 - 5.0 g/dL Final  . AST 02/26/2018 22  15 - 41 U/L Final  . ALT 02/26/2018 20  14 - 54 U/L Final  . Alkaline Phosphatase 02/26/2018 170* 38 - 126 U/L Final  . Total Bilirubin 02/26/2018 0.7  0.3 - 1.2 mg/dL Final  . GFR calc non Af Amer 02/26/2018 22* >60 mL/min Final  . GFR calc Af Amer 02/26/2018 26* >60 mL/min Final   Comment: (NOTE) The eGFR has been calculated using the CKD EPI equation. This calculation has not been validated in all clinical situations. eGFR's persistently <60 mL/min signify possible Chronic Kidney Disease.   Georgiann Hahn gap 02/26/2018 11  5 - 15 Final   Performed at [facility], [address], [place], [place] [address]  . IgG (Immunoglobin G), Serum 02/26/2018 1,610* 700 - 1,600 mg/dL Final  . IgA 02/26/2018 324  87 - 352 mg/dL Final  . IgM (Immunoglobulin M), Srm 02/26/2018 228* 26 - 217 mg/dL Final  . Total Protein ELP 02/26/2018 7.5  6.0 - 8.5 g/dL Corrected  . Albumin SerPl Elph-Mcnc 02/26/2018 3.5  2.9 - 4.4 g/dL Corrected   . Alpha 1 02/26/2018 0.2  0.0 - 0.4 g/dL Corrected  . Alpha2 Glob SerPl Elph-Mcnc 02/26/2018 0.9  0.4 - 1.0 g/dL Corrected  . B-Globulin SerPl Elph-Mcnc 02/26/2018 1.2  0.7 - 1.3 g/dL Corrected  . Gamma Glob SerPl Elph-Mcnc 02/26/2018 1.7  0.4 - 1.8 g/dL Corrected  . M Protein SerPl Elph-Mcnc 02/26/2018 0.5* Not Observed g/dL Corrected  . Globulin, Total 02/26/2018 4.0* 2.2 - 3.9 g/dL Corrected  . Albumin/Glob SerPl 02/26/2018 0.9  0.7 - 1.7 Corrected  . IFE 1 02/26/2018 Comment   Corrected   Comment: (NOTE) Immunofixation shows IgG monoclonal protein with kappa light chain specificity. Please note that samples from patients receiving DARZALEX(R) (daratumumab) treatment can appear as an "IgG kappa" and mask a complete response. If this patient is receiving DARA, this IFE assay interference can be removed by ordering test number [id number]-"Immunofixation, Daratumumab-Specific, Serum" and submitting a new sample for testing or by calling the lab to add this test to the current sample. An apparent polyclonal gammopathy: IgM. Kappa and lambda typing appear increased.   . Please Note 02/26/2018 Comment   Corrected   Comment: (NOTE) Protein electrophoresis scan will follow via computer, mail, or courier delivery. Performed At: [facility] [place], [place] [id number] Rush Farmer MD JG:[id number] Performed at [facility], [address], [place],  [address]   . Kappa free light chain 02/26/2018 101.7* 3.3 - 19.4 mg/L Final  . Lamda free light chains 02/26/2018 36.4* 5.7 - 26.3 mg/L Final  . Kappa, lamda light chain ratio 02/26/2018 2.79* 0.26 - 1.65 Final   Comment: (NOTE) Performed  At: [facility] [place], [place] [id number] Rush Farmer MD ST:[id number] Performed at [facility], [address], [place], [place] [address]   . Beta-2 Microglobulin 02/26/2018 5.5* 0.6 - 2.4 mg/L Final   Comment: (NOTE) Siemens Immulite 2000  Immunochemiluminometric assay (ICMA) Values obtained with different assay methods or kits cannot be used interchangeably. Results cannot be interpreted as absolute evidence of the presence or absence of malignant disease. Performed At: [facility] [place], [place] [id number] Rush Farmer MD GY:[id number] Performed at [facility], [address] [place], [place] [address]      X-Rays:X-ray Chest Pa Or Ap  Result Date: 03/07/2018 CLINICAL DATA:  Status post central line placement EXAM: CHEST  1 VIEW COMPARISON:  Chest x-ray of February 28, 2018 FINDINGS: The lung volumes are low. The right hemidiaphragm remains increased. The right internal jugular venous catheter tip projects over the midportion of the SVC. There is no postprocedure pneumothorax. There is no mediastinal shift. The left hemidiaphragm is less well demonstrated today. The cardiac silhouette remains enlarged. IMPRESSION: No immediate postprocedure complication following right internal jugular venous catheter placement. Subsegmental atelectasis medially in the right upper lobe. Overall right-sided hypoinflation at least in part due to the elevated right hemidiaphragm. Electronically Signed   By: David  Martinique M.D.   On: 03/07/2018 13:49   Dg Chest 2 View  Result Date: 02/28/2018 CLINICAL DATA:  Preop for lumbar surgery. EXAM: CHEST - 2 VIEW COMPARISON:  Radiograph of August 28, 2017. FINDINGS: The heart size and mediastinal contours are within normal limits. No pneumothorax or pleural effusion is noted. Left lung is clear. Mild right basilar subsegmental active noted secondary to elevated right hemidiaphragm. The visualized skeletal structures are unremarkable. IMPRESSION: Stable elevated right hemidiaphragm is noted with associated mild subsegmental atelectasis. No significant change compared to prior exam. Electronically Signed   By: Marijo Conception, M.D.   On: 02/28/2018 17:05   Dg Lumbar Spine 2-3  Views  Result Date: 02/28/2018 CLINICAL DATA:  Preop for lumbar surgery. EXAM: LUMBAR SPINE - 2-3 VIEW COMPARISON:  CT scan of February 05, 2018. FINDINGS: Mild dextroscoliosis of lower thoracic and upper lumbar spine is noted. No fracture or spondylolisthesis is noted. Moderate degenerative disc disease is noted at L1-2, L2-3 and L3-4. IMPRESSION: Multilevel degenerative disc disease. No acute abnormality seen in the lumbar spine. Electronically Signed   By: Marijo Conception, M.D.   On: 02/28/2018 17:07   Dg Spine Portable 1 View  Result Date: 03/07/2018 CLINICAL DATA:  Laminectomy EXAM: PORTABLE SPINE - 1 VIEW COMPARISON:  Study obtained earlier in the day FINDINGS: Cross-table lateral lumbar image labeled labeled #3 submitted. Metallic probe tip is posterior to the inferior aspect of the L4 vertebral body. There is disc space narrowing at L2-3, L3-4, L4-5, and L5-S1. No fracture or spondylolisthesis. IMPRESSION: Metallic probe tip posterior to the inferior aspect of the L4 vertebral body. Multilevel osteoarthritic change noted. Electronically Signed   By: Lowella Grip III M.D.   On: 03/07/2018 12:32   Dg Spine Portable 1 View  Result Date: 03/07/2018 CLINICAL DATA:  Intraoperative localization. EXAM: PORTABLE SPINE - 1 VIEW COMPARISON:  Lumbar spine x-rays dated February 28, 2018. FINDINGS: Single intraoperative lateral view of the lumbar spine. Surgical instrumentation is noted over the L4 spinous process at the level of the L4-L5 disc space. IMPRESSION: Intraoperative localization as described above. Electronically Signed   By: Titus Dubin M.D.   On: 03/07/2018 12:04  Dg Spine Portable 1 View  Result Date: 03/07/2018 CLINICAL DATA:  Intraoperative; please number vertebral bodies EXAM: PORTABLE SPINE - 1 VIEW COMPARISON:  Plain film the lumbar spine dated 02/28/2018. FINDINGS: Single intraoperative lateral view of the lumbar spine is provided. Based on the numbering provided on the earlier plain  film examination, surgical probes are directed at the spinous processes of the L3 and L4 vertebral bodies. IMPRESSION: Based on numbering provided on the earlier plain film examination of 02/28/2018, surgical hardware is directed towards the L3 and L4 spinous processes. Electronically Signed   By: Franki Cabot M.D.   On: 03/07/2018 11:59    EKG: Orders placed or performed during the hospital encounter of 02/28/18  . EKG  . EKG     Hospital Course: [name] is a 67 y.o. who was admitted to [facility]. They were brought to the operating room on 03/07/2018 and underwent Procedure(s): Central decompression, microdisectomy L4-L5 right.  Patient tolerated the procedure well and was later transferred to the recovery room and then to the orthopaedic floor for postoperative care.  They were given PO and IV analgesics for pain control following their surgery.  They were given 24 hours of postoperative antibiotics of  Anti-infectives (From admission, onward)   Start     Dose/Rate Route Frequency Ordered Stop   03/08/18 0400  vancomycin (VANCOCIN) IVPB 1000 mg/200 mL premix     1,000 mg 200 mL/hr over 60 Minutes Intravenous  Once 03/07/18 1623 03/08/18 0549   03/07/18 1203  polymyxin B 500,000 Units, bacitracin 50,000 Units in sodium chloride 0.9 % 500 mL irrigation  Status:  Discontinued       As needed 03/07/18 1203 03/07/18 1317   03/07/18 0846  vancomycin (VANCOCIN) IVPB 1000 mg/200 mL premix     1,000 mg 200 mL/hr over 60 Minutes Intravenous On call to O.R. 03/07/18 3664 03/07/18 1155     and started on DVT prophylaxis in the form of Aspirin.   PT and OT were ordered.  Discharge planning consulted to help with postop disposition and equipment needs.  Patient had a good night on the evening of surgery.  They started to get up OOB with therapy on day one Dressing was changed and the incision was clean and dry.  Patient was seen in rounds and was ready to go home.   Diet: Cardiac  diet Activity:WBAT Follow-up:in 2 weeks Disposition - Home Discharged Condition: stable   Discharge Instructions    Call MD / Call 911   Complete by:  As directed    If you experience chest pain or shortness of breath, CALL 911 and be transported to the hospital emergency room.  If you develope a fever above 101 F, pus (white drainage) or increased drainage or redness at the wound, or calf pain, call your surgeon's office.   Constipation Prevention   Complete by:  As directed    Drink plenty of fluids.  Prune juice may be helpful.  You may use a stool softener, such as Colace (over the counter) 100 mg twice a day.  Use MiraLax (over the counter) for constipation as needed.   Diet - low sodium heart healthy   Complete by:  As directed    Discharge instructions   Complete by:  As directed    For the first three days, remove your dressing, and tape a piece of saran wrap over your incision Take your shower, then remove the saran wrap and put a  clean dressing on. After three days you can shower without the saran wrap.  No lifting or excessive bending No driving while taking pain medications Call Dr. Gladstone Lighter if any wound complications or temperature of 101 degrees F or over.  Call the office for an appointment to see Dr. Gladstone Lighter in two weeks: [phone] and ask for Dr. Charlestine Night nurse, Brunilda Payor.   Increase activity slowly as tolerated   Complete by:  As directed      Allergies as of 03/08/2018      Reactions   Penicillins Hives   Has patient had a PCN reaction causing immediate rash, facial/tongue/throat swelling, SOB or lightheadedness with hypotension: Yes Has patient had a PCN reaction causing severe rash involving mucus membranes or skin necrosis: Yes-rash/hives Has patient had a PCN reaction that required hospitalization No Has patient had a PCN reaction occurring within the last 10 years: Yes If all of the above answers are "NO", then may proceed with Cephalosporin use.    Sulfonamide Derivatives Hives   Asa [aspirin] Itching   High dose Aspirin causes itching but can take 53m    Rofecoxib Rash   Reaction to VIOXX      Medication List    STOP taking these medications   fluticasone 50 MCG/ACT nasal spray Commonly known as:  FLONASE     TAKE these medications   acetaminophen 500 MG tablet Commonly known as:  TYLENOL Take 1,000 mg by mouth 2 (two) times daily as needed for moderate pain.   Botulinum Toxin Type A 200 units Solr Commonly known as:  BOTOX Inject 200 Units as directed every 3 (three) months.   busPIRone 5 MG tablet Commonly known as:  BUSPAR Take 1 tablet (5 mg total) by mouth 3 (three) times daily.   cholecalciferol 1000 units tablet Commonly known as:  VITAMIN D Take 1,000 Units by mouth daily.   gabapentin 600 MG tablet Commonly known as:  NEURONTIN Take 1 tablet (600 mg total) by mouth 2 (two) times daily.   hydrALAZINE 50 MG tablet Commonly known as:  APRESOLINE Take 1 tablet (50 mg total) by mouth 3 times daily.   hydrOXYzine 10 MG tablet Commonly known as:  ATARAX/VISTARIL Take 10 mg by mouth 3 (three) times daily as needed for itching.   Ipratropium-Albuterol 20-100 MCG/ACT Aers respimat Commonly known as:  COMBIVENT RESPIMAT INHALE 2 PUFFS INTO THE LUNGS TWICE A DAY What changed:    how much to take  how to take this  when to take this  reasons to take this  additional instructions   levETIRAcetam 500 MG tablet Commonly known as:  KEPPRA Take 1 tablet (500 mg total) by mouth 2 (two) times daily.   methocarbamol 500 MG tablet Commonly known as:  ROBAXIN Take 1 tablet (500 mg total) by mouth every 6 (six) hours as needed for muscle spasms.   oxyCODONE-acetaminophen 5-325 MG tablet Commonly known as:  PERCOCET Take 1 tablet by mouth every 4 (four) hours as needed for up to 7 days for moderate pain. What changed:  See the new instructions.   pantoprazole 40 MG tablet Commonly known as:   PROTONIX Take 1 tablet (40 mg total) by mouth daily.   pravastatin 40 MG tablet Commonly known as:  PRAVACHOL Take 40 mg by mouth at bedtime.   topiramate 15 MG capsule Commonly known as:  TOPAMAX Take 2 capsules (30 mg total) by mouth 2 (two) times daily.   traZODone 50 MG tablet Commonly known as:  DESYREL Take ONE-HALF (0.5) TO ONE (1) tablet (25-50 mg total) by mouth at bedtime as needed for sleep. What changed:    how much to take  how to take this  when to take this  additional instructions   venlafaxine 100 MG tablet Commonly known as:  EFFEXOR Take 1 tablet (100 mg total) by mouth 2 (two) times daily.   zolpidem 5 MG tablet Commonly known as:  AMBIEN Take 1 tablet (5 mg total) by mouth at bedtime as needed for sleep.      Follow-up Information    Latanya Maudlin, MD. Schedule an appointment as soon as possible for a visit in 2 week(s).   Specialty:  Orthopedic Surgery Contact information: [address] [place] [address] [phone]           Signed: Ardeen Jourdain, PA-C Orthopaedic Surgery 03/08/2018, 3:17 PM

## 2018-03-08 NOTE — Progress Notes (Signed)
Pt discharged to home with family.  Left with all belongings, prescriptions, and equipment.  Provided discharge instructions; pt verbalized understanding.  Pt transported to vehicle via wheelchair with staff member.

## 2018-03-08 NOTE — Evaluation (Signed)
Occupational Therapy Evaluation Patient Details Name: [name] MRN: [id number] DOB: 25-Sep-1951 Today's Date: 03/08/2018    History of Present Illness 67 yo female s/p L4-L5 laminectomy 03/07/18. Hx of previous back sg, Sz.    Clinical Impression   OT education complete regarding ADL activity and back precautions.     Follow Up Recommendations  No OT follow up    Equipment Recommendations  None recommended by OT    Recommendations for Other Services       Precautions / Restrictions Precautions Precautions: Back Precaution Comments: Reviewed back precautions and logroll technique Restrictions Weight Bearing Restrictions: No      Mobility Bed Mobility               General bed mobility comments: pt sitting EOB with CNA  Transfers Overall transfer level: Needs assistance Equipment used: Rolling walker (2 wheeled) Transfers: Sit to/from [facility] Sit to Stand: Min guard Stand pivot transfers: Min guard       General transfer comment: close guard for safety. VCS hand placement. Increased time.     Balance Overall balance assessment: Needs assistance         Standing balance support: Bilateral upper extremity supported Standing balance-Leahy Scale: Poor                             ADL either performed or assessed with clinical judgement   ADL Overall ADL's : Needs assistance/impaired Eating/Feeding: Set up;Sitting   Grooming: Standing;Min guard   Upper Body Bathing: Set up;Sitting   Lower Body Bathing: Minimal assistance;Sit to/from stand;Adhering to back precautions Lower Body Bathing Details (indicate cue type and reason): family will A Upper Body Dressing : Set up;Sitting   Lower Body Dressing: Minimal assistance;Sit to/from stand;Cueing for safety;Cueing for sequencing;Cueing for compensatory techniques;With adaptive equipment;Adhering to back precautions   Toilet Transfer: Min guard;RW;Comfort height  toilet;Ambulation;Cueing for safety   Toileting- Clothing Manipulation and Hygiene: Min guard;Cueing for sequencing;Adhering to back precautions   Tub/ Shower Transfer: Walk-in shower;Adhering to back precautions;Min guard     General ADL Comments: family will A as needed.  Pt shared with OT obtaining a reacher would b challenging.  OT issued pt a reacher to ensure she would be able to follow back precautions     Vision Patient Visual Report: No change from baseline              Pertinent Vitals/Pain Pain Assessment: 0-10 Pain Score: 5  Pain Location: back Pain Descriptors / Indicators: Aching;Sore Pain Intervention(s): Limited activity within patient's tolerance;Monitored during session     Hand Dominance     Extremity/Trunk Assessment Upper Extremity Assessment Upper Extremity Assessment: Generalized weakness   Lower Extremity Assessment Lower Extremity Assessment: Generalized weakness   Cervical / Trunk Assessment Cervical / Trunk Assessment: Normal   Communication Communication Communication: No difficulties   Cognition Arousal/Alertness: Awake/alert Behavior During Therapy: WFL for tasks assessed/performed Overall Cognitive Status: Within Functional Limits for tasks assessed                                                Home Living Family/patient expects to be discharged to:: Private residence Living Arrangements: Children Available Help at Discharge: Family Type of Home: House Home Access: Ramped entrance     Home Layout: One level  Bathroom Shower/Tub: Occupational psychologist: Handicapped height     Home Equipment: Environmental consultant - 2 wheels;Bedside commode;Shower seat          Prior Functioning/Environment Level of Independence: Independent                          OT Goals(Current goals can be found in the care plan section) Acute Rehab OT Goals Patient Stated Goal: home. less pain. OT Goal Formulation:  With patient  OT Frequency:      AM-PAC PT "6 Clicks" Daily Activity     Outcome Measure Help from another person eating meals?: None Help from another person taking care of personal grooming?: None Help from another person toileting, which includes using toliet, bedpan, or urinal?: A Little Help from another person bathing (including washing, rinsing, drying)?: A Little Help from another person to put on and taking off regular upper body clothing?: None Help from another person to put on and taking off regular lower body clothing?: A Little 6 Click Score: 21   End of Session Equipment Utilized During Treatment: Rolling walker Nurse Communication: Mobility status  Activity Tolerance: Patient tolerated treatment well Patient left: in chair                   Time: 1001-1030 OT Time Calculation (min): 29 min Charges:  OT General Charges $OT Visit: 1 Visit OT Evaluation $OT Eval Low Complexity: 1 Low OT Treatments $Self Care/Home Management : 8-22 mins G-Codes:     Kari Baars, OT [phone]  Payton Mccallum D 03/08/2018, 11:57 AM

## 2018-03-20 ENCOUNTER — Ambulatory Visit: Payer: Self-pay | Admitting: Neurology

## 2018-04-02 ENCOUNTER — Other Ambulatory Visit: Payer: Self-pay | Admitting: Pediatrics

## 2018-04-02 DIAGNOSIS — IMO0002 Reserved for concepts with insufficient information to code with codable children: Secondary | ICD-10-CM

## 2018-04-02 DIAGNOSIS — G43709 Chronic migraine without aura, not intractable, without status migrainosus: Secondary | ICD-10-CM

## 2018-04-02 DIAGNOSIS — F32A Depression, unspecified: Secondary | ICD-10-CM

## 2018-04-02 DIAGNOSIS — F329 Major depressive disorder, single episode, unspecified: Secondary | ICD-10-CM

## 2018-04-06 ENCOUNTER — Other Ambulatory Visit: Payer: Self-pay | Admitting: Pediatrics

## 2018-04-06 DIAGNOSIS — G4700 Insomnia, unspecified: Secondary | ICD-10-CM

## 2018-04-11 ENCOUNTER — Telehealth: Payer: Self-pay | Admitting: Licensed Clinical Social Worker

## 2018-04-11 NOTE — Telephone Encounter (Signed)
[place] call to client. She states, "I feel ok but I have my days". She reports mild depressive symptoms several days out the week. She attributes her depression to a recent surgery 03/07/2018. Recovering from her surgery is her biggest stressor. She also struggles with loosing her idependence and having to aks family membes for help . However, doesn't mind asking for help because she relies on her family for support. She reports open communication with her faily when she feels down. Talking to her family helps her work through her depression. She reported that she didn't feels as if she needs services at this time but would keep the referrals and use if needed.

## 2018-05-01 ENCOUNTER — Other Ambulatory Visit: Payer: Self-pay | Admitting: Pediatrics

## 2018-05-01 DIAGNOSIS — G4700 Insomnia, unspecified: Secondary | ICD-10-CM

## 2018-05-11 ENCOUNTER — Telehealth: Payer: Self-pay | Admitting: Clinical

## 2018-05-11 NOTE — Telephone Encounter (Signed)
This [facility] Intern left message to call back with name and contact information.  Ritta Slot, M.A. Behavioral Health Intern

## 2018-05-16 ENCOUNTER — Other Ambulatory Visit: Payer: Self-pay | Admitting: Pediatrics

## 2018-05-16 DIAGNOSIS — Z1231 Encounter for screening mammogram for malignant neoplasm of breast: Secondary | ICD-10-CM

## 2018-05-17 NOTE — BH Specialist Note (Signed)
A user error has taken place: encounter opened in error, closed for administrative reasons.

## 2018-05-17 NOTE — Telephone Encounter (Signed)
Error in charting.

## 2018-05-17 NOTE — Telephone Encounter (Signed)
A user error has taken place: encounter opened in error, closed for administrative reasons.

## 2018-05-24 ENCOUNTER — Telehealth: Payer: Self-pay

## 2018-05-24 NOTE — Telephone Encounter (Signed)
[facility] - Left Msg 

## 2018-05-29 ENCOUNTER — Other Ambulatory Visit: Payer: Self-pay | Admitting: Family Medicine

## 2018-05-29 ENCOUNTER — Other Ambulatory Visit: Payer: Self-pay | Admitting: Pediatrics

## 2018-05-29 DIAGNOSIS — F329 Major depressive disorder, single episode, unspecified: Secondary | ICD-10-CM

## 2018-05-29 DIAGNOSIS — F32A Depression, unspecified: Secondary | ICD-10-CM

## 2018-05-29 DIAGNOSIS — G629 Polyneuropathy, unspecified: Secondary | ICD-10-CM

## 2018-05-29 DIAGNOSIS — G43709 Chronic migraine without aura, not intractable, without status migrainosus: Secondary | ICD-10-CM

## 2018-05-29 DIAGNOSIS — G4700 Insomnia, unspecified: Secondary | ICD-10-CM

## 2018-05-29 DIAGNOSIS — IMO0002 Reserved for concepts with insufficient information to code with codable children: Secondary | ICD-10-CM

## 2018-05-30 ENCOUNTER — Encounter (HOSPITAL_COMMUNITY): Payer: Self-pay

## 2018-05-30 ENCOUNTER — Ambulatory Visit (HOSPITAL_COMMUNITY)
Admission: RE | Admit: 2018-05-30 | Discharge: 2018-05-30 | Disposition: A | Discharge status: Home / Self Care | Payer: Medicare Other | Source: Ambulatory Visit | Attending: Pediatrics | Admitting: Pediatrics

## 2018-05-30 ENCOUNTER — Other Ambulatory Visit: Payer: Self-pay | Admitting: Pediatrics

## 2018-05-30 DIAGNOSIS — Z1231 Encounter for screening mammogram for malignant neoplasm of breast: Secondary | ICD-10-CM | POA: Insufficient documentation

## 2018-06-26 ENCOUNTER — Telehealth: Payer: Self-pay

## 2018-06-26 DIAGNOSIS — F419 Anxiety disorder, unspecified: Secondary | ICD-10-CM

## 2018-06-26 NOTE — BH Specialist Note (Signed)
[place] Telephone Follow-up  MRN: [id number] NAME: [name] Date: 06/26/18   Total time: 30 minutes Call number: 5/6  Reason for call today: Reason for Contact: PHQ9-8 weeks     PHQ-9 Scores:   Depression screen [facility] 2/9 06/26/2018 03/06/2018 02/21/2018 02/15/2018 02/06/2018  Decreased Interest 3 1 3 2 3   Down, Depressed, Hopeless 3 2 3 3 3   PHQ - 2 Score 6 3 6 5 6   Altered sleeping 2 1 2  0 2  Tired, decreased energy 1 2 0 1 1  Change in appetite 1 0 0 0 2  Feeling bad or failure about yourself  2 1 0 3 2  Trouble concentrating 1 - 0 1 1  Moving slowly or fidgety/restless 0 3 0 0 0  Suicidal thoughts 0 0 0 0 0  PHQ-9 Score 13 10 8 10 14   Difficult doing work/chores Somewhat difficult Very difficult Somewhat difficult - -  Some recent data might be hidden           GAD-7 Scores:  GAD 7 : Generalized Anxiety Score 06/26/2018 03/06/2018 02/15/2018 02/06/2018  Nervous, Anxious, on Edge 2 1 3 3   Control/stop worrying 3 3 3 3   Worry too much - different things 2 1 3 3   Trouble relaxing 1 1 1 2   Restless 1 1 0 1  Easily annoyed or irritable 1 0 0 2  Afraid - awful might happen 2 3 1 1   Total GAD 7 Score 12 10 11 15   Anxiety Difficulty Somewhat difficult - - -    Stress Current stressors: Current Stressors: (Medication is not working.  Continued depression ) Sleep: Sleep: (Varies) Appetite: Appetite: No problems Coping ability: Coping ability: Exhausted, Overwhelmed Patient taking medications as prescribed: Patient taking medications as prescribed: Yes  Current medications:  Outpatient Encounter Medications as of 06/26/2018  Medication Sig  . acetaminophen (TYLENOL) 500 MG tablet Take 1,000 mg by mouth 2 (two) times daily as needed for moderate pain.  . Botulinum Toxin Type A (BOTOX) 200 units SOLR Inject 200 Units as directed every 3 (three) months. (Patient not taking: Reported on 02/21/2018)  . busPIRone (BUSPAR) 5 MG tablet Take 1 tablet (5 mg  total) by mouth 3 (three) times daily.  . cholecalciferol (VITAMIN D) 1000 UNITS tablet Take 1,000 Units by mouth daily.  [name] gabapentin (NEURONTIN) 600 MG tablet Take 1 tablet by mouth three times daily.  . hydrALAZINE (APRESOLINE) 50 MG tablet Take 1 tablet (50 mg total) by mouth 3 times daily.  . hydrOXYzine (ATARAX/VISTARIL) 10 MG tablet Take 10 mg by mouth 3 (three) times daily as needed for itching.  . Ipratropium-Albuterol (COMBIVENT RESPIMAT) 20-100 MCG/ACT AERS respimat INHALE 2 PUFFS INTO THE LUNGS TWICE A DAY (Patient taking differently: Inhale 2 puffs into the lungs 2 (two) times daily as needed for wheezing or shortness of breath. )  . levETIRAcetam (KEPPRA) 500 MG tablet Take 1 tablet (500 mg total) by mouth 2 (two) times daily.  . methocarbamol (ROBAXIN) 500 MG tablet Take 1 tablet (500 mg total) by mouth every 6 (six) hours as needed for muscle spasms.  . pantoprazole (PROTONIX) 40 MG tablet Take 1 tablet (40 mg total) by mouth daily.  . pravastatin (PRAVACHOL) 40 MG tablet Take 40 mg by mouth at bedtime.  . topiramate (TOPAMAX) 15 MG capsule Take 2 capsules (30 mg total) by mouth 2 (two) times daily.  . traZODone (DESYREL) 50 MG tablet Take 1 tablet (50 mg total)  by mouth at bedtime.  [name] venlafaxine (EFFEXOR) 100 MG tablet Take 1 tablet (100 mg total) by mouth 2 (two) times daily.  [name] zolpidem (AMBIEN) 5 MG tablet Take 1 tablet (5 mg total) by mouth at bedtime as needed for sleep.   No facility-administered encounter medications on file as of 07-24-18.      Self-harm Behaviors Risk Assessment Self-harm risk factors: Self-harm risk factors: (None Reported) Patient endorses recent thoughts of harming self: Have you recently had any thoughts about harming yourself?: No  [place] Suicide Severity Rating Scale: No flowsheet data found. C-SRSS 24-Jul-2018  1. Wish to be Dead No  2. Suicidal Thoughts No  6. Suicide Behavior Question No     Danger to Others Risk Assessment Danger  to others risk factors: Danger to Others Risk Factors: No risk factors noted Patient endorses recent thoughts of harming others: Notification required: No need or identified person     Substance Use Assessment Patient recently consumed alcohol: Have you recently consumed alcohol?: No  Alcohol Use Disorder Identification Test (AUDIT):  Alcohol Use Disorder Test (AUDIT) 07/24/2018  1. How often do you have a drink containing alcohol? 0  2. How many drinks containing alcohol do you have on a typical day when you are drinking? 0  3. How often do you have six or more drinks on one occasion? 0  AUDIT-C Score 0  Intervention/Follow-up AUDIT Score <7 follow-up not indicated   Patient recently used drugs: Have you recently used any drugs?: No     Goals, Interventions and Follow-up Plan Goals: Increase healthy adjustment to current life circumstances Interventions: Motivational Interviewing, Solution-Focused Strategies, Mindfulness or Psychologist, educational, Behavioral Activation, Brief CBT, Supportive Counseling and Medication Monitoring Follow-up Plan: [facility] Phone Follow Up;  Medication Recommendations   Summary:   Follow Up - Patient reports increased depression.  Patient reports that the medication is not helping her depressive episodes.  Patient denies psychosis.  Patient denies any recent stressors.   Patient reports that she has support from her son.  .Patient denies SI/HI/Psychosis/Substance Abuse. If your symptoms worsen or you have thoughts of suicide/homicide, PLEASE SEEK IMMEDIATE MEDICAL ATTENTION.  You may always call:  National Suicide Hotline: (720) 695-5380;  [place] Crisis Line: 706-384-0105;  Crisis Recovery in [place]: 952-228-8386.  These are available 24 hours a day, 7 days a week.  Patient reports that she is not able to do anything fun herself due to her medical conditions that causes her so much pain.    [name], LCAS-A

## 2018-06-27 NOTE — Progress Notes (Signed)
Patient denies significant improvement in mood symptoms after uptitration of Effexor.   Recommendation:  If the patient is interested, may consider cross tapering from Effexor to sertraline given this medication does not need adjustment in patient with renal failure.   [place] specialist to coach behavioral activation, mindfulness

## 2018-06-29 ENCOUNTER — Other Ambulatory Visit: Payer: Self-pay | Admitting: Family Medicine

## 2018-06-29 ENCOUNTER — Other Ambulatory Visit: Payer: Self-pay | Admitting: Pediatrics

## 2018-06-29 DIAGNOSIS — G4700 Insomnia, unspecified: Secondary | ICD-10-CM

## 2018-07-10 ENCOUNTER — Other Ambulatory Visit: Payer: Self-pay | Admitting: Family Medicine

## 2018-07-10 DIAGNOSIS — G4700 Insomnia, unspecified: Secondary | ICD-10-CM

## 2018-07-11 NOTE — Telephone Encounter (Signed)
Seen 02/21/18.

## 2018-07-12 NOTE — Telephone Encounter (Signed)
Needs to call pharmacy for refill, 6 months of this medicine sent in 03/2018 (#30 tabs with 5 refills).

## 2018-07-24 ENCOUNTER — Other Ambulatory Visit: Payer: Self-pay | Admitting: Family Medicine

## 2018-07-24 DIAGNOSIS — G43709 Chronic migraine without aura, not intractable, without status migrainosus: Secondary | ICD-10-CM

## 2018-07-24 DIAGNOSIS — IMO0002 Reserved for concepts with insufficient information to code with codable children: Secondary | ICD-10-CM

## 2018-07-26 ENCOUNTER — Other Ambulatory Visit: Payer: Self-pay | Admitting: Pediatrics

## 2018-07-27 NOTE — Telephone Encounter (Signed)
Patient last seen March 2019.  Appointment scheduled for 08/01/18 for refill of Ambien.

## 2018-08-01 ENCOUNTER — Encounter: Payer: Self-pay | Admitting: Pediatrics

## 2018-08-01 ENCOUNTER — Ambulatory Visit (INDEPENDENT_AMBULATORY_CARE_PROVIDER_SITE_OTHER): Payer: Medicare Other | Admitting: Pediatrics

## 2018-08-01 VITALS — BP 160/97 | HR 77 | Temp 97.8°F | Ht 67.0 in | Wt 203.6 lb

## 2018-08-01 DIAGNOSIS — G629 Polyneuropathy, unspecified: Secondary | ICD-10-CM

## 2018-08-01 DIAGNOSIS — R569 Unspecified convulsions: Secondary | ICD-10-CM

## 2018-08-01 DIAGNOSIS — N184 Chronic kidney disease, stage 4 (severe): Secondary | ICD-10-CM

## 2018-08-01 DIAGNOSIS — D472 Monoclonal gammopathy: Secondary | ICD-10-CM

## 2018-08-01 DIAGNOSIS — G4700 Insomnia, unspecified: Secondary | ICD-10-CM | POA: Diagnosis not present

## 2018-08-01 DIAGNOSIS — I10 Essential (primary) hypertension: Secondary | ICD-10-CM

## 2018-08-01 DIAGNOSIS — F32A Depression, unspecified: Secondary | ICD-10-CM

## 2018-08-01 DIAGNOSIS — F329 Major depressive disorder, single episode, unspecified: Secondary | ICD-10-CM | POA: Diagnosis not present

## 2018-08-01 DIAGNOSIS — F419 Anxiety disorder, unspecified: Secondary | ICD-10-CM

## 2018-08-01 MED ORDER — HYDRALAZINE HCL 50 MG PO TABS
ORAL_TABLET | ORAL | 2 refills | Status: AC
Start: 2018-08-01 — End: ?

## 2018-08-01 MED ORDER — LEVETIRACETAM 500 MG PO TABS: 500.0000 mg | ORAL_TABLET | Freq: Two times a day (BID) | ORAL | 2 refills | Status: DC

## 2018-08-01 MED ORDER — VENLAFAXINE HCL 100 MG PO TABS: 100.0000 mg | ORAL_TABLET | Freq: Two times a day (BID) | ORAL | 2 refills | Status: DC

## 2018-08-01 MED ORDER — GABAPENTIN 600 MG PO TABS: 600.0000 mg | ORAL_TABLET | Freq: Two times a day (BID) | ORAL | 5 refills | Status: DC

## 2018-08-01 MED ORDER — TRAZODONE HCL 50 MG PO TABS: 50.0000 mg | ORAL_TABLET | Freq: Every day | ORAL | 2 refills | Status: DC

## 2018-08-01 MED ORDER — BUSPIRONE HCL 5 MG PO TABS: 5.0000 mg | ORAL_TABLET | Freq: Three times a day (TID) | ORAL | 2 refills | Status: DC

## 2018-08-01 MED ORDER — ZOLPIDEM TARTRATE 5 MG PO TABS: 5.0000 mg | ORAL_TABLET | Freq: Every evening | ORAL | 2 refills | Status: DC | PRN

## 2018-08-01 NOTE — Progress Notes (Signed)
Subjective:   Patient ID: [name], female    DOB: 1951/06/17, 67 y.o.   MRN: [id number] CC: Depression (Med refill)  HPI: [name] is a 67 y.o. female   Depression: Mood is been doing okay.  She has good days and bad days she says.  The bad days are not as bad as what they used to be.  Insomnia: Symptoms ongoing.  Taking Ambien a few nights a week.  Not taking it every day.  Would like a refill.  Hypertension: Needs refill on her medicines.  Seizure history: No recent seizures.  Was following with neurology.  Needs refill of the Keppra.  Headaches: Have been better controlled over the last couple months.  CKD: Follows with nephrology at [place] for follow-up appointment.  MGUS: Was following at the cancer center.  Last appointment last fall.    She has had more itching throughout the day and night over the last couple weeks.  No rash.  No new medications.  Back pain and leg pain: Went through spinal surgery in April 2019.  She thinks it helped a lot with the pain on one side but now she is having more pain on the other side.  Recently seen by orthopedics, Dr. Gladstone Lighter, started her on a steroid pack yesterday.  Has not started helping yet but she is hopeful that will shortly.  Relevant past medical, surgical, family and social history reviewed. Allergies and medications reviewed and updated. Social History   Tobacco Use  Smoking Status Former Smoker  . Packs/day: 1.00  . Years: 30.00  . Pack years: 30.00  . Types: Cigarettes  . Last attempt to quit: 11/13/2012  . Years since quitting: 5.7  Smokeless Tobacco Never Used   ROS: Per HPI   Objective:    BP (!) 160/97   Pulse 77   Temp 97.8 F (36.6 C) (Oral)   Ht _0  (1.702 m)   Wt 203 lb 9.6 oz (92.4 kg)   BMI 31.89 kg/m   Wt Readings from Last 3 Encounters:  08/01/18 203 lb 9.6 oz (92.4 kg)  03/07/18 204 lb (92.5 kg)  02/28/18 205 lb 9.6 oz (93.3 kg)    Gen: NAD, alert,  cooperative with exam, NCAT EYES: EOMI, no conjunctival injection, or no icterus ENT:  OP without erythema LYMPH: no cervical LAD CV: NRRR, normal S1/S2, no murmur, distal pulses 2+ b/l Resp: CTABL, no wheezes, normal WOB Abd: +BS, soft, NTND. no guarding or organomegaly Ext: No edema, warm Neuro: Alert and oriented, strength equal b/l UE and LE, coordination grossly normal MSK: normal muscle bulk Psych: Normal affect, mood is okay today.  No thoughts of self-harm or not wanting to be here anymore.  Assessment & Plan:  [name] was seen today for depression.  Diagnoses and all orders for this visit:  CKD (chronic kidney disease), stage IV ([place]) Recheck labs.  Overdue to get back into see her nephrologist.  Patient to call to set up an appointment. -     CBC with Differential/Platelet -     CMP14+EGFR  Insomnia, unspecified type Stable.  Not taking below every day.  Does help with insomnia when she needs it.  Controlled substance agreement signed today.  Patient aware she needs to be seen for more refills. -     zolpidem (AMBIEN) 5 MG tablet; Take 1 tablet (5 mg total) by mouth at bedtime as needed for sleep. -     traZODone (DESYREL) 50  MG tablet; Take 1 tablet (50 mg total) by mouth at bedtime.  Depression, unspecified depression type Stable, continue below.  Need to continue to monitor kidney function.  May need to switch to sertraline in the future. -     venlafaxine (EFFEXOR) 100 MG tablet; Take 1 tablet (100 mg total) by mouth 2 (two) times daily.  Seizures ([place]) No recent seizures, due for follow-up with neurology. -     levETIRAcetam (KEPPRA) 500 MG tablet; Take 1 tablet (500 mg total) by mouth 2 (two) times daily.  Essential hypertension -     hydrALAZINE (APRESOLINE) 50 MG tablet; Take 1 tablet (50 mg total) by mouth 3 times daily.  Neuropathy Stable, continue below. -     gabapentin (NEURONTIN) 600 MG tablet; Take 1 tablet (600 mg total) by mouth 2 (two) times  daily.  Anxiety Stable, continue below. -     busPIRone (BUSPAR) 5 MG tablet; Take 1 tablet (5 mg total) by mouth 3 (three) times daily.  MGUS (monoclonal gammopathy of unknown significance) Overdue for follow-up. -     Ambulatory referral to Oncology   Follow up plan: Return in about 3 months (around 10/31/2018). Assunta Found, MD [place]

## 2018-08-01 NOTE — Patient Instructions (Signed)
Call to set up follow up with nephrology for your kidneys and neurology for your headaches and seizures.   I put in referral to cancer center for MGUS follow up.

## 2018-08-02 LAB — CBC WITH DIFFERENTIAL/PLATELET
Basophils Absolute: 0.1 10*3/uL (ref 0.0–0.2)
Basos: 1 %
EOS (ABSOLUTE): 0.2 10*3/uL (ref 0.0–0.4)
Eos: 3 %
Hematocrit: 41 % (ref 34.0–46.6)
Hemoglobin: 13.4 g/dL (ref 11.1–15.9)
Immature Grans (Abs): 0 10*3/uL (ref 0.0–0.1)
Immature Granulocytes: 0 %
Lymphocytes Absolute: 1.3 10*3/uL (ref 0.7–3.1)
Lymphs: 22 %
MCH: 30.5 pg (ref 26.6–33.0)
MCHC: 32.7 g/dL (ref 31.5–35.7)
MCV: 93 fL (ref 79–97)
Monocytes Absolute: 0.3 10*3/uL (ref 0.1–0.9)
Monocytes: 6 %
Neutrophils Absolute: 4 10*3/uL (ref 1.4–7.0)
Neutrophils: 68 %
Platelets: 399 10*3/uL (ref 150–450)
RBC: 4.39 x10E6/uL (ref 3.77–5.28)
RDW: 14.3 % (ref 12.3–15.4)
WBC: 5.9 10*3/uL (ref 3.4–10.8)

## 2018-08-02 LAB — CMP14+EGFR
ALT: 11 IU/L (ref 0–32)
AST: 15 IU/L (ref 0–40)
Albumin/Globulin Ratio: 1.3 (ref 1.2–2.2)
Albumin: 4.1 g/dL (ref 3.6–4.8)
Alkaline Phosphatase: 186 IU/L — ABNORMAL HIGH (ref 39–117)
BUN/Creatinine Ratio: 11 — ABNORMAL LOW (ref 12–28)
BUN: 14 mg/dL (ref 8–27)
Bilirubin Total: <0.2 mg/dL (ref 0.0–1.2)
CO2: 18 mmol/L — ABNORMAL LOW (ref 20–29)
Calcium: 9 mg/dL (ref 8.7–10.3)
Chloride: 107 mmol/L — ABNORMAL HIGH (ref 96–106)
Creatinine, Ser: 1.32 mg/dL — ABNORMAL HIGH (ref 0.57–1.00)
GFR calc Af Amer: 48 mL/min/{1.73_m2} — ABNORMAL LOW (ref >59)
GFR calc non Af Amer: 42 mL/min/{1.73_m2} — ABNORMAL LOW (ref >59)
Globulin, Total: 3.1 g/dL (ref 1.5–4.5)
Glucose: 84 mg/dL (ref 65–99)
Potassium: 4.9 mmol/L (ref 3.5–5.2)
Sodium: 145 mmol/L — ABNORMAL HIGH (ref 134–144)
Total Protein: 7.2 g/dL (ref 6.0–8.5)

## 2018-08-07 ENCOUNTER — Telehealth: Payer: Self-pay

## 2018-08-07 NOTE — Telephone Encounter (Signed)
[facility] - Left Msg 

## 2018-08-13 LAB — SPECIMEN STATUS REPORT

## 2018-08-14 ENCOUNTER — Telehealth: Payer: Self-pay

## 2018-08-14 DIAGNOSIS — F419 Anxiety disorder, unspecified: Secondary | ICD-10-CM

## 2018-08-15 NOTE — Progress Notes (Signed)
There has been improvement in her mood. No change in medication recommendation.

## 2018-08-15 NOTE — BH Specialist Note (Signed)
[place] Telephone Follow-up  MRN: [id number] NAME: [name] Date: 08/15/18   Total time: 30 minutes Call number: 4/6  Reason for call today: Reason for Contact: PHQ9-8 weeks  PHQ-9 Scores:  Depression screen [facility] 2/9 08/15/2018 08/01/2018 06/26/2018 03/06/2018 02/21/2018  Decreased Interest 2 2 3 1 3   Down, Depressed, Hopeless 2 0 3 2 3   PHQ - 2 Score 4 2 6 3 6   Altered sleeping 1 3 2 1 2   Tired, decreased energy 1 3 1 2  0  Change in appetite 0 2 1 0 0  Feeling bad or failure about yourself  1 0 2 1 0  Trouble concentrating 0 0 1 - 0  Moving slowly or fidgety/restless 0 0 0 3 0  Suicidal thoughts 0 0 0 0 0  PHQ-9 Score 7 10 13 10 8   Difficult doing work/chores Somewhat difficult Somewhat difficult Somewhat difficult Very difficult Somewhat difficult  Some recent data might be hidden   GAD-7 Scores:  GAD 7 : Generalized Anxiety Score 08/15/2018 06/26/2018 03/06/2018 02/15/2018  Nervous, Anxious, on Edge 1 2 1 3   Control/stop worrying 1 3 3 3   Worry too much - different things 1 2 1 3   Trouble relaxing 1 1 1 1   Restless 0 1 1 0  Easily annoyed or irritable 2 1 0 0  Afraid - awful might happen 1 2 3 1   Total GAD 7 Score 7 12 10 11   Anxiety Difficulty Somewhat difficult Somewhat difficult - -    Stress Current stressors:  None Reported Sleep:  Good Appetite:  Varies Coping ability:  Good  Patient taking medications as prescribed:  Yes  Current medications:  Outpatient Encounter Medications as of 08/14/2018  Medication Sig  . acetaminophen (TYLENOL) 500 MG tablet Take 1,000 mg by mouth 2 (two) times daily as needed for moderate pain.  . Botulinum Toxin Type A (BOTOX) 200 units SOLR Inject 200 Units as directed every 3 (three) months.  . busPIRone (BUSPAR) 5 MG tablet Take 1 tablet (5 mg total) by mouth 3 (three) times daily.  . cholecalciferol (VITAMIN D) 1000 UNITS tablet Take 1,000 Units by mouth daily.  [name] gabapentin (NEURONTIN) 600 MG tablet Take 1  tablet (600 mg total) by mouth 2 (two) times daily.  . hydrALAZINE (APRESOLINE) 50 MG tablet Take 1 tablet (50 mg total) by mouth 3 times daily.  . hydrOXYzine (ATARAX/VISTARIL) 10 MG tablet Take 10 mg by mouth 3 (three) times daily as needed for itching.  . Ipratropium-Albuterol (COMBIVENT RESPIMAT) 20-100 MCG/ACT AERS respimat INHALE 2 PUFFS INTO THE LUNGS TWICE A DAY (Patient taking differently: Inhale 2 puffs into the lungs 2 (two) times daily as needed for wheezing or shortness of breath. )  . levETIRAcetam (KEPPRA) 500 MG tablet Take 1 tablet (500 mg total) by mouth 2 (two) times daily.  . methocarbamol (ROBAXIN) 500 MG tablet Take 1 tablet (500 mg total) by mouth every 6 (six) hours as needed for muscle spasms.  . pantoprazole (PROTONIX) 40 MG tablet Take 1 tablet (40 mg total) by mouth daily.  . pravastatin (PRAVACHOL) 40 MG tablet Take 40 mg by mouth at bedtime.  . predniSONE (DELTASONE) 10 MG tablet prednisone 10 mg tablet  take 1 tab three times a day for 2 days, 1 tab twice a day for 5 days, 1 tab daily till finished  . topiramate (TOPAMAX) 15 MG capsule Take 2 capsules (30 mg total) by mouth 2 (two) times daily.  [name]  traZODone (DESYREL) 50 MG tablet Take 1 tablet (50 mg total) by mouth at bedtime.  [name] venlafaxine (EFFEXOR) 100 MG tablet Take 1 tablet (100 mg total) by mouth 2 (two) times daily.  [name] zolpidem (AMBIEN) 5 MG tablet Take 1 tablet (5 mg total) by mouth at bedtime as needed for sleep.   No facility-administered encounter medications on file as of 08/14/2018.      Self-harm Behaviors Risk Assessment Self-harm risk factors:  No Patient endorses recent thoughts of harming self:  NO   [place] Suicide Severity Rating Scale: No flowsheet data found. C-SRSS 06/26/2018 2018-08-16  1. Wish to be Dead No No  2. Suicidal Thoughts No No  6. Suicide Behavior Question No No     Danger to Others Risk Assessment Danger to others risk factors:  No Patient endorses recent thoughts of  harming others:  No    Substance Use Assessment Patient recently consumed alcohol:  No  Alcohol Use Disorder Identification Test (AUDIT):  Alcohol Use Disorder Test (AUDIT) 06/26/2018 16-Aug-2018  1. How often do you have a drink containing alcohol? 0 0  2. How many drinks containing alcohol do you have on a typical day when you are drinking? 0 0  3. How often do you have six or more drinks on one occasion? 0 0  AUDIT-C Score 0 0  Intervention/Follow-up AUDIT Score <7 follow-up not indicated AUDIT Score <7 follow-up not indicated   Patient recently used drugs:  No    Goals, Interventions and Follow-up Plan Goals: Increase healthy adjustment to current life circumstances Interventions: Supportive Counseling Follow-up Plan: [facility] Phone Follow Up   Summary:   Follow Up - Patient has a decrease in her PHQ and GAD score.  Patient reports that her medication has been helping her.  Patient has her two son's to assist her if it is needed.   Patient reports that she has plans on going to church.  Patient reports that spending time with her family and friends is more helpful than mindfulness.    Patient denies SI/HI/Psychosis/Substance Abuse. If your symptoms worsen or you have thoughts of suicide/homicide, PLEASE SEEK IMMEDIATE MEDICAL ATTENTION.  You may always call:  National Suicide Hotline: 575-562-4353;  [place] Crisis Line: 873-334-7151;  Crisis Recovery in [place]: 416-372-2847.  These are available 24 hours a day, 7 days a week.  During the next session writer will discuss self care activities for herself.   [name], LCAS-A

## 2018-08-22 ENCOUNTER — Encounter: Payer: Self-pay | Admitting: Pediatrics

## 2018-08-22 ENCOUNTER — Ambulatory Visit (INDEPENDENT_AMBULATORY_CARE_PROVIDER_SITE_OTHER): Payer: Medicare Other | Admitting: Pediatrics

## 2018-08-22 VITALS — BP 139/81 | HR 80 | Temp 97.5°F | Ht 67.0 in | Wt 200.2 lb

## 2018-08-22 DIAGNOSIS — G4700 Insomnia, unspecified: Secondary | ICD-10-CM | POA: Diagnosis not present

## 2018-08-22 DIAGNOSIS — Z23 Encounter for immunization: Secondary | ICD-10-CM

## 2018-08-22 DIAGNOSIS — J069 Acute upper respiratory infection, unspecified: Secondary | ICD-10-CM | POA: Diagnosis not present

## 2018-08-22 MED ORDER — ZOLPIDEM TARTRATE 5 MG PO TABS: 5.0000 mg | ORAL_TABLET | Freq: Every evening | ORAL | 2 refills | Status: DC | PRN

## 2018-08-22 NOTE — Progress Notes (Signed)
  Subjective:   Patient ID: Marcie Mowers, female    DOB: 1951/08/21, 67 y.o.   MRN: [id number] CC: Headache (not feeling well 3 days since Sunday, wants flu shot too)  HPI: LOVELL ROE is a 67 y.o. female   Feeling rundown the last few days.  Some headache, some nasal congestion.  No fevers.  Appetite has been okay.  Insomnia: Continues to struggle with sleep though get some relief with Ambien and trazodone, taking nightly.  Denies any altered mental status, confusion.  Relevant past medical, surgical, family and social history reviewed. Allergies and medications reviewed and updated. Social History   Tobacco Use  Smoking Status Former Smoker  . Packs/day: 1.00  . Years: 30.00  . Pack years: 30.00  . Types: Cigarettes  . Last attempt to quit: 11/13/2012  . Years since quitting: 5.7  Smokeless Tobacco Never Used   ROS: Per HPI   Objective:    BP 139/81   Pulse 80   Temp (!) 97.5 F (36.4 C) (Oral)   Ht 5\' 7"  (1.702 m)   Wt 200 lb 3.2 oz (90.8 kg)   BMI 31.36 kg/m   Wt Readings from Last 3 Encounters:  08/22/18 200 lb 3.2 oz (90.8 kg)  08/01/18 203 lb 9.6 oz (92.4 kg)  03/07/18 204 lb (92.5 kg)    Gen: NAD, alert, cooperative with exam, NCAT EYES: EOMI, no conjunctival injection, or no icterus ENT:  TMs dull gray b/l, OP with mild erythema LYMPH: no cervical LAD CV: NRRR, normal S1/S2, no murmur, distal pulses 2+ b/l Resp: CTABL, no wheezes, normal WOB Abd: +BS, soft, NTND.  Ext: No edema, warm Neuro: Alert and oriented  Assessment & Plan:  Mckenzie was seen today for headache.  Diagnoses and all orders for this visit:  Acute URI Symptomatic care, return precautions discussed  Insomnia, unspecified type Stable, continue below.  Any adverse reactions patient to let us know. -     zolpidem (AMBIEN) 5 MG tablet; Take 1 tablet (5 mg total) by mouth at bedtime as needed for sleep.  Need for immunization against influenza -     Flu Vaccine QUAD 36+ mos  IM   Follow up plan: Return in about 12 weeks (around 11/14/2018). Assunta Found, MD [place]

## 2018-08-28 ENCOUNTER — Telehealth: Payer: Self-pay

## 2018-08-28 ENCOUNTER — Other Ambulatory Visit (HOSPITAL_COMMUNITY): Payer: Self-pay

## 2018-08-28 NOTE — Telephone Encounter (Signed)
[facility] - Left Msg 

## 2018-09-04 ENCOUNTER — Ambulatory Visit (HOSPITAL_COMMUNITY): Payer: Self-pay | Admitting: Internal Medicine

## 2018-09-11 ENCOUNTER — Telehealth: Payer: Self-pay

## 2018-09-11 NOTE — Telephone Encounter (Signed)
2nd attempt - [facility]   

## 2018-09-17 ENCOUNTER — Telehealth: Payer: Self-pay

## 2018-09-17 NOTE — Telephone Encounter (Signed)
Patient reports that she was taking a nap and she was not able to talk.

## 2018-09-28 ENCOUNTER — Other Ambulatory Visit: Payer: Self-pay | Admitting: Pediatrics

## 2018-09-28 DIAGNOSIS — IMO0002 Reserved for concepts with insufficient information to code with codable children: Secondary | ICD-10-CM

## 2018-09-28 DIAGNOSIS — G43709 Chronic migraine without aura, not intractable, without status migrainosus: Secondary | ICD-10-CM

## 2018-09-28 DIAGNOSIS — E785 Hyperlipidemia, unspecified: Secondary | ICD-10-CM

## 2018-10-01 NOTE — Telephone Encounter (Signed)
Last seen 08/22/18  Last lipid 04/23/16

## 2018-10-03 ENCOUNTER — Telehealth: Payer: Self-pay

## 2018-10-03 NOTE — Telephone Encounter (Signed)
Inactive - Several attempts have been made to contact patient without success. Patient will be placed on the inactive list.  If  You have any questions or if services are needed again,   please contact [facility] at [phone] or place another consult request

## 2018-10-26 ENCOUNTER — Other Ambulatory Visit: Payer: Self-pay | Admitting: Pediatrics

## 2018-10-26 DIAGNOSIS — G4700 Insomnia, unspecified: Secondary | ICD-10-CM

## 2018-10-27 NOTE — Telephone Encounter (Signed)
Last seen 08/22/18

## 2018-10-31 ENCOUNTER — Ambulatory Visit: Payer: Medicare Other | Admitting: Pediatrics

## 2018-11-22 ENCOUNTER — Other Ambulatory Visit: Payer: Self-pay | Admitting: Pediatrics

## 2018-11-22 DIAGNOSIS — G4700 Insomnia, unspecified: Secondary | ICD-10-CM

## 2018-11-23 NOTE — Telephone Encounter (Signed)
Last seen 08/22/18, has appt 11/26/18

## 2018-11-26 ENCOUNTER — Ambulatory Visit: Payer: Medicare Other | Admitting: Pediatrics

## 2018-11-29 ENCOUNTER — Other Ambulatory Visit: Payer: Self-pay | Admitting: Pediatrics

## 2018-11-29 DIAGNOSIS — IMO0002 Reserved for concepts with insufficient information to code with codable children: Secondary | ICD-10-CM

## 2018-11-29 DIAGNOSIS — G43709 Chronic migraine without aura, not intractable, without status migrainosus: Secondary | ICD-10-CM

## 2018-11-30 NOTE — Telephone Encounter (Signed)
OV 12/11/18

## 2018-12-07 ENCOUNTER — Ambulatory Visit (INDEPENDENT_AMBULATORY_CARE_PROVIDER_SITE_OTHER): Payer: Medicare Other | Admitting: Pediatrics

## 2018-12-07 ENCOUNTER — Ambulatory Visit (INDEPENDENT_AMBULATORY_CARE_PROVIDER_SITE_OTHER): Payer: Medicare Other

## 2018-12-07 ENCOUNTER — Encounter: Payer: Self-pay | Admitting: Pediatrics

## 2018-12-07 VITALS — BP 137/86 | HR 85 | Temp 97.8°F | Resp 20 | Ht 67.0 in | Wt 205.6 lb

## 2018-12-07 DIAGNOSIS — R05 Cough: Secondary | ICD-10-CM

## 2018-12-07 DIAGNOSIS — J189 Pneumonia, unspecified organism: Secondary | ICD-10-CM

## 2018-12-07 DIAGNOSIS — R059 Cough, unspecified: Secondary | ICD-10-CM

## 2018-12-07 DIAGNOSIS — J181 Lobar pneumonia, unspecified organism: Secondary | ICD-10-CM | POA: Diagnosis not present

## 2018-12-07 MED ORDER — AZITHROMYCIN 250 MG PO TABS: ORAL_TABLET | ORAL | 0 refills | Status: DC

## 2018-12-07 NOTE — Progress Notes (Signed)
  Subjective:   Patient ID: [name], female    DOB: Mar 11, 1951, 68 y.o.   MRN: [id number] CC: Pneumonia (Recheck)  HPI: [name] is a 68 y.o. female   Seen in emergency room 1 week ago, diagnosed with pneumonia, treated with moxifloxacin.  She was only able to pick up a few days of the medicine from the pharmacy, has not been able to make it back over there to pick up the remainder, they did not have it all in stock when she first went.  When she was seen in the emergency room, she was having cough productive of yellow sputum.  Flu test was negative.  She says she is continued to cough, now more dry.  She continues to have aches and chills throughout the day.  She is not taking any Tylenol she says because it does not help.  She is bothered by congestion and her ears have been feeling full.  Relevant past medical, surgical, family and social history reviewed. Allergies and medications reviewed and updated. Social History   Tobacco Use  Smoking Status Former Smoker  . Packs/day: 1.00  . Years: 30.00  . Pack years: 30.00  . Types: Cigarettes  . Last attempt to quit: 11/13/2012  . Years since quitting: 6.0  Smokeless Tobacco Never Used   ROS: Per HPI   Objective:    BP 137/86   Pulse 85   Temp 97.8 F (36.6 C) (Oral)   Resp 20   Ht 5\' 7"  (1.702 m)   Wt 205 lb 9.6 oz (93.3 kg)   SpO2 96%   BMI 32.20 kg/m   Wt Readings from Last 3 Encounters:  12/07/18 205 lb 9.6 oz (93.3 kg)  08/22/18 200 lb 3.2 oz (90.8 kg)  08/01/18 203 lb 9.6 oz (92.4 kg)    Gen: NAD, alert, cooperative with exam, NCAT EYES: EOMI, no conjunctival injection, or no icterus ENT:  TMs pink bilaterally with layering yellow effusion, OP without erythema LYMPH: no cervical LAD CV: NRRR, normal S1/S2, no murmur, distal pulses 2+ b/l Resp: CTABL, no wheezes, normal WOB Abd: +BS, soft, NTND.  Ext: No edema, warm Neuro: Alert and oriented, strength equal b/l UE and LE, coordination grossly  normal MSK: normal muscle bulk  Assessment & Plan:  [name] was seen today for pneumonia.  Diagnoses and all orders for this visit:  Community acquired pneumonia of right middle lobe of lung ([place]) AOM b/l Repeat chest x-ray without patchy infiltrates previously seen.  Given bilateral ear pain, ongoing cough with diagnosis of pneumonia that was not fully treated because she did not pick up the antibiotics, will go ahead and treat with below -     azithromycin (ZITHROMAX) 250 MG tablet; Take 2 the first day and then one each day after.  Cough -     DG Chest 2 View; Future   Follow up plan: Return in about 3 weeks (around 12/28/2018). Assunta Found, MD [place]

## 2018-12-11 ENCOUNTER — Ambulatory Visit: Payer: Medicare Other | Admitting: Pediatrics

## 2018-12-28 ENCOUNTER — Ambulatory Visit: Payer: Medicare Other | Admitting: Pediatrics

## 2019-01-01 ENCOUNTER — Ambulatory Visit: Payer: Medicare Other | Admitting: Family

## 2019-01-08 ENCOUNTER — Ambulatory Visit: Payer: Medicare Other | Admitting: Family

## 2019-01-10 ENCOUNTER — Other Ambulatory Visit: Payer: Self-pay | Admitting: Pediatrics

## 2019-01-10 DIAGNOSIS — E785 Hyperlipidemia, unspecified: Secondary | ICD-10-CM

## 2019-01-31 ENCOUNTER — Encounter: Payer: Self-pay | Admitting: Family

## 2019-01-31 ENCOUNTER — Ambulatory Visit (INDEPENDENT_AMBULATORY_CARE_PROVIDER_SITE_OTHER): Payer: Medicare Other | Admitting: Family

## 2019-01-31 VITALS — BP 171/93 | HR 83 | Temp 97.2°F | Ht 67.0 in | Wt 208.0 lb

## 2019-01-31 DIAGNOSIS — J449 Chronic obstructive pulmonary disease, unspecified: Secondary | ICD-10-CM | POA: Diagnosis not present

## 2019-01-31 DIAGNOSIS — F332 Major depressive disorder, recurrent severe without psychotic features: Secondary | ICD-10-CM

## 2019-01-31 DIAGNOSIS — I10 Essential (primary) hypertension: Secondary | ICD-10-CM | POA: Diagnosis not present

## 2019-01-31 DIAGNOSIS — H9190 Unspecified hearing loss, unspecified ear: Secondary | ICD-10-CM

## 2019-01-31 DIAGNOSIS — M549 Dorsalgia, unspecified: Secondary | ICD-10-CM

## 2019-01-31 DIAGNOSIS — K219 Gastro-esophageal reflux disease without esophagitis: Secondary | ICD-10-CM | POA: Diagnosis not present

## 2019-01-31 DIAGNOSIS — I5032 Chronic diastolic (congestive) heart failure: Secondary | ICD-10-CM

## 2019-01-31 DIAGNOSIS — R569 Unspecified convulsions: Secondary | ICD-10-CM

## 2019-01-31 DIAGNOSIS — M1711 Unilateral primary osteoarthritis, right knee: Secondary | ICD-10-CM

## 2019-01-31 DIAGNOSIS — G8929 Other chronic pain: Secondary | ICD-10-CM

## 2019-01-31 DIAGNOSIS — E669 Obesity, unspecified: Secondary | ICD-10-CM

## 2019-01-31 DIAGNOSIS — Z1211 Encounter for screening for malignant neoplasm of colon: Secondary | ICD-10-CM

## 2019-01-31 DIAGNOSIS — Z1212 Encounter for screening for malignant neoplasm of rectum: Secondary | ICD-10-CM

## 2019-01-31 DIAGNOSIS — Z1159 Encounter for screening for other viral diseases: Secondary | ICD-10-CM

## 2019-01-31 DIAGNOSIS — N184 Chronic kidney disease, stage 4 (severe): Secondary | ICD-10-CM

## 2019-01-31 DIAGNOSIS — Z23 Encounter for immunization: Secondary | ICD-10-CM | POA: Diagnosis not present

## 2019-01-31 DIAGNOSIS — N189 Chronic kidney disease, unspecified: Secondary | ICD-10-CM

## 2019-01-31 DIAGNOSIS — E785 Hyperlipidemia, unspecified: Secondary | ICD-10-CM

## 2019-01-31 DIAGNOSIS — R0683 Snoring: Secondary | ICD-10-CM

## 2019-01-31 MED ORDER — AMLODIPINE BESYLATE 5 MG PO TABS: 5.0000 mg | ORAL_TABLET | Freq: Every day | ORAL | 3 refills | Status: DC

## 2019-01-31 NOTE — Progress Notes (Signed)
Subjective:    Patient ID: [name], female    DOB: September 19, 1951, 68 y.o.   MRN: [id number]  Chief Complaint  Patient presents with  . Medical Management of Chronic Issues    six month recheck   PT presents to the office today for chronic follow up. Pt is followed by Ortho as needed for chronic back pain, and left arthritis. She is followed by nephrologists every 6 months for chronic kidney disease. She is followed by Oncologists every 6 months for MGUS.   Pt states family is concerned that she has sleep apnea. States she "gasps for air and stops breathing" multiple times during the night.  Hypertension  This is a chronic problem. The current episode started more than 1 year ago. The problem has been waxing and waning since onset. The problem is uncontrolled. Associated symptoms include malaise/fatigue. Pertinent negatives include no peripheral edema or shortness of breath. Risk factors for coronary artery disease include dyslipidemia, obesity and sedentary lifestyle. The current treatment provides mild improvement.  Congestive Heart Failure  Presents for follow-up visit. Associated symptoms include fatigue. Pertinent negatives include no edema or shortness of breath. The symptoms have been stable.  Arthritis  Presents for follow-up visit. She complains of stiffness and joint swelling. The symptoms have been stable. Affected locations include the right knee and left knee (back). Her pain is at a severity of 10/10. Associated symptoms include fatigue.  Depression         This is a chronic problem.  The current episode started more than 1 year ago.   The problem occurs intermittently.  The problem has been waxing and waning since onset.  Associated symptoms include fatigue, irritable, decreased interest and sad.  Associated symptoms include no helplessness and no hopelessness.  Past treatments include SNRIs - Serotonin and norepinephrine reuptake inhibitors. Gastroesophageal Reflux  She  complains of heartburn. She reports no belching or no coughing. This is a chronic problem. The current episode started more than 1 year ago. The problem occurs occasionally. The problem has been waxing and waning. Associated symptoms include fatigue.  Seizures   This is a chronic problem. The current episode started more than 1 week ago (last seizure was over two years ago). Pertinent negatives include no cough.  Hyperlipidemia  This is a chronic problem. The current episode started more than 1 year ago. Exacerbating diseases include obesity. Pertinent negatives include no shortness of breath. Current antihyperlipidemic treatment includes statins. The current treatment provides moderate improvement of lipids. Risk factors for coronary artery disease include dyslipidemia, hypertension and a sedentary lifestyle.  COPD Quit smoking 2016. Uses combivent as needed. Stable.   Review of Systems  Constitutional: Positive for fatigue and malaise/fatigue.  Respiratory: Negative for cough and shortness of breath.   Gastrointestinal: Positive for heartburn.  Musculoskeletal: Positive for arthritis, joint swelling and stiffness.  Neurological: Positive for seizures.  Psychiatric/Behavioral: Positive for depression.  All other systems reviewed and are negative.      Objective:   Physical Exam Vitals signs reviewed.  Constitutional:      General: She is irritable. She is not in acute distress.    Appearance: She is well-developed.  HENT:     Head: Normocephalic and atraumatic.     Right Ear: Tympanic membrane normal.     Left Ear: Tympanic membrane normal.  Eyes:     Pupils: Pupils are equal, round, and reactive to light.  Neck:     Musculoskeletal: Normal range of motion and  neck supple.     Thyroid: No thyromegaly.  Cardiovascular:     Rate and Rhythm: Normal rate and regular rhythm.     Heart sounds: Normal heart sounds. No murmur.  Pulmonary:     Effort: Pulmonary effort is normal. No  respiratory distress.     Breath sounds: Normal breath sounds. No wheezing.  Abdominal:     General: Bowel sounds are normal. There is no distension.     Palpations: Abdomen is soft.     Tenderness: There is no abdominal tenderness.  Musculoskeletal:        General: Tenderness (left knee with flexion or extension) present.     Comments: Generalized weakness, using cane   Skin:    General: Skin is warm and dry.  Neurological:     Mental Status: She is alert and oriented to person, place, and time.     Cranial Nerves: No cranial nerve deficit.     Deep Tendon Reflexes: Reflexes are normal and symmetric.  Psychiatric:        Behavior: Behavior normal.        Thought Content: Thought content normal.        Judgment: Judgment normal.       BP (!) 171/93   Pulse 83   Temp (!) 97.2 F (36.2 C) (Oral)   Ht '5\' 7"'  (1.702 m)   Wt 208 lb (94.3 kg)   BMI 32.58 kg/m      Assessment & Plan:  [name] comes in today with chief complaint of Medical Management of Chronic Issues (six month recheck)   Diagnosis and orders addressed:  1. Chronic diastolic congestive heart failure (HCC) - CMP14+EGFR  2. Essential hypertension Added Norvasc 5 mg today -Dash diet information given -Exercise encouraged - Stress Management  -Continue current meds -RTO in 2 weeks  - CMP14+EGFR - amLODipine (NORVASC) 5 MG tablet; Take 1 tablet (5 mg total) by mouth daily.  Dispense: 90 tablet; Refill: 3  3. Chronic obstructive pulmonary disease, unspecified COPD type (HCC) - CMP14+EGFR  4. Gastroesophageal reflux disease without esophagitis - CMP14+EGFR  5. Primary osteoarthritis of right knee - CMP14+EGFR  6. Chronic back pain, unspecified back location, unspecified back pain laterality - CMP14+EGFR  7. Chronic kidney disease, unspecified CKD stage - CMP14+EGFR  8. Obesity (BMI 30-39.9) - CMP14+EGFR  9. Seizures (HCC) - CMP14+EGFR  10. Severe episode of recurrent major  depressive disorder, without psychotic features ([place]) - CMP14+EGFR  11. CKD (chronic kidney disease), stage IV (HCC) - CMP14+EGFR  12. Colon cancer screening - CMP14+EGFR - Cologuard  13. Screening for malignant neoplasm of the rectum - CMP14+EGFR - Cologuard  14. Need for hepatitis C screening test - CMP14+EGFR - Hepatitis C antibody  15. Snores - Ambulatory referral to Pulmonology  16. Hearing loss, unspecified hearing loss type, unspecified laterality - Ambulatory referral to Audiology  17. Hyperlipidemia, unspecified hyperlipidemia type - Lipid panel   Labs pending Health Maintenance reviewed Diet and exercise encouraged  Follow up plan: 2 weeks to recheck HTN  Evelina Dun, FNP

## 2019-01-31 NOTE — Addendum Note (Signed)
Addended by: Shelbie Ammons on: 01/31/2019 09:29 AM   Modules accepted: Orders

## 2019-01-31 NOTE — Patient Instructions (Signed)

## 2019-02-01 LAB — CMP14+EGFR
ALT: 23 IU/L (ref 0–32)
AST: 17 IU/L (ref 0–40)
Albumin/Globulin Ratio: 1.3 (ref 1.2–2.2)
Albumin: 4.1 g/dL (ref 3.8–4.8)
Alkaline Phosphatase: 221 IU/L — ABNORMAL HIGH (ref 39–117)
BUN/Creatinine Ratio: 16 (ref 12–28)
BUN: 21 mg/dL (ref 8–27)
Bilirubin Total: <0.2 mg/dL (ref 0.0–1.2)
CO2: 20 mmol/L (ref 20–29)
Calcium: 8.7 mg/dL (ref 8.7–10.3)
Chloride: 102 mmol/L (ref 96–106)
Creatinine, Ser: 1.34 mg/dL — ABNORMAL HIGH (ref 0.57–1.00)
GFR calc Af Amer: 47 mL/min/{1.73_m2} — ABNORMAL LOW (ref >59)
GFR calc non Af Amer: 41 mL/min/{1.73_m2} — ABNORMAL LOW (ref >59)
Globulin, Total: 3.2 g/dL (ref 1.5–4.5)
Glucose: 92 mg/dL (ref 65–99)
Potassium: 4.3 mmol/L (ref 3.5–5.2)
Sodium: 140 mmol/L (ref 134–144)
Total Protein: 7.3 g/dL (ref 6.0–8.5)

## 2019-02-01 LAB — LIPID PANEL
Chol/HDL Ratio: 2.3 ratio (ref 0.0–4.4)
Cholesterol, Total: 150 mg/dL (ref 100–199)
HDL: 66 mg/dL (ref >39)
LDL Calculated: 60 mg/dL (ref 0–99)
Triglycerides: 120 mg/dL (ref 0–149)
VLDL Cholesterol Cal: 24 mg/dL (ref 5–40)

## 2019-02-01 LAB — HEPATITIS C ANTIBODY: Hep C Virus Ab: <0.1 s/co ratio (ref 0.0–0.9)

## 2019-02-05 ENCOUNTER — Other Ambulatory Visit: Payer: Self-pay | Admitting: Family

## 2019-02-07 LAB — SPECIMEN STATUS REPORT

## 2019-02-07 LAB — GAMMA GT: GGT: 53 IU/L (ref 0–60)

## 2019-02-11 ENCOUNTER — Other Ambulatory Visit: Payer: Self-pay | Admitting: Family

## 2019-02-11 ENCOUNTER — Other Ambulatory Visit: Payer: Self-pay | Admitting: Pediatrics

## 2019-02-11 ENCOUNTER — Other Ambulatory Visit: Payer: Self-pay | Admitting: Nurse Practitioner

## 2019-02-11 DIAGNOSIS — F419 Anxiety disorder, unspecified: Secondary | ICD-10-CM

## 2019-02-11 DIAGNOSIS — F32A Depression, unspecified: Secondary | ICD-10-CM

## 2019-02-11 DIAGNOSIS — F329 Major depressive disorder, single episode, unspecified: Secondary | ICD-10-CM

## 2019-02-12 NOTE — Telephone Encounter (Signed)
Please advise 

## 2019-02-14 ENCOUNTER — Encounter: Payer: Self-pay | Admitting: Family

## 2019-02-14 ENCOUNTER — Ambulatory Visit (INDEPENDENT_AMBULATORY_CARE_PROVIDER_SITE_OTHER): Payer: Medicare Other | Admitting: Family

## 2019-02-14 ENCOUNTER — Other Ambulatory Visit: Payer: Self-pay

## 2019-02-14 VITALS — BP 148/74 | HR 73 | Temp 98.0°F | Ht 67.0 in | Wt 211.2 lb

## 2019-02-14 DIAGNOSIS — I10 Essential (primary) hypertension: Secondary | ICD-10-CM

## 2019-02-14 DIAGNOSIS — G4700 Insomnia, unspecified: Secondary | ICD-10-CM

## 2019-02-14 DIAGNOSIS — Z79899 Other long term (current) drug therapy: Secondary | ICD-10-CM | POA: Diagnosis not present

## 2019-02-14 MED ORDER — ZOLPIDEM TARTRATE 5 MG PO TABS: 5.0000 mg | ORAL_TABLET | Freq: Every evening | ORAL | 5 refills | Status: DC | PRN

## 2019-02-14 NOTE — Patient Instructions (Signed)

## 2019-02-14 NOTE — Progress Notes (Addendum)
Subjective:    Patient ID: [name], female    DOB: 1951/01/29, 68 y.o.   MRN: [id number]  Chief Complaint  Patient presents with  . hypertension recheck   Pt presents to the office today to recheck HTN. Pt's BP is elevated today. We saw each other on 01/31/19 and her BP was elevated. We added Norvasc 5 mg.  Hypertension  This is a chronic problem. The current episode started more than 1 year ago. The problem has been waxing and waning since onset. The problem is uncontrolled. Pertinent negatives include no headaches, malaise/fatigue, peripheral edema or shortness of breath. Risk factors for coronary artery disease include dyslipidemia, obesity and sedentary lifestyle. The current treatment provides mild improvement. Hypertensive end-organ damage includes heart failure. There is no history of kidney disease.  Insomnia  Primary symptoms: difficulty falling asleep, frequent awakening, no malaise/fatigue.  The current episode started more than one year. The onset quality is gradual. The problem occurs intermittently.      Review of Systems  Constitutional: Negative for malaise/fatigue.  Respiratory: Negative for shortness of breath.   Musculoskeletal: Positive for arthralgias.  Neurological: Negative for headaches.  Psychiatric/Behavioral: The patient has insomnia.   All other systems reviewed and are negative.      Objective:   Physical Exam Vitals signs reviewed.  Constitutional:      General: She is not in acute distress.    Appearance: She is well-developed.  HENT:     Head: Normocephalic and atraumatic.     Right Ear: Tympanic membrane normal.     Left Ear: Tympanic membrane normal.  Eyes:     Pupils: Pupils are equal, round, and reactive to light.  Neck:     Musculoskeletal: Normal range of motion and neck supple.     Thyroid: No thyromegaly.  Cardiovascular:     Rate and Rhythm: Normal rate and regular rhythm.     Heart sounds: Normal heart sounds. No  murmur.  Pulmonary:     Effort: Pulmonary effort is normal. No respiratory distress.     Breath sounds: Decreased breath sounds present. No wheezing.  Abdominal:     General: Bowel sounds are normal. There is no distension.     Palpations: Abdomen is soft.     Tenderness: There is no abdominal tenderness.  Musculoskeletal:        General: No tenderness.  Skin:    General: Skin is warm and dry.  Neurological:     Mental Status: She is alert and oriented to person, place, and time.     Cranial Nerves: No cranial nerve deficit.     Deep Tendon Reflexes: Reflexes are normal and symmetric.  Psychiatric:        Behavior: Behavior normal.        Thought Content: Thought content normal.        Judgment: Judgment normal.     BP (!) 162/75   Pulse 79   Temp 98 F (36.7 C) (Oral)   Ht 5\' 7"  (1.702 m)   Wt 211 lb 3.2 oz (95.8 kg)   BMI 33.08 kg/m      Assessment & Plan:  [name] comes in today with chief complaint of hypertension recheck   Diagnosis and orders addressed:  1. Essential hypertension Improved on recheck after patient was sitting and calmed down -Dash diet information given -Exercise encouraged - Stress Management  -Continue current meds  2. Insomnia, unspecified type Sleep ritual discussed  Avoid caffeine  Pt reviwed in [facility] Controlled database- No red flags - zolpidem (AMBIEN) 5 MG tablet; Take 1 tablet (5 mg total) by mouth at bedtime as needed for sleep.  Dispense: 30 tablet; Refill: 5  3. Controlled substance agreement signed    Evelina Dun, FNP

## 2019-03-12 ENCOUNTER — Other Ambulatory Visit: Payer: Self-pay | Admitting: Pediatrics

## 2019-03-12 DIAGNOSIS — G4700 Insomnia, unspecified: Secondary | ICD-10-CM

## 2019-03-29 ENCOUNTER — Other Ambulatory Visit: Payer: Self-pay | Admitting: Family

## 2019-03-29 DIAGNOSIS — F419 Anxiety disorder, unspecified: Secondary | ICD-10-CM

## 2019-04-09 ENCOUNTER — Other Ambulatory Visit: Payer: Self-pay | Admitting: Family

## 2019-04-09 DIAGNOSIS — F329 Major depressive disorder, single episode, unspecified: Secondary | ICD-10-CM

## 2019-04-09 DIAGNOSIS — F32A Depression, unspecified: Secondary | ICD-10-CM

## 2019-04-09 DIAGNOSIS — G4700 Insomnia, unspecified: Secondary | ICD-10-CM

## 2019-04-09 DIAGNOSIS — E785 Hyperlipidemia, unspecified: Secondary | ICD-10-CM

## 2019-05-09 ENCOUNTER — Other Ambulatory Visit: Payer: Self-pay | Admitting: Family

## 2019-05-09 DIAGNOSIS — F329 Major depressive disorder, single episode, unspecified: Secondary | ICD-10-CM

## 2019-05-09 DIAGNOSIS — F419 Anxiety disorder, unspecified: Secondary | ICD-10-CM

## 2019-05-09 DIAGNOSIS — G4700 Insomnia, unspecified: Secondary | ICD-10-CM

## 2019-05-09 DIAGNOSIS — F32A Depression, unspecified: Secondary | ICD-10-CM

## 2019-06-07 ENCOUNTER — Other Ambulatory Visit: Payer: Self-pay | Admitting: Family

## 2019-06-07 DIAGNOSIS — F419 Anxiety disorder, unspecified: Secondary | ICD-10-CM

## 2019-06-10 NOTE — Telephone Encounter (Signed)
OV 02/14/19 rtc 6 mos

## 2019-06-25 ENCOUNTER — Institutional Professional Consult (permissible substitution): Payer: Self-pay | Admitting: Pulmonary Disease

## 2019-06-27 ENCOUNTER — Institutional Professional Consult (permissible substitution): Payer: Medicare Other | Admitting: Pulmonary Disease

## 2019-07-03 ENCOUNTER — Other Ambulatory Visit: Payer: Self-pay | Admitting: Family

## 2019-07-03 DIAGNOSIS — G4700 Insomnia, unspecified: Secondary | ICD-10-CM

## 2019-07-03 DIAGNOSIS — F32A Depression, unspecified: Secondary | ICD-10-CM

## 2019-07-03 DIAGNOSIS — F329 Major depressive disorder, single episode, unspecified: Secondary | ICD-10-CM

## 2019-07-04 NOTE — Telephone Encounter (Signed)
Patient aware - appt made 

## 2019-07-05 ENCOUNTER — Encounter: Payer: Self-pay | Admitting: Family

## 2019-07-05 ENCOUNTER — Ambulatory Visit (INDEPENDENT_AMBULATORY_CARE_PROVIDER_SITE_OTHER): Payer: Medicare Other | Admitting: Family

## 2019-07-05 DIAGNOSIS — E785 Hyperlipidemia, unspecified: Secondary | ICD-10-CM

## 2019-07-05 DIAGNOSIS — E669 Obesity, unspecified: Secondary | ICD-10-CM

## 2019-07-05 DIAGNOSIS — I10 Essential (primary) hypertension: Secondary | ICD-10-CM

## 2019-07-05 DIAGNOSIS — G4700 Insomnia, unspecified: Secondary | ICD-10-CM

## 2019-07-05 DIAGNOSIS — M1711 Unilateral primary osteoarthritis, right knee: Secondary | ICD-10-CM

## 2019-07-05 DIAGNOSIS — M48062 Spinal stenosis, lumbar region with neurogenic claudication: Secondary | ICD-10-CM

## 2019-07-05 DIAGNOSIS — M549 Dorsalgia, unspecified: Secondary | ICD-10-CM

## 2019-07-05 DIAGNOSIS — K219 Gastro-esophageal reflux disease without esophagitis: Secondary | ICD-10-CM | POA: Diagnosis not present

## 2019-07-05 DIAGNOSIS — F332 Major depressive disorder, recurrent severe without psychotic features: Secondary | ICD-10-CM

## 2019-07-05 DIAGNOSIS — N184 Chronic kidney disease, stage 4 (severe): Secondary | ICD-10-CM

## 2019-07-05 DIAGNOSIS — J449 Chronic obstructive pulmonary disease, unspecified: Secondary | ICD-10-CM | POA: Diagnosis not present

## 2019-07-05 DIAGNOSIS — R569 Unspecified convulsions: Secondary | ICD-10-CM

## 2019-07-05 DIAGNOSIS — D472 Monoclonal gammopathy: Secondary | ICD-10-CM

## 2019-07-05 DIAGNOSIS — I5032 Chronic diastolic (congestive) heart failure: Secondary | ICD-10-CM

## 2019-07-05 DIAGNOSIS — G8929 Other chronic pain: Secondary | ICD-10-CM

## 2019-07-05 DIAGNOSIS — Z79899 Other long term (current) drug therapy: Secondary | ICD-10-CM

## 2019-07-05 MED ORDER — ZOLPIDEM TARTRATE 5 MG PO TABS: 5.0000 mg | ORAL_TABLET | Freq: Every evening | ORAL | 5 refills | Status: DC | PRN

## 2019-07-05 MED ORDER — TRAZODONE HCL 50 MG PO TABS: 50.0000 mg | ORAL_TABLET | Freq: Every day | ORAL | 1 refills | Status: DC

## 2019-07-05 NOTE — Progress Notes (Signed)
Virtual Visit via telephone Note Due to COVID-19 pandemic this visit was conducted virtually. This visit type was conducted due to national recommendations for restrictions regarding the COVID-19 Pandemic (e.g. social distancing, sheltering in place) in an effort to limit this patient's exposure and mitigate transmission in our community. All issues noted in this document were discussed and addressed.  A physical exam was not performed with this format.  I connected with [name] on 07/05/19 at 9:20  by telephone and verified that I am speaking with the correct person using two identifiers. [name] is currently located at home and no one is currently with her during visit. The provider, Evelina Dun, FNP is located in their office at time of visit.  I discussed the limitations, risks, security and privacy concerns of performing an evaluation and management service by telephone and the availability of in person appointments. I also discussed with the patient that there may be a patient responsible charge related to this service. The patient expressed understanding and agreed to proceed.   History and Present Illness: PT calls the office today for chronic follow up. Pt is followed by Ortho as needed for chronic back pain, and left arthritis. She is followed by nephrologists every 6 months for chronic kidney disease. She is followed by Oncologists every 6 months for MGUS. Followed by Cardiologists every 6 months for CHF.  Hypertension This is a chronic problem. The current episode started more than 1 year ago. The problem has been resolved since onset. The problem is controlled. Associated symptoms include anxiety. Pertinent negatives include no headaches, malaise/fatigue, peripheral edema or shortness of breath. Risk factors for coronary artery disease include sedentary lifestyle. The current treatment provides moderate improvement. Hypertensive end-organ damage includes kidney  disease. There is no history of CVA or heart failure.  Gastroesophageal Reflux She complains of belching and heartburn. This is a chronic problem. The current episode started more than 1 year ago. The problem occurs occasionally. The problem has been waxing and waning. Associated symptoms include fatigue. She has tried an antacid and head elevation for the symptoms. The treatment provided moderate relief.  Insomnia Primary symptoms: no malaise/fatigue.  The current episode started more than one year. The onset quality is gradual. The problem occurs intermittently. The problem has been waxing and waning since onset. Past treatments include medication. The treatment provided moderate relief. PMH includes: depression.  Anxiety Presents for follow-up visit. Symptoms include depressed mood, excessive worry, insomnia, nervous/anxious behavior, panic and restlessness. Patient reports no shortness of breath. Symptoms occur most days. The severity of symptoms is moderate.    Depression        This is a chronic problem.  The current episode started more than 1 year ago.   The onset quality is gradual.   The problem occurs intermittently.  Associated symptoms include fatigue, insomnia, irritable, restlessness, decreased interest and sad.  Associated symptoms include no helplessness, no hopelessness and no headaches.  Past medical history includes anxiety.   Seizures  This is a chronic problem. Episode onset: last seizure was over a year ago. Pertinent negatives include no headaches.  Hyperlipidemia This is a chronic problem. The current episode started more than 1 year ago. Recent lipid tests were reviewed and are normal. Exacerbating diseases include obesity. Pertinent negatives include no shortness of breath. Current antihyperlipidemic treatment includes statins. The current treatment provides moderate improvement of lipids. Risk factors for coronary artery disease include dyslipidemia, hypertension, a  sedentary lifestyle and post-menopausal.  Congestive Heart Failure Presents for follow-up visit. Associated symptoms include fatigue. Pertinent negatives include no shortness of breath.  COPD Pt using Combivent as needed. Quit smoking 2010.    Review of Systems  Constitutional: Positive for fatigue. Negative for malaise/fatigue.  Respiratory: Negative for shortness of breath.   Gastrointestinal: Positive for heartburn.  Neurological: Positive for seizures. Negative for headaches.  Psychiatric/Behavioral: Positive for depression. The patient is nervous/anxious and has insomnia.      Observations/Objective: No SOB or distress noted   Assessment and Plan: [name] comes in today with chief complaint of No chief complaint on file.   Diagnosis and orders addressed:  1. Essential hypertension - CBC with Differential/Platelet; Future - BMP8+EGFR; Future - Hepatic function panel; Future  2. Chronic obstructive pulmonary disease, unspecified COPD type ([place]) - CBC with Differential/Platelet; Future - BMP8+EGFR; Future - Hepatic function panel; Future  3. Gastroesophageal reflux disease without esophagitis - CBC with Differential/Platelet; Future - BMP8+EGFR; Future - Hepatic function panel; Future  4. Chronic diastolic congestive heart failure (HCC) - CBC with Differential/Platelet; Future - BMP8+EGFR; Future - Hepatic function panel; Future  5. Primary osteoarthritis of right knee - CBC with Differential/Platelet; Future - BMP8+EGFR; Future - Hepatic function panel; Future  6. CKD (chronic kidney disease), stage IV (HCC - CBC with Differential/Platelet; Future - BMP8+EGFR; Future - Hepatic function panel; Future  7. Spinal stenosis, lumbar region with neurogenic claudication - CBC with Differential/Platelet; Future - BMP8+EGFR; Future - Hepatic function panel; Future  8. Severe episode of recurrent major depressive disorder, without psychotic features  ([place]) - CBC with Differential/Platelet; Future - BMP8+EGFR; Future - Hepatic function panel; Future  9. Seizures ([place]) - CBC with Differential/Platelet; Future - BMP8+EGFR; Future - Hepatic function panel; Future  10. Obesity (BMI 30-39.9) - CBC with Differential/Platelet; Future - BMP8+EGFR; Future - Hepatic function panel; Future  11. MGUS (monoclonal gammopathy of unknown significance) - CBC with Differential/Platelet; Future - BMP8+EGFR; Future - Hepatic function panel; Future  12. Insomnia, unspecified type - traZODone (DESYREL) 50 MG tablet; Take 1 tablet (50 mg total) by mouth at bedtime.  Dispense: 90 tablet; Refill: 1 - CBC with Differential/Platelet; Future - BMP8+EGFR; Future - Hepatic function panel; Future - zolpidem (AMBIEN) 5 MG tablet; Take 1 tablet (5 mg total) by mouth at bedtime as needed for sleep.  Dispense: 30 tablet; Refill: 5  13. Hyperlipidemia, unspecified hyperlipidemia type - CBC with Differential/Platelet; Future - BMP8+EGFR; Future - Hepatic function panel; Future - Lipid panel; Future  14. Chronic back pain, unspecified back location, unspecified back pain laterality - CBC with Differential/Platelet; Future - BMP8+EGFR; Future - Hepatic function panel; Future  15. Controlled substance agreement signed - CBC with Differential/Platelet; Future - BMP8+EGFR; Future - Hepatic function panel; Future   Labs pending Pt reviewed in [place] controlled database- No red flags noted Health Maintenance reviewed Diet and exercise encouraged  Follow up plan: 6 months      I discussed the assessment and treatment plan with the patient. The patient was provided an opportunity to ask questions and all were answered. The patient agreed with the plan and demonstrated an understanding of the instructions.   The patient was advised to call back or seek an in-person evaluation if the symptoms worsen or if the condition fails to improve as anticipated.  The  above assessment and management plan was discussed with the patient. The patient verbalized understanding of and has agreed to the management plan. Patient is aware to call the clinic  if symptoms persist or worsen. Patient is aware when to return to the clinic for a follow-up visit. Patient educated on when it is appropriate to go to the emergency department.   Time call ended:  9:38 AM   I provided 18 minutes of non-face-to-face time during this encounter.    Evelina Dun, FNP

## 2019-07-11 ENCOUNTER — Telehealth: Payer: Self-pay | Admitting: Pulmonary Disease

## 2019-07-12 NOTE — Telephone Encounter (Signed)
I cspoke with pt and she stated she didn't want to reschedule her sleep consult. FYI

## 2019-08-02 ENCOUNTER — Other Ambulatory Visit: Payer: Self-pay | Admitting: Family

## 2019-08-02 DIAGNOSIS — G629 Polyneuropathy, unspecified: Secondary | ICD-10-CM

## 2019-08-06 ENCOUNTER — Other Ambulatory Visit: Payer: Self-pay | Admitting: Family

## 2019-08-06 DIAGNOSIS — E785 Hyperlipidemia, unspecified: Secondary | ICD-10-CM

## 2019-08-07 ENCOUNTER — Other Ambulatory Visit (HOSPITAL_COMMUNITY): Payer: Self-pay | Admitting: Family

## 2019-08-07 DIAGNOSIS — Z1231 Encounter for screening mammogram for malignant neoplasm of breast: Secondary | ICD-10-CM

## 2019-08-16 ENCOUNTER — Other Ambulatory Visit: Payer: Self-pay

## 2019-08-19 ENCOUNTER — Ambulatory Visit (INDEPENDENT_AMBULATORY_CARE_PROVIDER_SITE_OTHER): Payer: Medicare Other | Admitting: Family

## 2019-08-19 ENCOUNTER — Encounter: Payer: Self-pay | Admitting: Family

## 2019-08-19 DIAGNOSIS — N184 Chronic kidney disease, stage 4 (severe): Secondary | ICD-10-CM | POA: Diagnosis not present

## 2019-08-19 DIAGNOSIS — I5032 Chronic diastolic (congestive) heart failure: Secondary | ICD-10-CM

## 2019-08-19 DIAGNOSIS — K219 Gastro-esophageal reflux disease without esophagitis: Secondary | ICD-10-CM

## 2019-08-19 DIAGNOSIS — I10 Essential (primary) hypertension: Secondary | ICD-10-CM

## 2019-08-19 DIAGNOSIS — R569 Unspecified convulsions: Secondary | ICD-10-CM

## 2019-08-19 DIAGNOSIS — G43709 Chronic migraine without aura, not intractable, without status migrainosus: Secondary | ICD-10-CM

## 2019-08-19 DIAGNOSIS — M1711 Unilateral primary osteoarthritis, right knee: Secondary | ICD-10-CM

## 2019-08-19 DIAGNOSIS — G4700 Insomnia, unspecified: Secondary | ICD-10-CM

## 2019-08-19 DIAGNOSIS — J449 Chronic obstructive pulmonary disease, unspecified: Secondary | ICD-10-CM

## 2019-08-19 DIAGNOSIS — E669 Obesity, unspecified: Secondary | ICD-10-CM

## 2019-08-19 DIAGNOSIS — IMO0002 Reserved for concepts with insufficient information to code with codable children: Secondary | ICD-10-CM

## 2019-08-19 DIAGNOSIS — E785 Hyperlipidemia, unspecified: Secondary | ICD-10-CM

## 2019-08-19 MED ORDER — LEVETIRACETAM 500 MG PO TABS: 500.0000 mg | ORAL_TABLET | Freq: Two times a day (BID) | ORAL | 2 refills | Status: AC

## 2019-08-19 MED ORDER — TRAZODONE HCL 50 MG PO TABS: 50.0000 mg | ORAL_TABLET | Freq: Every day | ORAL | 1 refills | Status: AC

## 2019-08-19 NOTE — Progress Notes (Signed)
Virtual Visit via telephone Note Due to COVID-19 pandemic this visit was conducted virtually. This visit type was conducted due to national recommendations for restrictions regarding the COVID-19 Pandemic (e.g. social distancing, sheltering in place) in an effort to limit this patient's exposure and mitigate transmission in our community. All issues noted in this document were discussed and addressed.  A physical exam was not performed with this format.  I connected with [name] on 08/19/19 at 8:55 AM by telephone and verified that I am speaking with the correct person using two identifiers. [name] is currently located at home and son is currently with her during visit. The provider, Evelina Dun, FNP is located in their office at time of visit.  I discussed the limitations, risks, security and privacy concerns of performing an evaluation and management service by telephone and the availability of in person appointments. I also discussed with the patient that there may be a patient responsible charge related to this service. The patient expressed understanding and agreed to proceed.   History and Present Illness:  PT calls the office today for chronic follow up. Pt is followed by Ortho as needed for chronic back pain, and left arthritis. She is followed by nephrologists every 6 months for chronic kidney disease. She is followed by Oncologists every 6 months for MGUS. Followed by Cardiologists every 6 months for CHF.  Pt states she has been off her Keppra since January 2020 because she "ran out". She reports she has not had a seizure in 2 years and has not seen a Neurologists in "awhile".   She reports a dry intermittent cough, so her visit was changed to a televisit today.  Hyperlipidemia This is a chronic problem. The current episode started more than 1 year ago. The problem is controlled. Recent lipid tests were reviewed and are normal. Exacerbating diseases include  obesity. Pertinent negatives include no shortness of breath. Current antihyperlipidemic treatment includes statins. The current treatment provides moderate improvement of lipids. Risk factors for coronary artery disease include dyslipidemia, hypertension and post-menopausal.  Hypertension This is a chronic problem. The current episode started more than 1 year ago. The problem has been waxing and waning since onset. The problem is controlled. Pertinent negatives include no malaise/fatigue, peripheral edema or shortness of breath. Risk factors for coronary artery disease include dyslipidemia, obesity and sedentary lifestyle. Past treatments include calcium channel blockers. The current treatment provides moderate improvement. Hypertensive end-organ damage includes heart failure. There is no history of kidney disease or CAD/MI.  Gastroesophageal Reflux She complains of belching, heartburn and nausea. This is a chronic problem. The current episode started more than 1 year ago. The problem occurs occasionally.  Arthritis Presents for follow-up visit. She complains of pain and stiffness. Affected locations include the right knee, left knee, right MCP and left MCP (back). Her pain is at a severity of 10/10.  Migraine  This is a chronic problem. The current episode started more than 1 year ago. The problem occurs monthly (2 times a month). The pain is located in the retro-orbital region. The pain quality is similar to prior headaches. The pain is moderate. Associated symptoms include insomnia, nausea, phonophobia, photophobia and seizures. Her past medical history is significant for hypertension and obesity.  Seizures  This is a chronic problem. The current episode started more than 1 week ago (two years ago). Associated symptoms include nausea.  Insomnia Primary symptoms: difficulty falling asleep, frequent awakening, no malaise/fatigue.  The current episode started  more than one year. The onset quality is  gradual. The problem occurs intermittently. The problem has been waxing and waning since onset.  COPD Pt takes Combivent daily. Pt quit smoking on 11/13/12.     Review of Systems  Constitutional: Negative for malaise/fatigue.  Eyes: Positive for photophobia.  Respiratory: Negative for shortness of breath.   Gastrointestinal: Positive for heartburn and nausea.  Musculoskeletal: Positive for arthritis and stiffness.  Neurological: Positive for seizures.  Psychiatric/Behavioral: The patient has insomnia.      Observations/Objective: No SOB or distress noted   Assessment and Plan: [name] comes in today with chief complaint of No chief complaint on file.   Diagnosis and orders addressed:  1. Essential hypertension  2. Chronic diastolic congestive heart failure ([place])  3. Chronic obstructive pulmonary disease, unspecified COPD type ([place])  4. Gastroesophageal reflux disease without esophagitis  5. CKD (chronic kidney disease), stage IV (HCC)  6. Obesity (BMI 30-39.9)  7. Primary osteoarthritis of right knee  8. Seizures ([place]) Will reorder, but she will follow up with neurologists  - levETIRAcetam (KEPPRA) 500 MG tablet; Take 1 tablet (500 mg total) by mouth 2 (two) times daily.  Dispense: 60 tablet; Refill: 2 - Ambulatory referral to Neurology  9. Insomnia, unspecified type Will stop Ambien today and given trazodone  Sleep ritual encouarged - traZODone (DESYREL) 50 MG tablet; Take 1 tablet (50 mg total) by mouth at bedtime.  Dispense: 90 tablet; Refill: 1  10. Hyperlipidemia, unspecified hyperlipidemia type  11. Chronic migraine - Ambulatory referral to Neurology   Labs pending- Pt will get labs drawn once cough resolves  Health Maintenance reviewed Diet and exercise encouraged  Follow up plan: 6 months         I discussed the assessment and treatment plan with the patient. The patient was provided an opportunity to ask questions and all were  answered. The patient agreed with the plan and demonstrated an understanding of the instructions.   The patient was advised to call back or seek an in-person evaluation if the symptoms worsen or if the condition fails to improve as anticipated.  The above assessment and management plan was discussed with the patient. The patient verbalized understanding of and has agreed to the management plan. Patient is aware to call the clinic if symptoms persist or worsen. Patient is aware when to return to the clinic for a follow-up visit. Patient educated on when it is appropriate to go to the emergency department.   Time call ended:  9:18 pm  I provided 23 minutes of non-face-to-face time during this encounter.    Evelina Dun, FNP

## 2019-08-26 ENCOUNTER — Ambulatory Visit (HOSPITAL_COMMUNITY): Payer: Medicare Other

## 2019-09-17 ENCOUNTER — Other Ambulatory Visit: Payer: Self-pay | Admitting: Family

## 2019-09-17 DIAGNOSIS — F32A Depression, unspecified: Secondary | ICD-10-CM

## 2019-09-17 DIAGNOSIS — F329 Major depressive disorder, single episode, unspecified: Secondary | ICD-10-CM

## 2019-09-23 ENCOUNTER — Other Ambulatory Visit: Payer: Self-pay

## 2019-09-23 ENCOUNTER — Ambulatory Visit (INDEPENDENT_AMBULATORY_CARE_PROVIDER_SITE_OTHER): Payer: Medicare Other | Admitting: Family

## 2019-09-23 ENCOUNTER — Other Ambulatory Visit: Payer: Self-pay | Admitting: Family

## 2019-09-23 ENCOUNTER — Encounter: Payer: Self-pay | Admitting: Family

## 2019-09-23 DIAGNOSIS — I10 Essential (primary) hypertension: Secondary | ICD-10-CM

## 2019-09-23 DIAGNOSIS — G629 Polyneuropathy, unspecified: Secondary | ICD-10-CM

## 2019-09-23 MED ORDER — HYDROCHLOROTHIAZIDE 25 MG PO TABS: 25.0000 mg | ORAL_TABLET | Freq: Every day | ORAL | 3 refills | Status: AC

## 2019-09-23 NOTE — Progress Notes (Signed)
   Virtual Visit via telephone Note Due to COVID-19 pandemic this visit was conducted virtually. This visit type was conducted due to national recommendations for restrictions regarding the COVID-19 Pandemic (e.g. social distancing, sheltering in place) in an effort to limit this patient's exposure and mitigate transmission in our community. All issues noted in this document were discussed and addressed.  A physical exam was not performed with this format.  I connected with [name] on 09/23/19 at 11:56 AM by telephone and verified that I am speaking with the correct person using two identifiers. [name] is currently located at home and no one is currently with her  during visit. The provider, Evelina Dun, FNP is located in their office at time of visit.  I discussed the limitations, risks, security and privacy concerns of performing an evaluation and management service by telephone and the availability of in person appointments. I also discussed with the patient that there may be a patient responsible charge related to this service. The patient expressed understanding and agreed to proceed.   History and Present Illness:  Hypertension This is a chronic problem. The current episode started more than 1 year ago. The problem is unchanged. The problem is uncontrolled. Associated symptoms include headaches and peripheral edema. Pertinent negatives include no malaise/fatigue, palpitations or shortness of breath. Past treatments include calcium channel blockers. The current treatment provides no improvement.      Review of Systems  Constitutional: Negative for malaise/fatigue.  Respiratory: Negative for shortness of breath.   Cardiovascular: Negative for palpitations.  Neurological: Positive for headaches.  All other systems reviewed and are negative.    Observations/Objective: No SOB or distress noted  Assessment and Plan: 1. Essential hypertension Continue Norvasc 5  mg and Metoprolol 25mg  BID  Follow up this week in office to have BP rechecked. Bring all of your medications.  Will hold off on increasing Norvasc to 10 mg because of swelling in bilateral feet She reports she has not been taking her hydralazine 50 mg TID - hydrochlorothiazide (HYDRODIURIL) 25 MG tablet; Take 1 tablet (25 mg total) by mouth daily.  Dispense: 90 tablet; Refill: 3     I discussed the assessment and treatment plan with the patient. The patient was provided an opportunity to ask questions and all were answered. The patient agreed with the plan and demonstrated an understanding of the instructions.   The patient was advised to call back or seek an in-person evaluation if the symptoms worsen or if the condition fails to improve as anticipated.  The above assessment and management plan was discussed with the patient. The patient verbalized understanding of and has agreed to the management plan. Patient is aware to call the clinic if symptoms persist or worsen. Patient is aware when to return to the clinic for a follow-up visit. Patient educated on when it is appropriate to go to the emergency department.   Time call ended: 12:12    I provided 16 minutes of non-face-to-face time during this encounter.    Evelina Dun, FNP

## 2019-09-26 ENCOUNTER — Encounter: Payer: Self-pay | Admitting: Family

## 2019-09-26 ENCOUNTER — Other Ambulatory Visit: Payer: Self-pay

## 2019-09-26 ENCOUNTER — Ambulatory Visit (INDEPENDENT_AMBULATORY_CARE_PROVIDER_SITE_OTHER): Payer: Medicare Other | Admitting: Family

## 2019-09-26 VITALS — BP 171/83 | HR 67 | Temp 96.9°F | Ht 67.0 in | Wt 220.0 lb

## 2019-09-26 DIAGNOSIS — Z23 Encounter for immunization: Secondary | ICD-10-CM

## 2019-09-26 DIAGNOSIS — I10 Essential (primary) hypertension: Secondary | ICD-10-CM | POA: Diagnosis not present

## 2019-09-26 DIAGNOSIS — I5032 Chronic diastolic (congestive) heart failure: Secondary | ICD-10-CM | POA: Diagnosis not present

## 2019-09-26 MED ORDER — METOPROLOL TARTRATE 25 MG PO TABS: 25.0000 mg | ORAL_TABLET | Freq: Two times a day (BID) | ORAL | 3 refills | Status: AC

## 2019-09-26 MED ORDER — AMLODIPINE BESYLATE 10 MG PO TABS: 10.0000 mg | ORAL_TABLET | Freq: Every day | ORAL | 3 refills | Status: AC

## 2019-09-26 MED ORDER — COMBIVENT RESPIMAT 20-100 MCG/ACT IN AERS: INHALATION_SPRAY | RESPIRATORY_TRACT | 4 refills | Status: AC

## 2019-09-26 NOTE — Patient Instructions (Signed)

## 2019-09-26 NOTE — Progress Notes (Signed)
Subjective:    Patient ID: [name], female    DOB: 1951/07/09, 68 y.o.   MRN: [id number]  Chief Complaint  Patient presents with  . Hypertension   PT presents to the office today to recheck HTN. PT's Bp is not at goal. She is currently taking Norvasc 5 mg, metoprolol  25 mg BID, and HCTZ 25 mg.   She reports she has not taken her hydralazine 50 mg TID. Hypertension This is a chronic problem. The current episode started more than 1 year ago. The problem has been waxing and waning since onset. The problem is uncontrolled. Associated symptoms include peripheral edema. Pertinent negatives include no headaches, malaise/fatigue or shortness of breath. Risk factors for coronary artery disease include obesity and sedentary lifestyle. Past treatments include calcium channel blockers, beta blockers and diuretics. The current treatment provides mild improvement.      Review of Systems  Constitutional: Negative for malaise/fatigue.  Respiratory: Negative for shortness of breath.   Neurological: Negative for headaches.  All other systems reviewed and are negative.      Objective:   Physical Exam Vitals signs reviewed.  Constitutional:      General: She is not in acute distress.    Appearance: She is well-developed.  HENT:     Head: Normocephalic and atraumatic.  Eyes:     Pupils: Pupils are equal, round, and reactive to light.  Neck:     Musculoskeletal: Normal range of motion and neck supple.     Thyroid: No thyromegaly.  Cardiovascular:     Rate and Rhythm: Normal rate and regular rhythm.     Heart sounds: Normal heart sounds. No murmur.  Pulmonary:     Effort: Pulmonary effort is normal. No respiratory distress.     Breath sounds: Normal breath sounds. No wheezing.  Abdominal:     General: Bowel sounds are normal. There is no distension.     Palpations: Abdomen is soft.     Tenderness: There is no abdominal tenderness.  Musculoskeletal: Normal range of motion.       General: No tenderness.     Right lower leg: Edema (2+) present.     Left lower leg: Edema (2+) present.  Skin:    General: Skin is warm and dry.  Neurological:     Mental Status: She is alert and oriented to person, place, and time.     Cranial Nerves: No cranial nerve deficit.     Deep Tendon Reflexes: Reflexes are normal and symmetric.  Psychiatric:        Behavior: Behavior normal.        Thought Content: Thought content normal.        Judgment: Judgment normal.     BP (!) 171/83   Pulse 67   Temp (!) 96.9 F (36.1 C) (Temporal)   Ht 5' 7" (1.702 m)   Wt 220 lb (99.8 kg)   SpO2 97%   BMI 34.46 kg/m      Assessment & Plan:  1. Chronic diastolic congestive heart failure (HCC) - Ipratropium-Albuterol (COMBIVENT RESPIMAT) 20-100 MCG/ACT AERS respimat; INHALE 2 PUFFS INTO THE LUNGS TWICE A DAY  Dispense: 4 g; Refill: 4  2. Essential hypertension Will increase Norvasc to 10 mg from 5 mg -Daily blood pressure log given with instructions on how to fill out and told to bring to next visit -Dash diet information given -Exercise encouraged - Stress Management  -Continue current meds -RTO in 2 weeks  - metoprolol tartrate (  LOPRESSOR) 25 MG tablet; Take 1 tablet (25 mg total) by mouth 2 (two) times daily.  Dispense: 180 tablet; Refill: 3 - BMP8+EGFR - amLODipine (NORVASC) 10 MG tablet; Take 1 tablet (10 mg total) by mouth daily.  Dispense: 90 tablet; Refill: [place], FNP

## 2019-09-27 ENCOUNTER — Other Ambulatory Visit: Payer: Self-pay | Admitting: Family

## 2019-09-27 DIAGNOSIS — R7989 Other specified abnormal findings of blood chemistry: Secondary | ICD-10-CM

## 2019-09-27 LAB — BMP8+EGFR
BUN/Creatinine Ratio: 10 — ABNORMAL LOW (ref 12–28)
BUN: 23 mg/dL (ref 8–27)
CO2: 23 mmol/L (ref 20–29)
Calcium: 7.3 mg/dL — ABNORMAL LOW (ref 8.7–10.3)
Chloride: 105 mmol/L (ref 96–106)
Creatinine, Ser: 2.28 mg/dL — ABNORMAL HIGH (ref 0.57–1.00)
GFR calc Af Amer: 25 mL/min/{1.73_m2} — ABNORMAL LOW (ref >59)
GFR calc non Af Amer: 21 mL/min/{1.73_m2} — ABNORMAL LOW (ref >59)
Glucose: 94 mg/dL (ref 65–99)
Potassium: 4.4 mmol/L (ref 3.5–5.2)
Sodium: 142 mmol/L (ref 134–144)

## 2019-09-30 ENCOUNTER — Other Ambulatory Visit: Payer: Self-pay

## 2019-09-30 ENCOUNTER — Other Ambulatory Visit: Payer: Medicare Other

## 2019-09-30 DIAGNOSIS — R7989 Other specified abnormal findings of blood chemistry: Secondary | ICD-10-CM | POA: Diagnosis not present

## 2019-10-01 LAB — BMP8+EGFR
BUN/Creatinine Ratio: 10 — ABNORMAL LOW (ref 12–28)
BUN: 27 mg/dL (ref 8–27)
CO2: 16 mmol/L — ABNORMAL LOW (ref 20–29)
Calcium: 7.1 mg/dL — ABNORMAL LOW (ref 8.7–10.3)
Chloride: 106 mmol/L (ref 96–106)
Creatinine, Ser: 2.73 mg/dL — ABNORMAL HIGH (ref 0.57–1.00)
GFR calc Af Amer: 20 mL/min/{1.73_m2} — ABNORMAL LOW (ref >59)
GFR calc non Af Amer: 17 mL/min/{1.73_m2} — ABNORMAL LOW (ref >59)
Glucose: 88 mg/dL (ref 65–99)
Potassium: 4.7 mmol/L (ref 3.5–5.2)
Sodium: 142 mmol/L (ref 134–144)

## 2019-10-02 ENCOUNTER — Other Ambulatory Visit: Payer: Self-pay | Admitting: Family

## 2019-10-02 DIAGNOSIS — F419 Anxiety disorder, unspecified: Secondary | ICD-10-CM

## 2019-10-08 ENCOUNTER — Encounter: Payer: Self-pay | Admitting: Family

## 2019-10-08 ENCOUNTER — Ambulatory Visit (INDEPENDENT_AMBULATORY_CARE_PROVIDER_SITE_OTHER): Payer: Medicare Other | Admitting: Family

## 2019-10-08 ENCOUNTER — Other Ambulatory Visit: Payer: Self-pay

## 2019-10-08 VITALS — BP 131/76 | HR 71 | Temp 97.3°F | Ht 67.0 in | Wt 208.0 lb

## 2019-10-08 DIAGNOSIS — I10 Essential (primary) hypertension: Secondary | ICD-10-CM

## 2019-10-08 DIAGNOSIS — F411 Generalized anxiety disorder: Secondary | ICD-10-CM

## 2019-10-08 MED ORDER — BUSPIRONE HCL 7.5 MG PO TABS: 7.5000 mg | ORAL_TABLET | Freq: Three times a day (TID) | ORAL | 3 refills | Status: AC

## 2019-10-08 NOTE — Patient Instructions (Signed)

## 2019-10-08 NOTE — Progress Notes (Signed)
Subjective:    Patient ID: [name], female    DOB: November 13, 1951, 68 y.o.   MRN: [id number]  Chief Complaint  Patient presents with  . Hypertension    recheck   Pt presents to the office today to recheck HTN. PT's BP is at goal!! She does report increase in her anxiety over the past few weeks.  Hypertension This is a chronic problem. The current episode started more than 1 year ago. The problem has been resolved since onset. The problem is controlled. Associated symptoms include anxiety, malaise/fatigue, palpitations and peripheral edema. Pertinent negatives include no shortness of breath. Risk factors for coronary artery disease include dyslipidemia, obesity and sedentary lifestyle. The current treatment provides moderate improvement.  Anxiety Presents for follow-up visit. Symptoms include depressed mood, excessive worry, irritability, nervous/anxious behavior, palpitations, panic and restlessness. Patient reports no hyperventilation or shortness of breath. Symptoms occur most days. The severity of symptoms is moderate. The quality of sleep is good.        Review of Systems  Constitutional: Positive for irritability and malaise/fatigue.  Respiratory: Negative for shortness of breath.   Cardiovascular: Positive for palpitations.  Psychiatric/Behavioral: The patient is nervous/anxious.   All other systems reviewed and are negative.      Objective:   Physical Exam Vitals signs reviewed.  Constitutional:      General: She is not in acute distress.    Appearance: She is well-developed.  HENT:     Head: Normocephalic and atraumatic.     Right Ear: Tympanic membrane normal.     Left Ear: Tympanic membrane normal.  Eyes:     Pupils: Pupils are equal, round, and reactive to light.  Neck:     Musculoskeletal: Normal range of motion and neck supple.     Thyroid: No thyromegaly.  Cardiovascular:     Rate and Rhythm: Normal rate and regular rhythm.     Heart sounds: Normal  heart sounds. No murmur.  Pulmonary:     Effort: Pulmonary effort is normal. No respiratory distress.     Breath sounds: Normal breath sounds. No wheezing.  Abdominal:     General: Bowel sounds are normal. There is no distension.     Palpations: Abdomen is soft.     Tenderness: There is no abdominal tenderness.  Musculoskeletal: Normal range of motion.        General: No tenderness.  Skin:    General: Skin is warm and dry.  Neurological:     Mental Status: She is alert and oriented to person, place, and time.     Cranial Nerves: No cranial nerve deficit.     Deep Tendon Reflexes: Reflexes are normal and symmetric.  Psychiatric:        Behavior: Behavior normal.        Thought Content: Thought content normal.        Judgment: Judgment normal.       BP 131/76   Pulse 71   Temp (!) 97.3 F (36.3 C) (Temporal)   Ht '5\' 7"'  (1.702 m)   Wt 208 lb (94.3 kg)   SpO2 98%   BMI 32.58 kg/m      Assessment & Plan:  [name] comes in today with chief complaint of Hypertension (recheck)   Diagnosis and orders addressed:  1. Essential hypertension At goal -Exercise encouraged - Stress Management  -Continue current meds -RTO in 3 months  - BMP8+EGFR  2. GAD (generalized anxiety disorder) Will increase Buspar 5 mg  to 7.5 mg Stress management discussed - busPIRone (BUSPAR) 7.5 MG tablet; Take 1 tablet (7.5 mg total) by mouth 3 (three) times daily.  Dispense: 90 tablet; Refill: 3 - BMP8+EGFR   Labs pending Health Maintenance reviewed Diet and exercise encouraged  Follow up plan: 3 months   Evelina Dun, FNP

## 2019-10-09 ENCOUNTER — Other Ambulatory Visit: Payer: Self-pay | Admitting: Family

## 2019-10-09 LAB — BMP8+EGFR
BUN/Creatinine Ratio: 11 — ABNORMAL LOW (ref 12–28)
BUN: 29 mg/dL — ABNORMAL HIGH (ref 8–27)
CO2: 15 mmol/L — ABNORMAL LOW (ref 20–29)
Calcium: 7.2 mg/dL — ABNORMAL LOW (ref 8.7–10.3)
Chloride: 104 mmol/L (ref 96–106)
Creatinine, Ser: 2.72 mg/dL — ABNORMAL HIGH (ref 0.57–1.00)
GFR calc Af Amer: 20 mL/min/{1.73_m2} — ABNORMAL LOW (ref >59)
GFR calc non Af Amer: 17 mL/min/{1.73_m2} — ABNORMAL LOW (ref >59)
Glucose: 98 mg/dL (ref 65–99)
Potassium: 4.6 mmol/L (ref 3.5–5.2)
Sodium: 145 mmol/L — ABNORMAL HIGH (ref 134–144)

## 2019-10-09 MED ORDER — CALCIUM CARBONATE 600 MG PO TABS: 600.0000 mg | ORAL_TABLET | Freq: Two times a day (BID) | ORAL | 2 refills | Status: AC

## 2019-10-31 ENCOUNTER — Other Ambulatory Visit: Payer: Self-pay

## 2019-10-31 ENCOUNTER — Other Ambulatory Visit: Payer: Self-pay | Admitting: Family

## 2019-10-31 ENCOUNTER — Ambulatory Visit (HOSPITAL_COMMUNITY)
Admission: RE | Admit: 2019-10-31 | Discharge: 2019-10-31 | Disposition: A | Discharge status: Home / Self Care | Payer: Medicare Other | Source: Ambulatory Visit | Attending: Family | Admitting: Family

## 2019-10-31 DIAGNOSIS — IMO0002 Reserved for concepts with insufficient information to code with codable children: Secondary | ICD-10-CM

## 2019-10-31 DIAGNOSIS — Z1231 Encounter for screening mammogram for malignant neoplasm of breast: Secondary | ICD-10-CM | POA: Diagnosis not present

## 2019-10-31 DIAGNOSIS — G629 Polyneuropathy, unspecified: Secondary | ICD-10-CM

## 2019-10-31 DIAGNOSIS — G43709 Chronic migraine without aura, not intractable, without status migrainosus: Secondary | ICD-10-CM

## 2019-11-01 ENCOUNTER — Other Ambulatory Visit: Payer: Self-pay | Admitting: Family

## 2019-11-01 DIAGNOSIS — IMO0002 Reserved for concepts with insufficient information to code with codable children: Secondary | ICD-10-CM

## 2019-11-01 DIAGNOSIS — G43709 Chronic migraine without aura, not intractable, without status migrainosus: Secondary | ICD-10-CM

## 2019-11-01 MED ORDER — TOPIRAMATE 15 MG PO CPSP: 30.0000 mg | ORAL_CAPSULE | Freq: Two times a day (BID) | ORAL | 0 refills | Status: AC

## 2019-11-14 ENCOUNTER — Encounter: Payer: Medicare Other | Admitting: *Deleted

## 2019-11-14 ENCOUNTER — Ambulatory Visit (INDEPENDENT_AMBULATORY_CARE_PROVIDER_SITE_OTHER): Payer: Medicare Other | Admitting: Family

## 2019-11-14 ENCOUNTER — Encounter: Payer: Self-pay | Admitting: Family

## 2019-11-14 DIAGNOSIS — R52 Pain, unspecified: Secondary | ICD-10-CM | POA: Diagnosis not present

## 2019-11-14 DIAGNOSIS — Z20828 Contact with and (suspected) exposure to other viral communicable diseases: Secondary | ICD-10-CM | POA: Diagnosis not present

## 2019-11-14 DIAGNOSIS — R05 Cough: Secondary | ICD-10-CM | POA: Diagnosis not present

## 2019-11-14 DIAGNOSIS — J441 Chronic obstructive pulmonary disease with (acute) exacerbation: Secondary | ICD-10-CM | POA: Diagnosis not present

## 2019-11-14 DIAGNOSIS — Z20822 Contact with and (suspected) exposure to covid-19: Secondary | ICD-10-CM

## 2019-11-14 MED ORDER — BENZONATATE 200 MG PO CAPS: 200.0000 mg | ORAL_CAPSULE | Freq: Three times a day (TID) | ORAL | 1 refills | Status: AC | PRN

## 2019-11-14 NOTE — Progress Notes (Signed)
Started the visit and pt wanted to rescheduled due to severe headache and scheduled with a provider.

## 2019-11-14 NOTE — Progress Notes (Signed)
   Virtual Visit via telephone Note Due to COVID-19 pandemic this visit was conducted virtually. This visit type was conducted due to national recommendations for restrictions regarding the COVID-19 Pandemic (e.g. social distancing, sheltering in place) in an effort to limit this patient's exposure and mitigate transmission in our community. All issues noted in this document were discussed and addressed.  A physical exam was not performed with this format.  I connected with [name] on 11/14/19 at 10:40 Am by telephone and verified that I am speaking with the correct person using two identifiers. [name] is currently located at home and no one is currently with her during visit. The provider, Evelina Dun, FNP is located in their office at time of visit.  I discussed the limitations, risks, security and privacy concerns of performing an evaluation and management service by telephone and the availability of in person appointments. I also discussed with the patient that there may be a patient responsible charge related to this service. The patient expressed understanding and agreed to proceed.   History and Present Illness:  Headache  This is a new problem. Associated symptoms include coughing, ear pain and a sore throat (Improved now). Pertinent negatives include no fever.  Cough This is a new problem. The current episode started in the past 7 days. The problem has been unchanged. The cough is non-productive. Associated symptoms include ear pain, headaches, myalgias, nasal congestion, postnasal drip and a sore throat (Improved now). Pertinent negatives include no chills, ear congestion, fever or shortness of breath. The symptoms are aggravated by lying down. She has tried OTC cough suppressant for the symptoms. The treatment provided mild relief.      Review of Systems  Constitutional: Negative for chills and fever.  HENT: Positive for ear pain, postnasal drip and sore  throat (Improved now).   Respiratory: Positive for cough. Negative for shortness of breath.   Musculoskeletal: Positive for myalgias.  Neurological: Positive for headaches.  All other systems reviewed and are negative.    Observations/Objective: No SOB or distress   Assessment and Plan: 1. Encounter by telehealth for suspected COVID-19 Rest Force fluids Self isolate until results of COVID test returns - benzonatate (TESSALON) 200 MG capsule; Take 1 capsule (200 mg total) by mouth 3 (three) times daily as needed.  Dispense: 30 capsule; Refill: 1 - MyChart COVID-19 home monitoring program; Future     I discussed the assessment and treatment plan with the patient. The patient was provided an opportunity to ask questions and all were answered. The patient agreed with the plan and demonstrated an understanding of the instructions.   The patient was advised to call back or seek an in-person evaluation if the symptoms worsen or if the condition fails to improve as anticipated.  The above assessment and management plan was discussed with the patient. The patient verbalized understanding of and has agreed to the management plan. Patient is aware to call the clinic if symptoms persist or worsen. Patient is aware when to return to the clinic for a follow-up visit. Patient educated on when it is appropriate to go to the emergency department.   Time call ended: 10:54 AM   I provided 14 minutes of non-face-to-face time during this encounter.    Evelina Dun, FNP

## 2019-11-16 DIAGNOSIS — R918 Other nonspecific abnormal finding of lung field: Secondary | ICD-10-CM | POA: Diagnosis not present

## 2019-11-16 DIAGNOSIS — Z431 Encounter for attention to gastrostomy: Secondary | ICD-10-CM | POA: Diagnosis not present

## 2019-11-16 DIAGNOSIS — R739 Hyperglycemia, unspecified: Secondary | ICD-10-CM | POA: Diagnosis not present

## 2019-11-16 DIAGNOSIS — J9622 Acute and chronic respiratory failure with hypercapnia: Secondary | ICD-10-CM | POA: Diagnosis not present

## 2019-11-16 DIAGNOSIS — J90 Pleural effusion, not elsewhere classified: Secondary | ICD-10-CM | POA: Diagnosis not present

## 2019-11-16 DIAGNOSIS — E870 Hyperosmolality and hypernatremia: Secondary | ICD-10-CM | POA: Diagnosis not present

## 2019-11-16 DIAGNOSIS — J9601 Acute respiratory failure with hypoxia: Secondary | ICD-10-CM | POA: Diagnosis not present

## 2019-11-16 DIAGNOSIS — J986 Disorders of diaphragm: Secondary | ICD-10-CM | POA: Diagnosis not present

## 2019-11-16 DIAGNOSIS — J449 Chronic obstructive pulmonary disease, unspecified: Secondary | ICD-10-CM | POA: Diagnosis not present

## 2019-11-16 DIAGNOSIS — Z66 Do not resuscitate: Secondary | ICD-10-CM | POA: Diagnosis not present

## 2019-11-16 DIAGNOSIS — J189 Pneumonia, unspecified organism: Secondary | ICD-10-CM | POA: Diagnosis not present

## 2019-11-16 DIAGNOSIS — E875 Hyperkalemia: Secondary | ICD-10-CM | POA: Diagnosis not present

## 2019-11-16 DIAGNOSIS — R0600 Dyspnea, unspecified: Secondary | ICD-10-CM | POA: Diagnosis not present

## 2019-11-16 DIAGNOSIS — Z049 Encounter for examination and observation for unspecified reason: Secondary | ICD-10-CM | POA: Diagnosis not present

## 2019-11-16 DIAGNOSIS — H748X3 Other specified disorders of middle ear and mastoid, bilateral: Secondary | ICD-10-CM | POA: Diagnosis not present

## 2019-11-16 DIAGNOSIS — A419 Sepsis, unspecified organism: Secondary | ICD-10-CM | POA: Diagnosis not present

## 2019-11-16 DIAGNOSIS — J440 Chronic obstructive pulmonary disease with acute lower respiratory infection: Secondary | ICD-10-CM | POA: Diagnosis not present

## 2019-11-16 DIAGNOSIS — I129 Hypertensive chronic kidney disease with stage 1 through stage 4 chronic kidney disease, or unspecified chronic kidney disease: Secondary | ICD-10-CM | POA: Diagnosis not present

## 2019-11-16 DIAGNOSIS — R0603 Acute respiratory distress: Secondary | ICD-10-CM | POA: Diagnosis not present

## 2019-11-16 DIAGNOSIS — J1289 Other viral pneumonia: Secondary | ICD-10-CM | POA: Diagnosis not present

## 2019-11-16 DIAGNOSIS — R6521 Severe sepsis with septic shock: Secondary | ICD-10-CM | POA: Diagnosis not present

## 2019-11-16 DIAGNOSIS — R253 Fasciculation: Secondary | ICD-10-CM | POA: Diagnosis not present

## 2019-11-16 DIAGNOSIS — J9600 Acute respiratory failure, unspecified whether with hypoxia or hypercapnia: Secondary | ICD-10-CM | POA: Diagnosis not present

## 2019-11-16 DIAGNOSIS — J9811 Atelectasis: Secondary | ICD-10-CM | POA: Diagnosis not present

## 2019-11-16 DIAGNOSIS — E872 Acidosis: Secondary | ICD-10-CM | POA: Diagnosis not present

## 2019-11-16 DIAGNOSIS — J156 Pneumonia due to other aerobic Gram-negative bacteria: Secondary | ICD-10-CM | POA: Diagnosis not present

## 2019-11-16 DIAGNOSIS — R0602 Shortness of breath: Secondary | ICD-10-CM | POA: Diagnosis not present

## 2019-11-16 DIAGNOSIS — U071 COVID-19: Secondary | ICD-10-CM | POA: Diagnosis not present

## 2019-11-16 DIAGNOSIS — R062 Wheezing: Secondary | ICD-10-CM | POA: Diagnosis not present

## 2019-11-16 DIAGNOSIS — I081 Rheumatic disorders of both mitral and tricuspid valves: Secondary | ICD-10-CM | POA: Diagnosis not present

## 2019-11-16 DIAGNOSIS — Z452 Encounter for adjustment and management of vascular access device: Secondary | ICD-10-CM | POA: Diagnosis not present

## 2019-11-16 DIAGNOSIS — Z4682 Encounter for fitting and adjustment of non-vascular catheter: Secondary | ICD-10-CM | POA: Diagnosis not present

## 2019-11-16 DIAGNOSIS — N184 Chronic kidney disease, stage 4 (severe): Secondary | ICD-10-CM | POA: Diagnosis not present

## 2019-11-16 DIAGNOSIS — N179 Acute kidney failure, unspecified: Secondary | ICD-10-CM | POA: Diagnosis not present

## 2019-11-16 DIAGNOSIS — R569 Unspecified convulsions: Secondary | ICD-10-CM | POA: Diagnosis not present

## 2019-11-16 DIAGNOSIS — R680 Hypothermia, not associated with low environmental temperature: Secondary | ICD-10-CM | POA: Diagnosis not present

## 2019-11-16 DIAGNOSIS — J984 Other disorders of lung: Secondary | ICD-10-CM | POA: Diagnosis not present

## 2019-11-16 DIAGNOSIS — J1282 Pneumonia due to coronavirus disease 2019: Secondary | ICD-10-CM | POA: Diagnosis not present

## 2019-11-16 DIAGNOSIS — R5381 Other malaise: Secondary | ICD-10-CM | POA: Diagnosis not present

## 2019-11-16 DIAGNOSIS — N170 Acute kidney failure with tubular necrosis: Secondary | ICD-10-CM | POA: Diagnosis not present

## 2019-11-16 DIAGNOSIS — I959 Hypotension, unspecified: Secondary | ICD-10-CM | POA: Diagnosis not present

## 2019-11-16 DIAGNOSIS — A4189 Other specified sepsis: Secondary | ICD-10-CM | POA: Diagnosis not present

## 2019-11-16 DIAGNOSIS — Z515 Encounter for palliative care: Secondary | ICD-10-CM | POA: Diagnosis not present

## 2019-11-16 DIAGNOSIS — R6520 Severe sepsis without septic shock: Secondary | ICD-10-CM | POA: Diagnosis not present

## 2019-11-16 DIAGNOSIS — G9340 Encephalopathy, unspecified: Secondary | ICD-10-CM | POA: Diagnosis not present

## 2019-11-16 DIAGNOSIS — E874 Mixed disorder of acid-base balance: Secondary | ICD-10-CM | POA: Diagnosis not present

## 2019-11-16 DIAGNOSIS — A4150 Gram-negative sepsis, unspecified: Secondary | ICD-10-CM | POA: Diagnosis not present

## 2019-11-16 DIAGNOSIS — J9602 Acute respiratory failure with hypercapnia: Secondary | ICD-10-CM | POA: Diagnosis not present

## 2019-11-17 DIAGNOSIS — E872 Acidosis: Secondary | ICD-10-CM | POA: Diagnosis not present

## 2019-11-17 DIAGNOSIS — I959 Hypotension, unspecified: Secondary | ICD-10-CM | POA: Diagnosis not present

## 2019-11-17 DIAGNOSIS — A419 Sepsis, unspecified organism: Secondary | ICD-10-CM | POA: Diagnosis not present

## 2019-11-17 DIAGNOSIS — J986 Disorders of diaphragm: Secondary | ICD-10-CM | POA: Diagnosis not present

## 2019-11-17 DIAGNOSIS — Z452 Encounter for adjustment and management of vascular access device: Secondary | ICD-10-CM | POA: Diagnosis not present

## 2019-11-17 DIAGNOSIS — R918 Other nonspecific abnormal finding of lung field: Secondary | ICD-10-CM | POA: Diagnosis not present

## 2019-11-17 DIAGNOSIS — R253 Fasciculation: Secondary | ICD-10-CM | POA: Diagnosis not present

## 2019-11-17 DIAGNOSIS — I129 Hypertensive chronic kidney disease with stage 1 through stage 4 chronic kidney disease, or unspecified chronic kidney disease: Secondary | ICD-10-CM | POA: Diagnosis not present

## 2019-11-17 DIAGNOSIS — N184 Chronic kidney disease, stage 4 (severe): Secondary | ICD-10-CM | POA: Diagnosis not present

## 2019-11-17 DIAGNOSIS — R739 Hyperglycemia, unspecified: Secondary | ICD-10-CM | POA: Diagnosis not present

## 2019-11-17 DIAGNOSIS — R0602 Shortness of breath: Secondary | ICD-10-CM | POA: Diagnosis not present

## 2019-11-17 DIAGNOSIS — J9601 Acute respiratory failure with hypoxia: Secondary | ICD-10-CM | POA: Diagnosis not present

## 2019-11-17 DIAGNOSIS — R6520 Severe sepsis without septic shock: Secondary | ICD-10-CM | POA: Diagnosis not present

## 2019-11-17 DIAGNOSIS — J984 Other disorders of lung: Secondary | ICD-10-CM | POA: Diagnosis not present

## 2019-11-17 DIAGNOSIS — N179 Acute kidney failure, unspecified: Secondary | ICD-10-CM | POA: Diagnosis not present

## 2019-11-17 DIAGNOSIS — Z4682 Encounter for fitting and adjustment of non-vascular catheter: Secondary | ICD-10-CM | POA: Diagnosis not present

## 2019-11-17 DIAGNOSIS — J449 Chronic obstructive pulmonary disease, unspecified: Secondary | ICD-10-CM | POA: Diagnosis not present

## 2019-11-17 MED ORDER — NOREPINEPHRINE-SODIUM CHLORIDE 8-0.9 MG/250ML-% IV SOLN
0.10 | INTRAVENOUS | Status: DC
Start: ? — End: 2019-11-17

## 2019-11-17 MED ORDER — GENERIC EXTERNAL MEDICATION
0.04 | Status: DC
Start: ? — End: 2019-11-17

## 2019-11-17 MED ORDER — GENERIC EXTERNAL MEDICATION
1.00 | Status: DC
Start: 2019-11-17 — End: 2019-11-17

## 2019-11-17 MED ORDER — GENERIC EXTERNAL MEDICATION
150.00 | Status: DC
Start: 2019-11-17 — End: 2019-11-17

## 2019-11-17 MED ORDER — SODIUM CHLORIDE 0.9 % IV SOLN
250.00 | INTRAVENOUS | Status: DC
Start: ? — End: 2019-11-17

## 2019-11-17 MED ORDER — HYDROCORTISONE NA SUCCINATE PF 100 MG IJ SOLR
50.00 | INTRAMUSCULAR | Status: DC
Start: 2019-11-17 — End: 2019-11-17

## 2019-11-17 MED ORDER — PROPOFOL 200 MG/20ML IV EMUL
1.00 | INTRAVENOUS | Status: DC
Start: ? — End: 2019-11-17

## 2019-11-18 DIAGNOSIS — E872 Acidosis: Secondary | ICD-10-CM | POA: Diagnosis not present

## 2019-11-18 DIAGNOSIS — N184 Chronic kidney disease, stage 4 (severe): Secondary | ICD-10-CM | POA: Diagnosis not present

## 2019-11-18 DIAGNOSIS — I081 Rheumatic disorders of both mitral and tricuspid valves: Secondary | ICD-10-CM | POA: Diagnosis not present

## 2019-11-18 DIAGNOSIS — R918 Other nonspecific abnormal finding of lung field: Secondary | ICD-10-CM | POA: Diagnosis not present

## 2019-11-18 DIAGNOSIS — N179 Acute kidney failure, unspecified: Secondary | ICD-10-CM | POA: Diagnosis not present

## 2019-11-18 DIAGNOSIS — J189 Pneumonia, unspecified organism: Secondary | ICD-10-CM | POA: Diagnosis not present

## 2019-11-18 DIAGNOSIS — J9601 Acute respiratory failure with hypoxia: Secondary | ICD-10-CM | POA: Diagnosis not present

## 2019-11-18 DIAGNOSIS — J449 Chronic obstructive pulmonary disease, unspecified: Secondary | ICD-10-CM | POA: Diagnosis not present

## 2019-11-18 DIAGNOSIS — J986 Disorders of diaphragm: Secondary | ICD-10-CM | POA: Diagnosis not present

## 2019-11-19 DIAGNOSIS — J9601 Acute respiratory failure with hypoxia: Secondary | ICD-10-CM | POA: Diagnosis not present

## 2019-11-19 DIAGNOSIS — J1289 Other viral pneumonia: Secondary | ICD-10-CM | POA: Diagnosis not present

## 2019-11-19 DIAGNOSIS — J189 Pneumonia, unspecified organism: Secondary | ICD-10-CM | POA: Diagnosis not present

## 2019-11-19 DIAGNOSIS — J449 Chronic obstructive pulmonary disease, unspecified: Secondary | ICD-10-CM | POA: Diagnosis not present

## 2019-11-19 DIAGNOSIS — E872 Acidosis: Secondary | ICD-10-CM | POA: Diagnosis not present

## 2019-11-19 DIAGNOSIS — U071 COVID-19: Secondary | ICD-10-CM | POA: Diagnosis not present

## 2019-11-19 DIAGNOSIS — J9811 Atelectasis: Secondary | ICD-10-CM | POA: Diagnosis not present

## 2019-11-19 DIAGNOSIS — N184 Chronic kidney disease, stage 4 (severe): Secondary | ICD-10-CM | POA: Diagnosis not present

## 2019-11-19 DIAGNOSIS — N179 Acute kidney failure, unspecified: Secondary | ICD-10-CM | POA: Diagnosis not present

## 2019-11-20 DIAGNOSIS — J1289 Other viral pneumonia: Secondary | ICD-10-CM | POA: Diagnosis not present

## 2019-11-20 DIAGNOSIS — J189 Pneumonia, unspecified organism: Secondary | ICD-10-CM | POA: Diagnosis not present

## 2019-11-20 DIAGNOSIS — E872 Acidosis: Secondary | ICD-10-CM | POA: Diagnosis not present

## 2019-11-20 DIAGNOSIS — J9601 Acute respiratory failure with hypoxia: Secondary | ICD-10-CM | POA: Diagnosis not present

## 2019-11-20 DIAGNOSIS — U071 COVID-19: Secondary | ICD-10-CM | POA: Diagnosis not present

## 2019-11-20 DIAGNOSIS — N184 Chronic kidney disease, stage 4 (severe): Secondary | ICD-10-CM | POA: Diagnosis not present

## 2019-11-20 DIAGNOSIS — N179 Acute kidney failure, unspecified: Secondary | ICD-10-CM | POA: Diagnosis not present

## 2019-11-20 DIAGNOSIS — J449 Chronic obstructive pulmonary disease, unspecified: Secondary | ICD-10-CM | POA: Diagnosis not present

## 2019-11-21 DIAGNOSIS — J449 Chronic obstructive pulmonary disease, unspecified: Secondary | ICD-10-CM | POA: Diagnosis not present

## 2019-11-21 DIAGNOSIS — J1289 Other viral pneumonia: Secondary | ICD-10-CM | POA: Diagnosis not present

## 2019-11-21 DIAGNOSIS — J9811 Atelectasis: Secondary | ICD-10-CM | POA: Diagnosis not present

## 2019-11-21 DIAGNOSIS — N179 Acute kidney failure, unspecified: Secondary | ICD-10-CM | POA: Diagnosis not present

## 2019-11-21 DIAGNOSIS — J9601 Acute respiratory failure with hypoxia: Secondary | ICD-10-CM | POA: Diagnosis not present

## 2019-11-21 DIAGNOSIS — N184 Chronic kidney disease, stage 4 (severe): Secondary | ICD-10-CM | POA: Diagnosis not present

## 2019-11-21 DIAGNOSIS — U071 COVID-19: Secondary | ICD-10-CM | POA: Diagnosis not present

## 2019-11-21 DIAGNOSIS — J189 Pneumonia, unspecified organism: Secondary | ICD-10-CM | POA: Diagnosis not present

## 2019-11-22 DIAGNOSIS — J189 Pneumonia, unspecified organism: Secondary | ICD-10-CM | POA: Diagnosis not present

## 2019-11-22 DIAGNOSIS — J1289 Other viral pneumonia: Secondary | ICD-10-CM | POA: Diagnosis not present

## 2019-11-22 DIAGNOSIS — J9601 Acute respiratory failure with hypoxia: Secondary | ICD-10-CM | POA: Diagnosis not present

## 2019-11-22 DIAGNOSIS — U071 COVID-19: Secondary | ICD-10-CM | POA: Diagnosis not present

## 2019-11-22 DIAGNOSIS — J449 Chronic obstructive pulmonary disease, unspecified: Secondary | ICD-10-CM | POA: Diagnosis not present

## 2019-11-23 DIAGNOSIS — J9601 Acute respiratory failure with hypoxia: Secondary | ICD-10-CM | POA: Diagnosis not present

## 2019-11-23 DIAGNOSIS — J189 Pneumonia, unspecified organism: Secondary | ICD-10-CM | POA: Diagnosis not present

## 2019-11-23 DIAGNOSIS — R918 Other nonspecific abnormal finding of lung field: Secondary | ICD-10-CM | POA: Diagnosis not present

## 2019-11-23 DIAGNOSIS — J90 Pleural effusion, not elsewhere classified: Secondary | ICD-10-CM | POA: Diagnosis not present

## 2019-11-23 DIAGNOSIS — U071 COVID-19: Secondary | ICD-10-CM | POA: Diagnosis not present

## 2019-11-23 DIAGNOSIS — J1289 Other viral pneumonia: Secondary | ICD-10-CM | POA: Diagnosis not present

## 2019-11-23 DIAGNOSIS — J449 Chronic obstructive pulmonary disease, unspecified: Secondary | ICD-10-CM | POA: Diagnosis not present

## 2019-11-23 DIAGNOSIS — H748X3 Other specified disorders of middle ear and mastoid, bilateral: Secondary | ICD-10-CM | POA: Diagnosis not present

## 2019-11-23 DIAGNOSIS — Z431 Encounter for attention to gastrostomy: Secondary | ICD-10-CM | POA: Diagnosis not present

## 2019-11-24 DIAGNOSIS — N179 Acute kidney failure, unspecified: Secondary | ICD-10-CM | POA: Diagnosis not present

## 2019-11-24 DIAGNOSIS — J9601 Acute respiratory failure with hypoxia: Secondary | ICD-10-CM | POA: Diagnosis not present

## 2019-11-24 DIAGNOSIS — J449 Chronic obstructive pulmonary disease, unspecified: Secondary | ICD-10-CM | POA: Diagnosis not present

## 2019-11-24 DIAGNOSIS — U071 COVID-19: Secondary | ICD-10-CM | POA: Diagnosis not present

## 2019-11-24 DIAGNOSIS — J986 Disorders of diaphragm: Secondary | ICD-10-CM | POA: Diagnosis not present

## 2019-11-24 DIAGNOSIS — Z4682 Encounter for fitting and adjustment of non-vascular catheter: Secondary | ICD-10-CM | POA: Diagnosis not present

## 2019-11-24 DIAGNOSIS — J1289 Other viral pneumonia: Secondary | ICD-10-CM | POA: Diagnosis not present

## 2019-11-24 DIAGNOSIS — J189 Pneumonia, unspecified organism: Secondary | ICD-10-CM | POA: Diagnosis not present

## 2019-11-25 DIAGNOSIS — J189 Pneumonia, unspecified organism: Secondary | ICD-10-CM | POA: Diagnosis not present

## 2019-11-25 DIAGNOSIS — N179 Acute kidney failure, unspecified: Secondary | ICD-10-CM | POA: Diagnosis not present

## 2019-11-25 DIAGNOSIS — U071 COVID-19: Secondary | ICD-10-CM | POA: Diagnosis not present

## 2019-11-25 DIAGNOSIS — J986 Disorders of diaphragm: Secondary | ICD-10-CM | POA: Diagnosis not present

## 2019-11-25 DIAGNOSIS — J9601 Acute respiratory failure with hypoxia: Secondary | ICD-10-CM | POA: Diagnosis not present

## 2019-11-25 DIAGNOSIS — E872 Acidosis: Secondary | ICD-10-CM | POA: Diagnosis not present

## 2019-11-25 DIAGNOSIS — N184 Chronic kidney disease, stage 4 (severe): Secondary | ICD-10-CM | POA: Diagnosis not present

## 2019-11-25 DIAGNOSIS — J449 Chronic obstructive pulmonary disease, unspecified: Secondary | ICD-10-CM | POA: Diagnosis not present

## 2019-11-26 DIAGNOSIS — E872 Acidosis: Secondary | ICD-10-CM | POA: Diagnosis not present

## 2019-11-26 DIAGNOSIS — J449 Chronic obstructive pulmonary disease, unspecified: Secondary | ICD-10-CM | POA: Diagnosis not present

## 2019-11-26 DIAGNOSIS — N184 Chronic kidney disease, stage 4 (severe): Secondary | ICD-10-CM | POA: Diagnosis not present

## 2019-11-26 DIAGNOSIS — J986 Disorders of diaphragm: Secondary | ICD-10-CM | POA: Diagnosis not present

## 2019-11-26 DIAGNOSIS — U071 COVID-19: Secondary | ICD-10-CM | POA: Diagnosis not present

## 2019-11-26 DIAGNOSIS — N179 Acute kidney failure, unspecified: Secondary | ICD-10-CM | POA: Diagnosis not present

## 2019-11-26 DIAGNOSIS — J9601 Acute respiratory failure with hypoxia: Secondary | ICD-10-CM | POA: Diagnosis not present

## 2019-11-26 DIAGNOSIS — R918 Other nonspecific abnormal finding of lung field: Secondary | ICD-10-CM | POA: Diagnosis not present

## 2019-11-27 DIAGNOSIS — J984 Other disorders of lung: Secondary | ICD-10-CM | POA: Diagnosis not present

## 2019-11-27 DIAGNOSIS — J9601 Acute respiratory failure with hypoxia: Secondary | ICD-10-CM | POA: Diagnosis not present

## 2019-11-27 DIAGNOSIS — N184 Chronic kidney disease, stage 4 (severe): Secondary | ICD-10-CM | POA: Diagnosis not present

## 2019-11-27 DIAGNOSIS — J449 Chronic obstructive pulmonary disease, unspecified: Secondary | ICD-10-CM | POA: Diagnosis not present

## 2019-11-27 DIAGNOSIS — U071 COVID-19: Secondary | ICD-10-CM | POA: Diagnosis not present

## 2019-11-27 DIAGNOSIS — J189 Pneumonia, unspecified organism: Secondary | ICD-10-CM | POA: Diagnosis not present

## 2019-11-27 DIAGNOSIS — J9602 Acute respiratory failure with hypercapnia: Secondary | ICD-10-CM | POA: Diagnosis not present

## 2019-11-27 DIAGNOSIS — E872 Acidosis: Secondary | ICD-10-CM | POA: Diagnosis not present

## 2019-11-27 DIAGNOSIS — N179 Acute kidney failure, unspecified: Secondary | ICD-10-CM | POA: Diagnosis not present

## 2019-11-28 DIAGNOSIS — E872 Acidosis: Secondary | ICD-10-CM | POA: Diagnosis not present

## 2019-11-28 DIAGNOSIS — J189 Pneumonia, unspecified organism: Secondary | ICD-10-CM | POA: Diagnosis not present

## 2019-11-28 DIAGNOSIS — J449 Chronic obstructive pulmonary disease, unspecified: Secondary | ICD-10-CM | POA: Diagnosis not present

## 2019-11-28 DIAGNOSIS — A419 Sepsis, unspecified organism: Secondary | ICD-10-CM | POA: Diagnosis not present

## 2019-11-28 DIAGNOSIS — J1289 Other viral pneumonia: Secondary | ICD-10-CM | POA: Diagnosis not present

## 2019-11-28 DIAGNOSIS — N179 Acute kidney failure, unspecified: Secondary | ICD-10-CM | POA: Diagnosis not present

## 2019-11-28 DIAGNOSIS — J9601 Acute respiratory failure with hypoxia: Secondary | ICD-10-CM | POA: Diagnosis not present

## 2019-11-28 DIAGNOSIS — U071 COVID-19: Secondary | ICD-10-CM | POA: Diagnosis not present

## 2019-11-29 DIAGNOSIS — J1282 Pneumonia due to Coronavirus disease 2019: Secondary | ICD-10-CM | POA: Diagnosis not present

## 2019-11-29 DIAGNOSIS — A419 Sepsis, unspecified organism: Secondary | ICD-10-CM | POA: Diagnosis not present

## 2019-11-29 DIAGNOSIS — J9601 Acute respiratory failure with hypoxia: Secondary | ICD-10-CM | POA: Diagnosis not present

## 2019-11-29 DIAGNOSIS — J986 Disorders of diaphragm: Secondary | ICD-10-CM | POA: Diagnosis not present

## 2019-11-29 DIAGNOSIS — J189 Pneumonia, unspecified organism: Secondary | ICD-10-CM | POA: Diagnosis not present

## 2019-11-29 DIAGNOSIS — U071 COVID-19: Secondary | ICD-10-CM | POA: Diagnosis not present

## 2019-11-29 DIAGNOSIS — N179 Acute kidney failure, unspecified: Secondary | ICD-10-CM | POA: Diagnosis not present

## 2019-11-29 DIAGNOSIS — E872 Acidosis: Secondary | ICD-10-CM | POA: Diagnosis not present

## 2019-11-29 DIAGNOSIS — J449 Chronic obstructive pulmonary disease, unspecified: Secondary | ICD-10-CM | POA: Diagnosis not present

## 2019-11-30 DIAGNOSIS — U071 COVID-19: Secondary | ICD-10-CM | POA: Diagnosis not present

## 2019-11-30 DIAGNOSIS — J189 Pneumonia, unspecified organism: Secondary | ICD-10-CM | POA: Diagnosis not present

## 2019-11-30 DIAGNOSIS — E872 Acidosis: Secondary | ICD-10-CM | POA: Diagnosis not present

## 2019-11-30 DIAGNOSIS — N179 Acute kidney failure, unspecified: Secondary | ICD-10-CM | POA: Diagnosis not present

## 2019-11-30 DIAGNOSIS — J90 Pleural effusion, not elsewhere classified: Secondary | ICD-10-CM | POA: Diagnosis not present

## 2019-11-30 DIAGNOSIS — J1282 Pneumonia due to Coronavirus disease 2019: Secondary | ICD-10-CM | POA: Diagnosis not present

## 2019-11-30 DIAGNOSIS — J9601 Acute respiratory failure with hypoxia: Secondary | ICD-10-CM | POA: Diagnosis not present

## 2019-11-30 DIAGNOSIS — J449 Chronic obstructive pulmonary disease, unspecified: Secondary | ICD-10-CM | POA: Diagnosis not present

## 2019-11-30 DIAGNOSIS — A419 Sepsis, unspecified organism: Secondary | ICD-10-CM | POA: Diagnosis not present

## 2019-12-01 DIAGNOSIS — U071 COVID-19: Secondary | ICD-10-CM | POA: Diagnosis not present

## 2019-12-01 DIAGNOSIS — A419 Sepsis, unspecified organism: Secondary | ICD-10-CM | POA: Diagnosis not present

## 2019-12-01 DIAGNOSIS — J1282 Pneumonia due to Coronavirus disease 2019: Secondary | ICD-10-CM | POA: Diagnosis not present

## 2019-12-01 DIAGNOSIS — N179 Acute kidney failure, unspecified: Secondary | ICD-10-CM | POA: Diagnosis not present

## 2019-12-01 DIAGNOSIS — E872 Acidosis: Secondary | ICD-10-CM | POA: Diagnosis not present

## 2019-12-01 DIAGNOSIS — J90 Pleural effusion, not elsewhere classified: Secondary | ICD-10-CM | POA: Diagnosis not present

## 2019-12-01 DIAGNOSIS — J449 Chronic obstructive pulmonary disease, unspecified: Secondary | ICD-10-CM | POA: Diagnosis not present

## 2019-12-01 DIAGNOSIS — J189 Pneumonia, unspecified organism: Secondary | ICD-10-CM | POA: Diagnosis not present

## 2019-12-01 DIAGNOSIS — J9601 Acute respiratory failure with hypoxia: Secondary | ICD-10-CM | POA: Diagnosis not present

## 2019-12-02 DIAGNOSIS — J449 Chronic obstructive pulmonary disease, unspecified: Secondary | ICD-10-CM | POA: Diagnosis not present

## 2019-12-02 DIAGNOSIS — Z4682 Encounter for fitting and adjustment of non-vascular catheter: Secondary | ICD-10-CM | POA: Diagnosis not present

## 2019-12-02 DIAGNOSIS — N184 Chronic kidney disease, stage 4 (severe): Secondary | ICD-10-CM | POA: Diagnosis not present

## 2019-12-02 DIAGNOSIS — N179 Acute kidney failure, unspecified: Secondary | ICD-10-CM | POA: Diagnosis not present

## 2019-12-02 DIAGNOSIS — E872 Acidosis: Secondary | ICD-10-CM | POA: Diagnosis not present

## 2019-12-02 DIAGNOSIS — J9601 Acute respiratory failure with hypoxia: Secondary | ICD-10-CM | POA: Diagnosis not present

## 2019-12-02 DIAGNOSIS — U071 COVID-19: Secondary | ICD-10-CM | POA: Diagnosis not present

## 2019-12-02 DIAGNOSIS — J1282 Pneumonia due to Coronavirus disease 2019: Secondary | ICD-10-CM | POA: Diagnosis not present

## 2019-12-03 DIAGNOSIS — N179 Acute kidney failure, unspecified: Secondary | ICD-10-CM | POA: Diagnosis not present

## 2019-12-03 DIAGNOSIS — E872 Acidosis: Secondary | ICD-10-CM | POA: Diagnosis not present

## 2019-12-03 DIAGNOSIS — U071 COVID-19: Secondary | ICD-10-CM | POA: Diagnosis not present

## 2019-12-03 DIAGNOSIS — J9601 Acute respiratory failure with hypoxia: Secondary | ICD-10-CM | POA: Diagnosis not present

## 2019-12-04 DIAGNOSIS — U071 COVID-19: Secondary | ICD-10-CM | POA: Diagnosis not present

## 2019-12-04 DIAGNOSIS — N179 Acute kidney failure, unspecified: Secondary | ICD-10-CM | POA: Diagnosis not present

## 2019-12-04 DIAGNOSIS — J9601 Acute respiratory failure with hypoxia: Secondary | ICD-10-CM | POA: Diagnosis not present

## 2019-12-04 DIAGNOSIS — E872 Acidosis: Secondary | ICD-10-CM | POA: Diagnosis not present

## 2019-12-05 DIAGNOSIS — Z4682 Encounter for fitting and adjustment of non-vascular catheter: Secondary | ICD-10-CM | POA: Diagnosis not present

## 2019-12-05 DIAGNOSIS — E872 Acidosis: Secondary | ICD-10-CM | POA: Diagnosis not present

## 2019-12-05 DIAGNOSIS — J9601 Acute respiratory failure with hypoxia: Secondary | ICD-10-CM | POA: Diagnosis not present

## 2019-12-05 DIAGNOSIS — U071 COVID-19: Secondary | ICD-10-CM | POA: Diagnosis not present

## 2019-12-05 DIAGNOSIS — N179 Acute kidney failure, unspecified: Secondary | ICD-10-CM | POA: Diagnosis not present

## 2019-12-06 DIAGNOSIS — J9601 Acute respiratory failure with hypoxia: Secondary | ICD-10-CM | POA: Diagnosis not present

## 2019-12-06 DIAGNOSIS — E872 Acidosis: Secondary | ICD-10-CM | POA: Diagnosis not present

## 2019-12-06 DIAGNOSIS — U071 COVID-19: Secondary | ICD-10-CM | POA: Diagnosis not present

## 2019-12-06 DIAGNOSIS — N179 Acute kidney failure, unspecified: Secondary | ICD-10-CM | POA: Diagnosis not present

## 2019-12-07 DIAGNOSIS — J984 Other disorders of lung: Secondary | ICD-10-CM | POA: Diagnosis not present

## 2019-12-07 DIAGNOSIS — E872 Acidosis: Secondary | ICD-10-CM | POA: Diagnosis not present

## 2019-12-07 DIAGNOSIS — N179 Acute kidney failure, unspecified: Secondary | ICD-10-CM | POA: Diagnosis not present

## 2019-12-07 DIAGNOSIS — U071 COVID-19: Secondary | ICD-10-CM | POA: Diagnosis not present

## 2019-12-07 DIAGNOSIS — R0602 Shortness of breath: Secondary | ICD-10-CM | POA: Diagnosis not present

## 2019-12-07 DIAGNOSIS — J9601 Acute respiratory failure with hypoxia: Secondary | ICD-10-CM | POA: Diagnosis not present

## 2019-12-08 DIAGNOSIS — J9601 Acute respiratory failure with hypoxia: Secondary | ICD-10-CM | POA: Diagnosis not present

## 2019-12-08 DIAGNOSIS — U071 COVID-19: Secondary | ICD-10-CM | POA: Diagnosis not present

## 2019-12-08 DIAGNOSIS — N179 Acute kidney failure, unspecified: Secondary | ICD-10-CM | POA: Diagnosis not present

## 2019-12-08 DIAGNOSIS — E872 Acidosis: Secondary | ICD-10-CM | POA: Diagnosis not present

## 2019-12-09 DIAGNOSIS — N179 Acute kidney failure, unspecified: Secondary | ICD-10-CM | POA: Diagnosis not present

## 2019-12-09 DIAGNOSIS — E872 Acidosis: Secondary | ICD-10-CM | POA: Diagnosis not present

## 2019-12-09 DIAGNOSIS — J9601 Acute respiratory failure with hypoxia: Secondary | ICD-10-CM | POA: Diagnosis not present

## 2019-12-09 DIAGNOSIS — U071 COVID-19: Secondary | ICD-10-CM | POA: Diagnosis not present

## 2019-12-10 DIAGNOSIS — J189 Pneumonia, unspecified organism: Secondary | ICD-10-CM | POA: Diagnosis not present

## 2019-12-10 DIAGNOSIS — U071 COVID-19: Secondary | ICD-10-CM | POA: Diagnosis not present

## 2019-12-10 DIAGNOSIS — E872 Acidosis: Secondary | ICD-10-CM | POA: Diagnosis not present

## 2019-12-10 DIAGNOSIS — J9601 Acute respiratory failure with hypoxia: Secondary | ICD-10-CM | POA: Diagnosis not present

## 2019-12-10 DIAGNOSIS — N179 Acute kidney failure, unspecified: Secondary | ICD-10-CM | POA: Diagnosis not present

## 2019-12-11 DIAGNOSIS — J9601 Acute respiratory failure with hypoxia: Secondary | ICD-10-CM | POA: Diagnosis not present

## 2019-12-11 DIAGNOSIS — E872 Acidosis: Secondary | ICD-10-CM | POA: Diagnosis not present

## 2019-12-11 DIAGNOSIS — N179 Acute kidney failure, unspecified: Secondary | ICD-10-CM | POA: Diagnosis not present

## 2019-12-11 DIAGNOSIS — U071 COVID-19: Secondary | ICD-10-CM | POA: Diagnosis not present

## 2019-12-12 DIAGNOSIS — N179 Acute kidney failure, unspecified: Secondary | ICD-10-CM | POA: Diagnosis not present

## 2019-12-12 DIAGNOSIS — J9601 Acute respiratory failure with hypoxia: Secondary | ICD-10-CM | POA: Diagnosis not present

## 2019-12-12 DIAGNOSIS — E872 Acidosis: Secondary | ICD-10-CM | POA: Diagnosis not present

## 2019-12-12 DIAGNOSIS — U071 COVID-19: Secondary | ICD-10-CM | POA: Diagnosis not present

## 2019-12-13 DIAGNOSIS — N179 Acute kidney failure, unspecified: Secondary | ICD-10-CM | POA: Diagnosis not present

## 2019-12-13 DIAGNOSIS — J9601 Acute respiratory failure with hypoxia: Secondary | ICD-10-CM | POA: Diagnosis not present

## 2019-12-13 DIAGNOSIS — E872 Acidosis: Secondary | ICD-10-CM | POA: Diagnosis not present

## 2019-12-13 DIAGNOSIS — U071 COVID-19: Secondary | ICD-10-CM | POA: Diagnosis not present

## 2019-12-14 DIAGNOSIS — U071 COVID-19: Secondary | ICD-10-CM | POA: Diagnosis not present

## 2019-12-14 DIAGNOSIS — J986 Disorders of diaphragm: Secondary | ICD-10-CM | POA: Diagnosis not present

## 2019-12-14 DIAGNOSIS — J9601 Acute respiratory failure with hypoxia: Secondary | ICD-10-CM | POA: Diagnosis not present

## 2019-12-14 DIAGNOSIS — J984 Other disorders of lung: Secondary | ICD-10-CM | POA: Diagnosis not present

## 2019-12-14 DIAGNOSIS — E872 Acidosis: Secondary | ICD-10-CM | POA: Diagnosis not present

## 2019-12-14 DIAGNOSIS — R0603 Acute respiratory distress: Secondary | ICD-10-CM | POA: Diagnosis not present

## 2019-12-14 DIAGNOSIS — R918 Other nonspecific abnormal finding of lung field: Secondary | ICD-10-CM | POA: Diagnosis not present

## 2019-12-14 DIAGNOSIS — N179 Acute kidney failure, unspecified: Secondary | ICD-10-CM | POA: Diagnosis not present

## 2019-12-16 DIAGNOSIS — J9601 Acute respiratory failure with hypoxia: Secondary | ICD-10-CM | POA: Diagnosis not present

## 2019-12-16 DIAGNOSIS — N179 Acute kidney failure, unspecified: Secondary | ICD-10-CM | POA: Diagnosis not present

## 2019-12-16 DIAGNOSIS — J449 Chronic obstructive pulmonary disease, unspecified: Secondary | ICD-10-CM | POA: Diagnosis not present

## 2019-12-16 DIAGNOSIS — U071 COVID-19: Secondary | ICD-10-CM | POA: Diagnosis not present

## 2019-12-17 DIAGNOSIS — N179 Acute kidney failure, unspecified: Secondary | ICD-10-CM | POA: Diagnosis not present

## 2019-12-17 DIAGNOSIS — J189 Pneumonia, unspecified organism: Secondary | ICD-10-CM | POA: Diagnosis not present

## 2019-12-17 DIAGNOSIS — J449 Chronic obstructive pulmonary disease, unspecified: Secondary | ICD-10-CM | POA: Diagnosis not present

## 2019-12-17 DIAGNOSIS — U071 COVID-19: Secondary | ICD-10-CM | POA: Diagnosis not present

## 2019-12-17 DIAGNOSIS — J9601 Acute respiratory failure with hypoxia: Secondary | ICD-10-CM | POA: Diagnosis not present

## 2019-12-18 DIAGNOSIS — U071 COVID-19: Secondary | ICD-10-CM | POA: Diagnosis not present

## 2019-12-18 DIAGNOSIS — N179 Acute kidney failure, unspecified: Secondary | ICD-10-CM | POA: Diagnosis not present

## 2019-12-18 DIAGNOSIS — J449 Chronic obstructive pulmonary disease, unspecified: Secondary | ICD-10-CM | POA: Diagnosis not present

## 2019-12-18 DIAGNOSIS — J9601 Acute respiratory failure with hypoxia: Secondary | ICD-10-CM | POA: Diagnosis not present

## 2019-12-19 DIAGNOSIS — G9340 Encephalopathy, unspecified: Secondary | ICD-10-CM | POA: Diagnosis not present

## 2019-12-19 DIAGNOSIS — J449 Chronic obstructive pulmonary disease, unspecified: Secondary | ICD-10-CM | POA: Diagnosis not present

## 2019-12-19 DIAGNOSIS — J9601 Acute respiratory failure with hypoxia: Secondary | ICD-10-CM | POA: Diagnosis not present

## 2019-12-19 DIAGNOSIS — U071 COVID-19: Secondary | ICD-10-CM | POA: Diagnosis not present

## 2019-12-19 DIAGNOSIS — R569 Unspecified convulsions: Secondary | ICD-10-CM | POA: Diagnosis not present

## 2019-12-19 DIAGNOSIS — N179 Acute kidney failure, unspecified: Secondary | ICD-10-CM | POA: Diagnosis not present

## 2019-12-20 DIAGNOSIS — R569 Unspecified convulsions: Secondary | ICD-10-CM | POA: Diagnosis not present

## 2019-12-20 DIAGNOSIS — U071 COVID-19: Secondary | ICD-10-CM | POA: Diagnosis not present

## 2019-12-20 DIAGNOSIS — G9340 Encephalopathy, unspecified: Secondary | ICD-10-CM | POA: Diagnosis not present

## 2019-12-20 DIAGNOSIS — J9601 Acute respiratory failure with hypoxia: Secondary | ICD-10-CM | POA: Diagnosis not present

## 2019-12-20 DIAGNOSIS — J449 Chronic obstructive pulmonary disease, unspecified: Secondary | ICD-10-CM | POA: Diagnosis not present

## 2019-12-20 DIAGNOSIS — N179 Acute kidney failure, unspecified: Secondary | ICD-10-CM | POA: Diagnosis not present

## 2019-12-20 DIAGNOSIS — J189 Pneumonia, unspecified organism: Secondary | ICD-10-CM | POA: Diagnosis not present

## 2019-12-21 DIAGNOSIS — J9601 Acute respiratory failure with hypoxia: Secondary | ICD-10-CM | POA: Diagnosis not present

## 2019-12-21 DIAGNOSIS — U071 COVID-19: Secondary | ICD-10-CM | POA: Diagnosis not present

## 2019-12-21 DIAGNOSIS — N179 Acute kidney failure, unspecified: Secondary | ICD-10-CM | POA: Diagnosis not present

## 2019-12-21 DIAGNOSIS — R569 Unspecified convulsions: Secondary | ICD-10-CM | POA: Diagnosis not present

## 2019-12-21 DIAGNOSIS — J449 Chronic obstructive pulmonary disease, unspecified: Secondary | ICD-10-CM | POA: Diagnosis not present

## 2019-12-22 MED ORDER — FENTANYL CITRATE-NACL 2.5-0.9 MG/250ML-% IV SOLN
1.00 | INTRAVENOUS | Status: DC
Start: ? — End: 2019-12-22

## 2019-12-22 MED ORDER — MIDAZOLAM HCL 10 MG/10ML IJ SOLN
1.00 | INTRAMUSCULAR | Status: DC
Start: ? — End: 2019-12-22

## 2019-12-30 DEATH — deceased
